# Patient Record
Sex: Female | Born: 1935 | Race: White | Hispanic: No | Smoking: Never smoker
Health system: Southern US, Community
[De-identification: ages and names within clinical notes are randomized; demographics above are authoritative.]

## PROBLEM LIST (undated history)

## (undated) DIAGNOSIS — E079 Disorder of thyroid, unspecified: Secondary | ICD-10-CM

## (undated) DIAGNOSIS — Z950 Presence of cardiac pacemaker: Secondary | ICD-10-CM

## (undated) DIAGNOSIS — F319 Bipolar disorder, unspecified: Secondary | ICD-10-CM

## (undated) DIAGNOSIS — R7989 Other specified abnormal findings of blood chemistry: Secondary | ICD-10-CM

## (undated) DIAGNOSIS — F329 Major depressive disorder, single episode, unspecified: Secondary | ICD-10-CM

## (undated) DIAGNOSIS — J449 Chronic obstructive pulmonary disease, unspecified: Secondary | ICD-10-CM

## (undated) DIAGNOSIS — F419 Anxiety disorder, unspecified: Secondary | ICD-10-CM

## (undated) DIAGNOSIS — F32A Depression, unspecified: Secondary | ICD-10-CM

## (undated) HISTORY — PX: OTHER SURGICAL HISTORY: SHX169

## (undated) HISTORY — DX: Bipolar disorder, unspecified: F31.9

## (undated) HISTORY — DX: Presence of cardiac pacemaker: Z95.0

## (undated) HISTORY — DX: Other specified abnormal findings of blood chemistry: R79.89

---
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
## 2019-10-08 DIAGNOSIS — Z9289 Personal history of other medical treatment: Secondary | ICD-10-CM

## 2019-10-08 HISTORY — PX: MRI: SHX5353

## 2019-10-08 HISTORY — DX: Personal history of other medical treatment: Z92.89

## 2020-04-18 ENCOUNTER — Ambulatory Visit
Admission: RE | Admit: 2020-04-18 | Discharge: 2020-04-18 | Disposition: A | Discharge status: Home / Self Care | Payer: Medicare HMO | Source: Ambulatory Visit | Attending: Family Medicine | Admitting: Family Medicine

## 2020-04-18 ENCOUNTER — Other Ambulatory Visit: Payer: Self-pay | Admitting: Family Medicine

## 2020-04-18 ENCOUNTER — Other Ambulatory Visit: Payer: Self-pay

## 2020-04-18 ENCOUNTER — Other Ambulatory Visit (HOSPITAL_COMMUNITY): Payer: Self-pay | Admitting: Family Medicine

## 2020-04-18 DIAGNOSIS — R0600 Dyspnea, unspecified: Secondary | ICD-10-CM

## 2020-05-08 ENCOUNTER — Other Ambulatory Visit (HOSPITAL_COMMUNITY): Payer: Self-pay | Admitting: Family Medicine

## 2020-05-08 ENCOUNTER — Ambulatory Visit (HOSPITAL_COMMUNITY)
Admission: RE | Admit: 2020-05-08 | Discharge: 2020-05-08 | Disposition: A | Discharge status: Home / Self Care | Payer: Medicare HMO | Source: Ambulatory Visit | Attending: Cardiology | Admitting: Cardiology

## 2020-05-08 ENCOUNTER — Other Ambulatory Visit: Payer: Self-pay

## 2020-05-08 DIAGNOSIS — R7989 Other specified abnormal findings of blood chemistry: Secondary | ICD-10-CM | POA: Insufficient documentation

## 2020-05-08 DIAGNOSIS — R0602 Shortness of breath: Secondary | ICD-10-CM | POA: Insufficient documentation

## 2020-05-16 ENCOUNTER — Other Ambulatory Visit: Payer: Self-pay

## 2020-05-16 ENCOUNTER — Ambulatory Visit (HOSPITAL_COMMUNITY): Payer: Medicare HMO | Attending: Cardiology

## 2020-05-16 DIAGNOSIS — R0600 Dyspnea, unspecified: Secondary | ICD-10-CM

## 2020-05-16 LAB — ECHOCARDIOGRAM COMPLETE
Area-P 1/2: 3.91 cm2
P 1/2 time: 486 msec
S' Lateral: 2.8 cm

## 2020-05-19 ENCOUNTER — Emergency Department (HOSPITAL_COMMUNITY)
Admission: EM | Admit: 2020-05-19 | Discharge: 2020-05-19 | Disposition: A | Discharge status: Home / Self Care | Payer: Medicare HMO | Attending: Emergency Medicine | Admitting: Emergency Medicine

## 2020-05-19 ENCOUNTER — Other Ambulatory Visit: Payer: Self-pay

## 2020-05-19 ENCOUNTER — Emergency Department (HOSPITAL_COMMUNITY)
Admission: EM | Admit: 2020-05-19 | Discharge: 2020-05-19 | Disposition: A | Discharge status: Home / Self Care | Payer: Medicare HMO | Source: Home / Self Care

## 2020-05-19 ENCOUNTER — Encounter (HOSPITAL_COMMUNITY): Payer: Self-pay

## 2020-05-19 ENCOUNTER — Emergency Department (HOSPITAL_COMMUNITY): Payer: Medicare HMO

## 2020-05-19 DIAGNOSIS — R0602 Shortness of breath: Secondary | ICD-10-CM | POA: Insufficient documentation

## 2020-05-19 DIAGNOSIS — Z5321 Procedure and treatment not carried out due to patient leaving prior to being seen by health care provider: Secondary | ICD-10-CM | POA: Insufficient documentation

## 2020-05-19 HISTORY — DX: Depression, unspecified: F32.A

## 2020-05-19 HISTORY — DX: Anxiety disorder, unspecified: F41.9

## 2020-05-19 LAB — BASIC METABOLIC PANEL
Anion gap: 12 (ref 5–15)
BUN: 13 mg/dL (ref 8–23)
CO2: 19 mmol/L — ABNORMAL LOW (ref 22–32)
Calcium: 9 mg/dL (ref 8.9–10.3)
Chloride: 98 mmol/L (ref 98–111)
Creatinine, Ser: 0.76 mg/dL (ref 0.44–1.00)
GFR calc Af Amer: >60 mL/min (ref >60)
GFR calc non Af Amer: >60 mL/min (ref >60)
Glucose, Bld: 122 mg/dL — ABNORMAL HIGH (ref 70–99)
Potassium: 4.4 mmol/L (ref 3.5–5.1)
Sodium: 129 mmol/L — ABNORMAL LOW (ref 135–145)

## 2020-05-19 NOTE — ED Triage Notes (Signed)
Patient ac/o SOB x 2 months. Patient was at [facility] ED and left today. Patient states she has had tests completed by her PCP and they can not find out why she is SOB. Patient states SOB worse in the past 2 weeks.

## 2020-05-19 NOTE — ED Triage Notes (Signed)
Pt arrives POV for eval of shortness of breath x 2 months. Pt reports she called MD today, who advised her to come here. Pt reports PCP has run "a whole bunch of tests" and everything was normal. Pt reports she just continues to feel worse.

## 2020-06-08 ENCOUNTER — Ambulatory Visit (HOSPITAL_COMMUNITY)
Admission: EM | Admit: 2020-06-08 | Discharge: 2020-06-08 | Disposition: A | Discharge status: Home / Self Care | Payer: Medicare HMO | Attending: Family Medicine | Admitting: Family Medicine

## 2020-06-08 ENCOUNTER — Other Ambulatory Visit: Payer: Self-pay

## 2020-06-08 DIAGNOSIS — R0602 Shortness of breath: Secondary | ICD-10-CM

## 2020-06-08 MED ORDER — PREDNISONE 10 MG PO TABS
20.0000 mg | ORAL_TABLET | Freq: Every day | ORAL | 0 refills | Status: AC
Start: 2020-06-08 — End: 2020-06-13

## 2020-06-08 MED ORDER — PREDNISONE 10 MG PO TABS
20.0000 mg | ORAL_TABLET | Freq: Every day | ORAL | 0 refills | Status: DC
Start: 2020-06-08 — End: 2020-06-08

## 2020-06-08 MED ORDER — ALBUTEROL SULFATE HFA 108 (90 BASE) MCG/ACT IN AERS: 1.0000 | INHALATION_SPRAY | Freq: Four times a day (QID) | RESPIRATORY_TRACT | 0 refills | Status: DC | PRN

## 2020-06-08 NOTE — ED Triage Notes (Signed)
Pt presents with complaints of shortness of breath x 2 months. States she is fatigued and having difficulty sleeping at night. Shortness of breath is worse with ambulation. Reports she has seen her provider and has had a chest x ray, breathing treatments, and checked for blood clots with no improvement. Pt denies any pain.

## 2020-06-08 NOTE — Discharge Instructions (Addendum)
Medication as prescribed  Follow up next week for CT scan

## 2020-06-10 NOTE — ED Provider Notes (Signed)
[facility]    CSN: [id number] Arrival date & time: 06/08/20  1258      History   Chief Complaint Chief Complaint  Patient presents with  . Shortness of Breath    HPI Sheila Mccoy is a 86 y.o. female.   Pt is a 86 year old female that presents today with continued shortness of breath this is been ongoing issue for approximate 2 months.  Feels like the problem is worsening.  She has had work-up to include echocardiogram, EKG, pulmonary function test and is currently seeing pulmonologist.  She has been given albuterol and steroids which seems to help some.  She is scheduled for CT angio to rule out PE next week.  Denies any chest pain, cough or fevers.  Currently taking Lasix daily which has improved her lower extremity edema.  Currently without swelling.  No calf pain or swelling.  Also has chronic DVT     Past Medical History:  Diagnosis Date  . Anxiety   . Depression     There are no problems to display for this patient.   Past Surgical History:  Procedure Laterality Date  . arm fracture surgery      OB History   No obstetric history on file.      Home Medications    Prior to Admission medications   Medication Sig Start Date End Date Taking? Authorizing Provider  albuterol (VENTOLIN HFA) 108 (90 Base) MCG/ACT inhaler Inhale 1-2 puffs into the lungs every 6 (six) hours as needed for wheezing or shortness of breath. 06/08/20   Dahlia Byes A, NP  predniSONE (DELTASONE) 10 MG tablet Take 2 tablets (20 mg total) by mouth daily for 5 days. 06/08/20 06/13/20  Janace Aris, NP    Family History No family history on file.  Social History Social History   Tobacco Use  . Smoking status: Never Smoker  . Smokeless tobacco: Never Used  Vaping Use  . Vaping Use: Never used  Substance Use Topics  . Alcohol use: Never  . Drug use: Never     Allergies   Patient has no known allergies.   Review of Systems Review of Systems   Physical Exam Triage Vital  Signs ED Triage Vitals  Enc Vitals Group     BP 06/08/20 1303 (!) 119/91     Pulse Rate 06/08/20 1303 60     Resp 06/08/20 1303 18     Temp 06/08/20 1303 98.1 F (36.7 C)     Temp src --      SpO2 06/08/20 1303 96 %     Weight --      Height --      Head Circumference --      Peak Flow --      Pain Score 06/08/20 1423 0     Pain Loc --      Pain Edu? --      Excl. in GC? --    No data found.  Updated Vital Signs BP (!) 119/91   Pulse 60   Temp 98.1 F (36.7 C)   Resp 18   SpO2 96%   Visual Acuity Right Eye Distance:   Left Eye Distance:   Bilateral Distance:    Right Eye Near:   Left Eye Near:    Bilateral Near:     Physical Exam Vitals and nursing note reviewed.  Constitutional:      General: She is not in acute distress.    Appearance: Normal  appearance. She is not ill-appearing, toxic-appearing or diaphoretic.  HENT:     Head: Normocephalic.     Nose: Nose normal.     Mouth/Throat:     Pharynx: Oropharynx is clear.  Eyes:     Conjunctiva/sclera: Conjunctivae normal.  Cardiovascular:     Rate and Rhythm: Normal rate and regular rhythm.  Pulmonary:     Effort: Pulmonary effort is normal.     Breath sounds: Normal breath sounds.  Musculoskeletal:        General: Normal range of motion.     Cervical back: Normal range of motion.  Skin:    General: Skin is warm and dry.     Findings: No rash.  Neurological:     Mental Status: She is alert.  Psychiatric:        Mood and Affect: Mood normal.      UC Treatments / Results  Labs (all labs ordered are listed, but only abnormal results are displayed) Labs Reviewed - No data to display  EKG   Radiology No results found.  Procedures Procedures (including critical care time)  Medications Ordered in UC Medications - No data to display  Initial Impression / Assessment and Plan / UC Course  I have reviewed the triage vital signs and the nursing notes.  Pertinent labs & imaging results that  were available during my care of the patient were reviewed by me and considered in my medical decision making (see chart for details).     SOB This is chronic and she is currently being worked up for this.  Previous lab work reviewed and revealed mildly elevated white blood cell count of 12 with elevated monocyte and eosinophil Not concerned of any bacterial infection or pneumonia at this time. Previous chest x-ray with pleural effusion.  No crackles on exam. She is currently taking Lasix which seems to help. Patient has plans next week to have CT angio of chest.  Otherwise oxygen saturations are normal and she is not tachycardic at this time. Does not appear to be in any acute distress We will trial round of steroids since this seem to help her previously.  Albuterol inhaler as needed. Recommend follow-up next week as planned Final Clinical Impressions(s) / UC Diagnoses   Final diagnoses:  SOB (shortness of breath)     Discharge Instructions     Medication as prescribed  Follow up next week for CT scan       ED Prescriptions    Medication Sig Dispense Auth. Provider   predniSONE (DELTASONE) 10 MG tablet  (Status: Discontinued) Take 2 tablets (20 mg total) by mouth daily for 5 days. 10 tablet Arbell Wycoff A, NP   albuterol (VENTOLIN HFA) 108 (90 Base) MCG/ACT inhaler  (Status: Discontinued) Inhale 1-2 puffs into the lungs every 6 (six) hours as needed for wheezing or shortness of breath. 1 each Tayon Parekh A, NP   albuterol (VENTOLIN HFA) 108 (90 Base) MCG/ACT inhaler Inhale 1-2 puffs into the lungs every 6 (six) hours as needed for wheezing or shortness of breath. 1 each Maiana Hennigan A, NP   predniSONE (DELTASONE) 10 MG tablet Take 2 tablets (20 mg total) by mouth daily for 5 days. 10 tablet Dahlia Byes A, NP     PDMP not reviewed this encounter.   Dahlia Byes A, NP 06/10/20 1409

## 2020-06-26 ENCOUNTER — Other Ambulatory Visit: Payer: Self-pay | Admitting: Family Medicine

## 2020-06-26 ENCOUNTER — Institutional Professional Consult (permissible substitution): Payer: Medicare HMO | Admitting: Pulmonary Disease

## 2020-06-26 ENCOUNTER — Ambulatory Visit
Admission: RE | Admit: 2020-06-26 | Discharge: 2020-06-26 | Disposition: A | Discharge status: Home / Self Care | Payer: Medicare HMO | Source: Ambulatory Visit | Attending: Family Medicine | Admitting: Family Medicine

## 2020-06-26 DIAGNOSIS — M79604 Pain in right leg: Secondary | ICD-10-CM

## 2020-06-27 ENCOUNTER — Encounter (HOSPITAL_COMMUNITY): Payer: Self-pay | Admitting: *Deleted

## 2020-06-27 NOTE — Progress Notes (Signed)
Received referral notification  from Dr. Avie Echevaria for this pt to participate in pulmonary rehab with the the diagnosis of shortness of breath. Clinical review of pt follow up appt on 8/25  Pulmonary office note.  Pt with Covid Risk Score - 3. Pt appropriate for scheduling for Pulmonary rehab.  Will forward to support staff for scheduling and verification of insurance eligibility/benefits with pt consent. Alanson Aly, BSN Cardiac and Emergency planning/management officer

## 2020-07-04 ENCOUNTER — Other Ambulatory Visit: Payer: Self-pay | Admitting: Family Medicine

## 2020-07-04 DIAGNOSIS — M25551 Pain in right hip: Secondary | ICD-10-CM

## 2020-07-04 DIAGNOSIS — M7918 Myalgia, other site: Secondary | ICD-10-CM

## 2020-07-06 ENCOUNTER — Telehealth (HOSPITAL_COMMUNITY): Payer: Self-pay

## 2020-07-06 NOTE — Telephone Encounter (Signed)
Pt daughter Elenora Fender called and stated pt is interested in PR. Patient will come in for orientation on 08/04/20 @ 130PM and will attend the 10:30AM exercise class. Went over [employer], [name] verbalized understanding.   Mailed letter

## 2020-07-07 ENCOUNTER — Other Ambulatory Visit: Payer: Self-pay | Admitting: Family Medicine

## 2020-07-07 DIAGNOSIS — M545 Low back pain, unspecified: Secondary | ICD-10-CM

## 2020-07-09 ENCOUNTER — Emergency Department (HOSPITAL_BASED_OUTPATIENT_CLINIC_OR_DEPARTMENT_OTHER): Payer: Medicare HMO

## 2020-07-09 ENCOUNTER — Inpatient Hospital Stay (HOSPITAL_BASED_OUTPATIENT_CLINIC_OR_DEPARTMENT_OTHER)
Admission: EM | Admit: 2020-07-09 | Discharge: 2020-07-13 | DRG: 551 | Disposition: A | Discharge status: Skilled Nursing Facility | Payer: Medicare HMO | Attending: Internal Medicine | Admitting: Internal Medicine

## 2020-07-09 ENCOUNTER — Other Ambulatory Visit: Payer: Self-pay

## 2020-07-09 ENCOUNTER — Observation Stay (HOSPITAL_COMMUNITY): Payer: Medicare HMO

## 2020-07-09 ENCOUNTER — Encounter (HOSPITAL_BASED_OUTPATIENT_CLINIC_OR_DEPARTMENT_OTHER): Payer: Self-pay | Admitting: Emergency Medicine

## 2020-07-09 DIAGNOSIS — J90 Pleural effusion, not elsewhere classified: Secondary | ICD-10-CM | POA: Diagnosis not present

## 2020-07-09 DIAGNOSIS — Y9301 Activity, walking, marching and hiking: Secondary | ICD-10-CM | POA: Diagnosis not present

## 2020-07-09 DIAGNOSIS — M25561 Pain in right knee: Secondary | ICD-10-CM

## 2020-07-09 DIAGNOSIS — K5730 Diverticulosis of large intestine without perforation or abscess without bleeding: Secondary | ICD-10-CM | POA: Diagnosis present

## 2020-07-09 DIAGNOSIS — M25562 Pain in left knee: Secondary | ICD-10-CM

## 2020-07-09 DIAGNOSIS — E860 Dehydration: Secondary | ICD-10-CM | POA: Diagnosis not present

## 2020-07-09 DIAGNOSIS — Z7952 Long term (current) use of systemic steroids: Secondary | ICD-10-CM | POA: Diagnosis not present

## 2020-07-09 DIAGNOSIS — N3000 Acute cystitis without hematuria: Secondary | ICD-10-CM | POA: Diagnosis not present

## 2020-07-09 DIAGNOSIS — R0600 Dyspnea, unspecified: Secondary | ICD-10-CM

## 2020-07-09 DIAGNOSIS — Z66 Do not resuscitate: Secondary | ICD-10-CM | POA: Diagnosis present

## 2020-07-09 DIAGNOSIS — W010XXA Fall on same level from slipping, tripping and stumbling without subsequent striking against object, initial encounter: Secondary | ICD-10-CM | POA: Diagnosis present

## 2020-07-09 DIAGNOSIS — F32A Depression, unspecified: Secondary | ICD-10-CM | POA: Diagnosis not present

## 2020-07-09 DIAGNOSIS — N83201 Unspecified ovarian cyst, right side: Secondary | ICD-10-CM | POA: Diagnosis not present

## 2020-07-09 DIAGNOSIS — E039 Hypothyroidism, unspecified: Secondary | ICD-10-CM | POA: Diagnosis present

## 2020-07-09 DIAGNOSIS — S3210XA Unspecified fracture of sacrum, initial encounter for closed fracture: Principal | ICD-10-CM | POA: Diagnosis present

## 2020-07-09 DIAGNOSIS — Z7982 Long term (current) use of aspirin: Secondary | ICD-10-CM | POA: Diagnosis not present

## 2020-07-09 DIAGNOSIS — M25552 Pain in left hip: Secondary | ICD-10-CM | POA: Diagnosis not present

## 2020-07-09 DIAGNOSIS — F419 Anxiety disorder, unspecified: Secondary | ICD-10-CM | POA: Diagnosis not present

## 2020-07-09 DIAGNOSIS — I82503 Chronic embolism and thrombosis of unspecified deep veins of lower extremity, bilateral: Secondary | ICD-10-CM | POA: Diagnosis not present

## 2020-07-09 DIAGNOSIS — G9341 Metabolic encephalopathy: Secondary | ICD-10-CM

## 2020-07-09 DIAGNOSIS — Z20822 Contact with and (suspected) exposure to covid-19: Secondary | ICD-10-CM | POA: Diagnosis present

## 2020-07-09 DIAGNOSIS — N390 Urinary tract infection, site not specified: Secondary | ICD-10-CM

## 2020-07-09 DIAGNOSIS — E440 Moderate protein-calorie malnutrition: Secondary | ICD-10-CM | POA: Diagnosis not present

## 2020-07-09 DIAGNOSIS — Z79890 Hormone replacement therapy (postmenopausal): Secondary | ICD-10-CM | POA: Diagnosis not present

## 2020-07-09 DIAGNOSIS — N949 Unspecified condition associated with female genital organs and menstrual cycle: Secondary | ICD-10-CM

## 2020-07-09 DIAGNOSIS — Z79899 Other long term (current) drug therapy: Secondary | ICD-10-CM

## 2020-07-09 DIAGNOSIS — Z888 Allergy status to other drugs, medicaments and biological substances status: Secondary | ICD-10-CM | POA: Diagnosis not present

## 2020-07-09 DIAGNOSIS — J449 Chronic obstructive pulmonary disease, unspecified: Secondary | ICD-10-CM

## 2020-07-09 HISTORY — DX: Metabolic encephalopathy: G93.41

## 2020-07-09 HISTORY — DX: Unspecified condition associated with female genital organs and menstrual cycle: N94.9

## 2020-07-09 HISTORY — DX: Unspecified fracture of sacrum, initial encounter for closed fracture: S32.10XA

## 2020-07-09 HISTORY — DX: Disorder of thyroid, unspecified: E07.9

## 2020-07-09 HISTORY — DX: Urinary tract infection, site not specified: N39.0

## 2020-07-09 LAB — URINALYSIS, MICROSCOPIC (REFLEX): WBC, UA: >50 WBC/hpf (ref 0–5)

## 2020-07-09 LAB — URINALYSIS, ROUTINE W REFLEX MICROSCOPIC
Bilirubin Urine: NEGATIVE
Glucose, UA: NEGATIVE mg/dL
Ketones, ur: NEGATIVE mg/dL
Nitrite: NEGATIVE
Protein, ur: NEGATIVE mg/dL
Specific Gravity, Urine: 1.01 (ref 1.005–1.030)
pH: 6 (ref 5.0–8.0)

## 2020-07-09 LAB — BASIC METABOLIC PANEL
Anion gap: 11 (ref 5–15)
BUN: 20 mg/dL (ref 8–23)
CO2: 24 mmol/L (ref 22–32)
Calcium: 9.4 mg/dL (ref 8.9–10.3)
Chloride: 99 mmol/L (ref 98–111)
Creatinine, Ser: 0.9 mg/dL (ref 0.44–1.00)
GFR calc Af Amer: >60 mL/min (ref >60)
GFR calc non Af Amer: 57 mL/min — ABNORMAL LOW (ref >60)
Glucose, Bld: 109 mg/dL — ABNORMAL HIGH (ref 70–99)
Potassium: 3.5 mmol/L (ref 3.5–5.1)
Sodium: 134 mmol/L — ABNORMAL LOW (ref 135–145)

## 2020-07-09 LAB — CBC
HCT: 42 % (ref 36.0–46.0)
Hemoglobin: 13.9 g/dL (ref 12.0–15.0)
MCH: 29.9 pg (ref 26.0–34.0)
MCHC: 33.1 g/dL (ref 30.0–36.0)
MCV: 90.3 fL (ref 80.0–100.0)
Platelets: 392 10*3/uL (ref 150–400)
RBC: 4.65 MIL/uL (ref 3.87–5.11)
RDW: 14.8 % (ref 11.5–15.5)
WBC: 9.9 10*3/uL (ref 4.0–10.5)
nRBC: 0 % (ref 0.0–0.2)

## 2020-07-09 LAB — RESPIRATORY PANEL BY RT PCR (FLU A&B, COVID)
Influenza A by PCR: NEGATIVE
Influenza B by PCR: NEGATIVE
SARS Coronavirus 2 by RT PCR: NEGATIVE

## 2020-07-09 MED ORDER — MORPHINE SULFATE (PF) 4 MG/ML IV SOLN
4.0000 mg | Freq: Once | INTRAVENOUS | Status: AC
  Administered 2020-07-09: 4 mg via INTRAVENOUS
  Filled 2020-07-09: qty 1

## 2020-07-09 MED ORDER — METHOCARBAMOL 1000 MG/10ML IJ SOLN
500.0000 mg | Freq: Four times a day (QID) | INTRAVENOUS | Status: DC | PRN
  Filled 2020-07-09: qty 5

## 2020-07-09 MED ORDER — SODIUM CHLORIDE 0.9 % IV SOLN: Freq: Once | INTRAVENOUS | Status: AC

## 2020-07-09 MED ORDER — ENOXAPARIN SODIUM 40 MG/0.4ML ~~LOC~~ SOLN
40.0000 mg | SUBCUTANEOUS | Status: DC
  Administered 2020-07-09 – 2020-07-12 (×4): 40 mg via SUBCUTANEOUS
  Filled 2020-07-09 (×4): qty 0.4

## 2020-07-09 MED ORDER — LEVOTHYROXINE SODIUM 75 MCG PO TABS
75.0000 ug | ORAL_TABLET | Freq: Every day | ORAL | Status: DC
  Administered 2020-07-10 – 2020-07-13 (×4): 75 ug via ORAL
  Filled 2020-07-09 (×4): qty 1

## 2020-07-09 MED ORDER — ONDANSETRON HCL 4 MG/2ML IJ SOLN
4.0000 mg | Freq: Once | INTRAMUSCULAR | Status: AC
  Administered 2020-07-09: 4 mg via INTRAVENOUS
  Filled 2020-07-09: qty 2

## 2020-07-09 MED ORDER — HYDROMORPHONE HCL 1 MG/ML IJ SOLN
0.5000 mg | Freq: Once | INTRAMUSCULAR | Status: AC
  Administered 2020-07-09: 0.5 mg via INTRAVENOUS
  Filled 2020-07-09: qty 1

## 2020-07-09 MED ORDER — IOHEXOL 350 MG/ML SOLN
100.0000 mL | Freq: Once | INTRAVENOUS | Status: AC | PRN
  Administered 2020-07-09: 100 mL via INTRAVENOUS

## 2020-07-09 MED ORDER — SODIUM CHLORIDE 0.9 % IV SOLN
1.0000 g | Freq: Once | INTRAVENOUS | Status: AC
  Administered 2020-07-09: 1 g via INTRAVENOUS
  Filled 2020-07-09: qty 10

## 2020-07-09 MED ORDER — HYDROCODONE-ACETAMINOPHEN 5-325 MG PO TABS
1.0000 | ORAL_TABLET | Freq: Four times a day (QID) | ORAL | Status: DC | PRN
  Administered 2020-07-10: 2 via ORAL
  Filled 2020-07-09 (×2): qty 1

## 2020-07-09 MED ORDER — MORPHINE SULFATE (PF) 2 MG/ML IV SOLN
1.0000 mg | INTRAVENOUS | Status: DC | PRN
  Administered 2020-07-10 – 2020-07-11 (×3): 1 mg via INTRAVENOUS
  Filled 2020-07-09 (×3): qty 1

## 2020-07-09 MED ORDER — SODIUM CHLORIDE 0.9 % IV SOLN: INTRAVENOUS | Status: AC

## 2020-07-09 MED ORDER — SODIUM CHLORIDE 0.9 % IV SOLN
1.0000 g | INTRAVENOUS | Status: DC
  Administered 2020-07-10 – 2020-07-12 (×3): 1 g via INTRAVENOUS
  Filled 2020-07-09: qty 10
  Filled 2020-07-09 (×2): qty 1
  Filled 2020-07-09: qty 10

## 2020-07-09 MED ORDER — ALBUTEROL SULFATE HFA 108 (90 BASE) MCG/ACT IN AERS
1.0000 | INHALATION_SPRAY | Freq: Four times a day (QID) | RESPIRATORY_TRACT | Status: DC | PRN
  Administered 2020-07-10: 1 via RESPIRATORY_TRACT
  Administered 2020-07-10 – 2020-07-11 (×3): 2 via RESPIRATORY_TRACT
  Filled 2020-07-09 (×3): qty 6.7

## 2020-07-09 MED ORDER — METHOCARBAMOL 500 MG PO TABS
500.0000 mg | ORAL_TABLET | Freq: Four times a day (QID) | ORAL | Status: DC | PRN
  Administered 2020-07-10 (×3): 500 mg via ORAL
  Filled 2020-07-09 (×3): qty 1

## 2020-07-09 NOTE — ED Triage Notes (Signed)
Low back and pelvis pain after falling 1 month ago. Pt family states she is spending more and more time in bed and not wanting to move. She states when she tries to get her up she is too weak to walk.

## 2020-07-09 NOTE — ED Notes (Signed)
Spoke with Carol in lab to add on urine culture 

## 2020-07-09 NOTE — ED Notes (Addendum)
Notify radiology when pt done on bed pan

## 2020-07-09 NOTE — ED Notes (Signed)
Pt has need to void, placed on bedpan

## 2020-07-09 NOTE — ED Notes (Signed)
Pt fell at home approx 1 month ago per her family statement, states is very concerned since she is laying in bed mostly and will not get up and ambulate, c/o being weak all the time.

## 2020-07-09 NOTE — ED Notes (Signed)
COVID swab obtained.

## 2020-07-09 NOTE — ED Notes (Addendum)
Pt placed on bedpan, placed in gown, will use call bell when done.

## 2020-07-09 NOTE — ED Notes (Signed)
2 unsuccessful IV attempts, L hand and R hand. RT Brett Canales will attempt

## 2020-07-09 NOTE — ED Notes (Signed)
Patient transported to CT 

## 2020-07-09 NOTE — Progress Notes (Signed)
Pt arrived from [place]. Alert and responsive. Pt yellow MEWS from [place], pt is MEWS green during admission.

## 2020-07-09 NOTE — ED Notes (Signed)
Urine culture sent to lab also.  

## 2020-07-09 NOTE — ED Notes (Signed)
PT BECAME VERY SLEEPY AND CONFUSED, AFTER REC IV DILAUDID, 0.5MG , PT WAS CONFUSED, POX HAD DECREASED TO 86% ON RA, PLACED ON 2LPM VIA Agency, POX HAS NOW INCREASED TO 99-100%. PT WAS REASSURED, ORIENTATION STATUS ASSESSED, WAS ABLE TO STATE NAME, LOCATION AND TIME OF DAY, SAFETY MEASURES FOR HIGH FALL RISK REMAIN IN PLACE, CALL BELL WITHIN REACH, FAMILY AT BEDSIDE. REMAINS ON CONT CARDIAC MONITORING WITH CONT POX.

## 2020-07-09 NOTE — ED Provider Notes (Signed)
[facility] EMERGENCY DEPARTMENT Provider Note   CSN: [id number] Arrival date & time: 07/09/20  1225     History Chief Complaint  Patient presents with  . Back Pain  . Weakness    Noel Journeyleanor Nuckles is a 86 y.o. female with past medical history significant for anxiety, depression, thyroid disease, COPD, chronic DVT.  Not anticoagulated.  Had Covid vaccinations. she is accompanied by her daughter who is contributing historian.  HPI Presents to emergency department today with chief complaint of low back pain and pelvis pain x1 month.  Patient states she had a mechanical fall on 06/13/2020.  She was walking and tripped on her Rollator.  She fell and landed on her left hip.  She had bruising and swelling of her hip immediately after the fall.  Ever since the fall she has had severe pain in her left hip.  The pain is constant.  She describes it as aching.  She rates the pain 8 out of 10 in severity.  She has tried over-the-counter medications without any symptom improvement.  She has seen her primary care doctor and had x-rays of her low back that were negative for fractures.  She was prescribed tramadol which she took for 10 days.  While taking the tramadol she was constipated.  She stopped taking it recently and has since been having diarrhea.  No blood in her stool.  2 episodes of diarrhea in the last 24 hours.  No recent antibiotic use, no recent travel or suspicious food intake. Patient admits to urinary frequency and daughter reports mild confusion today which is new. Patient's daughter states that patient has had difficulty walking over the last x3 days.  She has been trying to ambulate with a Rollator however has been unable to because she is so weak and dizzy. She describes the dizziness as the room is spinning.  This morning patient noted to ambulate and was feeling so weak she had to sit back down.  They attempted again to ambulate her and patient was off balance and was pushing her  Rollator into the wall which family says is unusual. Patient is also endorsing shortness of breath x 3 months. She states she is on multiple inhalers and not having significant improvement.  Her shortness of breath has been going on times PA chest on 06/14/2020 was negative for PE, she was noted to have emphysematous changes and fibrosis in the lungs. She denies any fever, chills, cough, hemoptysis, chest pain, abdominal pain, nausea, urinary symptoms, lower extremity edema, numbness, weakness, tingling. Patient lives in a nursing facility.  Her daughter has been staying with her for the last several days as patient is unable to care for herself.    Past Medical History:  Diagnosis Date  . Anxiety   . Depression   . Thyroid disease     Patient Active Problem List   Diagnosis Date Noted  . UTI (urinary tract infection) 07/09/2020    Past Surgical History:  Procedure Laterality Date  . arm fracture surgery       OB History   No obstetric history on file.     No family history on file.  Social History   Tobacco Use  . Smoking status: Never Smoker  . Smokeless tobacco: Never Used  Vaping Use  . Vaping Use: Never used  Substance Use Topics  . Alcohol use: Never  . Drug use: Never    Home Medications Prior to Admission medications   Medication Sig Start Date  End Date Taking? Authorizing Provider  risperiDONE (RISPERDAL) 0.5 MG tablet  05/15/20  Yes [provider]  albuterol (VENTOLIN HFA) 108 (90 Base) MCG/ACT inhaler Inhale 1-2 puffs into the lungs every 6 (six) hours as needed for wheezing or shortness of breath. 06/08/20   Dahlia Byes A, NP  buPROPion (WELLBUTRIN) 75 MG tablet Take by mouth. 06/13/20   [provider]  furosemide (LASIX) 20 MG tablet Take 20 mg by mouth daily. 05/31/20   [provider]  levothyroxine (SYNTHROID) 75 MCG tablet Take by mouth. 06/13/20   [provider]  mirtazapine (REMERON) 15 MG tablet Take by mouth. 06/13/20    [provider]    Allergies    Patient has no known allergies.  Review of Systems   Review of Systems  All other systems are reviewed and are negative for acute change except as noted in the HPI.   Physical Exam Updated Vital Signs BP (!) 106/91 (BP Location: Left Arm)   Pulse 68   Temp 97.7 F (36.5 C) (Oral)   Resp (!) 24   Ht 5\' 3"  (1.6 m)   Wt 63.5 kg   SpO2 93%   BMI 24.80 kg/m   Physical Exam Vitals and nursing note reviewed.  Constitutional:      General: She is not in acute distress.    Appearance: She is not ill-appearing.  HENT:     Head: Normocephalic and atraumatic.     Right Ear: Tympanic membrane and external ear normal.     Left Ear: Tympanic membrane and external ear normal.     Nose: Nose normal.     Mouth/Throat:     Mouth: Mucous membranes are moist.     Pharynx: Oropharynx is clear.  Eyes:     General: No scleral icterus.       Right eye: No discharge.        Left eye: No discharge.     Extraocular Movements: Extraocular movements intact.     Conjunctiva/sclera: Conjunctivae normal.     Pupils: Pupils are equal, round, and reactive to light.  Neck:     Vascular: No JVD.  Cardiovascular:     Rate and Rhythm: Normal rate and regular rhythm.     Pulses: Normal pulses.          Radial pulses are 2+ on the right side and 2+ on the left side.     Heart sounds: Normal heart sounds.  Pulmonary:     Comments: Lungs clear to auscultation in all fields. Symmetric chest rise. No wheezing, rales, or rhonchi.  Oxygen saturation is 93% on room air. Abdominal:     Comments: Abdomen is soft, non-distended, and non-tender in all quadrants. No rigidity, no guarding. No peritoneal signs.  Musculoskeletal:     Cervical back: Normal range of motion.     Right lower leg: No edema.     Left lower leg: No edema.     Comments: Tenderness to palpation of left hip.  On deep palpation there is a not felt.  No overlying skin changes, no ecchymosis.   Decreased range of motion of left hip secondary to pain.  Full range of motion of bilateral knees and ankles.  Compartments in left lower extremity are soft.  Pelvis is stable.  No leg length discrepancies.   Skin:    General: Skin is warm and dry.     Capillary Refill: Capillary refill takes less than 2 seconds.  Neurological:  Mental Status: She is oriented to person, place, and time.     GCS: GCS eye subscore is 4. GCS verbal subscore is 5. GCS motor subscore is 6.     Comments: Speech is clear and goal oriented, follows commands CN III-XII intact, no facial droop Normal strength in upper and lower extremities bilaterally including dorsiflexion and plantar flexion, strong and equal grip strength Sensation normal to light and sharp touch Moves extremities without ataxia, coordination intact Normal finger to nose and rapid alternating movements  Unable to ambulate s/2 weakness and pain per patient   Psychiatric:        Behavior: Behavior normal.     ED Results / Procedures / Treatments   Labs (all labs ordered are listed, but only abnormal results are displayed) Labs Reviewed  BASIC METABOLIC PANEL - Abnormal; Notable for the following components:      Result Value   Sodium 134 (*)    Glucose, Bld 109 (*)    GFR calc non Af Amer 57 (*)    All other components within normal limits  URINALYSIS, ROUTINE W REFLEX MICROSCOPIC - Abnormal; Notable for the following components:   Color, Urine STRAW (*)    APPearance CLOUDY (*)    Hgb urine dipstick TRACE (*)    Leukocytes,Ua LARGE (*)    All other components within normal limits  URINALYSIS, MICROSCOPIC (REFLEX) - Abnormal; Notable for the following components:   Bacteria, UA MANY (*)    All other components within normal limits  RESPIRATORY PANEL BY RT PCR (FLU A&B, COVID)  URINE CULTURE  CBC    EKG None  Radiology CT Head Wo Contrast  Result Date: 07/09/2020 CLINICAL DATA:  Status post fall 1 month ago. EXAM: CT HEAD  WITHOUT CONTRAST TECHNIQUE: Contiguous axial images were obtained from the base of the skull through the vertex without intravenous contrast. COMPARISON:  None. FINDINGS: Brain: There is mild cerebral atrophy with widening of the extra-axial spaces and ventricular dilatation. There are areas of decreased attenuation within the white matter tracts of the supratentorial brain, consistent with microvascular disease changes. Vascular: No hyperdense vessel or unexpected calcification. Skull: Normal. Negative for fracture or focal lesion. Sinuses/Orbits: No acute finding. Other: None. IMPRESSION: 1. Generalized cerebral atrophy. 2. No acute intracranial abnormality. Electronically Signed   By: Aram Candela M.D.   On: 07/09/2020 16:03   CT Lumbar Spine Wo Contrast  Result Date: 07/09/2020 CLINICAL DATA:  Status post fall 1 month ago. EXAM: CT LUMBAR SPINE WITHOUT CONTRAST TECHNIQUE: Multidetector CT imaging of the lumbar spine was performed without intravenous contrast administration. Multiplanar CT image reconstructions were also generated. COMPARISON:  None. FINDINGS: Segmentation: 5 lumbar type vertebrae. Alignment: There is moderate severity levoscoliosis. Vertebrae: A chronic compression fracture deformity is seen involving predominantly the inferior endplate of the L1 vertebral body. Paraspinal and other soft tissues: Negative. Disc levels: Moderate severity multilevel endplate sclerosis is seen throughout the lumbar spine. Moderate severity multilevel intervertebral disc space narrowing is seen with associated vacuum disc phenomenon noted at the levels of L1-L2, L2-L3, L3-L4 and L4-L5. Mild to moderate severity bilateral multilevel facet joint hypertrophy is noted. IMPRESSION: 1. Chronic compression fracture deformity involving predominantly the inferior endplate of the L1 vertebral body. 2. Moderate severity levoscoliosis. 3. Moderate to marked severity multilevel degenerative disc disease. Electronically  Signed   By: Aram Candela M.D.   On: 07/09/2020 16:13   CT PELVIS WO CONTRAST  Result Date: 07/09/2020 CLINICAL DATA:  Status post fall  1 month ago. EXAM: CT PELVIS WITHOUT CONTRAST TECHNIQUE: Multidetector CT imaging of the pelvis was performed following the standard protocol without intravenous contrast. COMPARISON:  None. FINDINGS: Urinary Tract:  The urinary bladder is markedly distended. Bowel: There is no evidence of bowel dilatation. Noninflamed diverticula are seen throughout the large bowel. Vascular/Lymphatic: There is moderate severity calcification of the abdominal aorta and bilateral common iliac arteries, without evidence of aneurysmal dilatation. Reproductive: The uterus is normal in size and appearance. A 7.2 cm x 3.3 cm cystic appearing area is seen within the right adnexa. Mass effect is seen on the uterus. Other:  None. Musculoskeletal: An acute mildly displaced fracture is seen involving the inferior aspect of the sacrum, at the approximate level of S5. IMPRESSION: 1. Acute fracture involving the inferior aspect of the sacrum, at the approximate level of S5. 2. Large right adnexal cyst, likely ovarian in origin. Further evaluation with pelvic ultrasound is recommended. 3. Colonic diverticulosis. Aortic Atherosclerosis (ICD10-I70.0). Electronically Signed   By: Aram Candela M.D.   On: 07/09/2020 16:09   DG Chest Portable 1 View  Result Date: 07/09/2020 CLINICAL DATA:  Shortness of breath. EXAM: PORTABLE CHEST 1 VIEW COMPARISON:  May 19, 2020. FINDINGS: The heart size and mediastinal contours are within normal limits. No pneumothorax is noted. Right lung is clear. Mild left basilar subsegmental atelectasis and left pleural effusion is noted. The visualized skeletal structures are unremarkable. IMPRESSION: Mild left basilar subsegmental atelectasis and left pleural effusion. Electronically Signed   By: Lupita Raider M.D.   On: 07/09/2020 15:46    Procedures Procedures  (including critical care time)  Medications Ordered in ED Medications  cefTRIAXone (ROCEPHIN) 1 g in sodium chloride 0.9 % 100 mL IVPB (1 g Intravenous New Bag/Given 07/09/20 1724)  0.9 %  sodium chloride infusion (has no administration in time range)  morphine 4 MG/ML injection 4 mg (4 mg Intravenous Given 07/09/20 1721)  ondansetron (ZOFRAN) injection 4 mg (4 mg Intravenous Given 07/09/20 1720)    ED Course  I have reviewed the triage vital signs and the nursing notes.  Pertinent labs & imaging results that were available during my care of the patient were reviewed by me and considered in my medical decision making (see chart for details).    MDM Rules/Calculators/A&P                          History provided by patient with additional history obtained from chart review.    48 old female presenting with progressive weakness after fall x1 month ago and shortness of breath x3 months.  Patient is afebrile, normotensive, no tachycardia or hypoxia.  On exam patient looks to not feel well however is not toxic in appearance.  She has normal work of breathing and lungs are clear to auscultation all fields.  She has tenderness palpation of left hip.  Neurovascular intact distally.  Neuro exam without focal weakness.  When trying to ambulate patient she admits to feeling dizzy, weak and has severe pain. Unable to ambulate. Given morphine for pain. Labs were collected in triage.  I viewed results which show overall unremarkable CBC and BMP. UA with possible infection as it has large leukocytes, over 50 WBC and many bacteria. Will send for culture and treat for UTI.Covid and influenza tests are negative. EKG without ischemic changes.  Chart review shows patient had an x-ray of lumbar spine on 06/15/2020, 2 days after fall with impression: Thoracolumbar  scoliosis and disc degenerative disease. Neuroforamen are patent.  Patient's PCP has outpatient MRI was ordered however family felt they could not wait for  this to be scheduled.  We will proceed with CTs at this time as MRI is not available at this facility.  Chest x-ray viewed by me shows left pleural effusion and mild left basilar mental atelectasis.  Compared to chest x-ray on 8/13/121 she had a small left pleural effusion and left lung base atelectasis. CT head without acute intracranial findings.CT lumbar without acute findings. CT Pelvis shows acute fracture involving the inferior aspect of the sacrum, at the approximate level of S5.  Findings and plan of care discussed with supervising physician Dr. Rubin Payor who agrees with plan to admit for UTI and pain management. Patient will likely need PT/OT consult. Spoke with Dr. Jerral Ralph with hospitalist service who agrees to assume care of patient and bring into the hospital for further evaluation and management.     Portions of this note were generated with Scientist, clinical (histocompatibility and immunogenetics). Dictation errors may occur despite best attempts at proofreading.  Final Clinical Impression(s) / ED Diagnoses Final diagnoses:  Acute cystitis without hematuria  Closed fracture of sacrum, unspecified fracture morphology, initial encounter [facility])    Rx / DC Orders ED Discharge Orders    None       Sherene Sires, PA-C 07/09/20 1750    Benjiman Core, MD 07/09/20 2359

## 2020-07-09 NOTE — H&P (Signed)
Sheila Mccoy ZOX:[name]08/17/1932 DOA: 07/09/2020     PCP: Margot Ables, MD   Outpatient Specialists:      Pulmonary   Dr. Eual Fines   Patient arrived to ER on 07/09/20 at 1225 Referred by Attending Therisa Doyne, MD   Patient coming from:   From facility [facility]  Chief Complaint:   Chief Complaint  Patient presents with  . Back Pain  . Weakness    HPI: Sheila Mccoy is a 86 y.o. 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   Presented with   feeling weak, too fatigued to get up.  Had a fall 1 m ago on 13 June 2020 and never fully recoverd that was a mechanical fall she tripped over her Rollator.  Ever after the fall she cannot really walk she has severe pain in her left hip in the groin.  She try to use over-the-counter medications and that seemed to help.  She had seen her primary care provider and had x-rays done which were unremarkable.  She was given tramadol which she took for 10 days but still did not seem to help. After she stopped taking tramadol she has been having some diarrhea.  She had a 2 episodes of the past 24 hours.  No antibiotic use. She has been having more confusion and increased frequency of urination. Patient endorsed that the room was spinning and she feels very lightheaded and dizzy.  She has to sit back down.  Patient has been off balance. She has been also somewhat short of breath but that is been going on for past 3 months.  Does not seem to improve with inhalers. No associated fevers or chills no cough no chest pain no leg edema.   in August she was found to have chronic  DVT in both legs bilateral   Recently right leg gotten more swollen was on blood thinner briefly but Doppler showed no acute DVT so it discontinued and leg edema has resolved.  she was started on Lasix by her pulmonology just to see if that would help with  swelling  Did not help breathing only swelling ECHo done in August showed preserved EF 1.5 m ago she had CTA done  It was negative for PE. Showed emphysema and fibrosis She was startedon Symbacort and that helped But since she has been lying down a lot she  Has more trouble breathing  She was started on spirometer   [id number] Sheila Mccoy Infectious risk factors:  Reports  fatigue   Has   been vaccinated against COVID    Initial COVID TEST  NEGATIVE   Lab Results  Component Value Date   SARSCOV2NAA NEGATIVE 07/09/2020    Regarding pertinent Chronic problems:    leg swelling on Lasix   Hypothyroidism: No results found for: TSH on synthroid    COPD -  followed by pulmonology  not  on baseline oxygen         While in ER: CT head nonacute CT lumbar spine nonacute CT pelvis shows acute fracture involving the inferior aspect of the sacrum Also found to have UTI Urine culture sent She was given IV Dilaudid but became somewhat sleepy.  And her oxygen saturation dropped to 86% on room air she was on 2 L her oxygen saturation went up to 100.  Patient improved and stabilized.   Hospitalist was called for admission for uti and pelvic fracture  The  following Work up has been ordered so far:  Orders Placed This Encounter  Procedures  . Respiratory Panel by RT PCR (Flu A&B, Covid) - Nasopharyngeal Swab  . Urine culture  . DG Chest Portable 1 View  . CT Lumbar Spine Wo Contrast  . CT PELVIS WO CONTRAST  . CT Head Wo Contrast  . Basic metabolic panel  . CBC  . Urinalysis, Routine w reflex microscopic  . Urinalysis, Microscopic (reflex)  . Catherize if unable to void  . Consult to hospitalist  ALL PATIENTS BEING ADMITTED/HAVING PROCEDURES NEED COVID-19 SCREENING  . ED EKG  . EKG 12-Lead  . Place in observation (patient's expected length of stay will be less than 2 midnights)   Following Medications were ordered in ER: Medications  cefTRIAXone (ROCEPHIN) 1 g in sodium chloride 0.9 %  100 mL IVPB (0 g Intravenous Stopped 07/09/20 1841)  morphine 4 MG/ML injection 4 mg (4 mg Intravenous Given 07/09/20 1721)  ondansetron (ZOFRAN) injection 4 mg (4 mg Intravenous Given 07/09/20 1720)  0.9 %  sodium chloride infusion ( Intravenous New Bag/Given 07/09/20 1724)  HYDROmorphone (DILAUDID) injection 0.5 mg (0.5 mg Intravenous Given 07/09/20 1855)        Consult Orders  (From admission, onward)         Start     Ordered   07/09/20 1700  Consult to hospitalist  ALL PATIENTS BEING ADMITTED/HAVING PROCEDURES NEED COVID-19 SCREENING Called [facility] spoke with Ruby  Once       Comments: ALL PATIENTS BEING ADMITTED/HAVING PROCEDURES NEED COVID-19 SCREENING  Provider:  (Not yet assigned)  Question Answer Comment  Place call to: [facility]   Reason for Consult Admit      07/09/20 1659          Significant initial  Findings: Abnormal Labs Reviewed  BASIC METABOLIC PANEL - Abnormal; Notable for the following components:      Result Value   Sodium 134 (*)    Glucose, Bld 109 (*)    GFR calc non Af Amer 57 (*)    All other components within normal limits  URINALYSIS, ROUTINE W REFLEX MICROSCOPIC - Abnormal; Notable for the following components:   Color, Urine STRAW (*)    APPearance CLOUDY (*)    Hgb urine dipstick TRACE (*)    Leukocytes,Ua LARGE (*)    All other components within normal limits  URINALYSIS, MICROSCOPIC (REFLEX) - Abnormal; Notable for the following components:   Bacteria, UA MANY (*)    All other components within normal limits    Otherwise labs showing: Recent Labs  Lab 07/09/20 1302  NA 134*  K 3.5  CO2 24  GLUCOSE 109*  BUN 20  CREATININE 0.90  CALCIUM 9.4  Cr   stable,    Lab Results  Component Value Date   CREATININE 0.90 07/09/2020   CREATININE 0.76 05/19/2020  No results for input(s): AST, ALT, ALKPHOS, BILITOT, PROT, ALBUMIN in the last 168 hours. Lab Results  Component Value Date   CALCIUM 9.4 07/09/2020       Component  Value Date/Time   WBC 9.9 07/09/2020 1302   Plt: Lab Results  Component Value Date   PLT 392 07/09/2020    HG/HCT  stable,       Component Value Date/Time   HGB 13.9 07/09/2020 1302   HCT 42.0 07/09/2020 1302   MCV 90.3 07/09/2020 1302     ECG: Ordered Personally reviewed by me showing: HR : 63  Rhythm:  RBBB,    no evidence of ischemic changes QTC 467     UA   evidence of UTI     Urine analysis:    Component Value Date/Time   COLORURINE STRAW (A) 07/09/2020 1455   APPEARANCEUR CLOUDY (A) 07/09/2020 1455   LABSPEC 1.010 07/09/2020 1455   PHURINE 6.0 07/09/2020 1455   GLUCOSEU NEGATIVE 07/09/2020 1455   HGBUR TRACE (A) 07/09/2020 1455   BILIRUBINUR NEGATIVE 07/09/2020 1455   KETONESUR NEGATIVE 07/09/2020 1455   PROTEINUR NEGATIVE 07/09/2020 1455   NITRITE NEGATIVE 07/09/2020 1455   LEUKOCYTESUR LARGE (A) 07/09/2020 1455    Ordered  CT HEAD   NON acute  CXR - atelectasis  CTabd/pelvis - S5 fracture and Large adnexal Cyst    ED Triage Vitals  Enc Vitals Group     BP 07/09/20 1241 (!) 106/91     Pulse Rate 07/09/20 1241 68     Resp 07/09/20 1241 (!) 24     Temp 07/09/20 1241 97.7 F (36.5 C)     Temp Source 07/09/20 1241 Oral     SpO2 07/09/20 1241 93 %     Weight 07/09/20 1239 140 lb (63.5 kg)     Height 07/09/20 1239 5\' 3"  (1.6 m)     Head Circumference --      Peak Flow --      Pain Score 07/09/20 1239 9     Pain Loc --      Pain Edu? --      Excl. in GC? --   TMAX(24)@       Latest  Blood pressure (!) 155/83, pulse 66, temperature 98.1 F (36.7 C), temperature source Oral, resp. rate 18, height 5\' 3"  (1.6 m), weight 63.5 kg, SpO2 98 %.    Review of Systems:    Pertinent positives include: fatigue  urgency or frequency.  Constitutional:  No weight loss, night sweats, Fevers, chills,, weight loss  HEENT:  No headaches, Difficulty swallowing,Tooth/dental problems,Sore throat,  No sneezing, itching, ear ache, nasal congestion, post nasal drip,   Cardio-vascular:  No chest pain, Orthopnea, PND, anasarca, dizziness, palpitations.no Bilateral lower extremity swelling  GI:  No heartburn, indigestion, abdominal pain, nausea, vomiting, diarrhea, change in bowel habits, loss of appetite, melena, blood in stool, hematemesis Resp:  no shortness of breath at rest. No dyspnea on exertion, No excess mucus, no productive cough, No non-productive cough, No coughing up of blood.No change in color of mucus.No wheezing. Skin:  no rash or lesions. No jaundice GU:  no dysuria, change in color of urine, no No straining to urinate.  No flank pain.  Musculoskeletal:  No joint pain or no joint swelling. No decreased range of motion. No back pain.  Psych:  No change in mood or affect. No depression or anxiety. No memory loss.  Neuro: no localizing neurological complaints, no tingling, no weakness, no double vision, no gait abnormality, no slurred speech, no confusion  All systems reviewed and apart from HOPI all are negative  Past Medical History:   Past Medical History:  Diagnosis Date  . Anxiety   . Depression   . Thyroid disease       Past Surgical History:  Procedure Laterality Date  . arm fracture surgery      Social History:  Ambulatory   walker      reports that she has never smoked. She has never used smokeless tobacco. She reports that she does not drink alcohol and does not use drugs.  Family History:   Family History  Problem Relation Age of Onset  . Lung cancer Mother     Allergies: No Known Allergies   Prior to Admission medications   Medication Sig Start Date End Date Taking? Authorizing Provider  risperiDONE (RISPERDAL) 0.5 MG tablet  05/15/20  Yes [provider]  albuterol (VENTOLIN HFA) 108 (90 Base) MCG/ACT inhaler Inhale 1-2 puffs into the lungs every 6 (six) hours as needed for wheezing or shortness of breath. 06/08/20   Dahlia Byes A, NP  buPROPion (WELLBUTRIN) 75 MG tablet Take by mouth. 06/13/20    [provider]  furosemide (LASIX) 20 MG tablet Take 20 mg by mouth daily. 05/31/20   [provider]  levothyroxine (SYNTHROID) 75 MCG tablet Take by mouth. 06/13/20   [provider]  mirtazapine (REMERON) 15 MG tablet Take by mouth. 06/13/20   [provider]   Physical Exam: Vitals with BMI 07/09/2020 07/09/2020 07/09/2020  Height - - -  Weight - - -  BMI - - -  Systolic 155 142 161  Diastolic 83 99 85  Pulse 66 72 70   1. General:  in No   Acute distress    Chronically ill  -appearing 2. Psychological: Alert and Oriented 3. Head/ENT:    Dry Mucous Membranes                          Head Non traumatic, neck supple                           Poor Dentition 4. SKIN: decreased Skin turgor,  Skin clean Dry and intact no rash 5. Heart: Regular rate and rhythm no  Murmur, no Rub or gallop 6. Lungs:  no wheezes or crackles   7. Abdomen: Soft,  non-tender, Non distended  Obese bowel sounds present 8. Lower extremities: no clubbing, cyanosis, no  edema 9. Neurologically Grossly intact, moving all 4 extremities equally  10. MSK: Normal range of motion  All other LABS:     Recent Labs  Lab 07/09/20 1302  WBC 9.9  HGB 13.9  HCT 42.0  MCV 90.3  PLT 392     Recent Labs  Lab 07/09/20 1302  NA 134*  K 3.5  CL 99  CO2 24  GLUCOSE 109*  BUN 20  CREATININE 0.90  CALCIUM 9.4     No results for input(s): AST, ALT, ALKPHOS, BILITOT, PROT, ALBUMIN in the last 168 hours.     Cultures: No results found for: SDES, SPECREQUEST, CULT, REPTSTATUS   Radiological Exams on Admission: CT Head Wo Contrast  Result Date: 07/09/2020 CLINICAL DATA:  Status post fall 1 month ago. EXAM: CT HEAD WITHOUT CONTRAST TECHNIQUE: Contiguous axial images were obtained from the base of the skull through the vertex without intravenous contrast. COMPARISON:  None. FINDINGS: Brain: There is mild cerebral atrophy with widening of the extra-axial spaces and ventricular  dilatation. There are areas of decreased attenuation within the white matter tracts of the supratentorial brain, consistent with microvascular disease changes. Vascular: No hyperdense vessel or unexpected calcification. Skull: Normal. Negative for fracture or focal lesion. Sinuses/Orbits: No acute finding. Other: None. IMPRESSION: 1. Generalized cerebral atrophy. 2. No acute intracranial abnormality. Electronically Signed   By: Aram Candela M.D.   On: 07/09/2020 16:03   CT Lumbar Spine Wo Contrast  Result Date: 07/09/2020 CLINICAL DATA:  Status post fall 1 month ago.  EXAM: CT LUMBAR SPINE WITHOUT CONTRAST TECHNIQUE: Multidetector CT imaging of the lumbar spine was performed without intravenous contrast administration. Multiplanar CT image reconstructions were also generated. COMPARISON:  None. FINDINGS: Segmentation: 5 lumbar type vertebrae. Alignment: There is moderate severity levoscoliosis. Vertebrae: A chronic compression fracture deformity is seen involving predominantly the inferior endplate of the L1 vertebral body. Paraspinal and other soft tissues: Negative. Disc levels: Moderate severity multilevel endplate sclerosis is seen throughout the lumbar spine. Moderate severity multilevel intervertebral disc space narrowing is seen with associated vacuum disc phenomenon noted at the levels of L1-L2, L2-L3, L3-L4 and L4-L5. Mild to moderate severity bilateral multilevel facet joint hypertrophy is noted. IMPRESSION: 1. Chronic compression fracture deformity involving predominantly the inferior endplate of the L1 vertebral body. 2. Moderate severity levoscoliosis. 3. Moderate to marked severity multilevel degenerative disc disease. Electronically Signed   By: Aram Candela M.D.   On: 07/09/2020 16:13   CT PELVIS WO CONTRAST  Result Date: 07/09/2020 CLINICAL DATA:  Status post fall 1 month ago. EXAM: CT PELVIS WITHOUT CONTRAST TECHNIQUE: Multidetector CT imaging of the pelvis was performed following  the standard protocol without intravenous contrast. COMPARISON:  None. FINDINGS: Urinary Tract:  The urinary bladder is markedly distended. Bowel: There is no evidence of bowel dilatation. Noninflamed diverticula are seen throughout the large bowel. Vascular/Lymphatic: There is moderate severity calcification of the abdominal aorta and bilateral common iliac arteries, without evidence of aneurysmal dilatation. Reproductive: The uterus is normal in size and appearance. A 7.2 cm x 3.3 cm cystic appearing area is seen within the right adnexa. Mass effect is seen on the uterus. Other:  None. Musculoskeletal: An acute mildly displaced fracture is seen involving the inferior aspect of the sacrum, at the approximate level of S5. IMPRESSION: 1. Acute fracture involving the inferior aspect of the sacrum, at the approximate level of S5. 2. Large right adnexal cyst, likely ovarian in origin. Further evaluation with pelvic ultrasound is recommended. 3. Colonic diverticulosis. Aortic Atherosclerosis (ICD10-I70.0). Electronically Signed   By: Aram Candela M.D.   On: 07/09/2020 16:09   DG Chest Portable 1 View  Result Date: 07/09/2020 CLINICAL DATA:  Shortness of breath. EXAM: PORTABLE CHEST 1 VIEW COMPARISON:  May 19, 2020. FINDINGS: The heart size and mediastinal contours are within normal limits. No pneumothorax is noted. Right lung is clear. Mild left basilar subsegmental atelectasis and left pleural effusion is noted. The visualized skeletal structures are unremarkable. IMPRESSION: Mild left basilar subsegmental atelectasis and left pleural effusion. Electronically Signed   By: Lupita Raider M.D.   On: 07/09/2020 15:46    Chart has been reviewed   Assessment/Plan  86 y.o. 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      Admitted for  Sacral fracture, UTI  Present on Admission:  . Acute metabolic encephalopathy -   - most likely  multifactorial secondary to combination of  infection   mild dehydration secondary to decreased by mouth intake,  polypharmacy   - Will rehydrate   - treat underlining infection   - Hold contributing medications   - if no improvement may need further imaging to evaluate for CNS pathology pathology such as MRI of the brain   - neurological exam appears to be nonfocal    - VBG ordered    - no history of liver disease   Dyspnea with chronic DVT -worsening shortness of breath lately we will obtain CTA to rule out Patient  not on anticoagulation CTA neg for PE  . UTI (urinary tract infection) - continue on Rocephin Urine culture pending  . Sacral fracture (HCC) supportive and pain management will need PT OT evaluation and probable placement Patient had poor reaction to Dilaudid given increased sedation will hold off   . COPD (chronic obstructive pulmonary disease) (HCC) -stable continue home medications   . Adnexal cyst -obtain pelvic ultrasound discussed with family,     Other plan as per orders.  DVT prophylaxis:  Lovenox       Code Status:   DNR/DNI  as per patient  And family  I had personally discussed CODE STATUS with patient and family    Family Communication:   Family not at  Bedside  plan of care was discussed on the phone with Daughters   Disposition Plan:   likely will need placement for rehabilitation                           Following barriers for discharge:                                 Pain controlled with PO medications                                                            Will need to be able to tolerate PO                            Would benefit from PT/OT eval prior to DC  Ordered                                     Transition of care consulted                                       Consults called: none  Admission status:  ED Disposition    ED Disposition Condition Comment   Ad[facility]  [id number]  Level  of Care: Med-Surg [16]  Covid Evaluation: Asymptomatic Screening Protocol (No Symptoms)  Diagnosis: UTI (urinary tract infection) [[id number]][[id number]]  Admitting Physician: Dorcas CarrowGHIMIRE, KUBER [[id number]]  Attending Physician: Dorcas CarrowGHIMIRE, KUBER [[id number]]       Obs   Level of care   medical floor     Lab Results  Component Value Date   SARSCOV2NAA NEGATIVE 07/09/2020     Precautions: admitted as Covid Negative     PPE: Used by the provider:   P100  eye Goggles,  Gloves     Selen Smucker 07/10/2020, 12:29 AM    [facility]     after 2 AM please page floor coverage PA If 7AM-7PM, please contact the day team taking care of the patient using [email or web address]   Patient was evaluated in the context of the global COVID-19 pandemic, which necessitated consideration that the patient might be at risk for infection with the SARS-CoV-2 virus that causes COVID-19. Institutional protocols and algorithms that pertain to the evaluation of  patients at risk for COVID-19 are in a state of rapid change based on information released by regulatory bodies including the CDC and federal and state organizations. These policies and algorithms were followed during the patient's care.

## 2020-07-10 DIAGNOSIS — N83201 Unspecified ovarian cyst, right side: Secondary | ICD-10-CM | POA: Diagnosis present

## 2020-07-10 DIAGNOSIS — E860 Dehydration: Secondary | ICD-10-CM | POA: Diagnosis present

## 2020-07-10 DIAGNOSIS — Z79899 Other long term (current) drug therapy: Secondary | ICD-10-CM | POA: Diagnosis not present

## 2020-07-10 DIAGNOSIS — M25552 Pain in left hip: Secondary | ICD-10-CM | POA: Diagnosis present

## 2020-07-10 DIAGNOSIS — Y9301 Activity, walking, marching and hiking: Secondary | ICD-10-CM | POA: Diagnosis present

## 2020-07-10 DIAGNOSIS — F32A Depression, unspecified: Secondary | ICD-10-CM | POA: Diagnosis present

## 2020-07-10 DIAGNOSIS — N390 Urinary tract infection, site not specified: Secondary | ICD-10-CM | POA: Diagnosis present

## 2020-07-10 DIAGNOSIS — F419 Anxiety disorder, unspecified: Secondary | ICD-10-CM | POA: Diagnosis present

## 2020-07-10 DIAGNOSIS — Z20822 Contact with and (suspected) exposure to covid-19: Secondary | ICD-10-CM | POA: Diagnosis present

## 2020-07-10 DIAGNOSIS — Z79890 Hormone replacement therapy (postmenopausal): Secondary | ICD-10-CM | POA: Diagnosis not present

## 2020-07-10 DIAGNOSIS — J90 Pleural effusion, not elsewhere classified: Secondary | ICD-10-CM | POA: Diagnosis present

## 2020-07-10 DIAGNOSIS — S3210XA Unspecified fracture of sacrum, initial encounter for closed fracture: Secondary | ICD-10-CM | POA: Diagnosis present

## 2020-07-10 DIAGNOSIS — N3000 Acute cystitis without hematuria: Secondary | ICD-10-CM | POA: Diagnosis not present

## 2020-07-10 DIAGNOSIS — Z66 Do not resuscitate: Secondary | ICD-10-CM | POA: Diagnosis present

## 2020-07-10 DIAGNOSIS — Z888 Allergy status to other drugs, medicaments and biological substances status: Secondary | ICD-10-CM | POA: Diagnosis not present

## 2020-07-10 DIAGNOSIS — Z7952 Long term (current) use of systemic steroids: Secondary | ICD-10-CM | POA: Diagnosis not present

## 2020-07-10 DIAGNOSIS — W010XXA Fall on same level from slipping, tripping and stumbling without subsequent striking against object, initial encounter: Secondary | ICD-10-CM | POA: Diagnosis present

## 2020-07-10 DIAGNOSIS — E039 Hypothyroidism, unspecified: Secondary | ICD-10-CM | POA: Diagnosis present

## 2020-07-10 DIAGNOSIS — Z7982 Long term (current) use of aspirin: Secondary | ICD-10-CM | POA: Diagnosis not present

## 2020-07-10 DIAGNOSIS — E440 Moderate protein-calorie malnutrition: Secondary | ICD-10-CM | POA: Diagnosis present

## 2020-07-10 DIAGNOSIS — K5730 Diverticulosis of large intestine without perforation or abscess without bleeding: Secondary | ICD-10-CM | POA: Diagnosis present

## 2020-07-10 DIAGNOSIS — N949 Unspecified condition associated with female genital organs and menstrual cycle: Secondary | ICD-10-CM | POA: Diagnosis not present

## 2020-07-10 DIAGNOSIS — G9341 Metabolic encephalopathy: Secondary | ICD-10-CM | POA: Diagnosis present

## 2020-07-10 DIAGNOSIS — J449 Chronic obstructive pulmonary disease, unspecified: Secondary | ICD-10-CM | POA: Diagnosis present

## 2020-07-10 DIAGNOSIS — I82503 Chronic embolism and thrombosis of unspecified deep veins of lower extremity, bilateral: Secondary | ICD-10-CM | POA: Diagnosis present

## 2020-07-10 LAB — URINE CULTURE

## 2020-07-10 LAB — BLOOD GAS, VENOUS
Acid-base deficit: 2.6 mmol/L — ABNORMAL HIGH (ref 0.0–2.0)
Bicarbonate: 22.9 mmol/L (ref 20.0–28.0)
O2 Saturation: 99 %
Patient temperature: 98.6
pCO2, Ven: 44.2 mmHg (ref 44.0–60.0)
pH, Ven: 7.334 (ref 7.250–7.430)
pO2, Ven: 163 mmHg — ABNORMAL HIGH (ref 32.0–45.0)

## 2020-07-10 LAB — TSH: TSH: 2.676 u[IU]/mL (ref 0.350–4.500)

## 2020-07-10 LAB — SARS CORONAVIRUS 2 BY RT PCR (HOSPITAL ORDER, PERFORMED IN ~~LOC~~ HOSPITAL LAB): SARS Coronavirus 2: NEGATIVE

## 2020-07-10 LAB — TYPE AND SCREEN
ABO/RH(D): O POS
Antibody Screen: NEGATIVE

## 2020-07-10 LAB — ABO/RH: ABO/RH(D): O POS

## 2020-07-10 MED ORDER — TRAMADOL HCL 50 MG PO TABS
50.0000 mg | ORAL_TABLET | Freq: Four times a day (QID) | ORAL | Status: DC | PRN
  Administered 2020-07-10 – 2020-07-11 (×2): 50 mg via ORAL
  Filled 2020-07-10 (×2): qty 1

## 2020-07-10 MED ORDER — LATANOPROST 0.005 % OP SOLN
1.0000 [drp] | Freq: Every day | OPHTHALMIC | Status: DC
  Administered 2020-07-10 – 2020-07-12 (×3): 1 [drp] via OPHTHALMIC
  Filled 2020-07-10: qty 2.5

## 2020-07-10 MED ORDER — MOMETASONE FURO-FORMOTEROL FUM 100-5 MCG/ACT IN AERO
2.0000 | INHALATION_SPRAY | Freq: Two times a day (BID) | RESPIRATORY_TRACT | Status: DC
  Administered 2020-07-11 – 2020-07-13 (×5): 2 via RESPIRATORY_TRACT
  Filled 2020-07-10 (×2): qty 8.8

## 2020-07-10 MED ORDER — DORZOLAMIDE HCL-TIMOLOL MAL 2-0.5 % OP SOLN
1.0000 [drp] | Freq: Two times a day (BID) | OPHTHALMIC | Status: DC
  Administered 2020-07-10 – 2020-07-13 (×6): 1 [drp] via OPHTHALMIC
  Filled 2020-07-10: qty 10

## 2020-07-10 MED ORDER — SODIUM CHLORIDE 0.9 % IV SOLN: INTRAVENOUS | Status: DC

## 2020-07-10 MED ORDER — HYDROXYZINE HCL 25 MG PO TABS
25.0000 mg | ORAL_TABLET | Freq: Two times a day (BID) | ORAL | Status: DC | PRN
  Administered 2020-07-10: 25 mg via ORAL
  Filled 2020-07-10: qty 1

## 2020-07-10 MED ORDER — POLYETHYLENE GLYCOL 3350 17 G PO PACK
17.0000 g | PACK | Freq: Every day | ORAL | Status: DC
  Administered 2020-07-10 – 2020-07-13 (×4): 17 g via ORAL
  Filled 2020-07-10 (×4): qty 1

## 2020-07-10 MED ORDER — SENNOSIDES-DOCUSATE SODIUM 8.6-50 MG PO TABS
2.0000 | ORAL_TABLET | Freq: Two times a day (BID) | ORAL | Status: DC
  Administered 2020-07-10 – 2020-07-13 (×6): 2 via ORAL
  Filled 2020-07-10 (×6): qty 2

## 2020-07-10 NOTE — Progress Notes (Signed)
PROGRESS NOTE  Sheila Mccoy NOI:[id number] DOB: 1931-04-04 DOA: 07/09/2020 PCP: Margot Ables, MD  HPI/Recap of past 24 hours: Sheila Mccoy is a 86 y.o. female with medical history significant of anxiety, depression, thyroid disease, COPD,chronic DVT in right leg.  Presented with feeling weak, confusion, dyspnea, too fatigued to get up.  Work-up revealed presumed UTI and hypovolemic hyponatremia.  Due to dyspnea with chronic DVT, a CTA PE was done, it was negative for pulmonary embolism.  07/10/20: Seen and examined at her bedside this morning.  She has no new complaints.  She denies any dysuria, abdominal pain or nausea.   Assessment/Plan: Active Problems:   UTI (urinary tract infection)   Acute metabolic encephalopathy   Sacral fracture (HCC)   COPD (chronic obstructive pulmonary disease) (HCC)   Adnexal cyst  Presumed UTI, POA Presented with urine analysis positive for pyuria Urine culture in process. Started on Rocephin empirically, continue Follow cultures, ID and sensitivities if positive.  Acute metabolic encephalopathy likely multifactorial secondary to UTI, dehydration Continue to treat underlying condition Rehydrate Reorient as needed Fall precautions  Sacral fracture post fall 1 month ago Continue supportive and pain management Fall precautions PT OT evaluation recommends SNF TOC consulted to assist with SNF placement. CT pelvis without contrast shows: 1. Acute fracture involving the inferior aspect of the sacrum, at the approximate level of S5. 2. Large right adnexal cyst, likely ovarian in origin. Further evaluation with pelvic ultrasound is recommended. 3. Colonic diverticulosis.  Dyspnea chronic DVT CTA PE negative for PE. Incentive spirometer Maintain O2 saturation greater than 92%.  Large right adnexal cyst, likely ovarian in origin Pelvis ultrasound done on 07/09/2020 showed: Probable right ovarian cyst as described above. This has  benign characteristics, but given its size, follow up by ultrasound is recommended in 6 months. This recommendation follows consensus guidelines: Simple Adnexal Cysts: SRU Consensus Conference Update on Follow-up and Reporting.  COPD Stable Resume home regimen  Hypothyroidism -TSH Resume home levothyroxine.  DVT prophylaxis:  Lovenox subcu daily      Code Status:   DNR/DNI  as per patient  And family     Family Communication:   Family not at  Bedside   Disposition Plan:   likely will need placement for rehabilitation                           Consults called: none       Status is: Inpatient   Dispo:  Patient From: Assisted Living Facility  Planned Disposition: Skilled Nursing Facility  Expected discharge date: 07/11/20  Medically stable for discharge: No, ongoing management of UTI and sacral fracture.         Objective: Vitals:   07/10/20 1038 07/10/20 1044 07/10/20 1113 07/10/20 1312  BP: (!) 96/45 (!) 88/44 (!) 98/40 (!) 108/56  Pulse: 68   (!) 57  Resp: 18   16  Temp: (!) 97.4 F (36.3 C)   98.1 F (36.7 C)  TempSrc: Axillary   Axillary  SpO2: 94%   (!) 88%  Weight:      Height:        Intake/Output Summary (Last 24 hours) at 07/10/2020 1342 Last data filed at 07/10/2020 1126 Gross per 24 hour  Intake 300 ml  Output 2300 ml  Net -2000 ml   Filed Weights   07/09/20 1239  Weight: 63.5 kg    Exam:  . General: 86 y.o. year-old female well developed well nourished in  no acute distress.  Alert and interactive. . Cardiovascular: Regular rate and rhythm with no rubs or gallops.  No thyromegaly or JVD noted.   Marland Kitchen Respiratory: Clear to auscultation with no wheezes or rales. Good inspiratory effort. . Abdomen: Soft nontender nondistended with normal bowel sounds x4 quadrants. . Musculoskeletal: No lower extremity edema. 2/4 pulses in all 4 extremities. Marland Kitchen Psychiatry: Mood is appropriate for condition and setting   Data Reviewed: CBC: Recent  Labs  Lab 07/09/20 1302  WBC 9.9  HGB 13.9  HCT 42.0  MCV 90.3  PLT 392   Basic Metabolic Panel: Recent Labs  Lab 07/09/20 1302  NA 134*  K 3.5  CL 99  CO2 24  GLUCOSE 109*  BUN 20  CREATININE 0.90  CALCIUM 9.4   GFR: Estimated Creatinine Clearance: 39.5 mL/min (by C-G formula based on SCr of 0.9 mg/dL). Liver Function Tests: No results for input(s): AST, ALT, ALKPHOS, BILITOT, PROT, ALBUMIN in the last 168 hours. No results for input(s): LIPASE, AMYLASE in the last 168 hours. No results for input(s): AMMONIA in the last 168 hours. Coagulation Profile: No results for input(s): INR, PROTIME in the last 168 hours. Cardiac Enzymes: No results for input(s): CKTOTAL, CKMB, CKMBINDEX, TROPONINI in the last 168 hours. BNP (last 3 results) No results for input(s): PROBNP in the last 8760 hours. HbA1C: No results for input(s): HGBA1C in the last 72 hours. CBG: No results for input(s): GLUCAP in the last 168 hours. Lipid Profile: No results for input(s): CHOL, HDL, LDLCALC, TRIG, CHOLHDL, LDLDIRECT in the last 72 hours. Thyroid Function Tests: No results for input(s): TSH, T4TOTAL, FREET4, T3FREE, THYROIDAB in the last 72 hours. Anemia Panel: No results for input(s): VITAMINB12, FOLATE, FERRITIN, TIBC, IRON, RETICCTPCT in the last 72 hours. Urine analysis:    Component Value Date/Time   COLORURINE STRAW (A) 07/09/2020 1455   APPEARANCEUR CLOUDY (A) 07/09/2020 1455   LABSPEC 1.010 07/09/2020 1455   PHURINE 6.0 07/09/2020 1455   GLUCOSEU NEGATIVE 07/09/2020 1455   HGBUR TRACE (A) 07/09/2020 1455   BILIRUBINUR NEGATIVE 07/09/2020 1455   KETONESUR NEGATIVE 07/09/2020 1455   PROTEINUR NEGATIVE 07/09/2020 1455   NITRITE NEGATIVE 07/09/2020 1455   LEUKOCYTESUR LARGE (A) 07/09/2020 1455   Sepsis Labs: @LABRCNTIP (procalcitonin:4,lacticidven:4)  ) Recent Results (from the past 240 hour(s))  Respiratory Panel by RT PCR (Flu A&B, Covid) - Nasopharyngeal Swab     Status: None    Collection Time: 07/09/20  2:37 PM   Specimen: Nasopharyngeal Swab  Result Value Ref Range Status   SARS Coronavirus 2 by RT PCR NEGATIVE NEGATIVE Final    Comment: (NOTE) SARS-CoV-2 target nucleic acids are NOT DETECTED.  The SARS-CoV-2 RNA is generally detectable in upper respiratoy specimens during the acute phase of infection. The lowest concentration of SARS-CoV-2 viral copies this assay can detect is 131 copies/mL. A negative result does not preclude SARS-Cov-2 infection and should not be used as the sole basis for treatment or other patient management decisions. A negative result may occur with  improper specimen collection/handling, submission of specimen other than nasopharyngeal swab, presence of viral mutation(s) within the areas targeted by this assay, and inadequate number of viral copies (<131 copies/mL). A negative result must be combined with clinical observations, patient history, and epidemiological information. The expected result is Negative.  Fact Sheet for Patients:  09/08/20  Fact Sheet for Healthcare Providers:  https://www.moore.com/  This test is no t yet approved or cleared by the https://www.young.biz/ FDA and  has  been authorized for detection and/or diagnosis of SARS-CoV-2 by FDA under an Emergency Use Authorization (EUA). This EUA will remain  in effect (meaning this test can be used) for the duration of the COVID-19 declaration under Section 564(b)(1) of the Act, 21 U.S.C. section 360bbb-3(b)(1), unless the authorization is terminated or revoked sooner.     Influenza A by PCR NEGATIVE NEGATIVE Final   Influenza B by PCR NEGATIVE NEGATIVE Final    Comment: (NOTE) The Xpert Xpress SARS-CoV-2/FLU/RSV assay is intended as an aid in  the diagnosis of influenza from Nasopharyngeal swab specimens and  should not be used as a sole basis for treatment. Nasal washings and  aspirates are unacceptable for Xpert  Xpress SARS-CoV-2/FLU/RSV  testing.  Fact Sheet for Patients: https://www.moore.com/  Fact Sheet for Healthcare Providers: https://www.young.biz/  This test is not yet approved or cleared by the [place] FDA and  has been authorized for detection and/or diagnosis of SARS-CoV-2 by  FDA under an Emergency Use Authorization (EUA). This EUA will remain  in effect (meaning this test can be used) for the duration of the  Covid-19 declaration under Section 564(b)(1) of the Act, 21  U.S.C. section 360bbb-3(b)(1), unless the authorization is  terminated or revoked. Performed at [facility], [address], [place], [place] [address]   Urine culture     Status: Abnormal   Collection Time: 07/09/20  2:55 PM   Specimen: Urine, Random  Result Value Ref Range Status   Specimen Description   Final    URINE, RANDOM Performed at [facility], [address] [place], [place] [address]    Special Requests   Final    NONE Performed at [facility], [address] [place], [place] [address]    Culture MULTIPLE SPECIES PRESENT, SUGGEST RECOLLECTION (A)  Final   Report Status 07/10/2020 FINAL  Final      Studies: CT Head Wo Contrast  Result Date: 07/09/2020 CLINICAL DATA:  Status post fall 1 month ago. EXAM: CT HEAD WITHOUT CONTRAST TECHNIQUE: Contiguous axial images were obtained from the base of the skull through the vertex without intravenous contrast. COMPARISON:  None. FINDINGS: Brain: There is mild cerebral atrophy with widening of the extra-axial spaces and ventricular dilatation. There are areas of decreased attenuation within the white matter tracts of the supratentorial brain, consistent with microvascular disease changes. Vascular: No hyperdense vessel or unexpected calcification. Skull: Normal. Negative for fracture or focal lesion. Sinuses/Orbits: No acute finding. Other: None. IMPRESSION: 1. Generalized  cerebral atrophy. 2. No acute intracranial abnormality. Electronically Signed   By: Aram Candela M.D.   On: 07/09/2020 16:03   CT ANGIO CHEST PE W OR WO CONTRAST  Result Date: 07/09/2020 CLINICAL DATA:  Shortness of breath, PE suspected EXAM: CT ANGIOGRAPHY CHEST WITH CONTRAST TECHNIQUE: Multidetector CT imaging of the chest was performed using the standard protocol during bolus administration of intravenous contrast. Multiplanar CT image reconstructions and MIPs were obtained to evaluate the vascular anatomy. CONTRAST:  OMNIPAQUE IOHEXOL 350 MG/ML SOLN COMPARISON:  June 14, 2020 FINDINGS: Cardiovascular: There is a optimal opacification of the pulmonary arteries. There is no central,segmental, or subsegmental filling defects within the pulmonary arteries. There is mild cardiomegaly present. No pericardial effusion or thickening. No evidence right heart strain. There is normal three-vessel brachiocephalic anatomy without proximal stenosis. Scattered aortic atherosclerosis is noted. Coronary artery calcifications are seen. Mediastinum/Nodes: No hilar, mediastinal, or axillary adenopathy. Thyroid gland, trachea, and esophagus  demonstrate no significant findings. Lungs/Pleura: Calcified granuloma seen at the right lung base. There is a 7 mm ill-defined nodular opacity seen at the left lung apex which is stable from the prior exam, likely focal area of scarring. There is mild streaky airspace opacity seen at both lung bases. No large airspace consolidation or pleural effusion. There is also pleural calcifications seen at the the right lung apex. Upper Abdomen: No acute abnormalities present in the visualized portions of the upper abdomen. Musculoskeletal: No chest wall abnormality. No acute or significant osseous findings. There is a chronic endplate compression deformity of the L1 vertebral body with less than 25% loss in height. Review of the MIP images confirms the above findings. IMPRESSION: No  central, segmental, or subsegmental pulmonary embolism. Mild bibasilar streaky airspace opacity, likely atelectasis or chronic lung changes. No other acute intrathoracic pathology to explain the patient's symptoms. Electronically Signed   By: Jonna Clark M.D.   On: 07/09/2020 23:41   CT Lumbar Spine Wo Contrast  Result Date: 07/09/2020 CLINICAL DATA:  Status post fall 1 month ago. EXAM: CT LUMBAR SPINE WITHOUT CONTRAST TECHNIQUE: Multidetector CT imaging of the lumbar spine was performed without intravenous contrast administration. Multiplanar CT image reconstructions were also generated. COMPARISON:  None. FINDINGS: Segmentation: 5 lumbar type vertebrae. Alignment: There is moderate severity levoscoliosis. Vertebrae: A chronic compression fracture deformity is seen involving predominantly the inferior endplate of the L1 vertebral body. Paraspinal and other soft tissues: Negative. Disc levels: Moderate severity multilevel endplate sclerosis is seen throughout the lumbar spine. Moderate severity multilevel intervertebral disc space narrowing is seen with associated vacuum disc phenomenon noted at the levels of L1-L2, L2-L3, L3-L4 and L4-L5. Mild to moderate severity bilateral multilevel facet joint hypertrophy is noted. IMPRESSION: 1. Chronic compression fracture deformity involving predominantly the inferior endplate of the L1 vertebral body. 2. Moderate severity levoscoliosis. 3. Moderate to marked severity multilevel degenerative disc disease. Electronically Signed   By: Aram Candela M.D.   On: 07/09/2020 16:13   CT PELVIS WO CONTRAST  Result Date: 07/09/2020 CLINICAL DATA:  Status post fall 1 month ago. EXAM: CT PELVIS WITHOUT CONTRAST TECHNIQUE: Multidetector CT imaging of the pelvis was performed following the standard protocol without intravenous contrast. COMPARISON:  None. FINDINGS: Urinary Tract:  The urinary bladder is markedly distended. Bowel: There is no evidence of bowel dilatation.  Noninflamed diverticula are seen throughout the large bowel. Vascular/Lymphatic: There is moderate severity calcification of the abdominal aorta and bilateral common iliac arteries, without evidence of aneurysmal dilatation. Reproductive: The uterus is normal in size and appearance. A 7.2 cm x 3.3 cm cystic appearing area is seen within the right adnexa. Mass effect is seen on the uterus. Other:  None. Musculoskeletal: An acute mildly displaced fracture is seen involving the inferior aspect of the sacrum, at the approximate level of S5. IMPRESSION: 1. Acute fracture involving the inferior aspect of the sacrum, at the approximate level of S5. 2. Large right adnexal cyst, likely ovarian in origin. Further evaluation with pelvic ultrasound is recommended. 3. Colonic diverticulosis. Aortic Atherosclerosis (ICD10-I70.0). Electronically Signed   By: Aram Candela M.D.   On: 07/09/2020 16:09   US PELVIS (TRANSABDOMINAL ONLY)  Result Date: 07/09/2020 CLINICAL DATA:  Adnexal cyst EXAM: TRANSABDOMINAL ULTRASOUND OF PELVIS TECHNIQUE: Transabdominal ultrasound examination of the pelvis was performed including evaluation of the uterus, ovaries, adnexal regions, and pelvic cul-de-sac. COMPARISON:  CT same day FINDINGS: Uterus Measurements: 6.2 x 1.8 x 2.8 cm = volume: 16.7 mL. No  fibroids or other mass visualized. Endometrium Thickness: 1.8 mm.  No focal abnormality visualized. Right ovary Measurements: 8.8 x 4.0 x 7.7 cm = volume: 142 mL. There is an anechoic cystic structures likely seen within the right ovary measuring 8.8 x 4.0 x 7.7 cm. No definite internal vascularity seen. Left ovary Measurements: Not visualized Other findings:  No abnormal free fluid. IMPRESSION: Probable right ovarian cyst as described above. This has benign characteristics, but given its size, follow up by ultrasound is recommended in 6 months. This recommendation follows consensus guidelines: Simple Adnexal Cysts: SRU Consensus Conference  Update on Follow-up and Reporting. Radiology 2019; [id number]. Electronically Signed   By: Jonna ClarkBindu  Avutu M.D.   On: 07/09/2020 22:46   DG Chest Portable 1 View  Result Date: 07/09/2020 CLINICAL DATA:  Shortness of breath. EXAM: PORTABLE CHEST 1 VIEW COMPARISON:  May 19, 2020. FINDINGS: The heart size and mediastinal contours are within normal limits. No pneumothorax is noted. Right lung is clear. Mild left basilar subsegmental atelectasis and left pleural effusion is noted. The visualized skeletal structures are unremarkable. IMPRESSION: Mild left basilar subsegmental atelectasis and left pleural effusion. Electronically Signed   By: Lupita RaiderJames  Green Jr M.D.   On: 07/09/2020 15:46    Scheduled Meds: . enoxaparin (LOVENOX) injection  40 mg Subcutaneous Q24H  . levothyroxine  75 mcg Oral Q0600    Continuous Infusions: . sodium chloride 100 mL/hr at 07/10/20 1323  . cefTRIAXone (ROCEPHIN)  IV    . methocarbamol (ROBAXIN) IV       LOS: 0 days     Darlin Droparole N Nemiah Kissner, MD [facility] Pager [phone]  If 7PM-7AM, please contact night-coverage [email or web address] Password [email or web address] 07/10/2020, 1:42 PM

## 2020-07-10 NOTE — Evaluation (Signed)
Occupational Therapy Evaluation Patient Details Name: Sheila Mccoy MRN: [id number] DOB: 03/26/1931 Today's Date: 07/10/2020    History of Present Illness 86 y.o. 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 Pt found to have UTI and CT pelvis shows acute fracture involving the inferior aspect of the sacrum   Clinical Impression   Pt admitted with UTI. Pt currently with functional limitations due to the deficits listed below (see OT Problem List).  Pt will benefit from skilled OT to increase their safety and independence with ADL and functional mobility for ADL to facilitate discharge to venue listed below.      Follow Up Recommendations  SNF    Equipment Recommendations  None recommended by OT    Recommendations for Other Services       Precautions / Restrictions Precautions Precautions: Fall      Mobility Bed Mobility Overal bed mobility: Needs Assistance Bed Mobility: Supine to Sit     Supine to sit: Min guard;HOB elevated     General bed mobility comments: pt in chair  Transfers Overall transfer level: Needs assistance Equipment used: 1 person hand held assist Transfers: Sit to/from Stand;Stand Pivot Transfers Sit to Stand: Min assist Stand pivot transfers: Min assist       General transfer comment: did not perform    Balance Overall balance assessment: History of Falls                                         ADL either performed or assessed with clinical judgement   ADL Overall ADL's : Needs assistance/impaired Eating/Feeding: Minimal assistance;Sitting   Grooming: Wash/dry face;Oral care;Minimal assistance;Sitting                                 General ADL Comments: limited eval due to fatigue and decreased BP.  OT session shortened as pt needed a new IV for fluids     Vision Patient Visual Report: No change from baseline               Pertinent Vitals/Pain Pain Assessment: 0-10 Pain Score: 3  Pain Location: left hip Pain Descriptors / Indicators: Sore Pain Intervention(s): Limited activity within patient's tolerance     Hand Dominance     Extremity/Trunk Assessment Upper Extremity Assessment Upper Extremity Assessment: Generalized weakness   Lower Extremity Assessment Lower Extremity Assessment: Generalized weakness       Communication Communication Communication: HOH   Cognition Arousal/Alertness: Awake/alert Behavior During Therapy: WFL for tasks assessed/performed Overall Cognitive Status: Within Functional Limits for tasks assessed                                                Home Living Family/patient expects to be discharged to:: Skilled nursing facility Living Arrangements: Alone   Type of Home: Independent living facility                       Home Equipment: Walker - 4 wheels          Prior Functioning/Environment Level of Independence: Independent with assistive device(s)        Comments: mobility has declined over  last 3 weeks after pt had fall and resulting pain        OT Problem List: Decreased strength;Impaired balance (sitting and/or standing);Decreased activity tolerance;Decreased knowledge of use of DME or AE      OT Treatment/Interventions: Self-care/ADL training;Patient/family education;Therapeutic activities;DME and/or AE instruction    OT Goals(Current goals can be found in the care plan section) Acute Rehab OT Goals Patient Stated Goal: get well OT Goal Formulation: With patient Time For Goal Achievement: 07/24/20 Potential to Achieve Goals: Good  OT Frequency: Min 2X/week   Barriers to D/C: Decreased caregiver support             AM-PAC OT "6 Clicks" Daily Activity     Outcome Measure Help from another person eating meals?: A Little Help from another person taking care of personal grooming?: A Little Help from another  person toileting, which includes using toliet, bedpan, or urinal?: A Lot Help from another person bathing (including washing, rinsing, drying)?: A Lot Help from another person to put on and taking off regular upper body clothing?: A Lot Help from another person to put on and taking off regular lower body clothing?: A Lot 6 Click Score: 14   End of Session Nurse Communication: Mobility status  Activity Tolerance: Patient limited by fatigue Patient left: in chair;with call bell/phone within reach;with nursing/sitter in room  OT Visit Diagnosis: Unsteadiness on feet (R26.81);Muscle weakness (generalized) (M62.81);History of falling (Z91.81)                Time: 9373-4287 OT Time Calculation (min): 10 min Charges:  OT General Charges $OT Visit: 1 Visit OT Evaluation $OT Eval Moderate Complexity: 1 Mod  Lise Auer, OT Acute Rehabilitation Services [phone] Office- [phone]     Jeannetta Cerutti, Karin Golden D 07/10/2020, 1:20 PM

## 2020-07-10 NOTE — Evaluation (Signed)
Physical Therapy Evaluation Patient Details Name: Sheila Mccoy MRN: [id number] DOB: 09-Mar-1931 Today's Date: 07/10/2020   History of Present Illness  86 y.o. 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 Pt found to have UTI and CT pelvis shows acute fracture involving the inferior aspect of the sacrum  Clinical Impression  Pt admitted with above diagnosis.  Pt currently with functional limitations due to the deficits listed below (see PT Problem List). Pt will benefit from skilled PT to increase their independence and safety with mobility to allow discharge to the venue listed below.  Pt reports decreased mobility over last 3 weeks after recent fall and reports pain in left hip.  Pt only felt able to transfer today and reports 5/10 left hip pain with standing.  Pt from ILF and agreeable for SNF upon d/c.     Follow Up Recommendations SNF    Equipment Recommendations  None recommended by PT    Recommendations for Other Services       Precautions / Restrictions Precautions Precautions: Fall      Mobility  Bed Mobility Overal bed mobility: Needs Assistance Bed Mobility: Supine to Sit     Supine to sit: Min guard;HOB elevated     General bed mobility comments: cues for technique to decrease pain  Transfers Overall transfer level: Needs assistance Equipment used: 1 person hand held assist Transfers: Sit to/from [employer] Sit to Stand: Min assist Stand pivot transfers: Min assist       General transfer comment: slight assist to rise and steady, pt reached for armrest and provided HHA support, pt reports 5/10 left hip pain with standing and only felt able to transfer today  Ambulation/Gait                Stairs            Wheelchair Mobility    Modified Rankin (Stroke Patients Only)       Balance Overall balance assessment: History of Falls                                            Pertinent Vitals/Pain Pain Assessment: 0-10 Pain Score: 5  Pain Location: left hip Pain Descriptors / Indicators: Grimacing;Discomfort;Sore Pain Intervention(s): Monitored during session;Limited activity within patient's tolerance    Home Living Family/patient expects to be discharged to:: Skilled nursing facility Living Arrangements: Alone   Type of Home: Independent living facility         Home Equipment: Walker - 4 wheels      Prior Function Level of Independence: Independent with assistive device(s)         Comments: mobility has declined over last 3 weeks after pt had fall and resulting pain     Hand Dominance        Extremity/Trunk Assessment        Lower Extremity Assessment Lower Extremity Assessment: Generalized weakness       Communication   Communication: HOH  Cognition Arousal/Alertness: Awake/alert Behavior During Therapy: WFL for tasks assessed/performed Overall Cognitive Status: Within Functional Limits for tasks assessed                                        General Comments  Exercises     Assessment/Plan    PT Assessment Patient needs continued PT services  PT Problem List Decreased strength;Decreased mobility;Decreased balance;Decreased activity tolerance;Pain       PT Treatment Interventions DME instruction;Gait training;Balance training;Therapeutic exercise;Therapeutic activities;Patient/family education;Functional mobility training    PT Goals (Current goals can be found in the Care Plan section)  Acute Rehab PT Goals PT Goal Formulation: With patient Time For Goal Achievement: 07/24/20 Potential to Achieve Goals: Good    Frequency Min 2X/week   Barriers to discharge        Co-evaluation               AM-PAC PT "6 Clicks" Mobility  Outcome Measure Help needed turning from your back to your side while in a flat bed without using bedrails?: A Little Help  needed moving from lying on your back to sitting on the side of a flat bed without using bedrails?: A Little Help needed moving to and from a bed to a chair (including a wheelchair)?: A Little Help needed standing up from a chair using your arms (e.g., wheelchair or bedside chair)?: A Little Help needed to walk in hospital room?: A Lot Help needed climbing 3-5 steps with a railing? : A Lot 6 Click Score: 16    End of Session Equipment Utilized During Treatment: Gait belt Activity Tolerance: Patient limited by pain Patient left: in chair;with call bell/phone within reach;with chair alarm set Nurse Communication: Mobility status PT Visit Diagnosis: Difficulty in walking, not elsewhere classified (R26.2)    Time: 7902-4097 PT Time Calculation (min) (ACUTE ONLY): 13 min   Charges:   PT Evaluation $PT Eval Low Complexity: 1 Low        Kati PT, DPT Acute Rehabilitation Services Pager: [phone] Office: [phone]  Maida Sale E 07/10/2020, 12:44 PM

## 2020-07-10 NOTE — TOC Initial Note (Signed)
Transition of Care [facility]) - Initial/Assessment Note    Patient Details  Name: Sheila Mccoy MRN: [id number] Date of Birth: [birth year]  [facility] CM/SW Contact:    Lia Hopping, [place] Phone Number: 07/10/2020, 12:52 PM  Clinical Narrative:   Patient found to have UTI AND CT pelvis shows acute fracture involving the inferior aspect of the sacrum. PT recommendation for SNF.            CSW met with the patient at bedside to discuss SNF placement for short rehab. Patient report she lives in [place] independent living community. Patient is ambulatory with a rolling walker. Patient express difficulty with walking after she tripped over a Rolator and fell on June 13, 2020. Patient  Is agreeable to rehab at [facility]. CSW explain SNF process and will follow up with bed offer. Patient says she will call her daughter and provide an update.  FL2 completed.    Expected Discharge Plan: Skilled Nursing Facility Barriers to Discharge: Continued Medical Work up   Patient Goals and CMS Choice Patient states their goals for this hospitalization and ongoing recovery are:: go to snf for rehab CMS Medicare.gov Compare Post Acute Care list provided to:: Patient Choice offered to / list presented to : Patient  Expected Discharge Plan and Services Expected Discharge Plan: [place] In-house Referral: Clinical Social Work   Post Acute Care Choice: [place] Living arrangements for the past 2 months: [place]                 DME Arranged: N/A         HH Arranged: NA [place] Agency: NA        Prior Living Arrangements/Services Living arrangements for the past 2 months: [place] Lives with:: Self Patient language and need for interpreter reviewed:: No Do you feel safe going back to the place where you live?: Yes      Need for Family Participation in Patient Care: Yes (Comment) Care giver support system in  place?: Yes (comment) Current home services: DME Criminal Activity/Legal Involvement Pertinent to Current Situation/Hospitalization: No - Comment as needed  Activities of Daily Living Home Assistive Devices/Equipment: Dentures (specify type), Walker (specify type) Copy) ADL Screening (condition at time of admission) Patient's cognitive ability adequate to safely complete daily activities?: No Is the patient deaf or have difficulty hearing?: No Does the patient have difficulty seeing, even when wearing glasses/contacts?: No Does the patient have difficulty concentrating, remembering, or making decisions?: Yes Patient able to express need for assistance with ADLs?: Yes Does the patient have difficulty dressing or bathing?: Yes Independently performs ADLs?: Yes (appropriate for developmental age) Does the patient have difficulty walking or climbing stairs?: Yes Weakness of Legs: Both Weakness of Arms/Hands: None  Permission Sought/Granted Permission sought to share information with : Facility Art therapist granted to share information with : Yes, Verbal Permission Granted  Share Information with NAME: Otila Kluver  Permission granted to share info w AGENCY: [facility] in the area  Permission granted to share info w Relationship: Daughter  Permission granted to share info w Contact Information: [phone]  Emotional Assessment Appearance:: Appears stated age Attitude/Demeanor/Rapport: Engaged Affect (typically observed): Accepting Orientation: : Oriented to Self, Oriented to Place, Oriented to  Time, Oriented to Situation Alcohol / Substance Use: Not Applicable Psych Involvement: No (comment)  Admission diagnosis:  UTI (urinary tract infection) [N39.0] Acute cystitis without hematuria [N30.00] Closed fracture of sacrum, unspecified fracture  morphology, initial encounter [facility]) [S32.10XA] Patient Active Problem List   Diagnosis Date Noted   . UTI (urinary tract infection) 07/09/2020  . Acute metabolic encephalopathy 10/10/457  . Sacral fracture ([place]) 07/09/2020  . COPD (chronic obstructive pulmonary disease) ([place]) 07/09/2020  . Adnexal cyst 07/09/2020   PCP:  Buzzy Han, MD Pharmacy:   [facility] [address], [place] - [address] [place] [address] Phone: [phone] Fax: [phone]     Social Determinants of Health (SDOH) Interventions    Readmission Risk Interventions No flowsheet data found.

## 2020-07-10 NOTE — NC FL2 (Addendum)
[place] MEDICAID FL2 LEVEL OF CARE SCREENING TOOL     IDENTIFICATION  Patient Name: Sheila Mccoy Birthdate: Mar 31, 1931 Sex: female Admission Date (Current Location): 07/09/2020  [facility] and [place] Number:  Producer, television/film/video and Address:  [facility],  [address] [place], [place] [address]      Provider Number: [id number]  Attending Physician Name and Address:  Darlin Drop, DO  Relative Name and Phone Number:       Current Level of Care: Hospital Recommended Level of Care: Skilled Nursing Facility Prior Approval Number:    Date Approved/Denied:   PASRR Number:   [id number]  Discharge Plan: SNF    Current Diagnoses: Patient Active Problem List   Diagnosis Date Noted   UTI (urinary tract infection) 07/09/2020   Acute metabolic encephalopathy 07/09/2020   Sacral fracture ([facility]) 07/09/2020   COPD (chronic obstructive pulmonary disease) (HCC) 07/09/2020   Adnexal cyst 07/09/2020    Orientation RESPIRATION BLADDER Height & Weight     Self, Situation, Place  O2 Continent Weight: 140 lb (63.5 kg) Height:  5\' 3"  (160 cm)  BEHAVIORAL SYMPTOMS/MOOD NEUROLOGICAL BOWEL NUTRITION STATUS      Continent Diet  AMBULATORY STATUS COMMUNICATION OF NEEDS Skin   Extensive Assist Verbally Normal                       Personal Care Assistance Level of Assistance  Bathing, Feeding, Dressing Bathing Assistance: Maximum assistance Feeding assistance: Independent Dressing Assistance: Maximum assistance     Functional Limitations Info  Sight, Hearing, Speech Sight Info: Adequate Hearing Info: Adequate Speech Info: Adequate    SPECIAL CARE FACTORS FREQUENCY  PT (By licensed PT), OT (By licensed OT)     PT Frequency: 5X/WEEK OT Frequency: 5X/WEEK            Contractures Contractures Info: Not present    Additional Factors Info  Code Status, Allergies, Psychotropic Code Status Info: DNR Allergies Info: Allergies: Macrobid Nitrofurantoin            Current Medications (07/10/2020):  This is the current hospital active medication list Current Facility-Administered Medications  Medication Dose Route Frequency Provider Last Rate Last Admin   0.9 %  sodium chloride infusion   Intravenous Continuous Hall, Carole N, DO       albuterol (VENTOLIN HFA) 108 (90 Base) MCG/ACT inhaler 1-2 puff  1-2 puff Inhalation Q6H PRN 12-16-1991, MD   1 puff at 07/10/20 0229   cefTRIAXone (ROCEPHIN) 1 g in sodium chloride 0.9 % 100 mL IVPB  1 g Intravenous Q24H Doutova, Anastassia, MD       enoxaparin (LOVENOX) injection 40 mg  40 mg Subcutaneous Q24H Doutova, Anastassia, MD   40 mg at 07/09/20 2240   HYDROcodone-acetaminophen (NORCO/VICODIN) 5-325 MG per tablet 1-2 tablet  1-2 tablet Oral Q6H PRN 09/08/20, MD   2 tablet at 07/10/20 1013   levothyroxine (SYNTHROID) tablet 75 mcg  75 mcg Oral Q0600 09/09/20, MD   75 mcg at 07/10/20 0508   methocarbamol (ROBAXIN) tablet 500 mg  500 mg Oral Q6H PRN 09/09/20, MD   500 mg at 07/10/20 0228   Or   methocarbamol (ROBAXIN) 500 mg in dextrose 5 % 50 mL IVPB  500 mg Intravenous Q6H PRN Doutova, Anastassia, MD       morphine 2 MG/ML injection 1 mg  1 mg Intravenous Q2H PRN 0229, MD   1 mg at 07/10/20 [phone]  Discharge Medications: Please see discharge summary for a list of discharge medications.  Relevant Imaging Results:  Relevant Lab Results:   Additional Information SSN:[id number]  Clearance Coots, LCSW

## 2020-07-10 NOTE — Progress Notes (Signed)
Pt was in pain 7/10 received morphine 1 mg. Pain was worse and now c/o headache. She was put into chair by PT so we repositioned her to offload her hip and she received 2 Percocet 5-325. Later within the hour her BP was low 88/45 Yellow mews was implemented and her fluids were restarted. MD notified CN notified and pt stable. Still getting VS q 2 hours.

## 2020-07-11 ENCOUNTER — Inpatient Hospital Stay (HOSPITAL_COMMUNITY): Payer: Medicare HMO

## 2020-07-11 DIAGNOSIS — G9341 Metabolic encephalopathy: Secondary | ICD-10-CM | POA: Diagnosis not present

## 2020-07-11 LAB — COMPREHENSIVE METABOLIC PANEL
ALT: 15 U/L (ref 0–44)
AST: 19 U/L (ref 15–41)
Albumin: 3.2 g/dL — ABNORMAL LOW (ref 3.5–5.0)
Alkaline Phosphatase: 144 U/L — ABNORMAL HIGH (ref 38–126)
Anion gap: 9 (ref 5–15)
BUN: 14 mg/dL (ref 8–23)
CO2: 22 mmol/L (ref 22–32)
Calcium: 8.7 mg/dL — ABNORMAL LOW (ref 8.9–10.3)
Chloride: 105 mmol/L (ref 98–111)
Creatinine, Ser: 0.68 mg/dL (ref 0.44–1.00)
GFR calc non Af Amer: >60 mL/min (ref >60)
Glucose, Bld: 105 mg/dL — ABNORMAL HIGH (ref 70–99)
Potassium: 3.7 mmol/L (ref 3.5–5.1)
Sodium: 136 mmol/L (ref 135–145)
Total Bilirubin: 0.8 mg/dL (ref 0.3–1.2)
Total Protein: 6.4 g/dL — ABNORMAL LOW (ref 6.5–8.1)

## 2020-07-11 LAB — CBC WITH DIFFERENTIAL/PLATELET
Abs Immature Granulocytes: 0.02 10*3/uL (ref 0.00–0.07)
Basophils Absolute: 0 10*3/uL (ref 0.0–0.1)
Basophils Relative: 0 %
Eosinophils Absolute: 0.5 10*3/uL (ref 0.0–0.5)
Eosinophils Relative: 6 %
HCT: 42.2 % (ref 36.0–46.0)
Hemoglobin: 13.4 g/dL (ref 12.0–15.0)
Immature Granulocytes: 0 %
Lymphocytes Relative: 20 %
Lymphs Abs: 1.4 10*3/uL (ref 0.7–4.0)
MCH: 30.2 pg (ref 26.0–34.0)
MCHC: 31.8 g/dL (ref 30.0–36.0)
MCV: 95 fL (ref 80.0–100.0)
Monocytes Absolute: 0.9 10*3/uL (ref 0.1–1.0)
Monocytes Relative: 12 %
Neutro Abs: 4.3 10*3/uL (ref 1.7–7.7)
Neutrophils Relative %: 62 %
Platelets: 295 10*3/uL (ref 150–400)
RBC: 4.44 MIL/uL (ref 3.87–5.11)
RDW: 14.7 % (ref 11.5–15.5)
WBC: 7 10*3/uL (ref 4.0–10.5)
nRBC: 0 % (ref 0.0–0.2)

## 2020-07-11 LAB — AMMONIA: Ammonia: 32 umol/L (ref 9–35)

## 2020-07-11 LAB — BRAIN NATRIURETIC PEPTIDE: B Natriuretic Peptide: 173.6 pg/mL — ABNORMAL HIGH (ref 0.0–100.0)

## 2020-07-11 MED ORDER — ENSURE ENLIVE PO LIQD
237.0000 mL | Freq: Two times a day (BID) | ORAL | Status: DC
  Administered 2020-07-11 – 2020-07-13 (×5): 237 mL via ORAL

## 2020-07-11 MED ORDER — RISPERIDONE 0.25 MG PO TABS
0.5000 mg | ORAL_TABLET | Freq: Every day | ORAL | Status: DC
  Administered 2020-07-11 – 2020-07-12 (×2): 0.5 mg via ORAL
  Filled 2020-07-11 (×2): qty 2

## 2020-07-11 MED ORDER — BUPROPION HCL 75 MG PO TABS
75.0000 mg | ORAL_TABLET | Freq: Every day | ORAL | Status: DC
  Administered 2020-07-11 – 2020-07-13 (×3): 75 mg via ORAL
  Filled 2020-07-11 (×3): qty 1

## 2020-07-11 MED ORDER — BISACODYL 10 MG RE SUPP: 10.0000 mg | Freq: Every day | RECTAL | Status: DC | PRN

## 2020-07-11 MED ORDER — ACETAMINOPHEN 10 MG/ML IV SOLN
1000.0000 mg | Freq: Three times a day (TID) | INTRAVENOUS | Status: AC
  Administered 2020-07-11 – 2020-07-12 (×2): 1000 mg via INTRAVENOUS
  Filled 2020-07-11 (×3): qty 100

## 2020-07-11 MED ORDER — TRAMADOL HCL 50 MG PO TABS: 50.0000 mg | ORAL_TABLET | Freq: Two times a day (BID) | ORAL | Status: DC

## 2020-07-11 MED ORDER — GABAPENTIN 100 MG PO CAPS
100.0000 mg | ORAL_CAPSULE | Freq: Three times a day (TID) | ORAL | Status: DC
  Administered 2020-07-11 – 2020-07-13 (×5): 100 mg via ORAL
  Filled 2020-07-11 (×7): qty 1

## 2020-07-11 MED ORDER — MIRTAZAPINE 15 MG PO TABS
15.0000 mg | ORAL_TABLET | Freq: Every day | ORAL | Status: DC
  Administered 2020-07-11 – 2020-07-12 (×2): 15 mg via ORAL
  Filled 2020-07-11 (×2): qty 1

## 2020-07-11 MED ORDER — KETOROLAC TROMETHAMINE 15 MG/ML IJ SOLN
15.0000 mg | Freq: Three times a day (TID) | INTRAMUSCULAR | Status: AC
  Administered 2020-07-11 – 2020-07-13 (×5): 15 mg via INTRAVENOUS
  Filled 2020-07-11 (×5): qty 1

## 2020-07-11 MED ORDER — ADULT MULTIVITAMIN W/MINERALS CH
1.0000 | ORAL_TABLET | Freq: Every day | ORAL | Status: DC
  Administered 2020-07-12 – 2020-07-13 (×2): 1 via ORAL
  Filled 2020-07-11 (×2): qty 1

## 2020-07-11 MED ORDER — FUROSEMIDE 10 MG/ML IJ SOLN
20.0000 mg | Freq: Once | INTRAMUSCULAR | Status: AC
  Administered 2020-07-11: 20 mg via INTRAVENOUS
  Filled 2020-07-11: qty 2

## 2020-07-11 MED ORDER — PANTOPRAZOLE SODIUM 40 MG IV SOLR
40.0000 mg | Freq: Every day | INTRAVENOUS | Status: DC
  Administered 2020-07-12: 40 mg via INTRAVENOUS
  Filled 2020-07-11: qty 40

## 2020-07-11 MED ORDER — DEXTROSE-NACL 5-0.9 % IV SOLN: INTRAVENOUS | Status: DC

## 2020-07-11 MED ORDER — TRAMADOL HCL 50 MG PO TABS
50.0000 mg | ORAL_TABLET | Freq: Two times a day (BID) | ORAL | Status: DC
  Filled 2020-07-11: qty 1

## 2020-07-11 MED ORDER — KETOROLAC TROMETHAMINE 15 MG/ML IJ SOLN: 15.0000 mg | Freq: Four times a day (QID) | INTRAMUSCULAR | Status: DC

## 2020-07-11 NOTE — Progress Notes (Addendum)
PROGRESS NOTE  Sheila Mccoy WYO:[id number] DOB: [birth year] DOA: 07/09/2020 PCP: Margot Ables, MD  HPI/Recap of past 24 hours: Sheila Mccoy is a 86 y.o. female with medical history significant of anxiety, depression, thyroid disease, COPD,chronic DVT in right leg.  Presented with feeling weak, confusion, dyspnea, too fatigued to get up, pelvic pain after a fall 1 month ago.  Work-up revealed presumed UTI, hypovolemic hyponatremia, sacral fracture.  Due to dyspnea with chronic DVT, a CTA PE was done, it was negative for pulmonary embolism.  07/11/20: Seen and examined at her bedside this morning.  She says she can't breathe.  Confusion noted, not answering questions appropriately.  O2 saturation in the room 99%.  CXR done non acute.  Hx of anxiety, on home anti-anxiolytics.  Assessment/Plan: Active Problems:   UTI (urinary tract infection)   Acute metabolic encephalopathy   Sacral fracture (HCC)   COPD (chronic obstructive pulmonary disease) (HCC)   Adnexal cyst  Presumed UTI, POA Presented with increased frequency of urination, confusion, urine analysis positive for pyuria Started on Rocephin empirically on admisison, continue day # 2 U Cx suggestive of recollection, recollect and follow results  Acute metabolic encephalopathy likely multifactorial secondary to possible UTI, dehydration CT head non acute: 1. Generalized cerebral atrophy. 2. No acute intracranial abnormality. TSH normal Continue to treat underlying condition Rehydrate Reorient as needed Fall precautions Per her daughter no history of dementia, she had a bad reaction to Dilaudid previously, has given her significant confusion.  The writer discontinued all narcotics except for tramadol which she has tolerated at home.  Added tramadol, IV Toradol to alternate with IV Tylenol x2 days.  Also added IV Protonix and IV D5 normal saline, patient has had poor oral intake due to confusion.  Ordered scheduled medication  as the patient is confused and unable to request pain medication.  Sacral fracture post fall 1 month ago Continue supportive and pain management Fall precautions PT OT evaluation recommends SNF TOC consulted to assist with SNF placement. CT pelvis without contrast shows: 1. Acute fracture involving the inferior aspect of the sacrum, at the approximate level of S5. 2. Large right adnexal cyst, likely ovarian in origin. Further evaluation with pelvic ultrasound is recommended. 3. Colonic diverticulosis. Ortho consult  Chronic anxiety, not controlled with home regimen Psych consulted for recommendations Continue home regimen  Dyspnea/tachypnea chronic DVT/ non hypoxic CTA PE negative for PE. Incentive spirometer O2 sat 96-99% RA in the room Anxiety related? Control anxiety to see if tachypnea improves CXR done on 07/11/20 reviewed personally, no lobular infiltrates or evidence of pulmonary edema.  Large right adnexal cyst, likely ovarian in origin Pelvis ultrasound done on 07/09/2020 showed: Probable right ovarian cyst as described above. This has benign characteristics, but given its size, follow up by ultrasound is recommended in 6 months. This recommendation follows consensus guidelines: Simple Adnexal Cysts: SRU Consensus Conference Update on Follow-up and Reporting. Follow up with gyn outpatient  COPD Stable Resume home regimen  Hypothyroidism -TSH Resume home levothyroxine.  DVT prophylaxis:  Lovenox subcu daily      Code Status:   DNR/DNI  as per patient  And family     Family Communication:   Called to provide updates, 07/11/20   Disposition Plan:   likely will need placement for rehabilitation                           Consults called: Orthopedic surgery  Status is: Inpatient   Dispo:  Patient From: Assisted Living Facility  Planned Disposition: Skilled Nursing Facility  Expected discharge date: 07/13/20  Medically stable for discharge: No,  ongoing management of presumed UTI, uncontrolled anxiety, and sacral fracture.         Objective: Vitals:   07/10/20 2100 07/11/20 0850 07/11/20 1254 07/11/20 1336  BP: 123/63  (!) 158/96 (!) 149/89  Pulse: 84  86 85  Resp: 16  20 (!) 28  Temp: 98.4 F (36.9 C)     TempSrc: Oral     SpO2: 97% 93% 97% 99%  Weight:      Height:        Intake/Output Summary (Last 24 hours) at 07/11/2020 1419 Last data filed at 07/11/2020 [id number] Gross per 24 hour  Intake 2358.4 ml  Output 350 ml  Net 2008.4 ml   Filed Weights   07/09/20 1239  Weight: 63.5 kg    Exam:  . General: 86 y.o. year-old female well developed well nourished in no acute distress. Anxious. . Cardiovascular:Regular rate and rhythm, no rubs or gallops. Marland Kitchen. Respiratory: CTA no rubs or gallops . Abdomen: Soft NT NBS  . Musculoskeletal: No LE edema B/L . Psychiatry: Anxious  Data Reviewed: CBC: Recent Labs  Lab 07/09/20 1302 07/11/20 0722  WBC 9.9 7.0  NEUTROABS  --  4.3  HGB 13.9 13.4  HCT 42.0 42.2  MCV 90.3 95.0  PLT 392 295   Basic Metabolic Panel: Recent Labs  Lab 07/09/20 1302 07/11/20 0722  NA 134* 136  K 3.5 3.7  CL 99 105  CO2 24 22  GLUCOSE 109* 105*  BUN 20 14  CREATININE 0.90 0.68  CALCIUM 9.4 8.7*   GFR: Estimated Creatinine Clearance: 44.4 mL/min (by C-G formula based on SCr of 0.68 mg/dL). Liver Function Tests: Recent Labs  Lab 07/11/20 0722  AST 19  ALT 15  ALKPHOS 144*  BILITOT 0.8  PROT 6.4*  ALBUMIN 3.2*   No results for input(s): LIPASE, AMYLASE in the last 168 hours. Recent Labs  Lab 07/11/20 0722  AMMONIA 32   Coagulation Profile: No results for input(s): INR, PROTIME in the last 168 hours. Cardiac Enzymes: No results for input(s): CKTOTAL, CKMB, CKMBINDEX, TROPONINI in the last 168 hours. BNP (last 3 results) No results for input(s): PROBNP in the last 8760 hours. HbA1C: No results for input(s): HGBA1C in the last 72 hours. CBG: No results for input(s):  GLUCAP in the last 168 hours. Lipid Profile: No results for input(s): CHOL, HDL, LDLCALC, TRIG, CHOLHDL, LDLDIRECT in the last 72 hours. Thyroid Function Tests: Recent Labs    07/10/20 1427  TSH 2.676   Anemia Panel: No results for input(s): VITAMINB12, FOLATE, FERRITIN, TIBC, IRON, RETICCTPCT in the last 72 hours. Urine analysis:    Component Value Date/Time   COLORURINE STRAW (A) 07/09/2020 1455   APPEARANCEUR CLOUDY (A) 07/09/2020 1455   LABSPEC 1.010 07/09/2020 1455   PHURINE 6.0 07/09/2020 1455   GLUCOSEU NEGATIVE 07/09/2020 1455   HGBUR TRACE (A) 07/09/2020 1455   BILIRUBINUR NEGATIVE 07/09/2020 1455   KETONESUR NEGATIVE 07/09/2020 1455   PROTEINUR NEGATIVE 07/09/2020 1455   NITRITE NEGATIVE 07/09/2020 1455   LEUKOCYTESUR LARGE (A) 07/09/2020 1455   Sepsis Labs: @LABRCNTIP (procalcitonin:4,lacticidven:4)  ) Recent Results (from the past 240 hour(s))  Respiratory Panel by RT PCR (Flu A&B, Covid) - Nasopharyngeal Swab     Status: None   Collection Time: 07/09/20  2:37 PM   Specimen: Nasopharyngeal Swab  Result  Value Ref Range Status   SARS Coronavirus 2 by RT PCR NEGATIVE NEGATIVE Final    Comment: (NOTE) SARS-CoV-2 target nucleic acids are NOT DETECTED.  The SARS-CoV-2 RNA is generally detectable in upper respiratoy specimens during the acute phase of infection. The lowest concentration of SARS-CoV-2 viral copies this assay can detect is 131 copies/mL. A negative result does not preclude SARS-Cov-2 infection and should not be used as the sole basis for treatment or other patient management decisions. A negative result may occur with  improper specimen collection/handling, submission of specimen other than nasopharyngeal swab, presence of viral mutation(s) within the areas targeted by this assay, and inadequate number of viral copies (<131 copies/mL). A negative result must be combined with clinical observations, patient history, and epidemiological information.  The expected result is Negative.  Fact Sheet for Patients:  https://www.moore.com/  Fact Sheet for Healthcare Providers:  https://www.young.biz/  This test is no t yet approved or cleared by the [place] FDA and  has been authorized for detection and/or diagnosis of SARS-CoV-2 by FDA under an Emergency Use Authorization (EUA). This EUA will remain  in effect (meaning this test can be used) for the duration of the COVID-19 declaration under Section 564(b)(1) of the Act, 21 U.S.C. section 360bbb-3(b)(1), unless the authorization is terminated or revoked sooner.     Influenza A by PCR NEGATIVE NEGATIVE Final   Influenza B by PCR NEGATIVE NEGATIVE Final    Comment: (NOTE) The Xpert Xpress SARS-CoV-2/FLU/RSV assay is intended as an aid in  the diagnosis of influenza from Nasopharyngeal swab specimens and  should not be used as a sole basis for treatment. Nasal washings and  aspirates are unacceptable for Xpert Xpress SARS-CoV-2/FLU/RSV  testing.  Fact Sheet for Patients: https://www.moore.com/  Fact Sheet for Healthcare Providers: https://www.young.biz/  This test is not yet approved or cleared by the [place] FDA and  has been authorized for detection and/or diagnosis of SARS-CoV-2 by  FDA under an Emergency Use Authorization (EUA). This EUA will remain  in effect (meaning this test can be used) for the duration of the  Covid-19 declaration under Section 564(b)(1) of the Act, 21  U.S.C. section 360bbb-3(b)(1), unless the authorization is  terminated or revoked. Performed at [facility], [address] [place], [place] [address]   Urine culture     Status: Abnormal   Collection Time: 07/09/20  2:55 PM   Specimen: Urine, Random  Result Value Ref Range Status   Specimen Description   Final    URINE, RANDOM Performed at [facility], [address] [place],  [place] [address]    Special Requests   Final    NONE Performed at [facility], [address] [place], [place] [address]    Culture MULTIPLE SPECIES PRESENT, SUGGEST RECOLLECTION (A)  Final   Report Status 07/10/2020 FINAL  Final  SARS Coronavirus 2 by RT PCR (hospital order, performed in [facility] hospital lab) Nasopharyngeal Nasopharyngeal Swab     Status: None   Collection Time: 07/10/20  4:34 PM   Specimen: Nasopharyngeal Swab  Result Value Ref Range Status   SARS Coronavirus 2 NEGATIVE NEGATIVE Final    Comment: (NOTE) SARS-CoV-2 target nucleic acids are NOT DETECTED.  The SARS-CoV-2 RNA is generally detectable in upper and lower respiratory specimens during the acute phase of infection. The lowest concentration of SARS-CoV-2 viral copies this assay can detect is 250 copies / mL. A negative result does  not preclude SARS-CoV-2 infection and should not be used as the sole basis for treatment or other patient management decisions.  A negative result may occur with improper specimen collection / handling, submission of specimen other than nasopharyngeal swab, presence of viral mutation(s) within the areas targeted by this assay, and inadequate number of viral copies (<250 copies / mL). A negative result must be combined with clinical observations, patient history, and epidemiological information.  Fact Sheet for Patients:   BoilerBrush.com.cy  Fact Sheet for Healthcare Providers: https://pope.com/  This test is not yet approved or  cleared by the [place] FDA and has been authorized for detection and/or diagnosis of SARS-CoV-2 by FDA under an Emergency Use Authorization (EUA).  This EUA will remain in effect (meaning this test can be used) for the duration of the COVID-19 declaration under Section 564(b)(1) of the Act, 21 U.S.C. section 360bbb-3(b)(1), unless the authorization is terminated or revoked  sooner.  Performed at [facility], [address], [place], [place] [address]       Studies: DG CHEST PORT 1 VIEW  Result Date: 07/11/2020 CLINICAL DATA:  Shortness of breath EXAM: PORTABLE CHEST 1 VIEW COMPARISON:  July 09, 2020 chest radiograph and chest CT FINDINGS: There is atelectatic change in the left base. The lungs elsewhere are clear. Heart is upper normal in size with pulmonary vascularity normal. There is aortic atherosclerosis. Bones are osteoporotic. Old healed fracture of the proximal left humeral metaphysis noted. Extensive postoperative change right proximal humerus with advanced arthropathy in the right shoulder. IMPRESSION: Left base atelectasis. Lungs elsewhere clear. Heart size within normal limits. Aortic Atherosclerosis (ICD10-I70.0). Bones osteoporotic with posttraumatic and arthropathic changes in the shoulders. Electronically Signed   By: Bretta Bang III M.D.   On: 07/11/2020 10:01    Scheduled Meds: . buPROPion  75 mg Oral Daily  . dorzolamide-timolol  1 drop Left Eye BID  . enoxaparin (LOVENOX) injection  40 mg Subcutaneous Q24H  . feeding supplement (ENSURE ENLIVE)  237 mL Oral BID BM  . gabapentin  100 mg Oral TID  . latanoprost  1 drop Both Eyes QHS  . levothyroxine  75 mcg Oral Q0600  . mirtazapine  15 mg Oral QHS  . mometasone-formoterol  2 puff Inhalation BID  . multivitamin with minerals  1 tablet Oral Daily  . polyethylene glycol  17 g Oral Daily  . risperiDONE  0.5 mg Oral QHS  . senna-docusate  2 tablet Oral BID    Continuous Infusions: . cefTRIAXone (ROCEPHIN)  IV 1 g (07/10/20 1626)  . methocarbamol (ROBAXIN) IV       LOS: 1 day     Darlin Drop, MD [facility] Pager [phone]  If 7PM-7AM, please contact night-coverage [email or web address] Password [email or web address] 07/11/2020, 2:19 PM

## 2020-07-11 NOTE — TOC Progression Note (Addendum)
Transition of Care [facility]) - Progression Note    Patient Details  Name: Sheila Mccoy MRN: [id number] Date of Birth: [birth year]  Transition of [facility]) CM/SW Contact  Clearance Coots, LCSW Phone Number: 07/11/2020, 12:03 PM  Clinical Narrative:    Patient not medically stable for transfer.  Daughter chose [facility] for rehab. Insurance [employer] Approved 10/6-10/8. Talbot Grumbling [id number].  TOC staff will renew auth. If the patient has not discharged by 10/8.    Expected Discharge Plan: Skilled Nursing Facility Barriers to Discharge: Continued Medical Work up  Expected Discharge Plan and Services Expected Discharge Plan: Skilled Nursing Facility In-house Referral: Clinical Social Work   Post Acute Care Choice: Skilled Nursing Facility Living arrangements for the past 2 months: Independent Living Facility                 DME Arranged: N/A         HH Arranged: NA HH Agency: NA         Social Determinants of Health (SDOH) Interventions    Readmission Risk Interventions No flowsheet data found.

## 2020-07-11 NOTE — Progress Notes (Signed)
Family given update on patient. They are requesting pain medication for their mother that is stronger than tylenol but not morphine. MD paged. Awaiting new orders.

## 2020-07-11 NOTE — Progress Notes (Signed)
Daughter at bedside states patient having trouble breathing, o2 sats 98, resps 28 rescue inhaler given o2 applied. Sharrell Ku RN

## 2020-07-11 NOTE — Consult Note (Addendum)
ORTHOPAEDIC CONSULTATION  REQUESTING PHYSICIAN: Kayleen Memos, DO  Chief Complaint: pelvic pain  HPI: Sheila Mccoy is a 86 y.o. female with medical history significant of anxiety, depression, thyroid disease, COPD,chronic DVT in right leg. Patient presented to [facility] ED on 07/09/2020 with chief complaint of low back and pelvic pain x 1 month. Per chart review, She had a fall back in early September. She tripped on her rollator. She was worked up by her PCP and x-rays were negative for fracture. She had worsening pain and difficulty ambulating so her daughter to her to ED. She lives in a nursing facility. Findings in ED were significant for UTI and sacral fracture. Patient was admitted for treatment of UTI and pain management. Dr. Nevada Crane consulted orthopedics today for patient's sacral fracture.  Patient resting in hospital bed. Confused and mumbles responses. Per nursing staff, this is her baseline. She says she hurts all over.   Past Medical History:  Diagnosis Date  . Anxiety   . Depression   . Thyroid disease    Past Surgical History:  Procedure Laterality Date  . arm fracture surgery     Social History   Socioeconomic History  . Marital status: Divorced    Spouse name: Not on file  . Number of children: Not on file  . Years of education: Not on file  . Highest education level: Not on file  Occupational History  . Not on file  Tobacco Use  . Smoking status: Never Smoker  . Smokeless tobacco: Never Used  Vaping Use  . Vaping Use: Never used  Substance and Sexual Activity  . Alcohol use: Never  . Drug use: Never  . Sexual activity: Not on file  Other Topics Concern  . Not on file  Social History Narrative  . Not on file   Social Determinants of Health   Financial Resource Strain:   . Difficulty of Paying Living Expenses: Not on file  Food Insecurity:   . Worried About Charity fundraiser in the Last Year: Not on file  . Ran Out of Food in the  Last Year: Not on file  Transportation Needs:   . Lack of Transportation (Medical): Not on file  . Lack of Transportation (Non-Medical): Not on file  Physical Activity:   . Days of Exercise per Week: Not on file  . Minutes of Exercise per Session: Not on file  Stress:   . Feeling of Stress : Not on file  Social Connections:   . Frequency of Communication with Friends and Family: Not on file  . Frequency of Social Gatherings with Friends and Family: Not on file  . Attends Religious Services: Not on file  . Active Member of Clubs or Organizations: Not on file  . Attends Archivist Meetings: Not on file  . Marital Status: Not on file   Family History  Problem Relation Age of Onset  . Lung cancer Mother    Allergies  Allergen Reactions  . Macrobid [Nitrofurantoin] Other (See Comments)    tired   Prior to Admission medications   Medication Sig Start Date End Date Taking? Authorizing Provider  albuterol (VENTOLIN HFA) 108 (90 Base) MCG/ACT inhaler Inhale 1-2 puffs into the lungs every 6 (six) hours as needed for wheezing or shortness of breath. 06/08/20  Yes Bast, Traci A, NP  aspirin (ASPIRIN 81) 81 MG chewable tablet Chew 81 mg by mouth daily.    Yes [provider]  budesonide-formoterol (SYMBICORT) 80-4.5 MCG/ACT inhaler Inhale 1 puff into the lungs 2 (two) times daily.   Yes [provider]  buPROPion (WELLBUTRIN) 75 MG tablet Take 75 mg by mouth daily.  06/13/20  Yes [provider]  diclofenac Sodium (VOLTAREN) 1 % GEL Apply 2 g topically 3 (three) times daily as needed (pain).   Yes [provider]  dorzolamide-timolol (COSOPT) 22.3-6.8 MG/ML ophthalmic solution Place 1 drop into the left eye 2 (two) times daily.   Yes [provider]  furosemide (LASIX) 20 MG tablet Take 20 mg by mouth daily. 05/31/20  Yes [provider]  hydrOXYzine (VISTARIL) 25 MG capsule Take 25 mg by mouth 2 (two) times daily as needed (for  shortness of breath).   Yes [provider]  latanoprost (XALATAN) 0.005 % ophthalmic solution Place 1 drop into both eyes at bedtime.   Yes [provider]  levothyroxine (SYNTHROID) 75 MCG tablet Take by mouth. 06/13/20  Yes [provider]  mirtazapine (REMERON) 15 MG tablet Take 15 mg by mouth at bedtime.  06/13/20  Yes [provider]  risperiDONE (RISPERDAL) 0.5 MG tablet Take 0.5 mg by mouth at bedtime.  05/15/20  Yes [provider]  cyclobenzaprine (FLEXERIL) 5 MG tablet Take 5 mg by mouth 3 (three) times daily as needed for muscle spasms. Patient not taking: Reported on 07/10/2020    [provider]  gabapentin (NEURONTIN) 100 MG capsule Take 100 mg by mouth 3 (three) times daily. Patient not taking: Reported on 07/10/2020    [provider]   CT ANGIO CHEST PE W OR WO CONTRAST  Result Date: 07/09/2020 CLINICAL DATA:  Shortness of breath, PE suspected EXAM: CT ANGIOGRAPHY CHEST WITH CONTRAST TECHNIQUE: Multidetector CT imaging of the chest was performed using the standard protocol during bolus administration of intravenous contrast. Multiplanar CT image reconstructions and MIPs were obtained to evaluate the vascular anatomy. CONTRAST:  163m OMNIPAQUE IOHEXOL 350 MG/ML SOLN COMPARISON:  June 14, 2020 FINDINGS: Cardiovascular: There is a optimal opacification of the pulmonary arteries. There is no central,segmental, or subsegmental filling defects within the pulmonary arteries. There is mild cardiomegaly present. No pericardial effusion or thickening. No evidence right heart strain. There is normal three-vessel brachiocephalic anatomy without proximal stenosis. Scattered aortic atherosclerosis is noted. Coronary artery calcifications are seen. Mediastinum/Nodes: No hilar, mediastinal, or axillary adenopathy. Thyroid gland, trachea, and esophagus demonstrate no significant findings. Lungs/Pleura: Calcified granuloma seen at the right lung  base. There is a 7 mm ill-defined nodular opacity seen at the left lung apex which is stable from the prior exam, likely focal area of scarring. There is mild streaky airspace opacity seen at both lung bases. No large airspace consolidation or pleural effusion. There is also pleural calcifications seen at the the right lung apex. Upper Abdomen: No acute abnormalities present in the visualized portions of the upper abdomen. Musculoskeletal: No chest wall abnormality. No acute or significant osseous findings. There is a chronic endplate compression deformity of the L1 vertebral body with less than 25% loss in height. Review of the MIP images confirms the above findings. IMPRESSION: No central, segmental, or subsegmental pulmonary embolism. Mild bibasilar streaky airspace opacity, likely atelectasis or chronic lung changes. No other acute intrathoracic pathology to explain the patient's symptoms. Electronically Signed   By: BPrudencio PairM.D.   On: 07/09/2020 23:41   [place]PELVIS (TRANSABDOMINAL ONLY)  Result Date: 07/09/2020 CLINICAL DATA:  Adnexal cyst EXAM: TRANSABDOMINAL ULTRASOUND OF PELVIS TECHNIQUE: Transabdominal ultrasound  examination of the pelvis was performed including evaluation of the uterus, ovaries, adnexal regions, and pelvic cul-de-sac. COMPARISON:  CT same day FINDINGS: Uterus Measurements: 6.2 x 1.8 x 2.8 cm = volume: 16.7 mL. No fibroids or other mass visualized. Endometrium Thickness: 1.8 mm.  No focal abnormality visualized. Right ovary Measurements: 8.8 x 4.0 x 7.7 cm = volume: 142 mL. There is an anechoic cystic structures likely seen within the right ovary measuring 8.8 x 4.0 x 7.7 cm. No definite internal vascularity seen. Left ovary Measurements: Not visualized Other findings:  No abnormal free fluid. IMPRESSION: Probable right ovarian cyst as described above. This has benign characteristics, but given its size, follow up by ultrasound is recommended in 6 months. This recommendation follows  consensus guidelines: Simple Adnexal Cysts: SRU Consensus Conference Update on Follow-up and Reporting. Radiology 2019; [id number]. Electronically Signed   By: Prudencio Pair M.D.   On: 07/09/2020 22:46   DG CHEST PORT 1 VIEW  Result Date: 07/11/2020 CLINICAL DATA:  Shortness of breath EXAM: PORTABLE CHEST 1 VIEW COMPARISON:  July 09, 2020 chest radiograph and chest CT FINDINGS: There is atelectatic change in the left base. The lungs elsewhere are clear. Heart is upper normal in size with pulmonary vascularity normal. There is aortic atherosclerosis. Bones are osteoporotic. Old healed fracture of the proximal left humeral metaphysis noted. Extensive postoperative change right proximal humerus with advanced arthropathy in the right shoulder. IMPRESSION: Left base atelectasis. Lungs elsewhere clear. Heart size within normal limits. Aortic Atherosclerosis (ICD10-I70.0). Bones osteoporotic with posttraumatic and arthropathic changes in the shoulders. Electronically Signed   By: Lowella Grip III M.D.   On: 07/11/2020 10:01   Family History Reviewed and non-contributory, no pertinent history of problems with bleeding or anesthesia      Review of Systems Difficult to assess due to patient's mental status   OBJECTIVE  Vitals: Patient Vitals for the past 8 hrs:  BP Temp Temp src Pulse Resp SpO2  07/11/20 1538 (!) 146/89 98 F (36.7 C) Axillary 78 (!) 24 96 %  07/11/20 1336 (!) 149/89 -- -- 85 (!) 28 99 %  07/11/20 1254 (!) 158/96 -- -- 86 20 97 %   General: elderly female, resting comfortably in hospital bed, no acute distress Cardiovascular: Warm extremities noted Respiratory: No cyanosis, no use of accessory musculature GI: No organomegaly, abdomen is soft and non-tender Skin: No lesions in the area of chief complaint other than those listed below in MSK exam.  Neurologic: Sensation intact distally save for the below mentioned MSK exam Psychiatric: Patient is confused Lymphatic: No  swelling obvious and reported other than the area involved in the exam below  Extremities  RUE + LUE: Actively moves bilateral upper extremities without pain. Non-tender throughout. NVI.  RLE: Negative log roll. reports tenderness in her hip and knee. Tolerates ROM of right hip without pain. ROM right knee from 0-90 degrees. intact EHL/TA/GSC. Negative log roll. Endorses distal sensation. Warm well perfused foot LLE:  reports tenderness in her hip and knee. Tolerates ROM of leg hip without pain. ROM left knee from 0-90 degrees. intact EHL/TA/GSC. Endorses distal sensation. Warm well perfused foot   Test Results Imaging CT of lumbar spine showing chronic compression fracture of L1. Multilevel degenerative disc disease CT pelvis fracture of sacrum approximately at level of S5  Labs cbc Recent Labs    07/09/20 1302 07/11/20 0722  WBC 9.9 7.0  HGB 13.9 13.4  HCT 42.0 42.2  PLT 392 295  Labs inflam No results for input(s): CRP in the last 72 hours.  Invalid input(s): ESR  Labs coag No results for input(s): INR, PTT in the last 72 hours.  Invalid input(s): PT  Recent Labs    07/09/20 1302 07/11/20 0722  NA 134* 136  K 3.5 3.7  CL 99 105  CO2 24 22  GLUCOSE 109* 105*  BUN 20 14  CREATININE 0.90 0.68  CALCIUM 9.4 8.7*     ASSESSMENT AND PLAN: 86 y.o. female with the following: Sacral fracture - 1 month out from initial injury/fall  - Plan: Non-operative management - Recommendations: donut pillow for comfort, Ice, continue working with PT/OT - Weight Bearing Status/Activity: WBAT will need assistance due to patient's confusion and current status. Ambulated with rollator prior to injury. - VTE Prophylaxis: per medicine - Pain control: per medicine. Minimize narcotics as able due to patient's confusion - Follow-up plan: Patient can f/u in office with her PCP or Dr. Percell Miller if no improvements in her symptoms otherwise can follow-up as needed basis - Dispo: patient will  likely need SNF  Will obtain xrays of bilateral knees to r/o acute injury. Patient complaining of diffuse tenderness in bilateral knees and reported pain with ROM.   Update: bilateral knee xrays showing no acute bony abnormality. Tylenol 650 mg every 8 hours as needed for pain versus OTC Voltaren gel if continues to have pain in her knees. Continue plan as above.  Noemi Chapel, PA-C 07/11/2020

## 2020-07-11 NOTE — Progress Notes (Signed)
Initial Nutrition Assessment  INTERVENTION:   -Ensure Enlive po BID, each supplement provides 350 kcal and 20 grams of protein -Multivitamin with minerals daily  NUTRITION DIAGNOSIS:   Moderate Malnutrition related to acute illness, lethargy/confusion (sacral fracture, UTI) as evidenced by moderate fat depletion, moderate muscle depletion.  GOAL:   Patient will meet greater than or equal to 90% of their needs  MONITOR:   PO intake, Supplement acceptance, Labs, Weight trends, I & O's  REASON FOR ASSESSMENT:   Consult Assessment of nutrition requirement/status  ASSESSMENT:   86 y.o. 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  Presented with feeling weak, confusion, dyspnea, too fatigued to get up.  Work-up revealed presumed UTI and hypovolemic hyponatremia.  Patient reporting she feels a lot of pain. Alert/oriented x 1. Was unable to gather much information. Pt did deny any nausea. States she does not have an appetite d/t her pain levels.   Pt consumed 5-100% of meals yesterday. Suspect pain and mental status are main barriers to intakes. Will order Ensure supplements for additional kcals and protein.  Per weight records, pt weighed 145 lbs on 8/25. That is a 5 lb weight loss > 1 month which is insignificant.   Labs reviewed. Medications: Lasix, Remeron, Senokot  NUTRITION - FOCUSED PHYSICAL EXAM:    Most Recent Value  Orbital Region Moderate depletion  Upper Arm Region Moderate depletion  Thoracic and Lumbar Region Unable to assess  Buccal Region Moderate depletion  Temple Region Mild depletion  Clavicle Bone Region Mild depletion  Clavicle and Acromion Bone Region Mild depletion  Scapular Bone Region Unable to assess  Dorsal Hand Moderate depletion  Patellar Region No depletion  Anterior Thigh Region No depletion  Posterior Calf Region Mild depletion  Edema (RD Assessment) None       Diet  Order:   Diet Order            Diet Heart Room service appropriate? Yes; Fluid consistency: Thin  Diet effective now                 EDUCATION NEEDS:   Not appropriate for education at this time  Skin:  Skin Assessment: Reviewed RN Assessment  Last BM:  10/2  Height:   Ht Readings from Last 1 Encounters:  07/09/20 5\' 3"  (1.6 m)    Weight:   Wt Readings from Last 1 Encounters:  07/09/20 63.5 kg    BMI:  Body mass index is 24.8 kg/m.  Estimated Nutritional Needs:   Kcal:  1550-1750  Protein:  75-85g  Fluid:  1.7L/day   09/08/20, MS, RD, LDN Inpatient Clinical Dietitian Contact information available via Am[employer]

## 2020-07-12 ENCOUNTER — Encounter (HOSPITAL_COMMUNITY): Payer: Self-pay | Admitting: Internal Medicine

## 2020-07-12 DIAGNOSIS — F419 Anxiety disorder, unspecified: Secondary | ICD-10-CM

## 2020-07-12 DIAGNOSIS — N3000 Acute cystitis without hematuria: Secondary | ICD-10-CM | POA: Diagnosis not present

## 2020-07-12 DIAGNOSIS — G9341 Metabolic encephalopathy: Secondary | ICD-10-CM | POA: Diagnosis not present

## 2020-07-12 DIAGNOSIS — F32A Depression, unspecified: Secondary | ICD-10-CM

## 2020-07-12 DIAGNOSIS — N949 Unspecified condition associated with female genital organs and menstrual cycle: Secondary | ICD-10-CM | POA: Diagnosis not present

## 2020-07-12 LAB — COMPREHENSIVE METABOLIC PANEL
ALT: 14 U/L (ref 0–44)
AST: 16 U/L (ref 15–41)
Albumin: 3.2 g/dL — ABNORMAL LOW (ref 3.5–5.0)
Alkaline Phosphatase: 124 U/L (ref 38–126)
Anion gap: 11 (ref 5–15)
BUN: 17 mg/dL (ref 8–23)
CO2: 20 mmol/L — ABNORMAL LOW (ref 22–32)
Calcium: 8.9 mg/dL (ref 8.9–10.3)
Chloride: 103 mmol/L (ref 98–111)
Creatinine, Ser: 1 mg/dL (ref 0.44–1.00)
GFR calc non Af Amer: 51 mL/min — ABNORMAL LOW (ref >60)
Glucose, Bld: 105 mg/dL — ABNORMAL HIGH (ref 70–99)
Potassium: 3.2 mmol/L — ABNORMAL LOW (ref 3.5–5.1)
Sodium: 134 mmol/L — ABNORMAL LOW (ref 135–145)
Total Bilirubin: 1 mg/dL (ref 0.3–1.2)
Total Protein: 6.4 g/dL — ABNORMAL LOW (ref 6.5–8.1)

## 2020-07-12 LAB — CBC WITH DIFFERENTIAL/PLATELET
Abs Immature Granulocytes: 0.04 10*3/uL (ref 0.00–0.07)
Basophils Absolute: 0.1 10*3/uL (ref 0.0–0.1)
Basophils Relative: 1 %
Eosinophils Absolute: 0.4 10*3/uL (ref 0.0–0.5)
Eosinophils Relative: 5 %
HCT: 42.3 % (ref 36.0–46.0)
Hemoglobin: 13.5 g/dL (ref 12.0–15.0)
Immature Granulocytes: 0 %
Lymphocytes Relative: 18 %
Lymphs Abs: 1.7 10*3/uL (ref 0.7–4.0)
MCH: 29.6 pg (ref 26.0–34.0)
MCHC: 31.9 g/dL (ref 30.0–36.0)
MCV: 92.8 fL (ref 80.0–100.0)
Monocytes Absolute: 1.5 10*3/uL — ABNORMAL HIGH (ref 0.1–1.0)
Monocytes Relative: 16 %
Neutro Abs: 5.7 10*3/uL (ref 1.7–7.7)
Neutrophils Relative %: 60 %
Platelets: 365 10*3/uL (ref 150–400)
RBC: 4.56 MIL/uL (ref 3.87–5.11)
RDW: 15.1 % (ref 11.5–15.5)
WBC: 9.5 10*3/uL (ref 4.0–10.5)
nRBC: 0 % (ref 0.0–0.2)

## 2020-07-12 MED ORDER — BISACODYL 10 MG RE SUPP: 10.0000 mg | Freq: Every day | RECTAL | 0 refills | Status: DC | PRN

## 2020-07-12 MED ORDER — PANTOPRAZOLE SODIUM 40 MG PO TBEC: 40.0000 mg | DELAYED_RELEASE_TABLET | Freq: Every day | ORAL | 0 refills | Status: DC

## 2020-07-12 MED ORDER — TRAMADOL HCL 50 MG PO TABS
50.0000 mg | ORAL_TABLET | Freq: Two times a day (BID) | ORAL | 0 refills | Status: AC
Start: 2020-07-12 — End: 2020-07-17

## 2020-07-12 MED ORDER — POTASSIUM CHLORIDE CRYS ER 20 MEQ PO TBCR: 20.0000 meq | EXTENDED_RELEASE_TABLET | Freq: Every day | ORAL | 0 refills | Status: DC

## 2020-07-12 MED ORDER — POTASSIUM CHLORIDE CRYS ER 20 MEQ PO TBCR
40.0000 meq | EXTENDED_RELEASE_TABLET | Freq: Once | ORAL | Status: AC
  Administered 2020-07-12: 40 meq via ORAL
  Filled 2020-07-12: qty 2

## 2020-07-12 MED ORDER — POLYETHYLENE GLYCOL 3350 17 G PO PACK: 17.0000 g | PACK | Freq: Every day | ORAL | 0 refills | Status: DC | PRN

## 2020-07-12 NOTE — Progress Notes (Signed)
Physical Therapy Treatment Patient Details Name: Sheila Mccoy MRN: [id number] DOB: 21-Nov-1930 Today's Date: 07/12/2020    History of Present Illness 86 y.o. 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 Pt found to have UTI and CT pelvis shows acute fracture involving the inferior aspect of the sacrum    PT Comments    Pt pleasant and reports being tired today.  Pt able to follow commands.  Pt assisted OOB and then had incontinence episode with standing so assisted to recliner.  Pt denied pain multiple times throughout mobility.  Continue to recommend SNF upon d/c.   Follow Up Recommendations  SNF     Equipment Recommendations  None recommended by PT    Recommendations for Other Services       Precautions / Restrictions Precautions Precautions: Fall Restrictions Other Position/Activity Restrictions: WBAT per ortho note    Mobility  Bed Mobility Overal bed mobility: Needs Assistance Bed Mobility: Supine to Sit     Supine to sit: Min guard;HOB elevated        Transfers Overall transfer level: Needs assistance Equipment used: Rolling walker (2 wheeled) Transfers: Sit to/from [employer] Sit to Stand: Min assist Stand pivot transfers: Min assist       General transfer comment: verbal cues for safety technique, pt urinating upon standing, assisted to recliner and then assisted with hygiene; pt fatigued thereafter; pt on 3L O2 [place] throughout mobility, SPO2 95% after tranfser  Ambulation/Gait                 Stairs             Wheelchair Mobility    Modified Rankin (Stroke Patients Only)       Balance                                            Cognition Arousal/Alertness: Awake/alert Behavior During Therapy: WFL for tasks assessed/performed Overall Cognitive Status: Within Functional Limits for tasks assessed                                         Exercises      General Comments        Pertinent Vitals/Pain Pain Assessment: No/denies pain Pain Intervention(s): Monitored during session;Repositioned    Home Living                      Prior Function            PT Goals (current goals can now be found in the care plan section) Progress towards PT goals: Progressing toward goals    Frequency    Min 2X/week      PT Plan Current plan remains appropriate    Co-evaluation              AM-PAC PT "6 Clicks" Mobility   Outcome Measure  Help needed turning from your back to your side while in a flat bed without using bedrails?: A Little Help needed moving from lying on your back to sitting on the side of a flat bed without using bedrails?: A Little Help needed moving to and from a bed to a chair (including a wheelchair)?: A Little Help needed standing up from  a chair using your arms (e.g., wheelchair or bedside chair)?: A Little Help needed to walk in hospital room?: A Little Help needed climbing 3-5 steps with a railing? : A Lot 6 Click Score: 17    End of Session Equipment Utilized During Treatment: Gait belt Activity Tolerance: Patient tolerated treatment well Patient left: in chair;with call bell/phone within reach;with chair alarm set   PT Visit Diagnosis: Difficulty in walking, not elsewhere classified (R26.2)     Time: 9449-6759 PT Time Calculation (min) (ACUTE ONLY): 16 min  Charges:  $Therapeutic Activity: 8-22 mins                     Thomasene Mohair PT, DPT Acute Rehabilitation Services Pager: [phone] Office: [phone]   Maida Sale E 07/12/2020, 12:39 PM

## 2020-07-12 NOTE — Consult Note (Signed)
[facility] Face-to-Face Psychiatry Consult   Reason for Consult:  Anxiety Referring Physician:  Darlin Drop, DO Patient Identification: Sheila Mccoy MRN:  [id number] Principal Diagnosis: Anxiety Diagnosis:  Principal Problem:   Anxiety Active Problems:   UTI (urinary tract infection)   Acute metabolic encephalopathy   Sacral fracture (HCC)   COPD (chronic obstructive pulmonary disease) (HCC)   Adnexal cyst   Total Time spent with patient: 30 minutes  Subjective:   Sheila Mccoy is a 86 y.o. 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  Patient  admitted medically to [facility] after presenting with complaints of feeling weak, confusion, dyspnea, too fatigued to get up, pelvic pain after a fall 1 month ago.   HPI:  Sheila Mccoy, 86 y.o., female patient seen for psychiatric consult was seen face to face by this provider, consulted with Dr. Lucianne Muss; and chart reviewed on 07/12/20.  On evaluation Sheila Mccoy patient is elevated up in bed resting comfortably and very pleasant.  Patient states that she is unsure why she was admitted to the hospital.  States that she is not anxious and denies any depression.  When asked if she has any psychiatric history patient states "No, I've only had one breakdown and that was after the delivery of a baby." and required psychiatric hospitalization at that time.  Patient states that she lives alone but has a daughter that she talks to daily.  Patient denies suicidal/self-harm/homicidal ideation, psychosis, and paranoia.  Patient does admit to have difficulty with her memory at times.  Patient was able to give the correct information for her age, date of birth, current location, city, state, country, month, and year.  She was only able to give the name of the last 2 presidents; giving the name of the current and the one prior.  When asked the name of president prior to Trump; patient thought for a moment and then stated  she could not remember.    During evaluation Sheila Mccoy is alert/oriented x 4; calm/cooperative;  Her mood is pleasant, friendly and mood is congruent with affect.  She does not appear to be responding to internal/external stimuli or delusional thoughts and denies suicidal/self-harm/homicidal ideation, psychosis, and paranoia.  Patient doesn't appear to be anxious at any point during assessment.  Patient reporting that her medications are prescribed by her PCP.     After reviewing patient chart only noted chronic history of depression and anxiety and charts up to 2016 patient has been taking same psychotropic medication Remeron, Risperdal and Wellbutrin.   Past Psychiatric History: Depression and anxiety  Risk to Self:  No Risk to Others:  No Prior Inpatient Therapy:  Yes Prior Outpatient Therapy:  Yes  Past Medical History:  Past Medical History:  Diagnosis Date  . Anxiety   . Depression   . Thyroid disease     Past Surgical History:  Procedure Laterality Date  . arm fracture surgery     Family History:  Family History  Problem Relation Age of Onset  . Lung cancer Mother    Family Psychiatric  History: Unaware Social History:  Social History   Substance and Sexual Activity  Alcohol Use Never     Social History   Substance and Sexual Activity  Drug Use Never    Social History   Socioeconomic History  . Marital status: Divorced    Spouse name: Not on file  . Number of children: Not on file  . Years of education:  Not on file  . Highest education level: Not on file  Occupational History  . Not on file  Tobacco Use  . Smoking status: Never Smoker  . Smokeless tobacco: Never Used  Vaping Use  . Vaping Use: Never used  Substance and Sexual Activity  . Alcohol use: Never  . Drug use: Never  . Sexual activity: Not on file  Other Topics Concern  . Not on file  Social History Narrative  . Not on file   Social Determinants of Health   Financial Resource  Strain:   . Difficulty of Paying Living Expenses: Not on file  Food Insecurity:   . Worried About Programme researcher, broadcasting/film/videounning Out of Food in the Last Year: Not on file  . Ran Out of Food in the Last Year: Not on file  Transportation Needs:   . Lack of Transportation (Medical): Not on file  . Lack of Transportation (Non-Medical): Not on file  Physical Activity:   . Days of Exercise per Week: Not on file  . Minutes of Exercise per Session: Not on file  Stress:   . Feeling of Stress : Not on file  Social Connections:   . Frequency of Communication with Friends and Family: Not on file  . Frequency of Social Gatherings with Friends and Family: Not on file  . Attends Religious Services: Not on file  . Active Member of Clubs or Organizations: Not on file  . Attends BankerClub or Organization Meetings: Not on file  . Marital Status: Not on file   Additional Social History:    Allergies:   Allergies  Allergen Reactions  . Macrobid [Nitrofurantoin] Other (See Comments)    tired    Labs:  Results for orders placed or performed during the hospital encounter of 07/09/20 (from the past 48 hour(s))  TSH     Status: None   Collection Time: 07/10/20  2:27 PM  Result Value Ref Range   TSH 2.676 0.350 - 4.500 uIU/mL    Comment: Performed by a 3rd Generation assay with a functional sensitivity of <=0.01 uIU/mL. Performed at [facility], [address] [place], [place] [address]   SARS Coronavirus 2 by RT PCR (hospital order, performed in [facility] hospital lab) Nasopharyngeal Nasopharyngeal Swab     Status: None   Collection Time: 07/10/20  4:34 PM   Specimen: Nasopharyngeal Swab  Result Value Ref Range   SARS Coronavirus 2 NEGATIVE NEGATIVE    Comment: (NOTE) SARS-CoV-2 target nucleic acids are NOT DETECTED.  The SARS-CoV-2 RNA is generally detectable in upper and lower respiratory specimens during the acute phase of infection. The lowest concentration of SARS-CoV-2 viral copies this  assay can detect is 250 copies / mL. A negative result does not preclude SARS-CoV-2 infection and should not be used as the sole basis for treatment or other patient management decisions.  A negative result may occur with improper specimen collection / handling, submission of specimen other than nasopharyngeal swab, presence of viral mutation(s) within the areas targeted by this assay, and inadequate number of viral copies (<250 copies / mL). A negative result must be combined with clinical observations, patient history, and epidemiological information.  Fact Sheet for Patients:   BoilerBrush.com.cyhttps://www.fda.gov/media/136312/download  Fact Sheet for Healthcare Providers: https://pope.com/https://www.fda.gov/media/136313/download  This test is not yet approved or  cleared by the [place] FDA and has been authorized for detection and/or diagnosis of SARS-CoV-2 by FDA under an Emergency Use Authorization (EUA).  This EUA will remain in effect (meaning  this test can be used) for the duration of the COVID-19 declaration under Section 564(b)(1) of the Act, 21 U.S.C. section 360bbb-3(b)(1), unless the authorization is terminated or revoked sooner.  Performed at [facility], [address] [place], [place] [address]   CBC with Differential/Platelet     Status: None   Collection Time: 07/11/20  7:22 AM  Result Value Ref Range   WBC 7.0 4.0 - 10.5 K/uL   RBC 4.44 3.87 - 5.11 MIL/uL   Hemoglobin 13.4 12.0 - 15.0 g/dL   HCT 81.1 36 - 46 %   MCV 95.0 80.0 - 100.0 fL   MCH 30.2 26.0 - 34.0 pg   MCHC 31.8 30.0 - 36.0 g/dL   RDW 91.4 78.2 - 95.6 %   Platelets 295 150 - 400 K/uL   nRBC 0.0 0.0 - 0.2 %   Neutrophils Relative % 62 %   Neutro Abs 4.3 1.7 - 7.7 K/uL   Lymphocytes Relative 20 %   Lymphs Abs 1.4 0.7 - 4.0 K/uL   Monocytes Relative 12 %   Monocytes Absolute 0.9 0 - 1 K/uL   Eosinophils Relative 6 %   Eosinophils Absolute 0.5 0 - 0 K/uL   Basophils Relative 0 %   Basophils  Absolute 0.0 0 - 0 K/uL   Immature Granulocytes 0 %   Abs Immature Granulocytes 0.02 0.00 - 0.07 K/uL    Comment: Performed at [facility], [address], [place], [place] [address]  Comprehensive metabolic panel     Status: Abnormal   Collection Time: 07/11/20  7:22 AM  Result Value Ref Range   Sodium 136 135 - 145 mmol/L   Potassium 3.7 3.5 - 5.1 mmol/L   Chloride 105 98 - 111 mmol/L   CO2 22 22 - 32 mmol/L   Glucose, Bld 105 (H) 70 - 99 mg/dL    Comment: Glucose reference range applies only to samples taken after fasting for at least 8 hours.   BUN 14 8 - 23 mg/dL   Creatinine, Ser 6.57 0.44 - 1.00 mg/dL   Calcium 8.7 (L) 8.9 - 10.3 mg/dL   Total Protein 6.4 (L) 6.5 - 8.1 g/dL   Albumin 3.2 (L) 3.5 - 5.0 g/dL   AST 19 15 - 41 U/L   ALT 15 0 - 44 U/L   Alkaline Phosphatase 144 (H) 38 - 126 U/L   Total Bilirubin 0.8 0.3 - 1.2 mg/dL   GFR calc non Af Amer >60 >60 mL/min   Anion gap 9 5 - 15    Comment: Performed at [facility], [address], [place], [place] [address]  Ammonia     Status: None   Collection Time: 07/11/20  7:22 AM  Result Value Ref Range   Ammonia 32 9 - 35 umol/L    Comment: Performed at [facility], [address] [place], [place] [address]  Brain natriuretic peptide     Status: Abnormal   Collection Time: 07/11/20  7:22 AM  Result Value Ref Range   B Natriuretic Peptide 173.6 (H) 0.0 - 100.0 pg/mL    Comment: Performed at [facility], [address], [place], [place] [address]  CBC with Differential/Platelet     Status: Abnormal   Collection Time: 07/12/20  3:32 AM  Result Value Ref Range   WBC 9.5 4.0 - 10.5 K/uL   RBC 4.56 3.87 - 5.11 MIL/uL   Hemoglobin 13.5 12.0 - 15.0  g/dL   HCT 14.7 36 - 46 %   MCV 92.8 80.0 - 100.0 fL   MCH 29.6 26.0 - 34.0 pg   MCHC 31.9 30.0 - 36.0 g/dL   RDW 82.9 56.2 - 13.0 %   Platelets 365 150 - 400 K/uL   nRBC 0.0 0.0 - 0.2 %    Neutrophils Relative % 60 %   Neutro Abs 5.7 1.7 - 7.7 K/uL   Lymphocytes Relative 18 %   Lymphs Abs 1.7 0.7 - 4.0 K/uL   Monocytes Relative 16 %   Monocytes Absolute 1.5 (H) 0 - 1 K/uL   Eosinophils Relative 5 %   Eosinophils Absolute 0.4 0 - 0 K/uL   Basophils Relative 1 %   Basophils Absolute 0.1 0 - 0 K/uL   Immature Granulocytes 0 %   Abs Immature Granulocytes 0.04 0.00 - 0.07 K/uL    Comment: Performed at [facility], [address], [place], [place] [address]  Comprehensive metabolic panel     Status: Abnormal   Collection Time: 07/12/20  3:32 AM  Result Value Ref Range   Sodium 134 (L) 135 - 145 mmol/L   Potassium 3.2 (L) 3.5 - 5.1 mmol/L   Chloride 103 98 - 111 mmol/L   CO2 20 (L) 22 - 32 mmol/L   Glucose, Bld 105 (H) 70 - 99 mg/dL    Comment: Glucose reference range applies only to samples taken after fasting for at least 8 hours.   BUN 17 8 - 23 mg/dL   Creatinine, Ser 4.69 0.44 - 1.00 mg/dL   Calcium 8.9 8.9 - 62.9 mg/dL   Total Protein 6.4 (L) 6.5 - 8.1 g/dL   Albumin 3.2 (L) 3.5 - 5.0 g/dL   AST 16 15 - 41 U/L   ALT 14 0 - 44 U/L   Alkaline Phosphatase 124 38 - 126 U/L   Total Bilirubin 1.0 0.3 - 1.2 mg/dL   GFR calc non Af Amer 51 (L) >60 mL/min   Anion gap 11 5 - 15    Comment: Performed at [facility], [address] [place], [place] [address]    Current Facility-Administered Medications  Medication Dose Route Frequency Provider Last Rate Last Admin  . albuterol (VENTOLIN HFA) 108 (90 Base) MCG/ACT inhaler 1-2 puff  1-2 puff Inhalation Q6H PRN Therisa Doyne, MD   2 puff at 07/11/20 2125  . bisacodyl (DULCOLAX) suppository 10 mg  10 mg Rectal Daily PRN Dow Adolph N, DO      . buPROPion [facility]) tablet 75 mg  75 mg Oral Daily Dow Adolph N, DO   75 mg at 07/12/20 3244  . cefTRIAXone (ROCEPHIN) 1 g in sodium chloride 0.9 % 100 mL IVPB  1 g Intravenous Q24H Therisa Doyne, MD   Stopped at 07/12/20 0920   . dextrose 5 %-0.9 % sodium chloride infusion   Intravenous Continuous Hall, Carole N, DO      . dorzolamide-timolol (COSOPT) 22.3-6.8 MG/ML ophthalmic solution 1 drop  1 drop Left Eye BID Dow Adolph N, DO   1 drop at 07/12/20 0920  . enoxaparin (LOVENOX) injection 40 mg  40 mg Subcutaneous Q24H Doutova, Anastassia, MD   40 mg at 07/11/20 2121  . feeding supplement (ENSURE ENLIVE) (ENSURE ENLIVE) liquid 237 mL  237 mL Oral BID BM Hall, Carole N, DO   237 mL at 07/12/20 1049  . gabapentin (NEURONTIN) capsule 100 mg  100 mg Oral TID Darlin Drop, DO  100 mg at 07/11/20 2113  . hydrOXYzine (ATARAX/VISTARIL) tablet 25 mg  25 mg Oral BID PRN Darlin Drop, DO   25 mg at 07/10/20 2319  . ketorolac (TORADOL) 15 MG/ML injection 15 mg  15 mg Intravenous Q8H Hall, Carole N, DO   15 mg at 07/12/20 0452  . latanoprost (XALATAN) 0.005 % ophthalmic solution 1 drop  1 drop Both Eyes QHS Hall, Carole N, DO   1 drop at 07/11/20 2120  . levothyroxine (SYNTHROID) tablet 75 mcg  75 mcg Oral Q0600 Therisa Doyne, MD   75 mcg at 07/12/20 0452  . mirtazapine (REMERON) tablet 15 mg  15 mg Oral QHS Dow Adolph N, DO   15 mg at 07/11/20 2112  . mometasone-formoterol (DULERA) 100-5 MCG/ACT inhaler 2 puff  2 puff Inhalation BID Dow Adolph N, DO   2 puff at 07/12/20 0757  . multivitamin with minerals tablet 1 tablet  1 tablet Oral Daily Darlin Drop, DO   1 tablet at 07/12/20 0906  . pantoprazole (PROTONIX) injection 40 mg  40 mg Intravenous Daily Dow Adolph N, DO   40 mg at 07/12/20 9767  . polyethylene glycol (MIRALAX / GLYCOLAX) packet 17 g  17 g Oral Daily Dow Adolph N, DO   17 g at 07/12/20 0910  . risperiDONE (RISPERDAL) tablet 0.5 mg  0.5 mg Oral QHS Dow Adolph N, DO   0.5 mg at 07/11/20 2112  . senna-docusate (Senokot-S) tablet 2 tablet  2 tablet Oral BID Darlin Drop, DO   2 tablet at 07/12/20 3419  . traMADol (ULTRAM) tablet 50 mg  50 mg Oral Q12H Marikay Alar, FNP         Musculoskeletal: Strength & Muscle Tone: Moving upper limbs with out difficulty Gait & Station: Did not see patient ambulate Patient leans: Backward  Psychiatric Specialty Exam: Physical Exam Constitutional:      Appearance: Normal appearance.     Comments: Elderly female elevated up in bed, resting comfortably.  No complaints to this provider.    Pulmonary:     Effort: Pulmonary effort is normal.  Skin:    General: Skin is warm and dry.     Comments: No apparent lesions on visible skin (arms bilateral)   Neurological:     Mental Status: She is alert and oriented to person, place, and time.  Psychiatric:        Attention and Perception: Attention and perception normal. She does not perceive auditory or visual hallucinations.        Mood and Affect: Mood and affect normal.        Speech: Speech normal.        Behavior: Behavior normal. Behavior is cooperative.        Thought Content: Thought content is not paranoid. Thought content does not include homicidal or suicidal ideation.        Cognition and Memory: Cognition normal.        Judgment: Judgment normal.     Review of Systems  Constitutional: Negative for chills and diaphoresis.       Reporting that she is feeling "okay" Denies any complaints at this time  Psychiatric/Behavioral: Negative for agitation and behavioral problems. Hallucinations: Denies. Self-injury: Denies. Sleep disturbance: denies. Suicidal ideas: Denies. Nervous/anxious: Denies.        Patient stating that she is not anxious; also denies depression.  Patient resting peacefully and comfortably in hospital bed.  Pleasant affect.  Patient states that she has had only  one "breakdown and that was after the delivery of a baby."   Patient does admit to have some difficulty with her memory at times     Blood pressure 101/68, pulse 64, temperature 98.3 F (36.8 C), resp. rate 18, height 5\' 3"  (1.6 m), weight 63.5 kg, SpO2 98 %.Body mass index is 24.8 kg/m.   General Appearance: Casual; hospital gown  Eye Contact:  Good  Speech:  Clear and Coherent and Normal Rate  Volume:  Normal  Mood:  "Okay" pleasant  Affect:  Appropriate and Congruent  Thought Process:  Coherent, Goal Directed and Descriptions of Associations: Intact  Orientation:  Full (Time, Place, and Person)  Thought Content:  WDL  Suicidal Thoughts:  No  Homicidal Thoughts:  No  Memory:  Immediate;   Fair Recent;   Fair  Judgement:  Fair  Insight:  Present  Psychomotor Activity:  Normal  Concentration:  Concentration: Fair and Attention Span: Fair  Recall:  of Knowledge:  Good  Language:  Good  Akathisia:  No  Handed:  Right  AIMS (if indicated):     Assets:  Communication Skills Desire for Improvement Housing Social Support  ADL's:  Intact prior to hospitalization  Cognition:  Impaired,  Mild  Sleep:       Treatment Plan Summary: Plan Psychiatrically clear  Disposition:  Psychiatrically cleared.   No evidence of imminent risk to self or others at present.   Patient does not meet criteria for psychiatric inpatient admission. Supportive therapy provided about ongoing stressors. Discussed crisis plan, support from social network, calling 911, coming to the Emergency Department, and calling Suicide Hotline.   Secure message sent to Dr. [employer] informing:  Patient psychiatrically cleared.  Patient unsure of what happened to get her admitted to the hospital.  Patient has a chronic history of anxiety and depression which has been managed by the same medications since 2016.  History of dementia and other chronic medical co morbidities which could attribute to worsening of anxiety or depression at time.  No medication changes recommended.  Sheila Belongia, NP 07/12/2020 2:26 PM

## 2020-07-12 NOTE — Progress Notes (Addendum)
PROGRESS NOTE  Sheila Mccoy WUJ:[id number] DOB: 08/22/1931 DOA: 07/09/2020 PCP: Margot Ableskwubunka-Anyim, [place]un[place], MD   LOS: 2 days   Brief narrative: As per HPI,  Sheila Mccoy is a 86 y.o. 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  Presented to the hospital with feeling weak, confusion, dyspnea, too fatigued to get up, pelvic pain after a fall 1 month ago.  Work-up revealed presumed UTI, hypovolemic hyponatremia, sacral fracture.  Due to dyspnea, with chronic DVT, a CTA PE study was done, it was negative for pulmonary embolism.  Patient persisted to have severe pain requiring IV narcotics and Toradol plus encephalopathy requiring further evaluation in the hospital.  Assessment/Plan:  Active Problems:   UTI (urinary tract infection)   Acute metabolic encephalopathy   Sacral fracture (HCC)   COPD (chronic obstructive pulmonary disease) (HCC)   Adnexal cyst  Presumed UTI, present on admission. Patient did have urinary symptoms including frequency of urination and had confusion and urinalysis was positive for white cells.  Empirically on IV Rocephin.  Day 3 today.  Will discontinue after today.  Urine culture showed multiple species.  Acute metabolic encephalopathy likely multifactorial from a UTI, volume depletion.   CT head scan showed generalized cerebral atrophy.  TSH was normal.  Patient received IV fluid hydration.  Patient required narcotics for severe pain with significant confusion which has been on hold.  Toradol IV has been added to control her severe pain.     Sacral fracture with intractable pain.  Post fall 1 month ago.  Has had intractable pain requiring IV narcotic x1 followed by confusion.  Currently on IV Toradol for adequate pain relief.  Continue gabapentin 100 mg 3 times a day.  Patient is on tramadol as well due to severe pain.  Patient has been seen by physical therapy who has recommended skilled nursing  facility placement at this time.  CT scan of the pelvis showed no acute fracture involving the inferior aspect of the sacrum.   Chronic anxiety, not controlled with home regimen Psychiatry has seen the patient at this time.  No specific or further recommendation on medications given at this time.  Continue home medications.   Dyspnea/tachypnea with history of chronic DVT/ non hypoxic CT angiogram of the chest this time was negative for PE.  Could be exacerbated by anxiety.  Large right adnexal cyst, likely ovarian in origin Pelvis ultrasound done on 07/09/2020 showed: Probable right ovarian cyst.  Follow-up in 6 months was recommended.   COPD On 2 L of nasal cannula oxygen.  Continue to wean oxygen as able.  Continue bronchodilators.  Hypothyroidism Continue levothyroxine.  Moderate malnutrition present on admission.  Continue to nutritional supplement.  Dietitian on board.   DVT prophylaxis:  Lovenox subcu daily      Code Status:   DNR/DNI       Family Communication: None  Status is: Inpatient  Remains inpatient appropriate because: IV treatments appropriate due to intensity of illness or inability to take PO and Inpatient level of care appropriate due to severity of illness.    Dispo:  Patient From: Assisted Living Facility  Planned Disposition: Skilled Nursing Facility  Barriers to discharge.  Intractable pain requiring IV medications including oral narcotics, ambulatory dysfunction, metabolic encephalopathy, debility deconditioning  Expected discharge date: 07/13/20  Medically stable for discharge: No  Consultants: Orthopedic surgery  Procedures: None  Antibiotics:  Rocephin IV  Anti-infectives (From admission, onward)    Start  Dose/Rate Route Frequency Ordered Stop   07/10/20 1700  cefTRIAXone (ROCEPHIN) 1 g in sodium chloride 0.9 % 100 mL IVPB        1 g 200 mL/hr over 30 Minutes Intravenous Every 24 hours 07/09/20 2202     07/09/20 1630  cefTRIAXone  (ROCEPHIN) 1 g in sodium chloride 0.9 % 100 mL IVPB        1 g 200 mL/hr over 30 Minutes Intravenous  Once 07/09/20 1622 07/09/20 1841      Subjective: Today, patient was seen and examined at bedside.  Complains of severe pain in the sacral area.  No nausea, vomiting, fever or chills.  Objective: Vitals:   07/12/20 0543 07/12/20 0757  BP: 138/88   Pulse: 66   Resp: 16   Temp: 98.3 F (36.8 C)   SpO2: (!) 84% 99%    Intake/Output Summary (Last 24 hours) at 07/12/2020 0845 Last data filed at 07/12/2020 0543 Gross per 24 hour  Intake 200 ml  Output 600 ml  Net -400 ml   Filed Weights   07/09/20 1239  Weight: 63.5 kg   Body mass index is 24.8 kg/m.   Physical Exam: GENERAL: Patient is alert awake and communicative..  In mild distress due to pain.,  Anxious HENT: No scleral pallor or icterus. Pupils equally reactive to light. Oral mucosa is moist NECK: is supple, no gross swelling noted. CHEST: Clear to auscultation. No crackles or wheezes.  Diminished breath sounds bilaterally. CVS: S1 and S2 heard, no murmur. Regular rate and rhythm.  ABDOMEN: Soft, non-tender, bowel sounds are present.  Sacral area tenderness. EXTREMITIES: No edema. CNS: Cranial nerves are intact. No focal motor deficits. SKIN: warm and dry without rashes.  Data Review: I have personally reviewed the following laboratory data and studies,  CBC: Recent Labs  Lab 07/09/20 1302 07/11/20 0722 07/12/20 0332  WBC 9.9 7.0 9.5  NEUTROABS  --  4.3 5.7  HGB 13.9 13.4 13.5  HCT 42.0 42.2 42.3  MCV 90.3 95.0 92.8  PLT 392 295 365   Basic Metabolic Panel: Recent Labs  Lab 07/09/20 1302 07/11/20 0722 07/12/20 0332  NA 134* 136 134*  K 3.5 3.7 3.2*  CL 99 105 103  CO2 24 22 20*  GLUCOSE 109* 105* 105*  BUN 20 14 17   CREATININE 0.90 0.68 1.00  CALCIUM 9.4 8.7* 8.9   Liver Function Tests: Recent Labs  Lab 07/11/20 0722 07/12/20 0332  AST 19 16  ALT 15 14  ALKPHOS 144* 124  BILITOT 0.8 1.0   PROT 6.4* 6.4*  ALBUMIN 3.2* 3.2*   No results for input(s): LIPASE, AMYLASE in the last 168 hours. Recent Labs  Lab 07/11/20 0722  AMMONIA 32   Cardiac Enzymes: No results for input(s): CKTOTAL, CKMB, CKMBINDEX, TROPONINI in the last 168 hours. BNP (last 3 results) Recent Labs    07/11/20 0722  BNP 173.6*    ProBNP (last 3 results) No results for input(s): PROBNP in the last 8760 hours.  CBG: No results for input(s): GLUCAP in the last 168 hours. Recent Results (from the past 240 hour(s))  Respiratory Panel by RT PCR (Flu A&B, Covid) - Nasopharyngeal Swab     Status: None   Collection Time: 07/09/20  2:37 PM   Specimen: Nasopharyngeal Swab  Result Value Ref Range Status   SARS Coronavirus 2 by RT PCR NEGATIVE NEGATIVE Final    Comment: (NOTE) SARS-CoV-2 target nucleic acids are NOT DETECTED.  The SARS-CoV-2 RNA is generally  detectable in upper respiratoy specimens during the acute phase of infection. The lowest concentration of SARS-CoV-2 viral copies this assay can detect is 131 copies/mL. A negative result does not preclude SARS-Cov-2 infection and should not be used as the sole basis for treatment or other patient management decisions. A negative result may occur with  improper specimen collection/handling, submission of specimen other than nasopharyngeal swab, presence of viral mutation(s) within the areas targeted by this assay, and inadequate number of viral copies (<131 copies/mL). A negative result must be combined with clinical observations, patient history, and epidemiological information. The expected result is Negative.  Fact Sheet for Patients:  https://www.moore.com/  Fact Sheet for Healthcare Providers:  https://www.young.biz/  This test is no t yet approved or cleared by the [place] FDA and  has been authorized for detection and/or diagnosis of SARS-CoV-2 by FDA under an Emergency Use Authorization  (EUA). This EUA will remain  in effect (meaning this test can be used) for the duration of the COVID-19 declaration under Section 564(b)(1) of the Act, 21 U.S.C. section 360bbb-3(b)(1), unless the authorization is terminated or revoked sooner.     Influenza A by PCR NEGATIVE NEGATIVE Final   Influenza B by PCR NEGATIVE NEGATIVE Final    Comment: (NOTE) The Xpert Xpress SARS-CoV-2/FLU/RSV assay is intended as an aid in  the diagnosis of influenza from Nasopharyngeal swab specimens and  should not be used as a sole basis for treatment. Nasal washings and  aspirates are unacceptable for Xpert Xpress SARS-CoV-2/FLU/RSV  testing.  Fact Sheet for Patients: https://www.moore.com/  Fact Sheet for Healthcare Providers: https://www.young.biz/  This test is not yet approved or cleared by the [place] FDA and  has been authorized for detection and/or diagnosis of SARS-CoV-2 by  FDA under an Emergency Use Authorization (EUA). This EUA will remain  in effect (meaning this test can be used) for the duration of the  Covid-19 declaration under Section 564(b)(1) of the Act, 21  U.S.C. section 360bbb-3(b)(1), unless the authorization is  terminated or revoked. Performed at [facility], [address] [place], [place] [address]   Urine culture     Status: Abnormal   Collection Time: 07/09/20  2:55 PM   Specimen: Urine, Random  Result Value Ref Range Status   Specimen Description   Final    URINE, RANDOM Performed at [facility], [address] [place], [place] [address]    Special Requests   Final    NONE Performed at [facility], [address] [place], [place] [address]    Culture MULTIPLE SPECIES PRESENT, SUGGEST RECOLLECTION (A)  Final   Report Status 07/10/2020 FINAL  Final  SARS Coronavirus 2 by RT PCR (hospital order, performed in [facility] hospital lab) Nasopharyngeal Nasopharyngeal Swab     Status:  None   Collection Time: 07/10/20  4:34 PM   Specimen: Nasopharyngeal Swab  Result Value Ref Range Status   SARS Coronavirus 2 NEGATIVE NEGATIVE Final    Comment: (NOTE) SARS-CoV-2 target nucleic acids are NOT DETECTED.  The SARS-CoV-2 RNA is generally detectable in upper and lower respiratory specimens during the acute phase of infection. The lowest concentration of SARS-CoV-2 viral copies this assay can detect is 250 copies / mL. A negative result does not preclude SARS-CoV-2 infection and should not be used as the sole basis for treatment or other patient management decisions.  A negative result may occur with improper specimen collection / handling, submission  of specimen other than nasopharyngeal swab, presence of viral mutation(s) within the areas targeted by this assay, and inadequate number of viral copies (<250 copies / mL). A negative result must be combined with clinical observations, patient history, and epidemiological information.  Fact Sheet for Patients:   BoilerBrush.com.cy  Fact Sheet for Healthcare Providers: https://pope.com/  This test is not yet approved or  cleared by the [place] FDA and has been authorized for detection and/or diagnosis of SARS-CoV-2 by FDA under an Emergency Use Authorization (EUA).  This EUA will remain in effect (meaning this test can be used) for the duration of the COVID-19 declaration under Section 564(b)(1) of the Act, 21 U.S.C. section 360bbb-3(b)(1), unless the authorization is terminated or revoked sooner.  Performed at [facility], [address], [place], [place] [address]      Studies: DG CHEST PORT 1 VIEW  Result Date: 07/11/2020 CLINICAL DATA:  Shortness of breath EXAM: PORTABLE CHEST 1 VIEW COMPARISON:  July 09, 2020 chest radiograph and chest CT FINDINGS: There is atelectatic change in the left base. The lungs elsewhere are clear. Heart is upper  normal in size with pulmonary vascularity normal. There is aortic atherosclerosis. Bones are osteoporotic. Old healed fracture of the proximal left humeral metaphysis noted. Extensive postoperative change right proximal humerus with advanced arthropathy in the right shoulder. IMPRESSION: Left base atelectasis. Lungs elsewhere clear. Heart size within normal limits. Aortic Atherosclerosis (ICD10-I70.0). Bones osteoporotic with posttraumatic and arthropathic changes in the shoulders. Electronically Signed   By: Bretta Bang III M.D.   On: 07/11/2020 10:01   DG Knee Left Port  Result Date: 07/11/2020 CLINICAL DATA:  Generalized knee pain EXAM: PORTABLE LEFT KNEE - 1-2 VIEW COMPARISON:  None. FINDINGS: No evidence of fracture, dislocation, or joint effusion. Mild medial compartment osteoarthritis is seen with joint space loss and subchondral sclerosis. There is diffuse osteopenia. Soft tissues are unremarkable. IMPRESSION: No acute osseous abnormality. Electronically Signed   By: Jonna Clark M.D.   On: 07/11/2020 19:09   DG Knee Right Port  Result Date: 07/11/2020 CLINICAL DATA:  Generalized knee pain EXAM: PORTABLE RIGHT KNEE - 1-2 VIEW COMPARISON:  None. FINDINGS: No evidence of fracture, dislocation, or joint effusion. Mild medial compartment osteoarthritis is seen with superior joint space loss and subchondral sclerosis. There is diffuse osteopenia. Soft tissues are unremarkable. IMPRESSION: No acute osseous abnormality Electronically Signed   By: Jonna Clark M.D.   On: 07/11/2020 19:11      Joycelyn Das, MD  [facility] 07/12/2020

## 2020-07-13 DIAGNOSIS — G9341 Metabolic encephalopathy: Secondary | ICD-10-CM | POA: Diagnosis not present

## 2020-07-13 DIAGNOSIS — F419 Anxiety disorder, unspecified: Secondary | ICD-10-CM | POA: Diagnosis not present

## 2020-07-13 DIAGNOSIS — N3000 Acute cystitis without hematuria: Secondary | ICD-10-CM | POA: Diagnosis not present

## 2020-07-13 DIAGNOSIS — N949 Unspecified condition associated with female genital organs and menstrual cycle: Secondary | ICD-10-CM | POA: Diagnosis not present

## 2020-07-13 LAB — RESPIRATORY PANEL BY RT PCR (FLU A&B, COVID)
Influenza A by PCR: NEGATIVE
Influenza B by PCR: NEGATIVE
SARS Coronavirus 2 by RT PCR: NEGATIVE

## 2020-07-13 LAB — BASIC METABOLIC PANEL
Anion gap: 11 (ref 5–15)
BUN: 37 mg/dL — ABNORMAL HIGH (ref 8–23)
CO2: 22 mmol/L (ref 22–32)
Calcium: 9.3 mg/dL (ref 8.9–10.3)
Chloride: 104 mmol/L (ref 98–111)
Creatinine, Ser: 0.82 mg/dL (ref 0.44–1.00)
GFR calc non Af Amer: >60 mL/min (ref >60)
Glucose, Bld: 104 mg/dL — ABNORMAL HIGH (ref 70–99)
Potassium: 4.2 mmol/L (ref 3.5–5.1)
Sodium: 137 mmol/L (ref 135–145)

## 2020-07-13 MED ORDER — PANTOPRAZOLE SODIUM 40 MG PO TBEC
40.0000 mg | DELAYED_RELEASE_TABLET | Freq: Every day | ORAL | Status: DC
  Administered 2020-07-13: 40 mg via ORAL
  Filled 2020-07-13: qty 1

## 2020-07-13 MED ORDER — SODIUM CHLORIDE 0.9 % IV BOLUS
500.0000 mL | Freq: Once | INTRAVENOUS | Status: AC
  Administered 2020-07-13: 500 mL via INTRAVENOUS

## 2020-07-13 MED ORDER — FUROSEMIDE 20 MG PO TABS: 20.0000 mg | ORAL_TABLET | Freq: Every day | ORAL | Status: DC | PRN

## 2020-07-13 NOTE — Progress Notes (Signed)
PHARMACIST - PHYSICIAN COMMUNICATION  DR:   Tyson Babinski  CONCERNING: IV to Oral Route Change Policy  RECOMMENDATION: This patient is receiving protonix by the intravenous route.  Based on criteria approved by the Pharmacy and Therapeutics Committee, the intravenous medication(s) is/are being converted to the equivalent oral dose form(s).   DESCRIPTION: These criteria include:  The patient is eating (either orally or via tube) and/or has been taking other orally administered medications for a least 24 hours  The patient has no evidence of active gastrointestinal bleeding or impaired GI absorption (gastrectomy, short bowel, patient on TNA or NPO).  If you have questions about this conversion, please contact the Pharmacy Department  []   [phone] )  ( [phone] []   [phone] )  [facility] []   [phone] )  [facility] []   [phone] )  [facility] [x]   [phone] )  [facility]   07/13/2020 8:18 AM  , PharmD, BCPS

## 2020-07-13 NOTE — Progress Notes (Addendum)
Mccoy, Laxman, MD paged regarding the pt's BP of 84/62. Pt asymptomatic.  MD also notified regarding the pt's low urinary output throughout the night and this am.   New orders: 500 ml bolus over 2 hours, recheck BP once finished.  BP post bolus: 125/69

## 2020-07-13 NOTE — Discharge Summary (Signed)
Physician Discharge Summary  Sheila Mccoy SJG:[id number] DOB: 1931/06/26 DOA: 07/09/2020  PCP: Margot Ables, MD  Admit date: 07/09/2020 Discharge date: 07/13/2020  Admitted From: Home  Discharge disposition: SNF  Recommendations for Outpatient Follow-Up:   . Follow up with your primary care provider in one week.  . Check CBC, BMP, magnesium in the next visit  Discharge Diagnosis:   Active Problems:   UTI (urinary tract infection)   Acute metabolic encephalopathy   Sacral fracture (HCC)   COPD (chronic obstructive pulmonary disease) (HCC)   Adnexal cyst   Anxiety   Discharge Condition: Improved.  Diet recommendation: Low sodium, heart healthy.   Wound care: None.  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 Work-up revealed presumed UTI,hypovolemic hyponatremia, sacral fracture. Due to dyspnea with chronic DVT, a CTA PE study was done, it was negative for pulmonary embolism.  Patient persisted to have severe pain requiring IV narcotics and Toradol plus encephalopathy requiring further evaluation in the hospital.   Hospital Course:   Following conditions were addressed during hospitalization as listed below,  Presumed UTI, present on admission. Improved at this time. Patient did have urinary symptoms including frequency of urination and had confusion and urinalysis was positive for white cells.  Empirically received IV Rocephin and completed 3-day course.  Urine culture showed multiple species.  Acute metabolic encephalopathy likely multifactorial from  UTI, volume depletion and use of IV narcotics..   CT head scan showed generalized cerebral atrophy.  TSH was normal.  Patient received IV  fluid hydration.  Patient required IV narcotics for severe pain with significant confusion which has been on hold.    Sacral fracture with intractable pain.  Post fall 1 month ago.  Has had intractable pain requiring IV narcotic x1 followed by confusion.  Continue gabapentin and tramadol on discharge. Patient has been seen by physical therapy who has recommended skilled nursing facility placement at this time.  CT scan of the pelvis showed  acute fracture involving the inferior aspect of the sacrum at the level of S5.  Chronic anxiety, Psychiatry was consulted with  no specific or further recommendation on medications given at this time.  Continue home medications.  Dyspnea/tachypnea with history ofchronic DVT/ non hypoxic CT angiogram of the chest this time was negative for PE.  Could be exacerbated by anxiety. Patient does have history of COPD and might benefit from oxygen intermittently.  Large right adnexal cyst, likely ovarian in origin Pelvis ultrasound done on 07/09/2020 showed: Probable right ovarian cyst.  Follow-up in 6 months was recommended.  COPD On 2 L of nasal cannula oxygen.  Continue to wean oxygen as able.  Continue bronchodilators.  Hypothyroidism Continue levothyroxine.  Moderate malnutrition present on admission. Would benefit from nutritional supplements  Disposition.  At this time, patient is stable for disposition to skilled nursing facility. I spoke with the patient's daughter on the phone and updated her about the clinical condition of the patient and the plan for disposition.  Medical Consultants:    Orthopedic surgery  Procedures:    None Subjective:   Today, patient was seen and examined at bedside. Is more alert awake communicative. Complains of mild pain on the back.  Discharge Exam:   Vitals:   07/13/20  1152 07/13/20 1230  BP:  125/69  Pulse: 70   Resp:    Temp:    SpO2: 99%    Vitals:   07/13/20 0830 07/13/20 0936 07/13/20 1152  07/13/20 1230  BP:  (!) 84/62  125/69  Pulse: 79 74 70   Resp:  18    Temp:  98 F (36.7 C)    TempSrc:  Oral    SpO2: 100% 98% 99%   Weight:      Height:       General: Alert awake, not in obvious distress HENT: pupils equally reacting to light,  No scleral pallor or icterus noted. Oral mucosa is moist.  Chest:  Clear breath sounds.  Diminished breath sounds bilaterally. No crackles or wheezes.  CVS: S1 &S2 heard. No murmur.  Regular rate and rhythm. Abdomen: Soft, nontender, nondistended.  Bowel sounds are heard.   Extremities: No cyanosis, clubbing or edema.  Peripheral pulses are palpable. Psych: Alert, awake and communicative.  CNS:  No cranial nerve deficits.  Power equal in all extremities. Mild generalized weakness noted over the extremities. Skin: Warm and dry.  No rashes noted.  The results of significant diagnostics from this hospitalization (including imaging, microbiology, ancillary and laboratory) are listed below for reference.     Diagnostic Studies:   CT Head Wo Contrast  Result Date: 07/09/2020 CLINICAL DATA:  Status post fall 1 month ago. EXAM: CT HEAD WITHOUT CONTRAST TECHNIQUE: Contiguous axial images were obtained from the base of the skull through the vertex without intravenous contrast. COMPARISON:  None. FINDINGS: Brain: There is mild cerebral atrophy with widening of the extra-axial spaces and ventricular dilatation. There are areas of decreased attenuation within the white matter tracts of the supratentorial brain, consistent with microvascular disease changes. Vascular: No hyperdense vessel or unexpected calcification. Skull: Normal. Negative for fracture or focal lesion. Sinuses/Orbits: No acute finding. Other: None. IMPRESSION: 1. Generalized cerebral atrophy. 2. No acute intracranial abnormality. Electronically Signed   By: Aram Candela M.D.   On: 07/09/2020 16:03   CT ANGIO CHEST PE W OR WO CONTRAST  Result Date: 07/09/2020 CLINICAL DATA:   Shortness of breath, PE suspected EXAM: CT ANGIOGRAPHY CHEST WITH CONTRAST TECHNIQUE: Multidetector CT imaging of the chest was performed using the standard protocol during bolus administration of intravenous contrast. Multiplanar CT image reconstructions and MIPs were obtained to evaluate the vascular anatomy. CONTRAST:  OMNIPAQUE IOHEXOL 350 MG/ML SOLN COMPARISON:  June 14, 2020 FINDINGS: Cardiovascular: There is a optimal opacification of the pulmonary arteries. There is no central,segmental, or subsegmental filling defects within the pulmonary arteries. There is mild cardiomegaly present. No pericardial effusion or thickening. No evidence right heart strain. There is normal three-vessel brachiocephalic anatomy without proximal stenosis. Scattered aortic atherosclerosis is noted. Coronary artery calcifications are seen. Mediastinum/Nodes: No hilar, mediastinal, or axillary adenopathy. Thyroid gland, trachea, and esophagus demonstrate no significant findings. Lungs/Pleura: Calcified granuloma seen at the right lung base. There is a 7 mm ill-defined nodular opacity seen at the left lung apex which is stable from the prior exam, likely focal area of scarring. There is mild streaky airspace opacity seen at both lung bases. No large airspace consolidation or pleural effusion. There is also pleural calcifications seen at the the right lung apex. Upper Abdomen: No acute abnormalities present in the visualized portions of the upper abdomen. Musculoskeletal: No chest wall abnormality. No acute or significant osseous findings. There is a chronic endplate compression deformity of the L1 vertebral body with less  than 25% loss in height. Review of the MIP images confirms the above findings. IMPRESSION: No central, segmental, or subsegmental pulmonary embolism. Mild bibasilar streaky airspace opacity, likely atelectasis or chronic lung changes. No other acute intrathoracic pathology to explain the patient's symptoms.  Electronically Signed   By: Jonna Clark M.D.   On: 07/09/2020 23:41   CT Lumbar Spine Wo Contrast  Result Date: 07/09/2020 CLINICAL DATA:  Status post fall 1 month ago. EXAM: CT LUMBAR SPINE WITHOUT CONTRAST TECHNIQUE: Multidetector CT imaging of the lumbar spine was performed without intravenous contrast administration. Multiplanar CT image reconstructions were also generated. COMPARISON:  None. FINDINGS: Segmentation: 5 lumbar type vertebrae. Alignment: There is moderate severity levoscoliosis. Vertebrae: A chronic compression fracture deformity is seen involving predominantly the inferior endplate of the L1 vertebral body. Paraspinal and other soft tissues: Negative. Disc levels: Moderate severity multilevel endplate sclerosis is seen throughout the lumbar spine. Moderate severity multilevel intervertebral disc space narrowing is seen with associated vacuum disc phenomenon noted at the levels of L1-L2, L2-L3, L3-L4 and L4-L5. Mild to moderate severity bilateral multilevel facet joint hypertrophy is noted. IMPRESSION: 1. Chronic compression fracture deformity involving predominantly the inferior endplate of the L1 vertebral body. 2. Moderate severity levoscoliosis. 3. Moderate to marked severity multilevel degenerative disc disease. Electronically Signed   By: Aram Candela M.D.   On: 07/09/2020 16:13   CT PELVIS WO CONTRAST  Result Date: 07/09/2020 CLINICAL DATA:  Status post fall 1 month ago. EXAM: CT PELVIS WITHOUT CONTRAST TECHNIQUE: Multidetector CT imaging of the pelvis was performed following the standard protocol without intravenous contrast. COMPARISON:  None. FINDINGS: Urinary Tract:  The urinary bladder is markedly distended. Bowel: There is no evidence of bowel dilatation. Noninflamed diverticula are seen throughout the large bowel. Vascular/Lymphatic: There is moderate severity calcification of the abdominal aorta and bilateral common iliac arteries, without evidence of aneurysmal  dilatation. Reproductive: The uterus is normal in size and appearance. A 7.2 cm x 3.3 cm cystic appearing area is seen within the right adnexa. Mass effect is seen on the uterus. Other:  None. Musculoskeletal: An acute mildly displaced fracture is seen involving the inferior aspect of the sacrum, at the approximate level of S5. IMPRESSION: 1. Acute fracture involving the inferior aspect of the sacrum, at the approximate level of S5. 2. Large right adnexal cyst, likely ovarian in origin. Further evaluation with pelvic ultrasound is recommended. 3. Colonic diverticulosis. Aortic Atherosclerosis (ICD10-I70.0). Electronically Signed   By: Aram Candela M.D.   On: 07/09/2020 16:09   US PELVIS (TRANSABDOMINAL ONLY)  Result Date: 07/09/2020 CLINICAL DATA:  Adnexal cyst EXAM: TRANSABDOMINAL ULTRASOUND OF PELVIS TECHNIQUE: Transabdominal ultrasound examination of the pelvis was performed including evaluation of the uterus, ovaries, adnexal regions, and pelvic cul-de-sac. COMPARISON:  CT same day FINDINGS: Uterus Measurements: 6.2 x 1.8 x 2.8 cm = volume: 16.7 mL. No fibroids or other mass visualized. Endometrium Thickness: 1.8 mm.  No focal abnormality visualized. Right ovary Measurements: 8.8 x 4.0 x 7.7 cm = volume: 142 mL. There is an anechoic cystic structures likely seen within the right ovary measuring 8.8 x 4.0 x 7.7 cm. No definite internal vascularity seen. Left ovary Measurements: Not visualized Other findings:  No abnormal free fluid. IMPRESSION: Probable right ovarian cyst as described above. This has benign characteristics, but given its size, follow up by ultrasound is recommended in 6 months. This recommendation follows consensus guidelines: Simple Adnexal Cysts: SRU Consensus Conference Update on Follow-up and Reporting. Radiology 2019; [id number]. Electronically  Signed   By: Jonna Clark M.D.   On: 07/09/2020 22:46   DG Chest Portable 1 View  Result Date: 07/09/2020 CLINICAL DATA:  Shortness of  breath. EXAM: PORTABLE CHEST 1 VIEW COMPARISON:  May 19, 2020. FINDINGS: The heart size and mediastinal contours are within normal limits. No pneumothorax is noted. Right lung is clear. Mild left basilar subsegmental atelectasis and left pleural effusion is noted. The visualized skeletal structures are unremarkable. IMPRESSION: Mild left basilar subsegmental atelectasis and left pleural effusion. Electronically Signed   By: Lupita Raider M.D.   On: 07/09/2020 15:46     Labs:   Basic Metabolic Panel: Recent Labs  Lab 07/09/20 1302 07/09/20 1302 07/11/20 0722 07/11/20 0722 07/12/20 0332 07/13/20 0805  NA 134*  --  136  --  134* 137  K 3.5   < > 3.7   < > 3.2* 4.2  CL 99  --  105  --  103 104  CO2 24  --  22  --  20* 22  GLUCOSE 109*  --  105*  --  105* 104*  BUN 20  --  14  --  17 37*  CREATININE 0.90  --  0.68  --  1.00 0.82  CALCIUM 9.4  --  8.7*  --  8.9 9.3   < > = values in this interval not displayed.   GFR Estimated Creatinine Clearance: 43.3 mL/min (by C-G formula based on SCr of 0.82 mg/dL). Liver Function Tests: Recent Labs  Lab 07/11/20 0722 07/12/20 0332  AST 19 16  ALT 15 14  ALKPHOS 144* 124  BILITOT 0.8 1.0  PROT 6.4* 6.4*  ALBUMIN 3.2* 3.2*   No results for input(s): LIPASE, AMYLASE in the last 168 hours. Recent Labs  Lab 07/11/20 0722  AMMONIA 32   Coagulation profile No results for input(s): INR, PROTIME in the last 168 hours.  CBC: Recent Labs  Lab 07/09/20 1302 07/11/20 0722 07/12/20 0332  WBC 9.9 7.0 9.5  NEUTROABS  --  4.3 5.7  HGB 13.9 13.4 13.5  HCT 42.0 42.2 42.3  MCV 90.3 95.0 92.8  PLT 392 295 365   Cardiac Enzymes: No results for input(s): CKTOTAL, CKMB, CKMBINDEX, TROPONINI in the last 168 hours. BNP: Invalid input(s): POCBNP CBG: No results for input(s): GLUCAP in the last 168 hours. D-Dimer No results for input(s): DDIMER in the last 72 hours. Hgb A1c No results for input(s): HGBA1C in the last 72 hours. Lipid  Profile No results for input(s): CHOL, HDL, LDLCALC, TRIG, CHOLHDL, LDLDIRECT in the last 72 hours. Thyroid function studies Recent Labs    07/10/20 1427  TSH 2.676   Anemia work up No results for input(s): VITAMINB12, FOLATE, FERRITIN, TIBC, IRON, RETICCTPCT in the last 72 hours. Microbiology Recent Results (from the past 240 hour(s))  Respiratory Panel by RT PCR (Flu A&B, Covid) - Nasopharyngeal Swab     Status: None   Collection Time: 07/09/20  2:37 PM   Specimen: Nasopharyngeal Swab  Result Value Ref Range Status   SARS Coronavirus 2 by RT PCR NEGATIVE NEGATIVE Final    Comment: (NOTE) SARS-CoV-2 target nucleic acids are NOT DETECTED.  The SARS-CoV-2 RNA is generally detectable in upper respiratoy specimens during the acute phase of infection. The lowest concentration of SARS-CoV-2 viral copies this assay can detect is 131 copies/mL. A negative result does not preclude SARS-Cov-2 infection and should not be used as the sole basis for treatment or other patient management decisions. A  negative result may occur with  improper specimen collection/handling, submission of specimen other than nasopharyngeal swab, presence of viral mutation(s) within the areas targeted by this assay, and inadequate number of viral copies (<131 copies/mL). A negative result must be combined with clinical observations, patient history, and epidemiological information. The expected result is Negative.  Fact Sheet for Patients:  https://www.moore.com/  Fact Sheet for Healthcare Providers:  https://www.young.biz/  This test is no t yet approved or cleared by the [place] FDA and  has been authorized for detection and/or diagnosis of SARS-CoV-2 by FDA under an Emergency Use Authorization (EUA). This EUA will remain  in effect (meaning this test can be used) for the duration of the COVID-19 declaration under Section 564(b)(1) of the Act, 21 U.S.C. section  360bbb-3(b)(1), unless the authorization is terminated or revoked sooner.     Influenza A by PCR NEGATIVE NEGATIVE Final   Influenza B by PCR NEGATIVE NEGATIVE Final    Comment: (NOTE) The Xpert Xpress SARS-CoV-2/FLU/RSV assay is intended as an aid in  the diagnosis of influenza from Nasopharyngeal swab specimens and  should not be used as a sole basis for treatment. Nasal washings and  aspirates are unacceptable for Xpert Xpress SARS-CoV-2/FLU/RSV  testing.  Fact Sheet for Patients: https://www.moore.com/  Fact Sheet for Healthcare Providers: https://www.young.biz/  This test is not yet approved or cleared by the [place] FDA and  has been authorized for detection and/or diagnosis of SARS-CoV-2 by  FDA under an Emergency Use Authorization (EUA). This EUA will remain  in effect (meaning this test can be used) for the duration of the  Covid-19 declaration under Section 564(b)(1) of the Act, 21  U.S.C. section 360bbb-3(b)(1), unless the authorization is  terminated or revoked. Performed at [facility], [address] [place], [place] [address]   Urine culture     Status: Abnormal   Collection Time: 07/09/20  2:55 PM   Specimen: Urine, Random  Result Value Ref Range Status   Specimen Description   Final    URINE, RANDOM Performed at [facility], [address] [place], [place] [address]    Special Requests   Final    NONE Performed at [facility], [address] [place], [place] [address]    Culture MULTIPLE SPECIES PRESENT, SUGGEST RECOLLECTION (A)  Final   Report Status 07/10/2020 FINAL  Final  SARS Coronavirus 2 by RT PCR (hospital order, performed in [facility] hospital lab) Nasopharyngeal Nasopharyngeal Swab     Status: None   Collection Time: 07/10/20  4:34 PM   Specimen: Nasopharyngeal Swab  Result Value Ref Range Status   SARS Coronavirus 2 NEGATIVE NEGATIVE Final    Comment:  (NOTE) SARS-CoV-2 target nucleic acids are NOT DETECTED.  The SARS-CoV-2 RNA is generally detectable in upper and lower respiratory specimens during the acute phase of infection. The lowest concentration of SARS-CoV-2 viral copies this assay can detect is 250 copies / mL. A negative result does not preclude SARS-CoV-2 infection and should not be used as the sole basis for treatment or other patient management decisions.  A negative result may occur with improper specimen collection / handling, submission of specimen other than nasopharyngeal swab, presence of viral mutation(s) within the areas targeted by this assay, and inadequate number of viral copies (<250 copies / mL). A negative result must be combined with clinical observations, patient history, and epidemiological information.  Fact Sheet for Patients:   BoilerBrush.com.cy  Fact Sheet for Healthcare Providers: https://pope.com/https://www.fda.gov/media/136313/download  This test is not yet approved or  cleared by the [place] FDA and has been authorized for detection and/or diagnosis of SARS-CoV-2 by FDA under an Emergency Use Authorization (EUA).  This EUA will remain in effect (meaning this test can be used) for the duration of the COVID-19 declaration under Section 564(b)(1) of the Act, 21 U.S.C. section 360bbb-3(b)(1), unless the authorization is terminated or revoked sooner.  Performed at [facility], [address] [place], [place] [address]   Respiratory Panel by RT PCR (Flu A&B, Covid) - Nasopharyngeal Swab     Status: None   Collection Time: 07/13/20 10:25 AM   Specimen: Nasopharyngeal Swab  Result Value Ref Range Status   SARS Coronavirus 2 by RT PCR NEGATIVE NEGATIVE Final    Comment: (NOTE) SARS-CoV-2 target nucleic acids are NOT DETECTED.  The SARS-CoV-2 RNA is generally detectable in upper respiratoy specimens during the acute phase of infection. The lowest concentration  of SARS-CoV-2 viral copies this assay can detect is 131 copies/mL. A negative result does not preclude SARS-Cov-2 infection and should not be used as the sole basis for treatment or other patient management decisions. A negative result may occur with  improper specimen collection/handling, submission of specimen other than nasopharyngeal swab, presence of viral mutation(s) within the areas targeted by this assay, and inadequate number of viral copies (<131 copies/mL). A negative result must be combined with clinical observations, patient history, and epidemiological information. The expected result is Negative.  Fact Sheet for Patients:  https://www.moore.com/https://www.fda.gov/media/142436/download  Fact Sheet for Healthcare Providers:  https://www.young.biz/https://www.fda.gov/media/142435/download  This test is no t yet approved or cleared by the [place] FDA and  has been authorized for detection and/or diagnosis of SARS-CoV-2 by FDA under an Emergency Use Authorization (EUA). This EUA will remain  in effect (meaning this test can be used) for the duration of the COVID-19 declaration under Section 564(b)(1) of the Act, 21 U.S.C. section 360bbb-3(b)(1), unless the authorization is terminated or revoked sooner.     Influenza A by PCR NEGATIVE NEGATIVE Final   Influenza B by PCR NEGATIVE NEGATIVE Final    Comment: (NOTE) The Xpert Xpress SARS-CoV-2/FLU/RSV assay is intended as an aid in  the diagnosis of influenza from Nasopharyngeal swab specimens and  should not be used as a sole basis for treatment. Nasal washings and  aspirates are unacceptable for Xpert Xpress SARS-CoV-2/FLU/RSV  testing.  Fact Sheet for Patients: https://www.moore.com/https://www.fda.gov/media/142436/download  Fact Sheet for Healthcare Providers: https://www.young.biz/https://www.fda.gov/media/142435/download  This test is not yet approved or cleared by the [place] FDA and  has been authorized for detection and/or diagnosis of SARS-CoV-2 by  FDA under an Emergency Use  Authorization (EUA). This EUA will remain  in effect (meaning this test can be used) for the duration of the  Covid-19 declaration under Section 564(b)(1) of the Act, 21  U.S.C. section 360bbb-3(b)(1), unless the authorization is  terminated or revoked. Performed at [facility], [address] [place], [place] [address]      Discharge Instructions:   Discharge Instructions    Diet - low sodium heart healthy   Complete by: As directed    Discharge instructions   Complete by: As directed    Follow up with your primary care provider at the SNF in one week. Continue physical therapy  at the rehab.   Increase activity slowly   Complete by: As directed      Allergies as of 07/13/2020  Reactions   Macrobid [nitrofurantoin] Other (See Comments)   tired      Medication List    STOP taking these medications   cyclobenzaprine 5 MG tablet Commonly known as: FLEXERIL     TAKE these medications   albuterol 108 (90 Base) MCG/ACT inhaler Commonly known as: VENTOLIN HFA Inhale 1-2 puffs into the lungs every 6 (six) hours as needed for wheezing or shortness of breath.   Aspirin 81 81 MG chewable tablet Generic drug: aspirin Chew 81 mg by mouth daily.   bisacodyl 10 MG suppository Commonly known as: DULCOLAX Place 1 suppository (10 mg total) rectally daily as needed for moderate constipation.   budesonide-formoterol 80-4.5 MCG/ACT inhaler Commonly known as: SYMBICORT Inhale 1 puff into the lungs 2 (two) times daily.   buPROPion 75 MG tablet Commonly known as: WELLBUTRIN Take 75 mg by mouth daily.   diclofenac Sodium 1 % Gel Commonly known as: VOLTAREN Apply 2 g topically 3 (three) times daily as needed (pain).   dorzolamide-timolol 22.3-6.8 MG/ML ophthalmic solution Commonly known as: COSOPT Place 1 drop into the left eye 2 (two) times daily.   furosemide 20 MG tablet Commonly known as: LASIX Take 1 tablet (20 mg total) by mouth daily as needed  for fluid or edema. Start taking on: July 16, 2020 What changed:   when to take this  reasons to take this  These instructions start on July 16, 2020. If you are unsure what to do until then, ask your doctor or other care provider.   gabapentin 100 MG capsule Commonly known as: NEURONTIN Take 100 mg by mouth 3 (three) times daily.   hydrOXYzine 25 MG capsule Commonly known as: VISTARIL Take 25 mg by mouth 2 (two) times daily as needed (for shortness of breath).   latanoprost 0.005 % ophthalmic solution Commonly known as: XALATAN Place 1 drop into both eyes at bedtime.   levothyroxine 75 MCG tablet Commonly known as: SYNTHROID Take by mouth.   mirtazapine 15 MG tablet Commonly known as: REMERON Take 15 mg by mouth at bedtime.   pantoprazole 40 MG tablet Commonly known as: Protonix Take 1 tablet (40 mg total) by mouth daily.   polyethylene glycol 17 g packet Commonly known as: MIRALAX / GLYCOLAX Take 17 g by mouth daily as needed for moderate constipation.   potassium chloride SA 20 MEQ tablet Commonly known as: KLOR-CON Take 1 tablet (20 mEq total) by mouth daily for 5 days.   risperiDONE 0.5 MG tablet Commonly known as: RISPERDAL Take 0.5 mg by mouth at bedtime.   traMADol 50 MG tablet Commonly known as: ULTRAM Take 1 tablet (50 mg total) by mouth every 12 (twelve) hours for 5 days.       Contact information for after-discharge care    Destination    [facility] Preferred SNF .   Service: Skilled Nursing Contact information: [address] [place] [address] [phone]                   Time coordinating discharge: 39 minutes  Signed:  Lilybeth Vien  [facility] 07/13/2020, 1:23 PM

## 2020-07-13 NOTE — Care Management Important Message (Signed)
Important Message  Patient Details IM Letter given to the Patient Name: Sheila Mccoy MRN: [id number] Date of Birth: March 17, 1931   Medicare Important Message Given:  Yes     Caren Macadam 07/13/2020, 12:50 PM

## 2020-07-13 NOTE — Progress Notes (Signed)
Lanora Manis, the receiving nurse at [facility], was given report on the pt. Currently waiting for PTAR for transport to the facility.

## 2020-07-13 NOTE — TOC Transition Note (Addendum)
Transition of Care [facility]) - CM/SW Discharge Note   Patient Details  Name: Sheila Mccoy MRN: [id number] Date of Birth: April 27, 1931  Transition of Care [facility]) CM/SW Contact:  Clearance Coots, LCSW Phone Number: 07/13/2020, 11:20 AM   Clinical Narrative:  Covid test negative   [facility] Room 115 Nurse call report to: [phone] Daughter Diedre notified.   PTAR arranged for transport.    Final next level of care: Skilled Nursing Facility Barriers to Discharge: Barriers Resolved   Patient Goals and CMS Choice Patient states their goals for this hospitalization and ongoing recovery are:: go to snf for rehab CMS Medicare.gov Compare Post Acute Care list provided to:: Patient Choice offered to / list presented to : Patient  Discharge Placement PASRR number recieved: 07/11/20            Patient chooses bed at: [facility] Patient to be transferred to facility by: PTAR Name of family member notified: Daughter Deidre Patient and family notified of of transfer: 07/13/20  Discharge Plan and Services In-house Referral: Clinical Social Work   Post Acute Care Choice: Skilled Nursing Facility          DME Arranged: N/A         HH Arranged: NA HH Agency: NA        Social Determinants of Health (SDOH) Interventions     Readmission Risk Interventions No flowsheet data found.

## 2020-07-13 NOTE — Progress Notes (Signed)
Occupational Therapy Treatment Patient Details Name: Sheila Mccoy MRN: [id number] DOB: July 27, 1931 Today's Date: 07/13/2020    History of present illness 86 y.o. 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 Pt found to have UTI and CT pelvis shows acute fracture involving the inferior aspect of the sacrum   OT comments  Treatment focused on improving functional mobility and independence with self care tasks. Patient able to transfer into sitting but didn't have the strength to scoot herself to the edge of the bed. Patient min assist for standing from bed height each time during ADL tasks and for transfer to recliner. Patient max assist for toileting and lower body dressing. Patient limited by fatigue, back pain and reporting shortness of breath despite WNL o2 sats. Patient unaware of bowel movement during bathing task. Patient able to take small shuffling steps to recliner with RW. Patient fatigued and needed assistance to scoot back in chair. Continue to recommend short term rehab at discharge due to continued weakness, poor activity tolerance, limited mobility and decreased ability to perform ADLs.  Follow Up Recommendations  SNF    Equipment Recommendations  None recommended by OT    Recommendations for Other Services      Precautions / Restrictions Precautions Precautions: Fall Precaution Comments: monitor o2 sats Restrictions Weight Bearing Restrictions: No LLE Weight Bearing: Weight bearing as tolerated Other Position/Activity Restrictions: WBAT per ortho note       Mobility Bed Mobility Overal bed mobility: Needs Assistance Bed Mobility: Supine to Sit     Supine to sit: Min assist     General bed mobility comments: Patient able to transfer into sitting with increased time and use of bed rail but unableto scoot forward.  Transfers Overall transfer level: Needs assistance Equipment used: Rolling walker  (2 wheeled) Transfers: Sit to/from [employer] Sit to Stand: Min assist Stand pivot transfers: Min guard       General transfer comment: min assist for each sit to stand from bed height. min guard to take steps to recliner. limited by fatigue and back pain.    Balance Overall balance assessment: Needs assistance Sitting-balance support: No upper extremity supported;Feet supported Sitting balance-Leahy Scale: Good     Standing balance support: During functional activity Standing balance-Leahy Scale: Fair Standing balance comment: able to remove hands from walker for ADL                           ADL either performed or assessed with clinical judgement   ADL       Grooming: Set up;Wash/dry face;Sitting Grooming Details (indicate cue type and reason): washed face sitting at side of bed. Upper Body Bathing: Set up;Sitting Upper Body Bathing Details (indicate cue type and reason): patient washed up sitting at side of bed. cues for quality of task. Lower Body Bathing: Moderate assistance Lower Body Bathing Details (indicate cue type and reason): Patient able to begin washing periarea and buttocks as well as top of legs. However, patient unaware of BM movement. Patient physically able to perform task but Therapist had to assist with task as patietn fatigued with standing to clean area. Patient reporting back pain as well. Mod assist for full cleaning task.     Lower Body Dressing: Maximal assistance Lower Body Dressing Details (indicate cue type and reason): Patient needed assistance to doff underwear and donn clean pair.     Toileting- Clothing Manipulation and Hygiene:  Maximal assistance;Sit to/from stand;Sitting/lateral lean Toileting - Clothing Manipulation Details (indicate cue type and reason): max assist for periarea cleaning and clothing managemetn secondary to fatigue and complaints of pain.             Vision Patient Visual Report: No change  from baseline     Perception     Praxis      Cognition Arousal/Alertness: Awake/alert Behavior During Therapy: WFL for tasks assessed/performed Overall Cognitive Status: Within Functional Limits for tasks assessed                                          Exercises     Shoulder Instructions       General Comments      Pertinent Vitals/ Pain       Pain Assessment: Faces Faces Pain Scale: Hurts little more Pain Location: "back" Pain Descriptors / Indicators: Sore Pain Intervention(s): Monitored during session  Home Living                                          Prior Functioning/Environment              Frequency  Min 2X/week        Progress Toward Goals  OT Goals(current goals can now be found in the care plan section)  Progress towards OT goals: Progressing toward goals  Acute Rehab OT Goals Patient Stated Goal: get well OT Goal Formulation: With patient Time For Goal Achievement: 07/24/20 Potential to Achieve Goals: Good  Plan Discharge plan remains appropriate    Co-evaluation                 AM-PAC OT "6 Clicks" Daily Activity     Outcome Measure   Help from another person eating meals?: A Little Help from another person taking care of personal grooming?: A Little Help from another person toileting, which includes using toliet, bedpan, or urinal?: A Lot Help from another person bathing (including washing, rinsing, drying)?: A Lot Help from another person to put on and taking off regular upper body clothing?: A Little Help from another person to put on and taking off regular lower body clothing?: A Lot 6 Click Score: 15    End of Session Equipment Utilized During Treatment: Oxygen;Rolling walker  OT Visit Diagnosis: Unsteadiness on feet (R26.81);Muscle weakness (generalized) (M62.81);History of falling (Z91.81)   Activity Tolerance Patient tolerated treatment well   Patient Left in chair;with  call bell/phone within reach;with nursing/sitter in room   Nurse Communication Mobility status        Time: 2633-3545 OT Time Calculation (min): 25 min  Charges: OT General Charges $OT Visit: 1 Visit OT Treatments $Self Care/Home Management : 23-37 mins  Blaze Nylund, OTR/L Acute Care Rehab Services  Office [phone] Pager: [phone]    Kelli Churn 07/13/2020, 9:45 AM

## 2020-08-02 ENCOUNTER — Telehealth (HOSPITAL_COMMUNITY): Payer: Self-pay | Admitting: Family Medicine

## 2020-08-04 ENCOUNTER — Ambulatory Visit (HOSPITAL_COMMUNITY): Payer: Medicare HMO

## 2020-09-29 ENCOUNTER — Other Ambulatory Visit: Payer: Self-pay

## 2020-09-29 ENCOUNTER — Inpatient Hospital Stay (HOSPITAL_BASED_OUTPATIENT_CLINIC_OR_DEPARTMENT_OTHER)
Admission: EM | Admit: 2020-09-29 | Discharge: 2020-10-02 | DRG: 069 | Disposition: A | Discharge status: Home / Self Care | Payer: Medicare Other | Attending: Internal Medicine | Admitting: Internal Medicine

## 2020-09-29 ENCOUNTER — Emergency Department (HOSPITAL_BASED_OUTPATIENT_CLINIC_OR_DEPARTMENT_OTHER): Payer: Medicare Other

## 2020-09-29 ENCOUNTER — Encounter (HOSPITAL_BASED_OUTPATIENT_CLINIC_OR_DEPARTMENT_OTHER): Payer: Self-pay

## 2020-09-29 DIAGNOSIS — Z801 Family history of malignant neoplasm of trachea, bronchus and lung: Secondary | ICD-10-CM

## 2020-09-29 DIAGNOSIS — E039 Hypothyroidism, unspecified: Secondary | ICD-10-CM | POA: Diagnosis not present

## 2020-09-29 DIAGNOSIS — F32A Depression, unspecified: Secondary | ICD-10-CM | POA: Diagnosis not present

## 2020-09-29 DIAGNOSIS — Z79890 Hormone replacement therapy (postmenopausal): Secondary | ICD-10-CM | POA: Diagnosis not present

## 2020-09-29 DIAGNOSIS — R29701 NIHSS score 1: Secondary | ICD-10-CM | POA: Diagnosis present

## 2020-09-29 DIAGNOSIS — F419 Anxiety disorder, unspecified: Secondary | ICD-10-CM | POA: Diagnosis not present

## 2020-09-29 DIAGNOSIS — Z881 Allergy status to other antibiotic agents status: Secondary | ICD-10-CM | POA: Diagnosis not present

## 2020-09-29 DIAGNOSIS — Z8673 Personal history of transient ischemic attack (TIA), and cerebral infarction without residual deficits: Secondary | ICD-10-CM | POA: Diagnosis present

## 2020-09-29 DIAGNOSIS — Z79899 Other long term (current) drug therapy: Secondary | ICD-10-CM | POA: Diagnosis not present

## 2020-09-29 DIAGNOSIS — I639 Cerebral infarction, unspecified: Secondary | ICD-10-CM

## 2020-09-29 DIAGNOSIS — Z66 Do not resuscitate: Secondary | ICD-10-CM | POA: Diagnosis not present

## 2020-09-29 DIAGNOSIS — J449 Chronic obstructive pulmonary disease, unspecified: Secondary | ICD-10-CM | POA: Diagnosis present

## 2020-09-29 DIAGNOSIS — E785 Hyperlipidemia, unspecified: Secondary | ICD-10-CM | POA: Diagnosis present

## 2020-09-29 DIAGNOSIS — G459 Transient cerebral ischemic attack, unspecified: Secondary | ICD-10-CM

## 2020-09-29 DIAGNOSIS — Z7951 Long term (current) use of inhaled steroids: Secondary | ICD-10-CM

## 2020-09-29 DIAGNOSIS — Z885 Allergy status to narcotic agent status: Secondary | ICD-10-CM

## 2020-09-29 DIAGNOSIS — R4701 Aphasia: Secondary | ICD-10-CM | POA: Diagnosis present

## 2020-09-29 DIAGNOSIS — E871 Hypo-osmolality and hyponatremia: Secondary | ICD-10-CM | POA: Diagnosis present

## 2020-09-29 DIAGNOSIS — Z7982 Long term (current) use of aspirin: Secondary | ICD-10-CM

## 2020-09-29 DIAGNOSIS — G3189 Other specified degenerative diseases of nervous system: Secondary | ICD-10-CM | POA: Diagnosis present

## 2020-09-29 DIAGNOSIS — I10 Essential (primary) hypertension: Secondary | ICD-10-CM | POA: Diagnosis not present

## 2020-09-29 DIAGNOSIS — Z20822 Contact with and (suspected) exposure to covid-19: Secondary | ICD-10-CM | POA: Diagnosis not present

## 2020-09-29 HISTORY — DX: Transient cerebral ischemic attack, unspecified: G45.9

## 2020-09-29 HISTORY — DX: Chronic obstructive pulmonary disease, unspecified: J44.9

## 2020-09-29 LAB — URINALYSIS, ROUTINE W REFLEX MICROSCOPIC
Bilirubin Urine: NEGATIVE
Glucose, UA: NEGATIVE mg/dL
Hgb urine dipstick: NEGATIVE
Ketones, ur: NEGATIVE mg/dL
Leukocytes,Ua: NEGATIVE
Nitrite: NEGATIVE
Protein, ur: NEGATIVE mg/dL
Specific Gravity, Urine: 1.01 (ref 1.005–1.030)
pH: 6 (ref 5.0–8.0)

## 2020-09-29 LAB — RAPID URINE DRUG SCREEN, HOSP PERFORMED
Amphetamines: NOT DETECTED
Barbiturates: NOT DETECTED
Benzodiazepines: NOT DETECTED
Cocaine: NOT DETECTED
Opiates: NOT DETECTED
Tetrahydrocannabinol: NOT DETECTED

## 2020-09-29 LAB — COMPREHENSIVE METABOLIC PANEL
ALT: 28 U/L (ref 0–44)
AST: 28 U/L (ref 15–41)
Albumin: 4.3 g/dL (ref 3.5–5.0)
Alkaline Phosphatase: 131 U/L — ABNORMAL HIGH (ref 38–126)
Anion gap: 11 (ref 5–15)
BUN: 13 mg/dL (ref 8–23)
CO2: 25 mmol/L (ref 22–32)
Calcium: 9.9 mg/dL (ref 8.9–10.3)
Chloride: 95 mmol/L — ABNORMAL LOW (ref 98–111)
Creatinine, Ser: 0.85 mg/dL (ref 0.44–1.00)
GFR, Estimated: >60 mL/min (ref >60)
Glucose, Bld: 95 mg/dL (ref 70–99)
Potassium: 3.9 mmol/L (ref 3.5–5.1)
Sodium: 131 mmol/L — ABNORMAL LOW (ref 135–145)
Total Bilirubin: 0.4 mg/dL (ref 0.3–1.2)
Total Protein: 8 g/dL (ref 6.5–8.1)

## 2020-09-29 LAB — RESP PANEL BY RT-PCR (FLU A&B, COVID) ARPGX2
Influenza A by PCR: NEGATIVE
Influenza B by PCR: NEGATIVE
SARS Coronavirus 2 by RT PCR: NEGATIVE

## 2020-09-29 LAB — CBC
HCT: 39.9 % (ref 36.0–46.0)
Hemoglobin: 13.5 g/dL (ref 12.0–15.0)
MCH: 31.6 pg (ref 26.0–34.0)
MCHC: 33.8 g/dL (ref 30.0–36.0)
MCV: 93.4 fL (ref 80.0–100.0)
Platelets: 373 10*3/uL (ref 150–400)
RBC: 4.27 MIL/uL (ref 3.87–5.11)
RDW: 13.2 % (ref 11.5–15.5)
WBC: 9.5 10*3/uL (ref 4.0–10.5)
nRBC: 0 % (ref 0.0–0.2)

## 2020-09-29 LAB — DIFFERENTIAL
Abs Immature Granulocytes: 0.04 10*3/uL (ref 0.00–0.07)
Basophils Absolute: 0.1 10*3/uL (ref 0.0–0.1)
Basophils Relative: 1 %
Eosinophils Absolute: 1 10*3/uL — ABNORMAL HIGH (ref 0.0–0.5)
Eosinophils Relative: 10 %
Immature Granulocytes: 0 %
Lymphocytes Relative: 20 %
Lymphs Abs: 1.9 10*3/uL (ref 0.7–4.0)
Monocytes Absolute: 1.3 10*3/uL — ABNORMAL HIGH (ref 0.1–1.0)
Monocytes Relative: 14 %
Neutro Abs: 5.2 10*3/uL (ref 1.7–7.7)
Neutrophils Relative %: 55 %

## 2020-09-29 LAB — ETHANOL: Alcohol, Ethyl (B): <10 mg/dL (ref <10)

## 2020-09-29 NOTE — ED Triage Notes (Signed)
Pt arrives with daughter who reports she called her mom to pick her up for family dinner states that her mother told her around lunch she was having trouble speaking so left lunch (in her communal dining area where she lives) and went back to her room. PT reports her symptoms have since gotten better. Daughter thinks she was having trouble "finding words".

## 2020-09-29 NOTE — ED Notes (Signed)
Attempted arterial stick for lab work without success. RN aware

## 2020-09-29 NOTE — ED Provider Notes (Signed)
[facility] EMERGENCY DEPARTMENT Provider Note   CSN: [id number] Arrival date & time: 09/29/20  1544  An emergency department physician performed an initial assessment on this suspected stroke patient at 1611.  History Chief Complaint  Patient presents with   Aphasia    Sheila Mccoy is a 86 y.o. female.  HPI 86 year old female presents with acute word finding difficulty.  She was normal when she went to lunch and at around 12:30 PM she started having difficulty getting out the right words.  Became frustrated to the point that she went back to her room.  Called her daughter around 35 and she brought her to the ER.  She seems to be significantly better than when this first started but still does not feel like she is back to normal and the daughter agrees.  Certain words seem to be difficult for her.  No headache, weakness or numbness in extremities, or any other symptoms.  Of note, the patient is on her third round of Cipro in about a month's worth of time for UTI.   Past Medical History:  Diagnosis Date   Anxiety    COPD (chronic obstructive pulmonary disease) (HCC)    Depression    Thyroid disease     Patient Active Problem List   Diagnosis Date Noted   TIA (transient ischemic attack) 09/29/2020   Anxiety 07/12/2020   UTI (urinary tract infection) 07/09/2020   Acute metabolic encephalopathy 07/09/2020   Sacral fracture (HCC) 07/09/2020   COPD (chronic obstructive pulmonary disease) (HCC) 07/09/2020   Adnexal cyst 07/09/2020    Past Surgical History:  Procedure Laterality Date   arm fracture surgery       OB History   No obstetric history on file.     Family History  Problem Relation Age of Onset   Lung cancer Mother     Social History   Tobacco Use   Smoking status: Never Smoker   Smokeless tobacco: Never Used  Vaping Use   Vaping Use: Never used  Substance Use Topics   Alcohol use: Never   Drug use: Never    Home  Medications Prior to Admission medications   Medication Sig Start Date End Date Taking? Authorizing Provider  albuterol (VENTOLIN HFA) 108 (90 Base) MCG/ACT inhaler Inhale 1-2 puffs into the lungs every 6 (six) hours as needed for wheezing or shortness of breath. 06/08/20   Dahlia Byes A, NP  aspirin (ASPIRIN 81) 81 MG chewable tablet Chew 81 mg by mouth daily.     [provider]  bisacodyl (DULCOLAX) 10 MG suppository Place 1 suppository (10 mg total) rectally daily as needed for moderate constipation. 07/12/20   Pokhrel, Rebekah Chesterfield, MD  budesonide-formoterol (SYMBICORT) 80-4.5 MCG/ACT inhaler Inhale 1 puff into the lungs 2 (two) times daily.    [provider]  buPROPion (WELLBUTRIN) 75 MG tablet Take 75 mg by mouth daily.  06/13/20   [provider]  diclofenac Sodium (VOLTAREN) 1 % GEL Apply 2 g topically 3 (three) times daily as needed (pain).    [provider]  dorzolamide-timolol (COSOPT) 22.3-6.8 MG/ML ophthalmic solution Place 1 drop into the left eye 2 (two) times daily.    [provider]  furosemide (LASIX) 20 MG tablet Take 1 tablet (20 mg total) by mouth daily as needed for fluid or edema. 07/16/20   Pokhrel, Rebekah Chesterfield, MD  gabapentin (NEURONTIN) 100 MG capsule Take 100 mg by mouth 3 (three) times daily. Patient not taking: Reported on 07/10/2020  [provider]  hydrOXYzine (VISTARIL) 25 MG capsule Take 25 mg by mouth 2 (two) times daily as needed (for shortness of breath).    [provider]  latanoprost (XALATAN) 0.005 % ophthalmic solution Place 1 drop into both eyes at bedtime.    [provider]  levothyroxine (SYNTHROID) 75 MCG tablet Take by mouth. 06/13/20   [provider]  mirtazapine (REMERON) 15 MG tablet Take 15 mg by mouth at bedtime.  06/13/20   [provider]  pantoprazole (PROTONIX) 40 MG tablet Take 1 tablet (40 mg total) by mouth daily. 07/12/20 07/12/21  Pokhrel, Rebekah Chesterfield, MD  polyethylene  glycol (MIRALAX / GLYCOLAX) 17 g packet Take 17 g by mouth daily as needed for moderate constipation. 07/12/20   Pokhrel, Rebekah Chesterfield, MD  potassium chloride SA (KLOR-CON) 20 MEQ tablet Take 1 tablet (20 mEq total) by mouth daily for 5 days. 07/12/20 07/17/20  Pokhrel, Rebekah Chesterfield, MD  risperiDONE (RISPERDAL) 0.5 MG tablet Take 0.5 mg by mouth at bedtime.  05/15/20   [provider]    Allergies    Macrobid [nitrofurantoin]  Review of Systems   Review of Systems  Neurological: Positive for speech difficulty. Negative for weakness, numbness and headaches.  All other systems reviewed and are negative.   Physical Exam Updated Vital Signs BP 139/60    Pulse 71    Temp 98 F (36.7 C) (Oral)    Resp (!) 22    Ht 5\' 3"  (1.6 m)    Wt 61.2 kg    SpO2 90%    BMI 23.91 kg/m   Physical Exam Vitals and nursing note reviewed.  Constitutional:      Appearance: She is well-developed and well-nourished.  HENT:     Head: Normocephalic and atraumatic.     Right Ear: External ear normal.     Left Ear: External ear normal.     Nose: Nose normal.  Eyes:     General:        Right eye: No discharge.        Left eye: No discharge.     Extraocular Movements: Extraocular movements intact.     Pupils: Pupils are equal, round, and reactive to light.  Cardiovascular:     Rate and Rhythm: Normal rate and regular rhythm.     Heart sounds: Normal heart sounds.  Pulmonary:     Effort: Pulmonary effort is normal.     Breath sounds: Normal breath sounds.  Abdominal:     Palpations: Abdomen is soft.     Tenderness: There is no abdominal tenderness.  Skin:    General: Skin is warm and dry.  Neurological:     Mental Status: She is alert.     Comments: CN 3-12 grossly intact. 5/5 strength in all 4 extremities. Grossly normal sensation. Normal finger to nose.   Psychiatric:        Mood and Affect: Mood is not anxious.     ED Results / Procedures / Treatments   Labs (all labs ordered are listed, but only  abnormal results are displayed) Labs Reviewed  DIFFERENTIAL - Abnormal; Notable for the following components:      Result Value   Monocytes Absolute 1.3 (*)    Eosinophils Absolute 1.0 (*)    All other components within normal limits  COMPREHENSIVE METABOLIC PANEL - Abnormal; Notable for the following components:   Sodium 131 (*)    Chloride 95 (*)    Alkaline Phosphatase 131 (*)  All other components within normal limits  RESP PANEL BY RT-PCR (FLU A&B, COVID) ARPGX2  ETHANOL  CBC  RAPID URINE DRUG SCREEN, HOSP PERFORMED  URINALYSIS, ROUTINE W REFLEX MICROSCOPIC  PROTIME-INR  APTT  CBG MONITORING, ED    EKG EKG Interpretation  Date/Time:  Friday September 29 2020 17:12:54 EST Ventricular Rate:  65 PR Interval:    QRS Duration: 135 QT Interval:  446 QTC Calculation: 464 R Axis:   69 Text Interpretation: Sinus rhythm IVCD, consider atypical RBBB Confirmed by Pricilla Loveless [phone]) on 09/29/2020 5:28:03 PM   Radiology CT HEAD CODE STROKE WO CONTRAST  Result Date: 09/29/2020 CLINICAL DATA:  Code stroke.  Aphasia. EXAM: CT HEAD WITHOUT CONTRAST TECHNIQUE: Contiguous axial images were obtained from the base of the skull through the vertex without intravenous contrast. COMPARISON:  07/09/2020 FINDINGS: Brain: No acute cortically based infarct, intracranial hemorrhage, mass, midline shift, or extra-axial fluid collection is identified. Patchy hypodensities are again seen in the cerebral white matter bilaterally and are nonspecific but compatible with moderate chronic small vessel ischemic disease, overall similar to the prior study though with an interval age indeterminate lacunar infarct not excluded in the left corona radiata. There is mild cerebral atrophy. Vascular: Calcified atherosclerosis at the skull base. No hyperdense vessel. Skull: No fracture or suspicious osseous lesion. Sinuses/Orbits: Visualized paranasal sinuses and mastoid air cells are clear. Bilateral cataract  extraction. Other: None. ASPECTS [facility] Stroke Program Early CT Score) - Ganglionic level infarction (caudate, lentiform nuclei, internal capsule, insula, M1-M3 cortex): 7 - Supraganglionic infarction (M4-M6 cortex): 3 Total score (0-10 with 10 being normal): 10 IMPRESSION: 1. No evidence of acute cortically based infarct or intracranial hemorrhage. 2. ASPECTS is 10. 3. Moderate chronic small vessel ischemic disease with question of an interval age indeterminate lacunar infarct in left corona radiata. These results were called by telephone at the time of interpretation on 09/29/2020 at 4:41 pm to Dr. Pricilla Loveless, who verbally acknowledged these results. Electronically Signed   By: Sebastian Ache M.D.   On: 09/29/2020 16:42    Procedures Procedures (including critical care time)  Medications Ordered in ED Medications - No data to display  ED Course  I have reviewed the triage vital signs and the nursing notes.  Pertinent labs & imaging results that were available during my care of the patient were reviewed by me and considered in my medical decision making (see chart for details).  Clinical Course as of 09/29/20 2223  Fri Sep 29, 2020  1610 On my exam, patient seems to be speaking okay but both daughter and the patient states she is not quite back to normal as far as speech is concerned.  Still seems to be missing some words.  Thus will call code stroke. [SG]    Clinical Course User Index [SG] Pricilla Loveless, MD   MDM Rules/Calculators/A&P                          Patient's symptoms have now seemed to resolve. Thus neuro recommends no acute intervention but needs admission for TIA workup. Discussed with Dr. Criselda Peaches who accepts for admission. Remains symptom free now. Final Clinical Impression(s) / ED Diagnoses Final diagnoses:  TIA (transient ischemic attack)    Rx / DC Orders ED Discharge Orders    None       Pricilla Loveless, MD 09/29/20 2224

## 2020-09-29 NOTE — ED Notes (Signed)
Arterial stick via RT not successful for PT/PTT, all other labs were drawn, now Tonie, RN in room attempting IV ultrasound

## 2020-09-29 NOTE — Care Plan (Signed)
Called by Dr. Criss Alvine at [facility] regarding this patient.    86 year old woman with PMH of thyroid disease, COPD, depression.  Lives at an ALF.  Noticed today that she had word finding difficulty and called her daughter who brought her to the ED.  She was noted to be improving at that time.  Neuro consult and Code Stroke called.  Teleneuro recommended TIA work up.  CT head showed no acute changes.  Symptoms have since resolved at this time and patient is back to baseline.  She will need admission for TIA/stroke work up at [facility].   Accepted for transfer to [facility] hospital for stroke/TIA work up.   Debe Coder, MD

## 2020-09-29 NOTE — Consult Note (Addendum)
[facility] Telemedicine Consult   Requesting Provider: Criss Alvine Consult Participants: Nurse, daughter Location of the provider: [facility] Location of the patient: [facility]  This consult was provided via telemedicine with 2-way video and audio communication. The patient/family was informed that care would be provided in this way and agreed to receive care in this manner.    Chief Complaint: Difficulty with speech  HPI: 86 year old female with a history of UTI, thyroid disease, COPD on ASA who reports that today noted difficulty with speech.  Episode lasted about one hour and resolved spontaneously.  Patient reports feeling at baseline.  Told her daughter later who encouraged patient to present for evaluation.  Initial NIHSS of 1.    LKW: 09/29/2020, 1230 tpa given?: No, Resolution of symptoms IR Thrombectomy? No, Resolution of symptoms Time of teleneurologist evaluation: 1644  Exam: Vitals:   09/29/20 1555  BP: (!) 141/83  Pulse: (!) 57  Resp: 14  Temp: 98 F (36.7 C)  SpO2: 98%    General: Awake and alert  1A: Level of Consciousness - 0 1B: Ask Month and Age - 0 1C: 'Blink Eyes' & 'Squeeze Hands' - 0 2: Test Horizontal Extraocular Movements - 0 3: Test Visual Fields - 0 4: Test Facial Palsy - 1 5A: Test Left Arm Motor Drift - 0 5B: Test Right Arm Motor Drift - 0 6A: Test Left Leg Motor Drift - 0 6B: Test Right Leg Motor Drift - 0 7: Test Limb Ataxia - 0 8: Test Sensation - 0 9: Test Language/Aphasia- 0 10: Test Dysarthria - 0 11: Test Extinction/Inattention - 0 NIHSS score: 1   Imaging Reviewed: Head CT without contrast personally reviewed and shows no acute changes.   Labs reviewed in epic and pertinent values follow: CBG pending   Assessment: 86 year old female with a history of UTI, thyroid disease, COPD on ASA who reports that today after having an hour episode of difficulty with speech.  Patient reports now feeling at baseline.  On ASA  daily.  Suspect TIA.  Recommendations: 1. HgbA1c, fasting lipid panel 2. MRI, MRA  of the brain without contrast 3. PT consult, OT consult, Speech consult 4. Echocardiogram 5. Carotid dopplers 6. Prophylactic therapy-Dual antiplatelet therapy with ASA 81mg  and Plavix 75mg  for three weeks with change to Plavix 75mg  daily alone as monotherapy after that time. 7. NPO until RN stroke swallow screen 8. Telemetry monitoring 9. Frequent neuro checks    This patient is receiving care for possible acute neurological changes. There was 32 minutes of care by this provider at the time of service, including time for direct evaluation via telemedicine, review of medical records, imaging studies and discussion of findings with providers, the patient and/or family.  , MD Neurology   If 7pm- 7am, please page neurology on call as listed in AMION.

## 2020-09-30 ENCOUNTER — Observation Stay (HOSPITAL_COMMUNITY): Payer: Medicare Other

## 2020-09-30 DIAGNOSIS — Z801 Family history of malignant neoplasm of trachea, bronchus and lung: Secondary | ICD-10-CM | POA: Diagnosis not present

## 2020-09-30 DIAGNOSIS — E785 Hyperlipidemia, unspecified: Secondary | ICD-10-CM | POA: Diagnosis not present

## 2020-09-30 DIAGNOSIS — Z7951 Long term (current) use of inhaled steroids: Secondary | ICD-10-CM | POA: Diagnosis not present

## 2020-09-30 DIAGNOSIS — F419 Anxiety disorder, unspecified: Secondary | ICD-10-CM | POA: Diagnosis not present

## 2020-09-30 DIAGNOSIS — F32A Depression, unspecified: Secondary | ICD-10-CM | POA: Diagnosis not present

## 2020-09-30 DIAGNOSIS — I351 Nonrheumatic aortic (valve) insufficiency: Secondary | ICD-10-CM

## 2020-09-30 DIAGNOSIS — E039 Hypothyroidism, unspecified: Secondary | ICD-10-CM

## 2020-09-30 DIAGNOSIS — E7800 Pure hypercholesterolemia, unspecified: Secondary | ICD-10-CM

## 2020-09-30 DIAGNOSIS — Z881 Allergy status to other antibiotic agents status: Secondary | ICD-10-CM | POA: Diagnosis not present

## 2020-09-30 DIAGNOSIS — R29701 NIHSS score 1: Secondary | ICD-10-CM | POA: Diagnosis not present

## 2020-09-30 DIAGNOSIS — Z885 Allergy status to narcotic agent status: Secondary | ICD-10-CM | POA: Diagnosis not present

## 2020-09-30 DIAGNOSIS — I63411 Cerebral infarction due to embolism of right middle cerebral artery: Secondary | ICD-10-CM | POA: Diagnosis not present

## 2020-09-30 DIAGNOSIS — Z66 Do not resuscitate: Secondary | ICD-10-CM | POA: Diagnosis not present

## 2020-09-30 DIAGNOSIS — G459 Transient cerebral ischemic attack, unspecified: Secondary | ICD-10-CM | POA: Diagnosis present

## 2020-09-30 DIAGNOSIS — Z20822 Contact with and (suspected) exposure to covid-19: Secondary | ICD-10-CM | POA: Diagnosis not present

## 2020-09-30 DIAGNOSIS — E871 Hypo-osmolality and hyponatremia: Secondary | ICD-10-CM | POA: Diagnosis not present

## 2020-09-30 DIAGNOSIS — Z7982 Long term (current) use of aspirin: Secondary | ICD-10-CM | POA: Diagnosis not present

## 2020-09-30 DIAGNOSIS — I340 Nonrheumatic mitral (valve) insufficiency: Secondary | ICD-10-CM | POA: Diagnosis not present

## 2020-09-30 DIAGNOSIS — G3189 Other specified degenerative diseases of nervous system: Secondary | ICD-10-CM | POA: Diagnosis not present

## 2020-09-30 DIAGNOSIS — Z79899 Other long term (current) drug therapy: Secondary | ICD-10-CM | POA: Diagnosis not present

## 2020-09-30 DIAGNOSIS — I10 Essential (primary) hypertension: Secondary | ICD-10-CM | POA: Diagnosis not present

## 2020-09-30 DIAGNOSIS — Z79890 Hormone replacement therapy (postmenopausal): Secondary | ICD-10-CM | POA: Diagnosis not present

## 2020-09-30 DIAGNOSIS — J449 Chronic obstructive pulmonary disease, unspecified: Secondary | ICD-10-CM | POA: Diagnosis not present

## 2020-09-30 DIAGNOSIS — R4701 Aphasia: Secondary | ICD-10-CM | POA: Diagnosis not present

## 2020-09-30 HISTORY — DX: Hypothyroidism, unspecified: E03.9

## 2020-09-30 LAB — LIPID PANEL
Cholesterol: 208 mg/dL — ABNORMAL HIGH (ref 0–200)
HDL: 58 mg/dL (ref >40)
LDL Cholesterol: 134 mg/dL — ABNORMAL HIGH (ref 0–99)
Total CHOL/HDL Ratio: 3.6 RATIO
Triglycerides: 81 mg/dL (ref <150)
VLDL: 16 mg/dL (ref 0–40)

## 2020-09-30 LAB — ECHOCARDIOGRAM COMPLETE
AR max vel: 2.1 cm2
AV Area VTI: 2.38 cm2
AV Area mean vel: 2.09 cm2
AV Mean grad: 2 mmHg
AV Peak grad: 4.5 mmHg
Ao pk vel: 1.06 m/s
Height: 63 in
S' Lateral: 2.7 cm
Single Plane A4C EF: 61.8 %
Weight: 2160 oz

## 2020-09-30 LAB — HEMOGLOBIN A1C
Hgb A1c MFr Bld: 5.2 % (ref 4.8–5.6)
Mean Plasma Glucose: 102.54 mg/dL

## 2020-09-30 MED ORDER — BUPROPION HCL 75 MG PO TABS
75.0000 mg | ORAL_TABLET | Freq: Every day | ORAL | Status: DC
  Administered 2020-09-30 – 2020-10-02 (×3): 75 mg via ORAL
  Filled 2020-09-30 (×3): qty 1

## 2020-09-30 MED ORDER — ALBUTEROL SULFATE HFA 108 (90 BASE) MCG/ACT IN AERS: 1.0000 | INHALATION_SPRAY | Freq: Four times a day (QID) | RESPIRATORY_TRACT | Status: DC | PRN

## 2020-09-30 MED ORDER — MOMETASONE FURO-FORMOTEROL FUM 100-5 MCG/ACT IN AERO
2.0000 | INHALATION_SPRAY | Freq: Two times a day (BID) | RESPIRATORY_TRACT | Status: DC
  Administered 2020-10-01 – 2020-10-02 (×3): 2 via RESPIRATORY_TRACT
  Filled 2020-09-30: qty 8.8

## 2020-09-30 MED ORDER — ENOXAPARIN SODIUM 40 MG/0.4ML ~~LOC~~ SOLN
40.0000 mg | SUBCUTANEOUS | Status: DC
  Administered 2020-09-30 – 2020-10-02 (×3): 40 mg via SUBCUTANEOUS
  Filled 2020-09-30 (×3): qty 0.4

## 2020-09-30 MED ORDER — RISPERIDONE 0.5 MG PO TABS
0.5000 mg | ORAL_TABLET | Freq: Every day | ORAL | Status: DC
  Administered 2020-09-30 – 2020-10-01 (×3): 0.5 mg via ORAL
  Filled 2020-09-30 (×4): qty 1

## 2020-09-30 MED ORDER — ASPIRIN EC 81 MG PO TBEC
81.0000 mg | DELAYED_RELEASE_TABLET | Freq: Every day | ORAL | Status: DC
  Administered 2020-09-30 – 2020-10-02 (×3): 81 mg via ORAL
  Filled 2020-09-30 (×3): qty 1

## 2020-09-30 MED ORDER — LEVOTHYROXINE SODIUM 75 MCG PO TABS
75.0000 ug | ORAL_TABLET | Freq: Every day | ORAL | Status: DC
  Administered 2020-09-30 – 2020-10-02 (×3): 75 ug via ORAL
  Filled 2020-09-30 (×3): qty 1

## 2020-09-30 MED ORDER — MIRTAZAPINE 15 MG PO TABS
15.0000 mg | ORAL_TABLET | Freq: Every day | ORAL | Status: DC
Start: 2020-09-30 — End: 2020-10-02
  Administered 2020-09-30 – 2020-10-01 (×3): 15 mg via ORAL
  Filled 2020-09-30 (×3): qty 1

## 2020-09-30 MED ORDER — STROKE: EARLY STAGES OF RECOVERY BOOK
Freq: Once | Status: AC
  Filled 2020-09-30: qty 1

## 2020-09-30 MED ORDER — ACETAMINOPHEN 650 MG RE SUPP: 650.0000 mg | RECTAL | Status: DC | PRN

## 2020-09-30 MED ORDER — ACETAMINOPHEN 160 MG/5ML PO SOLN: 650.0000 mg | ORAL | Status: DC | PRN

## 2020-09-30 MED ORDER — CLOPIDOGREL BISULFATE 75 MG PO TABS
75.0000 mg | ORAL_TABLET | Freq: Every day | ORAL | Status: DC
  Administered 2020-09-30 – 2020-10-02 (×3): 75 mg via ORAL
  Filled 2020-09-30 (×3): qty 1

## 2020-09-30 MED ORDER — POLYETHYLENE GLYCOL 3350 17 G PO PACK
17.0000 g | PACK | Freq: Every day | ORAL | Status: DC | PRN
  Administered 2020-09-30: 17 g via ORAL
  Filled 2020-09-30: qty 1

## 2020-09-30 MED ORDER — LATANOPROST 0.005 % OP SOLN
1.0000 [drp] | Freq: Every day | OPHTHALMIC | Status: DC
  Administered 2020-09-30 – 2020-10-01 (×3): 1 [drp] via OPHTHALMIC
  Filled 2020-09-30: qty 2.5

## 2020-09-30 MED ORDER — ATORVASTATIN CALCIUM 40 MG PO TABS
40.0000 mg | ORAL_TABLET | Freq: Every day | ORAL | Status: DC
  Administered 2020-09-30 – 2020-10-02 (×3): 40 mg via ORAL
  Filled 2020-09-30 (×3): qty 1

## 2020-09-30 MED ORDER — ACETAMINOPHEN 325 MG PO TABS
650.0000 mg | ORAL_TABLET | ORAL | Status: DC | PRN
  Administered 2020-10-01: 650 mg via ORAL
  Filled 2020-09-30: qty 2

## 2020-09-30 MED ORDER — DORZOLAMIDE HCL-TIMOLOL MAL 2-0.5 % OP SOLN
1.0000 [drp] | Freq: Two times a day (BID) | OPHTHALMIC | Status: DC
Start: 2020-09-30 — End: 2020-10-02
  Administered 2020-09-30 – 2020-10-02 (×5): 1 [drp] via OPHTHALMIC
  Filled 2020-09-30: qty 10

## 2020-09-30 NOTE — Progress Notes (Signed)
Carotid artery duplex has been completed. Preliminary results can be found in CV Proc through chart review.   09/30/20 11:25 AM Olen Cordial RVT

## 2020-09-30 NOTE — H&P (Signed)
History and Physical   Sheila Mccoy ZOX:[id number] DOB: [birth year] DOA: 09/29/2020  PCP: Buzzy Han, MD   Patient coming from: [facility]  Chief Complaint: Word finding difficulty  HPI: Sheila Mccoy is a 86 y.o. female with medical history significant of anxiety/depression, COPD, hypothyroidism who presents with word finding difficulty.  Patient lives at [place] .  She was normal when she went to lunch and around 12:30 PM she began finding word finding difficulties.  She states that she became frustrated and went back to her room.  She contacted her daughter around 2:30 PM and her daughter brought her to the ED at [facility] for further evaluation.  Her symptoms were improving in the ED.  She denies fever, cough, chills, chest pain, shortness of breath, constipation, diarrhea, nausea.  She denies any other symptoms.  ED Course: Vitals in ED significant for blood pressure in the 811B to 147W systolic.  Lab work-up showed BMP with sodium of 131.  LFTs with alk phos of 131.  CBC with normal limits.  PT PTT and INR pending.  Respiratory panel for flu and Covid negative.  Ethanol level negative, UDS negative, urinalysis normal.  CT head was negative for acute abnormality.  Teleneurology was consulted who recommended work-up for TIA.  Review of Systems: As per HPI otherwise all other systems reviewed and are negative.  Past Medical History:  Diagnosis Date  . Anxiety   . COPD (chronic obstructive pulmonary disease) ([place])   . Depression   . Thyroid disease     Past Surgical History:  Procedure Laterality Date  . arm fracture surgery      Social History  reports that she has never smoked. She has never used smokeless tobacco. She reports that she does not drink alcohol and does not use drugs.  Allergies  Allergen Reactions  . Macrobid [Nitrofurantoin] Other (See Comments)    tired    Family History  Problem Relation Age of Onset  . Lung cancer Mother    Reviewed on admission  Prior to Admission medications   Medication Sig Start Date End Date Taking? Authorizing Provider  albuterol (VENTOLIN HFA) 108 (90 Base) MCG/ACT inhaler Inhale 1-2 puffs into the lungs every 6 (six) hours as needed for wheezing or shortness of breath. 06/08/20   Loura Halt A, NP  aspirin (ASPIRIN 81) 81 MG chewable tablet Chew 81 mg by mouth daily.     [provider]  bisacodyl (DULCOLAX) 10 MG suppository Place 1 suppository (10 mg total) rectally daily as needed for moderate constipation. 07/12/20   Pokhrel, Corrie Mckusick, MD  budesonide-formoterol (SYMBICORT) 80-4.5 MCG/ACT inhaler Inhale 1 puff into the lungs 2 (two) times daily.    [provider]  buPROPion (WELLBUTRIN) 75 MG tablet Take 75 mg by mouth daily.  06/13/20   [provider]  diclofenac Sodium (VOLTAREN) 1 % GEL Apply 2 g topically 3 (three) times daily as needed (pain).    [provider]  dorzolamide-timolol (COSOPT) 22.3-6.8 MG/ML ophthalmic solution Place 1 drop into the left eye 2 (two) times daily.    [provider]  furosemide (LASIX) 20 MG tablet Take 1 tablet (20 mg total) by mouth daily as needed for fluid or edema. 07/16/20   Pokhrel, Corrie Mckusick, MD  gabapentin (NEURONTIN) 100 MG capsule Take 100 mg by mouth 3 (three) times daily. Patient not taking: Reported on 07/10/2020    [provider]  hydrOXYzine (VISTARIL) 25 MG capsule Take 25 mg by mouth  2 (two) times daily as needed (for shortness of breath).    [provider]  latanoprost (XALATAN) 0.005 % ophthalmic solution Place 1 drop into both eyes at bedtime.    [provider]  levothyroxine (SYNTHROID) 75 MCG tablet Take by mouth. 06/13/20   [provider]  mirtazapine (REMERON) 15 MG tablet Take 15 mg by mouth at bedtime.  06/13/20   [provider]  pantoprazole (PROTONIX) 40 MG tablet Take 1 tablet (40 mg total) by mouth daily. 07/12/20 07/12/21  Pokhrel, Corrie Mckusick, MD   polyethylene glycol (MIRALAX / GLYCOLAX) 17 g packet Take 17 g by mouth daily as needed for moderate constipation. 07/12/20   Pokhrel, Corrie Mckusick, MD  potassium chloride SA (KLOR-CON) 20 MEQ tablet Take 1 tablet (20 mEq total) by mouth daily for 5 days. 07/12/20 07/17/20  Pokhrel, Corrie Mckusick, MD  risperiDONE (RISPERDAL) 0.5 MG tablet Take 0.5 mg by mouth at bedtime.  05/15/20   [provider]    Physical Exam: Vitals:   09/29/20 2100 09/29/20 2115 09/29/20 2130 09/29/20 2309  BP: 130/68  139/60 (!) 155/91  Pulse: 69 70 71 70  Resp: 20 (!) 24 (!) 22 19  Temp:    (!) 97.5 F (36.4 C)  TempSrc:    Oral  SpO2: 98% 93% 90% 95%  Weight:      Height:       Physical Exam Constitutional:      General: She is not in acute distress.    Appearance: Normal appearance.     Comments: Elderly female  HENT:     Head: Normocephalic and atraumatic.     Mouth/Throat:     Mouth: Mucous membranes are moist.     Pharynx: Oropharynx is clear.  Eyes:     Extraocular Movements: Extraocular movements intact.     Pupils: Pupils are equal, round, and reactive to light.  Cardiovascular:     Rate and Rhythm: Normal rate and regular rhythm.     Pulses: Normal pulses.     Heart sounds: Normal heart sounds.  Pulmonary:     Effort: Pulmonary effort is normal. No respiratory distress.     Breath sounds: Normal breath sounds.  Abdominal:     General: Bowel sounds are normal. There is no distension.     Palpations: Abdomen is soft.     Tenderness: There is no abdominal tenderness.  Musculoskeletal:        General: No swelling or deformity.  Skin:    General: Skin is warm and dry.  Neurological:     General: No focal deficit present.     Mental Status: Mental status is at baseline.     Comments: Mental Status: Patient is awake, alert No signs of aphasia or neglect Cranial Nerves: III,IV, VI: EOMI without ptosis or diploplia.  V: Facial sensation is symmetric tolight touch. VII: Facial movement is  symmetric.  VIII: hearing is intact to voice X: Uvula elevates symmetrically XI: Shoulder shrug is symmetric. XII: tongue is midline without atrophy or fasciculations.  Motor: Good effort thorughout, at Least 5/5 bilateral UE, 5/5 bilateral lower extremitiy Sensory: Sensation is grossly intact bilateral UEs & LEs    Labs on Admission: I have personally reviewed following labs and imaging studies  CBC: Recent Labs  Lab 09/29/20 1644  WBC 9.5  NEUTROABS 5.2  HGB 13.5  HCT 39.9  MCV 93.4  PLT 509    Basic Metabolic Panel: Recent Labs  Lab 09/29/20 1644  NA 131*  K 3.9  CL 95*  CO2 25  GLUCOSE 95  BUN 13  CREATININE 0.85  CALCIUM 9.9    GFR: Estimated Creatinine Clearance: 38.6 mL/min (by C-G formula based on SCr of 0.85 mg/dL).  Liver Function Tests: Recent Labs  Lab 09/29/20 1644  AST 28  ALT 28  ALKPHOS 131*  BILITOT 0.4  PROT 8.0  ALBUMIN 4.3    Urine analysis:    Component Value Date/Time   COLORURINE YELLOW 09/29/2020 1750   APPEARANCEUR CLEAR 09/29/2020 1750   LABSPEC 1.010 09/29/2020 1750   PHURINE 6.0 09/29/2020 1750   GLUCOSEU NEGATIVE 09/29/2020 1750   HGBUR NEGATIVE 09/29/2020 1750   BILIRUBINUR NEGATIVE 09/29/2020 1750   KETONESUR NEGATIVE 09/29/2020 1750   PROTEINUR NEGATIVE 09/29/2020 1750   NITRITE NEGATIVE 09/29/2020 1750   LEUKOCYTESUR NEGATIVE 09/29/2020 1750    Radiological Exams on Admission: CT HEAD CODE STROKE WO CONTRAST  Result Date: 09/29/2020 CLINICAL DATA:  Code stroke.  Aphasia. EXAM: CT HEAD WITHOUT CONTRAST TECHNIQUE: Contiguous axial images were obtained from the base of the skull through the vertex without intravenous contrast. COMPARISON:  07/09/2020 FINDINGS: Brain: No acute cortically based infarct, intracranial hemorrhage, mass, midline shift, or extra-axial fluid collection is identified. Patchy hypodensities are again seen in the cerebral white matter bilaterally and are nonspecific but compatible with  moderate chronic small vessel ischemic disease, overall similar to the prior study though with an interval age indeterminate lacunar infarct not excluded in the left corona radiata. There is mild cerebral atrophy. Vascular: Calcified atherosclerosis at the skull base. No hyperdense vessel. Skull: No fracture or suspicious osseous lesion. Sinuses/Orbits: Visualized paranasal sinuses and mastoid air cells are clear. Bilateral cataract extraction. Other: None. ASPECTS [facility] Stroke Program Early CT Score) - Ganglionic level infarction (caudate, lentiform nuclei, internal capsule, insula, M1-M3 cortex): 7 - Supraganglionic infarction (M4-M6 cortex): 3 Total score (0-10 with 10 being normal): 10 IMPRESSION: 1. No evidence of acute cortically based infarct or intracranial hemorrhage. 2. ASPECTS is 10. 3. Moderate chronic small vessel ischemic disease with question of an interval age indeterminate lacunar infarct in left corona radiata. These results were called by telephone at the time of interpretation on 09/29/2020 at 4:41 pm to Dr. Sherwood Gambler, who verbally acknowledged these results. Electronically Signed   By: Logan Bores M.D.   On: 09/29/2020 16:42    EKG: Independently reviewed.  Sinus rhythm at 65 bpm.  Right bundle branch block.  Assessment/Plan Principal Problem:   TIA (transient ischemic attack) Active Problems:   COPD (chronic obstructive pulmonary disease) (HCC)   Anxiety   Hypothyroidism   Depression  TIA > Patient with word finding difficulty earlier today, resolved in ED > CT head without acute abnormality - Neurology consulted at [facility], will be following - Allow for permissive HTN systolic < 573 and diastolic < 220 - ASA 81 mg daily  - Plavix 75 mg daily - Atorvastatin  - MR brain - Echocardiogram  - Carotid doppler  - A1C  - Lipid panel  - Tele monitoring  - SLP eval - PT/OT  COPD - Home Symbicort replaced with Dulera - As needed  albuterol  Hypothyroidism -Continue home Synthroid  Anxiety/depression - Continue home continue home bupropion, mirtazapine and risperidone - Patient unsure if she would need medication for MRI  DVT prophylaxis: Lovenox Code Status:   DNR  Family Communication:  None at the time of admission Disposition Plan:   Patient is from:  [place]  Anticipated DC to:  [facility]  Anticipated DC date:  12/25 or 12/26  Anticipated DC barriers: None  Consults called:  Neurology, Dr. Doy Mince, consulted by EDP at [facility]  Admission status:  Observation, telemetry   Severity of Illness: The appropriate patient status for this patient is OBSERVATION. Observation status is judged to be reasonable and necessary in order to provide the required intensity of service to ensure the patient's safety. The patient's presenting symptoms, physical exam findings, and initial radiographic and laboratory data in the context of their medical condition is felt to place them at decreased risk for further clinical deterioration. Furthermore, it is anticipated that the patient will be medically stable for discharge from the hospital within 2 midnights of admission. The following factors support the patient status of observation.   " The patient's presenting symptoms include word finding difficulty. " The physical exam findings include stable findings. " The initial radiographic and laboratory data are stable.   Marcelyn Bruins MD [facility]  How to contact the [facility] Attending or Consulting provider [place] or covering provider during after hours [place], for this patient?   1. Check the care team in [facility] and look for a) attending/consulting TRH provider listed and b) the [facility] team listed 2. Log into [email or web address] and use [place]'s universal password to access. If you do not have the password, please contact the hospital operator. 3. Locate the [facility] provider you are  looking for under [facility] and page to a number that you can be directly reached. 4. If you still have difficulty reaching the provider, please page the [facility] (Director on Call) for the Hospitalists listed on amion for assistance.  09/30/2020, 1:25 AM

## 2020-09-30 NOTE — Evaluation (Signed)
Occupational Therapy Evaluation Patient Details Name: Sheila Mccoy MRN: [id number] DOB: 20-May-1931 Today's Date: 09/30/2020    History of Present Illness 86 year old female with a history of UTI, thyroid disease, COPD on ASA who reports that today noted difficulty with speech.   Clinical Impression   [name] is an 86 year old woman admitted to hospital with complaints of word finding issues. She is normally independent with BADLs and able to ambulate to the dining room with rolator 3 x a day. Imaging has been negative for acute abnormalities. On evaluation patient demonstrates normal ROM, strength and coordination of upper extremities except for chronic shoulder deficit. Patient reports word finding difficulty has resolved and speech and orientation normal. Patient demonstrates ability to transfer to side of bed with supervision and stand with min guard. With standing patient reporting urinary urgency and unable to make it to bathroom resulting in incontinent episode. Patient ambulated to bathroom with rolator and transferred to toilet with min guard. Patient able to perform bathing and toileting task on toilet with set up. Patient did need assistance to don gripper socks - she reports her socks are smaller and she typically pulls her leg up onto the bed. Patient was doing well and completely normal until halfway back to the bed and patient reporting weakness and then exhibited difficulty following verbal commands nearing the bed. She required mod assist to pivot to sit at edge of bed, to scoot up to the head of the bed and return to supine. Patient's o2 sat dropped to 86% and took several minutes for BP to be obtained was 116/86. Patient appears to be near her baseline without focal neurological deficits with a possible orthostatic episode after ambulation. Will recommend HH OT at ALF and see patient will in hospital to improve independence with ADLs and activity tolerance in order to return  to ALF at discharge.      Follow Up Recommendations  Home health OT    Equipment Recommendations  None recommended by OT    Recommendations for Other Services       Precautions / Restrictions        Mobility Bed Mobility Overal bed mobility: Needs Assistance Bed Mobility: Supine to Sit;Sit to Supine     Supine to sit: Supervision;HOB elevated Sit to supine: Mod assist   General bed mobility comments: Performed supine to sit with supervision. Able to stand and ambulate to bathroom with 4 wheel walker with min guard. Able to stand from toilet using grab bar and ambulate back towards bed. reports weakness after returning to bed and then required assistance to scoot up to head of bed - not following commands to stand and mod assist to return to supine. Patient reports feeling weak. It took several minutes to find machine to take BP and BP 116/86 in supine. o2 sat 86% on RA and [place] replaced.    Transfers Overall transfer level: Needs assistance Equipment used: 4-wheeled walker Transfers: Sit to/from [employer] Sit to Stand: Min guard         General transfer comment: Performed supine to sit with supervision. Able to stand and ambulate to bathroom with 4 wheel walker with min guard. Able to stand from toilet using grab bar and ambulate back towards bed. Halfway back to bed patient began to report feeling weak and gait began to slow - on turn to sit down down patient began to state "oh no" and not follow verbal commands. required mod assist to pivot  to sit at end of bed. Then needed assistance to make her way up the bed - not following commands well and requring assistance to scoot.    Balance Overall balance assessment: Needs assistance Sitting-balance support: No upper extremity supported;Feet supported Sitting balance-Leahy Scale: Good     Standing balance support: Bilateral upper extremity supported;During functional activity Standing balance-Leahy Scale:  Fair                             ADL either performed or assessed with clinical judgement   ADL Overall ADL's : Needs assistance/impaired Eating/Feeding: Independent   Grooming: Independent   Upper Body Bathing: Set up   Lower Body Bathing: Set up Lower Body Bathing Details (indicate cue type and reason): able to wash lower body with setup including lower legs and feet. Sitting on toilet. Upper Body Dressing : Modified independent   Lower Body Dressing: Moderate assistance Lower Body Dressing Details (indicate cue type and reason): assistance to don socks seated on toilet. Reports she typically dons shorter socks seated at side of bed. Toilet Transfer: Market researcher and Hygiene: Min guard;Sit to/from stand               Vision Patient Visual Report: No change from baseline Vision Assessment?: No apparent visual deficits     Perception     Praxis      Pertinent Vitals/Pain Pain Assessment: No/denies pain     Hand Dominance Right   Extremity/Trunk Assessment Upper Extremity Assessment Upper Extremity Assessment: RUE deficits/detail;LUE deficits/detail RUE Deficits / Details: Decreased shoulder ROM at baseline, 4/5 elbow strength, 5/5 wrist, 4/5 grip RUE Sensation: WNL RUE Coordination: WNL LUE Deficits / Details: WFL ROM and strength LUE Sensation: WNL LUE Coordination: WNL   Lower Extremity Assessment Lower Extremity Assessment: Defer to PT evaluation   Cervical / Trunk Assessment Cervical / Trunk Assessment: Kyphotic   Communication Communication Communication: No difficulties   Cognition Arousal/Alertness: Awake/alert Behavior During Therapy: WFL for tasks assessed/performed Overall Cognitive Status: Within Functional Limits for tasks assessed                                     General Comments       Exercises     Shoulder Instructions      Home Living Family/patient  expects to be discharged to:: Assisted living                             Home Equipment: Walker - 4 wheels          Prior Functioning/Environment Level of Independence: Independent with assistive device(s)        Comments: Ambulates with rollator. Can ambulate to dining room. Reports independence with ADLs. Does have assistance with showering and medication management.        OT Problem List: Decreased activity tolerance;Impaired balance (sitting and/or standing);Decreased safety awareness;Cardiopulmonary status limiting activity      OT Treatment/Interventions: Self-care/ADL training;DME and/or AE instruction;Patient/family education;Balance training;Therapeutic activities    OT Goals(Current goals can be found in the care plan section) Acute Rehab OT Goals Patient Stated Goal: Did not state OT Goal Formulation: With patient Time For Goal Achievement: 10/13/20 Potential to Achieve Goals: Good  OT Frequency: Min 2X/week   Barriers to D/C:  Co-evaluation              AM-PAC OT "6 Clicks" Daily Activity     Outcome Measure Help from another person eating meals?: None Help from another person taking care of personal grooming?: None Help from another person toileting, which includes using toliet, bedpan, or urinal?: A Little Help from another person bathing (including washing, rinsing, drying)?: A Little Help from another person to put on and taking off regular upper body clothing?: A Little Help from another person to put on and taking off regular lower body clothing?: A Lot 6 Click Score: 19   End of Session Equipment Utilized During Treatment: Rolling walker;Oxygen Nurse Communication: Mobility status (Notified of weaness at end of session, BP...and requests recommendation of PT prior to discharge.)  Activity Tolerance: Patient tolerated treatment well Patient left: in bed;with call bell/phone within reach;with bed alarm set  OT Visit  Diagnosis: Unsteadiness on feet (R26.81);Muscle weakness (generalized) (M62.81)                Time: 9030-0923 OT Time Calculation (min): 27 min Charges:  OT General Charges $OT Visit: 1 Visit OT Evaluation $OT Eval Moderate Complexity: 1 Mod OT Treatments $Self Care/Home Management : 8-22 mins  Eero Dini, OTR/L Acute Care Rehab Services  Office [phone] Pager: [phone]   Kelli Churn 09/30/2020, 9:32 AM

## 2020-09-30 NOTE — Progress Notes (Signed)
MD was able to speak to family.

## 2020-09-30 NOTE — Progress Notes (Signed)
Just got off phone with daughters who stated they were talking to their mother and she became very confused about what state she was in and was speaking repetitively about a church.  They said this is not normal and she is normally very quick witted and has no confusion.  I went in and spoke patient and she can answer all orientation questions and no SS of stroke but says she was confused for a minute.   Daughters are very concerned.  Notified MD.

## 2020-09-30 NOTE — Progress Notes (Signed)
  Echocardiogram 2D Echocardiogram has been performed.  Sheila Mccoy F 09/30/2020, 11:06 AM

## 2020-09-30 NOTE — Care Management Obs Status (Signed)
MEDICARE OBSERVATION STATUS NOTIFICATION   Patient Details  Name: Sheila Mccoy MRN: [id number] Date of Birth: [birth year]   Medicare Observation Status Notification Given:  Yes  Given via phone to daughter as patient was away from room. Verbalized understanding consented to sign on her behalf.   Lockie Pares, RN 09/30/2020, 2:09 PM

## 2020-09-30 NOTE — Progress Notes (Signed)
86 year old lady with history of COPD hypothyroidism and UTI admitted with word finding difficulty.  This has been resolved.  She was admitted early today. So far work-up shows CT head without acute findings. She has been on aspirin Plavix and statin. MRI of the brain-45 mm diffusion abnormality involving the cortical and subcortical aspect of the right parietal lobe consistent with small acute to early subacute ischemic infarcts.  No hemorrhage noted. Echocardiogram-fraction 60 to 65%.  No wall motion abnormalities.  Grade 2 diastolic dysfunction. A1c 5.2 Lipid panel LDL is 134 She passed bedside swallow evaluation Carotid ultrasound with 1 to 39% stenosis. I will consult neurology.  PT is recommending SNF.

## 2020-09-30 NOTE — Evaluation (Signed)
Physical Therapy Evaluation Patient Details Name: Sheila Mccoy MRN: [id number] DOB: Jul 28, 1931 Today's Date: 09/30/2020   History of Present Illness  86 year old female with a history of UTI, thyroid disease, COPD on ASA who reports that today noted difficulty with speech.  Clinical Impression  Pt presents to PT with deficits in functional mobility, gait, balance, cognition, strength, memory, and activity tolerance. Pt reports she did not sleep at all last night and was admitted to her room at 4AM. Pt declines ambulation due to fatigue. Pt with significantly slowed processing and continues to demonstrate word finding and memory deficits. Pt is generally weak and is not safe to manage ADLs and household mobility at this time. Pt will benefit from acute PT POC to improve activity tolerance and reduce falls risk. PT recommends SNF placement due to limited activity tolerance and poor cognition.    Follow Up Recommendations SNF (may progress quickly)    Equipment Recommendations  None recommended by PT    Recommendations for Other Services       Precautions / Restrictions Precautions Precautions: Fall Restrictions Weight Bearing Restrictions: No      Mobility  Bed Mobility Overal bed mobility: Needs Assistance Bed Mobility: Supine to Sit;Sit to Supine     Supine to sit: Supervision Sit to supine: Supervision   General bed mobility comments: Performed supine to sit with supervision. Able to stand and ambulate to bathroom with 4 wheel walker with min guard. Able to stand from toilet using grab bar and ambulate back towards bed. reports weakness after returning to bed and then required assistance to scoot up to head of bed - not following commands to stand and mod assist to return to supine. Patient reports feeling weak. It took several minutes to find machine to take BP and BP 116/86 in supine. o2 sat 86% on RA and [place] replaced.    Transfers Overall transfer level: Needs  assistance Equipment used: 4-wheeled walker Transfers: Sit to/from Stand Sit to Stand: Min guard         General transfer comment: Performed supine to sit with supervision. Able to stand and ambulate to bathroom with 4 wheel walker with min guard. Able to stand from toilet using grab bar and ambulate back towards bed. Halfway back to bed patient began to report feeling weak and gait began to slow - on turn to sit down down patient began to state "oh no" and not follow verbal commands. required mod assist to pivot to sit at end of bed. Then needed assistance to make her way up the bed - not following commands well and requring assistance to scoot.  Ambulation/Gait Ambulation/Gait assistance:  (pt declines due to fatigue)              Stairs            Wheelchair Mobility    Modified Rankin (Stroke Patients Only) Modified Rankin (Stroke Patients Only) Pre-Morbid Rankin Score: Moderate disability Modified Rankin: Moderately severe disability     Balance Overall balance assessment: Needs assistance Sitting-balance support: No upper extremity supported;Feet supported Sitting balance-Leahy Scale: Good     Standing balance support: Bilateral upper extremity supported;During functional activity Standing balance-Leahy Scale: Fair Standing balance comment: reliant on UE support of Rollator                             Pertinent Vitals/Pain Pain Assessment: No/denies pain    Home Living Family/patient expects to  be discharged to:: Other (Comment) (independent living facility)               Home Equipment: Walker - 4 wheels      Prior Function Level of Independence: Independent with assistive device(s)         Comments: Ambulates with rollator. Can ambulate to dining room. Reports independence with ADLs. Does have assistance with showering and medication management.     Hand Dominance   Dominant Hand: Right    Extremity/Trunk Assessment    Upper Extremity Assessment Upper Extremity Assessment: Defer to OT evaluation RUE Deficits / Details: Decreased shoulder ROM at baseline, 4/5 elbow strength, 5/5 wrist, 4/5 grip RUE Sensation: WNL RUE Coordination: WNL LUE Deficits / Details: WFL ROM and strength LUE Sensation: WNL LUE Coordination: WNL    Lower Extremity Assessment Lower Extremity Assessment: Generalized weakness (grossly 4/5)    Cervical / Trunk Assessment Cervical / Trunk Assessment: Kyphotic  Communication   Communication: HOH  Cognition Arousal/Alertness: Awake/alert Behavior During Therapy: WFL for tasks assessed/performed Overall Cognitive Status: No family/caregiver present to determine baseline cognitive functioning                                 General Comments: pt with slowed processing, requires multiple cues for dressing and continues to demonstrate word finding difficulties when speaking to daughter on phone. Pt also with impaired short term memory, unable to recall what tests were done last evening.      General Comments General comments (skin integrity, edema, etc.): pt on 2L [place] during session, SpO2 stable    Exercises     Assessment/Plan    PT Assessment Patient needs continued PT services  PT Problem List Decreased strength;Decreased activity tolerance;Decreased balance;Decreased mobility;Decreased cognition;Decreased knowledge of use of DME;Decreased safety awareness;Decreased knowledge of precautions       PT Treatment Interventions DME instruction;Gait training;Functional mobility training;Therapeutic activities;Balance training;Therapeutic exercise;Neuromuscular re-education;Patient/family education;Cognitive remediation    PT Goals (Current goals can be found in the Care Plan section)  Acute Rehab PT Goals Patient Stated Goal: Did not state PT Goal Formulation: With patient Time For Goal Achievement: 10/14/20 Potential to Achieve Goals: Good    Frequency Min  3X/week   Barriers to discharge Decreased caregiver support      Co-evaluation               AM-PAC PT "6 Clicks" Mobility  Outcome Measure Help needed turning from your back to your side while in a flat bed without using bedrails?: A Little Help needed moving from lying on your back to sitting on the side of a flat bed without using bedrails?: A Little Help needed moving to and from a bed to a chair (including a wheelchair)?: A Little Help needed standing up from a chair using your arms (e.g., wheelchair or bedside chair)?: A Little Help needed to walk in hospital room?: A Lot Help needed climbing 3-5 steps with a railing? : A Lot 6 Click Score: 16    End of Session Equipment Utilized During Treatment: Oxygen Activity Tolerance: Patient limited by fatigue Patient left: in bed;with call bell/phone within reach;with bed alarm set Nurse Communication: Mobility status PT Visit Diagnosis: Unsteadiness on feet (R26.81);Other abnormalities of gait and mobility (R26.89);Muscle weakness (generalized) (M62.81)    Time: 4332-9518 PT Time Calculation (min) (ACUTE ONLY): 20 min   Charges:   PT Evaluation $PT Eval Low Complexity: 1 Low  Arlyss Gandy, PT, DPT Acute Rehabilitation Pager: [phone]   Arlyss Gandy 09/30/2020, 9:41 AM

## 2020-10-01 DIAGNOSIS — E7800 Pure hypercholesterolemia, unspecified: Secondary | ICD-10-CM | POA: Diagnosis not present

## 2020-10-01 DIAGNOSIS — G459 Transient cerebral ischemic attack, unspecified: Secondary | ICD-10-CM | POA: Diagnosis not present

## 2020-10-01 LAB — CBC
HCT: 40.8 % (ref 36.0–46.0)
Hemoglobin: 13.4 g/dL (ref 12.0–15.0)
MCH: 30.6 pg (ref 26.0–34.0)
MCHC: 32.8 g/dL (ref 30.0–36.0)
MCV: 93.2 fL (ref 80.0–100.0)
Platelets: 416 10*3/uL — ABNORMAL HIGH (ref 150–400)
RBC: 4.38 MIL/uL (ref 3.87–5.11)
RDW: 13.1 % (ref 11.5–15.5)
WBC: 10.9 10*3/uL — ABNORMAL HIGH (ref 4.0–10.5)
nRBC: 0 % (ref 0.0–0.2)

## 2020-10-01 LAB — FOLATE: Folate: 14.9 ng/mL (ref >5.9)

## 2020-10-01 LAB — VITAMIN B12: Vitamin B-12: 1143 pg/mL — ABNORMAL HIGH (ref 180–914)

## 2020-10-01 LAB — TSH: TSH: 1.184 u[IU]/mL (ref 0.350–4.500)

## 2020-10-01 LAB — SEDIMENTATION RATE: Sed Rate: 24 mm/hr — ABNORMAL HIGH (ref 0–22)

## 2020-10-01 MED ORDER — ALUM & MAG HYDROXIDE-SIMETH 200-200-20 MG/5ML PO SUSP
30.0000 mL | ORAL | Status: DC | PRN
  Administered 2020-10-01 – 2020-10-02 (×2): 30 mL via ORAL
  Filled 2020-10-01 (×2): qty 30

## 2020-10-01 NOTE — Consult Note (Signed)
Referring Physician: Georgette Shell, MD    Reason for Consult: Stroke  HPI: Tanishi Nault is an 86 y.o. female with a history of COPD, Anxiety, Depression and hypothyroidism seen as a [employer] by Alexis Goodell, MD after the pt presented to the [facility] in [facility] with speech difficulties that resolved spontaneously after approximately one hour. The patient lives at an assisted living facility. She was fine when she went to lunch on the 24th but at about 12:30 PM she developed problems with her speech in the form of word finding difficulties. She called her daughter around 2:30 who brought her to the [facility] in [facility]. The pt was essentially back to baseline by the time she saw Dr Doy Mince. A CT of the head showed no acute findings and it was felt that the pt had suffered a TIA. She was transferred to [facility] for further workup and evaluation. Home medications included aspirin 81 mg daily.  The patient is left-handed and states clearly that she had expressive language difficulties.  MRI scan shows tiny punctate right parietal white matter juxtacortical infarct which cannot explain her expressive aphasia.  She denies any prior history of atrial fibrillation, palpitations, syncope or loss of consciousness.  Date last known well: 09/29/2020 Time last known well: 12:30 tPA Given: no - deficits resoled   Past Medical History Past Medical History:  Diagnosis Date  . Anxiety   . COPD (chronic obstructive pulmonary disease) ([place])   . Depression   . Thyroid disease     Surgical History Past Surgical History:  Procedure Laterality Date  . arm fracture surgery      Family History  Family History  Problem Relation Age of Onset  . Lung cancer Mother     Social History:   reports that she has never smoked. She has never used smokeless tobacco. She reports that she does not drink alcohol and does not use drugs.  Allergies:  Allergies   Allergen Reactions  . Hydromorphone     Very Disoriented & Confused  . Macrobid [Nitrofurantoin] Other (See Comments)    tired    Home Medications:  Medications Prior to Admission  Medication Sig Dispense Refill  . albuterol (VENTOLIN HFA) 108 (90 Base) MCG/ACT inhaler Inhale 1-2 puffs into the lungs every 6 (six) hours as needed for wheezing or shortness of breath. 1 each 0  . aspirin (ASPIRIN 81) 81 MG chewable tablet Chew 81 mg by mouth daily.     . budesonide-formoterol (SYMBICORT) 80-4.5 MCG/ACT inhaler Inhale 1 puff into the lungs 2 (two) times daily.    Marland Kitchen buPROPion (WELLBUTRIN) 75 MG tablet Take 75 mg by mouth daily.     . ciprofloxacin (CIPRO) 250 MG tablet Take 250 mg by mouth 2 (two) times daily.    . diclofenac Sodium (VOLTAREN) 1 % GEL Apply 2 g topically 3 (three) times daily as needed (pain).    . dorzolamide-timolol (COSOPT) 22.3-6.8 MG/ML ophthalmic solution Place 1 drop into the left eye 2 (two) times daily.    . furosemide (LASIX) 20 MG tablet Take 1 tablet (20 mg total) by mouth daily as needed for fluid or edema.    Marland Kitchen latanoprost (XALATAN) 0.005 % ophthalmic solution Place 1 drop into both eyes at bedtime.    Marland Kitchen levothyroxine (SYNTHROID) 75 MCG tablet Take 75 mcg by mouth daily before breakfast.    . mirtazapine (REMERON) 15 MG tablet Take 15 mg by mouth at bedtime.     Marland Kitchen  pantoprazole (PROTONIX) 40 MG tablet Take 1 tablet (40 mg total) by mouth daily. (Patient taking differently: Take 40 mg by mouth daily as needed (indigestion).) 30 tablet 0  . risperiDONE (RISPERDAL) 0.5 MG tablet Take 0.5 mg by mouth at bedtime.        ROS:  History obtained from the patient   General ROS: negative for - chills, fatigue, fever, night sweats, weight gain or weight loss Psychological ROS: negative for - behavioral disorder, hallucinations, memory difficulties, mood swings or suicidal ideation Ophthalmic ROS: negative for - blurry vision, double vision, eye pain or loss of  vision ENT ROS: negative for - epistaxis, nasal discharge, oral lesions, sore throat, tinnitus or vertigo Allergy and Immunology ROS: negative for - hives or itchy/watery eyes Hematological and Lymphatic ROS: negative for - bleeding problems, bruising or swollen lymph nodes Endocrine ROS: negative for - galactorrhea, hair pattern changes, polydipsia/polyuria or temperature intolerance Respiratory ROS: negative for - cough, hemoptysis, shortness of breath or wheezing Cardiovascular ROS: negative for - chest pain, dyspnea on exertion, edema or irregular heartbeat Gastrointestinal ROS: negative for - abdominal pain, diarrhea, hematemesis, nausea/vomiting or stool incontinence Genito-Urinary ROS: negative for - dysuria, hematuria, incontinence or urinary frequency/urgency Musculoskeletal ROS: negative for - joint swelling or muscular weakness Neurological ROS: as noted in HPI Dermatological ROS: negative for rash and skin lesion changes   Physical Examination:  Vitals:   09/30/20 0531 09/30/20 1410 09/30/20 2137 10/01/20 0500  BP: 127/74 (!) 147/72 139/87 129/68  Pulse: 72 68 69 61  Resp: 18 16    Temp: 97.8 F (36.6 C) 97.9 F (36.6 C) 98 F (36.7 C) 98 F (36.7 C)  TempSrc: Oral  Oral Oral  SpO2: 96% 96% 94% 99%  Weight:      Height:       Pleasant elderly African-American lady not in distress. . Afebrile. Head is nontraumatic. Neck is supple without bruit.    Cardiac exam no murmur or gallop. Lungs are clear to auscultation. Distal pulses are well felt.  Neurological Exam ;  Awake  Alert oriented x 3. Normal speech and language.eye movements full without nystagmus.fundi were not visualized. Vision acuity and fields appear normal. Hearing is normal. Palatal movements are normal. Face symmetric. Tongue midline. Normal strength, tone, reflexes and coordination. Normal sensation. Gait deferred.   LABORATORY STUDIES:  Basic Metabolic Panel: Recent Labs  Lab 09/29/20 1644  NA 131*   K 3.9  CL 95*  CO2 25  GLUCOSE 95  BUN 13  CREATININE 0.85  CALCIUM 9.9    Liver Function Tests: Recent Labs  Lab 09/29/20 1644  AST 28  ALT 28  ALKPHOS 131*  BILITOT 0.4  PROT 8.0  ALBUMIN 4.3   No results for input(s): LIPASE, AMYLASE in the last 168 hours. No results for input(s): AMMONIA in the last 168 hours.  CBC: Recent Labs  Lab 09/29/20 1644  WBC 9.5  NEUTROABS 5.2  HGB 13.5  HCT 39.9  MCV 93.4  PLT 373    Cardiac Enzymes: No results for input(s): CKTOTAL, CKMB, CKMBINDEX, TROPONINI in the last 168 hours.  BNP: Invalid input(s): POCBNP  CBG: No results for input(s): GLUCAP in the last 168 hours.  Microbiology:   Coagulation Studies: No results for input(s): LABPROT, INR in the last 72 hours.  Urinalysis:  Recent Labs  Lab 09/29/20 1750  COLORURINE YELLOW  LABSPEC 1.010  PHURINE 6.0  GLUCOSEU NEGATIVE  HGBUR NEGATIVE  BILIRUBINUR NEGATIVE  KETONESUR NEGATIVE  PROTEINUR NEGATIVE  NITRITE NEGATIVE  LEUKOCYTESUR NEGATIVE    Lipid Panel:     Component Value Date/Time   CHOL 208 (H) 09/30/2020 0232   TRIG 81 09/30/2020 0232   HDL 58 09/30/2020 0232   CHOLHDL 3.6 09/30/2020 0232   VLDL 16 09/30/2020 0232   LDLCALC 134 (H) 09/30/2020 0232    HgbA1C:  Lab Results  Component Value Date   HGBA1C 5.2 09/30/2020    Urine Drug Screen:      Component Value Date/Time   LABOPIA NONE DETECTED 09/29/2020 1750   COCAINSCRNUR NONE DETECTED 09/29/2020 1750   LABBENZ NONE DETECTED 09/29/2020 1750   AMPHETMU NONE DETECTED 09/29/2020 1750   THCU NONE DETECTED 09/29/2020 1750   LABBARB NONE DETECTED 09/29/2020 1750     Alcohol Level:  Recent Labs  Lab 09/29/20 [place] <10    IMAGING:   MR BRAIN WO CONTRAST 09/30/2020 IMPRESSION:  1. Two separate 4-5 mm foci of diffusion abnormality involving the cortical and subcortical aspect of the right parietal lobe, consistent with small acute to early subacute ischemic infarcts. No  associated hemorrhage or mass effect.  2. No other acute intracranial abnormality.  3. Age-related cerebral atrophy with moderate chronic small vessel ischemic disease.   CT HEAD CODE STROKE WO CONTRAST 09/29/2020 IMPRESSION:  1. No evidence of acute cortically based infarct or intracranial hemorrhage.  2. ASPECTS is 10.  3. Moderate chronic small vessel ischemic disease with question of an interval age indeterminate lacunar infarct in left corona radiata.  ECHOCARDIOGRAM COMPLETE 09/30/2020 IMPRESSIONS   1. Left ventricular ejection fraction, by estimation, is 60 to 65%. The left ventricle has normal function. The left ventricle has no regional wall motion abnormalities. There is moderate concentric left ventricular hypertrophy. Left ventricular diastolic parameters are consistent with Grade II diastolic dysfunction (pseudonormalization). Elevated left atrial pressure.   2. Right ventricular systolic function is normal. The right ventricular size is normal.   3. The mitral valve is normal in structure. Mild to moderate mitral valve regurgitation. No evidence of mitral stenosis. Moderate mitral annular calcification.   4. The aortic valve is normal in structure. There is mild calcification of the aortic valve. There is moderate thickening of the aortic valve. Aortic valve regurgitation is mild. Mild to moderate aortic valve sclerosis/calcification is present, without any evidence of aortic stenosis.   5. The inferior vena cava is normal in size with greater than 50% respiratory variability, suggesting right atrial pressure of 3 mmHg.  Conclusion(s)/Recommendation(s): No intracardiac source of embolism detected on this transthoracic study. A transesophageal echocardiogram is recommended to exclude cardiac source of embolism if clinically indicated.   VAS US CAROTID (at [facility] and [facility] only) 09/30/2020 Summary:  Right Carotid: Velocities in the right ICA are consistent with a 1-39% stenosis.  Left  Carotid: Velocities in the left ICA are consistent with a 1-39% stenosis.  Vertebrals: Bilateral vertebral arteries demonstrate antegrade flow.  Final     ECG - SR rate 65 BPM. (See cardiology reading for complete details)  Assessment:  Ms. Demeka Sutter is a 86 y.o. female with history of COPD, Anxiety, Depression and hypothyroidism seen as a [employer] by Alexis Goodell, MD after the pt presented to the [facility] in [facility] with speech difficulties (word finding problems) that resolved spontaneously after approximately one hour. She did not receive IV t-PA due to resolution of deficits.  Stroke: Two separate 4-5 mm foci of diffusion abnormality involving the cortical and subcortical aspect of the  right parietal lobe - embolic - source unknown which cannot explain the patient's symptom of expressive aphasia which is probably a TIA involving left hemisphere.  Etiology indeterminate but strong suspicion for paroxysmal A. fib given age   Resultant - resolution of deficits.  Code Stroke CT Head - No evidence of acute cortically based infarct or intracranial hemorrhage. ASPECTS is 10. Moderate chronic small vessel ischemic disease with question of an interval age indeterminate lacunar infarct in left corona radiata  CT head - not ordered  MRI head - Two separate 4-5 mm foci of diffusion abnormality involving the cortical and subcortical aspect of the right parietal lobe, consistent with small acute to early subacute ischemic infarcts.  MRA head - not ordered  CTA H&N - not ordered  CT Perfusion - not ordered  Carotid Doppler - unremarkable  2D Echo - EF 60 - 65%. No cardiac source of emboli identified.   Sars Corona Virus 2 - negative  LDL - 134  HgbA1c - 5.2  UDS - negative VTE prophylaxis - Lovenox 40 mg daily  Diet  Diet Order            Diet Heart Room service appropriate? No; Fluid consistency: Thin  Diet effective now                  aspirin 81 mg daily prior to admission, now on aspirin 81 mg daily and clopidogrel 75 mg daily into 3 weeks followed by Plavix alone  Patient counseled to be compliant with her antithrombotic medications  Ongoing aggressive stroke risk factor management  Therapy recommendations:  pending  Disposition:  Pending  Hypertension  Home BP meds: none   Current BP meds: none   Stable . Permissive hypertension (OK if < 220/120) but gradually normalize in 5-7 days  . Long-term BP goal normotensive  Hyperlipidemia  Home Lipid lowering medication: none   LDL 134, goal < 70  Current lipid lowering medication: Lipitor 40 mg daily  Continue statin at discharge  Other Stroke Risk Factors  Advanced age  Other Active Problems, Findings and Recommendations  Code status - DNR  Hyponatremia - 131  Elevated Alk Phos - 131 She presented with transient episode of expressive aphasia and MRI scan shows right parietal cortical and subcortical punctate infarcts which cannot explain symptoms.  Recommend continue cardiac monitoring and loop recorder at time of discharge.  Aspirin and Plavix for 3 weeks followed by Plavix alone.  Check transcranial Doppler study, vitamin B12, TSH and RPR for reversible causes of memory loss.  No family available at the bedside for discussion.  Discussed with Dr. Jacki Cones.  Greater than 50% time during this 35-minute visit was spent in counseling and coordination of care about her TIA and sent in cortical block and strong suspicion for A. fib and for evaluation and treatment. Antony Contras, MD

## 2020-10-01 NOTE — Progress Notes (Signed)
PROGRESS NOTE    Sheila Mccoy  GNO:[id number] DOB: 1931/01/16 DOA: 09/29/2020 PCP: Buzzy Han, MD    Brief Narrative: Sheila Mccoy is a 86 y.o. female with medical history significant of anxiety/depression, COPD, hypothyroidism who presents with word finding difficulty.  Patient lives at [place] .  She was normal when she went to lunch and around 12:30 PM she began finding word finding difficulties.  She states that she became frustrated and went back to her room.  She contacted her daughter around 2:30 PM and her daughter brought her to the ED at [facility] for further evaluation.  Her symptoms were improving in the ED.  She denies fever, cough, chills, chest pain, shortness of breath, constipation, diarrhea, nausea.  She denies any other symptoms.  ED Course: Vitals in ED significant for blood pressure in the 169I to 503U systolic.  Lab work-up showed BMP with sodium of 131.  LFTs with alk phos of 131.  CBC with normal limits.  PT PTT and INR pending.  Respiratory panel for flu and Covid negative.  Ethanol level negative, UDS negative, urinalysis normal.  CT head was negative for acute abnormality.  Teleneurology was consulted who recommended work-up for TIA.  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  She still does have occasional word finding difficulty. MRI brain showed no associated hemorrhage or mass-effect.  4 to 5 mm foci of diffusion abnormality involving the cortical and subcortical aspect of the right parietal lobe consistent with small acute or early subacute ischemic infarcts.  Age-related cerebral atrophy with moderate chronic small vessel ischemic changes. Echocardiogram-fraction 60 to 65%.  No wall motion  abnormalities.  Grade 2 diastolic dysfunction. A1c 5.2 Lipid panel LDL is 134 She passed bedside swallow evaluation and is tolerating diet Carotid ultrasound with 1 to 39% stenosis. She was on aspirin prior to admission to the hospital.  She has been started on aspirin Plavix and statin. Patient comes from [place] where physical therapy can be given. Await further neuro recommendations.  #2 history of COPD stable on Dulera and as needed albuterol  #3 hypothyroidism continue Synthroid  #4 history of anxiety and depression continue home medications including mirtazapine Risperdal and bupropion.  Estimated body mass index is 23.91 kg/m as calculated from the following:   Height as of this encounter: 5' 3"  (1.6 m).   Weight as of this encounter: 61.2 kg.  DVT prophylaxis: Lovenox Code Status: DNR  family Communication: Discussed with daughters 09/30/2020  disposition Plan:  Status is: Inpatient  Dispo: The patient is from: SNF              Anticipated d/c is to: SNF              Anticipated d/c date is: 1 day              Patient currently is not medically stable to d/c.    Consultants:   Neurology  Procedures: MRI of the brain CT head echocardiogram Antimicrobials: None  Subjective: Patient sitting up in chair by the bedside awake and alert answers all my questions appropriately and follows commands.  Objective: Vitals:   09/30/20 0531 09/30/20 1410 09/30/20 2137 10/01/20 0500  BP: 127/74 (!) 147/72 139/87 129/68  Pulse: 72  68 69 61  Resp: 18 16    Temp: 97.8 F (36.6 C) 97.9 F (36.6 C) 98 F (36.7 C) 98 F (36.7 C)  TempSrc: Oral  Oral Oral  SpO2: 96% 96% 94% 99%  Weight:      Height:        Intake/Output Summary (Last 24 hours) at 10/01/2020 0932 Last data filed at 10/01/2020 0500 Gross per 24 hour  Intake 960 ml  Output 900 ml  Net 60 ml   Filed Weights   09/29/20 1552  Weight: 61.2 kg    Examination:  General exam: Appears calm and  comfortable  Respiratory system: Clear to auscultation. Respiratory effort normal. Cardiovascular system: S1 & S2 heard, RRR. No JVD, murmurs, rubs, gallops or clicks. No pedal edema. Gastrointestinal system: Abdomen is nondistended, soft and nontender. No organomegaly or masses felt. Normal bowel sounds heard. Central nervous system: Alert and oriented.  Moves all extremities  extremities: Symmetric 5 x 5 power. Skin: No rashes, lesions or ulcers Psychiatry: Judgement and insight appear normal. Mood & affect appropriate.     Data Reviewed: I have personally reviewed following labs and imaging studies  CBC: Recent Labs  Lab 09/29/20 1644  WBC 9.5  NEUTROABS 5.2  HGB 13.5  HCT 39.9  MCV 93.4  PLT 161   Basic Metabolic Panel: Recent Labs  Lab 09/29/20 1644  NA 131*  K 3.9  CL 95*  CO2 25  GLUCOSE 95  BUN 13  CREATININE 0.85  CALCIUM 9.9   GFR: Estimated Creatinine Clearance: 38.6 mL/min (by C-G formula based on SCr of 0.85 mg/dL). Liver Function Tests: Recent Labs  Lab 09/29/20 1644  AST 28  ALT 28  ALKPHOS 131*  BILITOT 0.4  PROT 8.0  ALBUMIN 4.3   No results for input(s): LIPASE, AMYLASE in the last 168 hours. No results for input(s): AMMONIA in the last 168 hours. Coagulation Profile: No results for input(s): INR, PROTIME in the last 168 hours. Cardiac Enzymes: No results for input(s): CKTOTAL, CKMB, CKMBINDEX, TROPONINI in the last 168 hours. BNP (last 3 results) No results for input(s): PROBNP in the last 8760 hours. HbA1C: Recent Labs    09/30/20 0232  HGBA1C 5.2   CBG: No results for input(s): GLUCAP in the last 168 hours. Lipid Profile: Recent Labs    09/30/20 0232  CHOL 208*  HDL 58  LDLCALC 134*  TRIG 81  CHOLHDL 3.6   Thyroid Function Tests: No results for input(s): TSH, T4TOTAL, FREET4, T3FREE, THYROIDAB in the last 72 hours. Anemia Panel: No results for input(s): VITAMINB12, FOLATE, FERRITIN, TIBC, IRON, RETICCTPCT in the last  72 hours. Sepsis Labs: No results for input(s): PROCALCITON, LATICACIDVEN in the last 168 hours.  Recent Results (from the past 240 hour(s))  Resp Panel by RT-PCR (Flu A&B, Covid) Nasopharyngeal Swab     Status: None   Collection Time: 09/29/20  5:35 PM   Specimen: Nasopharyngeal Swab; Nasopharyngeal(NP) swabs in vial transport medium  Result Value Ref Range Status   SARS Coronavirus 2 by RT PCR NEGATIVE NEGATIVE Final    Comment: (NOTE) SARS-CoV-2 target nucleic acids are NOT DETECTED.  The SARS-CoV-2 RNA is generally detectable in upper respiratory specimens during the acute phase of infection. The lowest concentration of SARS-CoV-2 viral copies this assay can detect is 138 copies/mL. A negative result does not preclude SARS-Cov-2 infection and should not be used as the sole basis for treatment or other patient management decisions. A negative result may occur with  improper specimen collection/handling, submission of specimen other than nasopharyngeal swab, presence of viral mutation(s) within the areas targeted by this assay, and inadequate number of viral copies(<138 copies/mL). A negative result must be combined with clinical observations, patient history, and epidemiological information. The expected result is Negative.  Fact Sheet for Patients:  EntrepreneurPulse.com.au  Fact Sheet for Healthcare Providers:  IncredibleEmployment.be  This test is no t yet approved or cleared by the [place] FDA and  has been authorized for detection and/or diagnosis of SARS-CoV-2 by FDA under an Emergency Use Authorization (EUA). This EUA will remain  in effect (meaning this test can be used) for the duration of the COVID-19 declaration under Section 564(b)(1) of the Act, 21 U.S.C.section 360bbb-3(b)(1), unless the authorization is terminated  or revoked sooner.       Influenza A by PCR NEGATIVE NEGATIVE Final   Influenza B by PCR NEGATIVE  NEGATIVE Final    Comment: (NOTE) The Xpert Xpress SARS-CoV-2/FLU/RSV plus assay is intended as an aid in the diagnosis of influenza from Nasopharyngeal swab specimens and should not be used as a sole basis for treatment. Nasal washings and aspirates are unacceptable for Xpert Xpress SARS-CoV-2/FLU/RSV testing.  Fact Sheet for Patients: EntrepreneurPulse.com.au  Fact Sheet for Healthcare Providers: IncredibleEmployment.be  This test is not yet approved or cleared by the [place] FDA and has been authorized for detection and/or diagnosis of SARS-CoV-2 by FDA under an Emergency Use Authorization (EUA). This EUA will remain in effect (meaning this test can be used) for the duration of the COVID-19 declaration under Section 564(b)(1) of the Act, 21 U.S.C. section 360bbb-3(b)(1), unless the authorization is terminated or revoked.  Performed at [facility], [address] [place], [place] [address]          Radiology Studies: MR BRAIN WO CONTRAST  Result Date: 09/30/2020 CLINICAL DATA:  Initial evaluation for acute neuro deficit. EXAM: MRI HEAD WITHOUT CONTRAST TECHNIQUE: Multiplanar, multiecho pulse sequences of the brain and surrounding structures were obtained without intravenous contrast. COMPARISON:  Prior head CT from 09/29/2020 FINDINGS: Brain: Generalized age-related cerebral atrophy. Patchy and confluent T2/FLAIR hyperintensity within the periventricular deep white matter both cerebral hemispheres most consistent with chronic small vessel ischemic disease, moderate in nature. Mild patchy involvement of the pons noted. 4 mm focus of restricted diffusion seen involving the cortical/subcortical aspect of the right parietal lobe (series 5, image 75). Additional adjacent 4-5 mm focus of mild diffusion abnormality noted at the subcortical right parietal lobe as well (series 5, image 76), consistent with early subacute ischemic  change. No associated hemorrhage or mass effect. No other foci of susceptibility artifact to suggest acute or subacute ischemia. Gray-white matter differentiation otherwise maintained. No encephalomalacia to suggest chronic cortical infarction. No foci of susceptibility artifact to suggest acute or chronic intracranial hemorrhage. No mass lesion, midline shift or mass effect. No hydrocephalus or extra-axial fluid collection. Pituitary gland suprasellar region within normal limits. Midline structures intact. Vascular: Major intracranial vascular flow voids are maintained. Skull and upper cervical spine: Craniocervical junction within normal limits. Bone marrow signal intensity normal. No scalp soft tissue abnormality. Sinuses/Orbits: Patient status post bilateral ocular lens replacement. Globes and orbital soft tissues demonstrate no acute finding. Paranasal sinuses are largely clear. No significant mastoid effusion. Other: None. IMPRESSION: 1. Two separate 4-5 mm foci of diffusion abnormality involving the cortical and subcortical aspect of the right parietal lobe, consistent with small acute to early subacute ischemic infarcts. No associated hemorrhage or mass  effect. 2. No other acute intracranial abnormality. 3. Age-related cerebral atrophy with moderate chronic small vessel ischemic disease. Electronically Signed   By: Jeannine Boga M.D.   On: 09/30/2020 02:23   ECHOCARDIOGRAM COMPLETE  Result Date: 09/30/2020    ECHOCARDIOGRAM REPORT   Patient Name:   Sheila Mccoy Date of Exam: 09/30/2020 Medical Rec #:  [id number]     Height:       63.0 in Accession #:    [id number]    Weight:       135.0 lb Date of Birth:  08-12-1931      BSA:          1.636 m Patient Age:    77 years      BP:           126/74 mmHg Patient Gender: F             HR:           67 bpm. Exam Location:  Inpatient Procedure: 2D Echo, Cardiac Doppler and Color Doppler Indications:    Stroke  History:        Patient has prior history of  Echocardiogram examinations, most                 recent 05/16/2020. Signs/Symptoms:Altered Mental Status.  Sonographer:    Merrie Roof RDCS Referring Phys: [id number] [place]  1. Left ventricular ejection fraction, by estimation, is 60 to 65%. The left ventricle has normal function. The left ventricle has no regional wall motion abnormalities. There is moderate concentric left ventricular hypertrophy. Left ventricular diastolic parameters are consistent with Grade II diastolic dysfunction (pseudonormalization). Elevated left atrial pressure.  2. Right ventricular systolic function is normal. The right ventricular size is normal.  3. The mitral valve is normal in structure. Mild to moderate mitral valve regurgitation. No evidence of mitral stenosis. Moderate mitral annular calcification.  4. The aortic valve is normal in structure. There is mild calcification of the aortic valve. There is moderate thickening of the aortic valve. Aortic valve regurgitation is mild. Mild to moderate aortic valve sclerosis/calcification is present, without any evidence of aortic stenosis.  5. The inferior vena cava is normal in size with greater than 50% respiratory variability, suggesting right atrial pressure of 3 mmHg. Conclusion(s)/Recommendation(s): No intracardiac source of embolism detected on this transthoracic study. A transesophageal echocardiogram is recommended to exclude cardiac source of embolism if clinically indicated. FINDINGS  Left Ventricle: Left ventricular ejection fraction, by estimation, is 60 to 65%. The left ventricle has normal function. The left ventricle has no regional wall motion abnormalities. The left ventricular internal cavity size was normal in size. There is  moderate concentric left ventricular hypertrophy. Left ventricular diastolic parameters are consistent with Grade II diastolic dysfunction (pseudonormalization). Elevated left atrial pressure. Right Ventricle: The right  ventricular size is normal. No increase in right ventricular wall thickness. Right ventricular systolic function is normal. Left Atrium: Left atrial size was normal in size. Right Atrium: Right atrial size was normal in size. Pericardium: There is no evidence of pericardial effusion. Mitral Valve: The mitral valve is normal in structure. There is moderate thickening of the mitral valve leaflet(s). There is moderate calcification of the mitral valve leaflet(s). Moderate mitral annular calcification. Mild to moderate mitral valve regurgitation. No evidence of mitral valve stenosis. Tricuspid Valve: The tricuspid valve is normal in structure. Tricuspid valve regurgitation is not demonstrated. No evidence of tricuspid stenosis. Aortic Valve: The aortic valve is normal  in structure. There is mild calcification of the aortic valve. There is moderate thickening of the aortic valve. Aortic valve regurgitation is mild. Mild to moderate aortic valve sclerosis/calcification is present, without any evidence of aortic stenosis. Aortic valve mean gradient measures 2.0 mmHg. Aortic valve peak gradient measures 4.5 mmHg. Aortic valve area, by VTI measures 2.38 cm. Pulmonic Valve: The pulmonic valve was normal in structure. Pulmonic valve regurgitation is mild. No evidence of pulmonic stenosis. Aorta: The aortic root is normal in size and structure. Venous: The inferior vena cava is normal in size with greater than 50% respiratory variability, suggesting right atrial pressure of 3 mmHg. IAS/Shunts: No atrial level shunt detected by color flow Doppler.  LEFT VENTRICLE PLAX 2D LVIDd:         3.70 cm LVIDs:         2.70 cm LV PW:         1.20 cm LV IVS:        1.30 cm LVOT diam:     1.70 cm LV SV:         47 LV SV Index:   29 LVOT Area:     2.27 cm  LV Volumes (MOD) LV vol d, MOD A4C: 44.5 ml LV vol s, MOD A4C: 17.0 ml LV SV MOD A4C:     44.5 ml LEFT ATRIUM           Index       RIGHT ATRIUM           Index LA diam:      2.80 cm 1.71  cm/m  RA Area:     13.00 cm LA Vol (A2C): 47.1 ml 28.79 ml/m RA Volume:   27.40 ml  16.75 ml/m  AORTIC VALVE AV Area (Vmax):    2.10 cm AV Area (Vmean):   2.09 cm AV Area (VTI):     2.38 cm AV Vmax:           106.00 cm/s AV Vmean:          68.800 cm/s AV VTI:            0.199 m AV Peak Grad:      4.5 mmHg AV Mean Grad:      2.0 mmHg LVOT Vmax:         97.90 cm/s LVOT Vmean:        63.400 cm/s LVOT VTI:          0.209 m LVOT/AV VTI ratio: 1.05  AORTA Ao Root diam: 3.00 cm  SHUNTS Systemic VTI:  0.21 m Systemic Diam: 1.70 cm Ena Dawley MD Electronically signed by Ena Dawley MD Signature Date/Time: 09/30/2020/12:32:35 PM    Final    CT HEAD CODE STROKE WO CONTRAST  Result Date: 09/29/2020 CLINICAL DATA:  Code stroke.  Aphasia. EXAM: CT HEAD WITHOUT CONTRAST TECHNIQUE: Contiguous axial images were obtained from the base of the skull through the vertex without intravenous contrast. COMPARISON:  07/09/2020 FINDINGS: Brain: No acute cortically based infarct, intracranial hemorrhage, mass, midline shift, or extra-axial fluid collection is identified. Patchy hypodensities are again seen in the cerebral white matter bilaterally and are nonspecific but compatible with moderate chronic small vessel ischemic disease, overall similar to the prior study though with an interval age indeterminate lacunar infarct not excluded in the left corona radiata. There is mild cerebral atrophy. Vascular: Calcified atherosclerosis at the skull base. No hyperdense vessel. Skull: No fracture or suspicious osseous lesion. Sinuses/Orbits: Visualized paranasal sinuses and mastoid  air cells are clear. Bilateral cataract extraction. Other: None. ASPECTS [facility] Stroke Program Early CT Score) - Ganglionic level infarction (caudate, lentiform nuclei, internal capsule, insula, M1-M3 cortex): 7 - Supraganglionic infarction (M4-M6 cortex): 3 Total score (0-10 with 10 being normal): 10 IMPRESSION: 1. No evidence of acute cortically  based infarct or intracranial hemorrhage. 2. ASPECTS is 10. 3. Moderate chronic small vessel ischemic disease with question of an interval age indeterminate lacunar infarct in left corona radiata. These results were called by telephone at the time of interpretation on 09/29/2020 at 4:41 pm to Dr. Sherwood Gambler, who verbally acknowledged these results. Electronically Signed   By: Logan Bores M.D.   On: 09/29/2020 16:42   VAS US CAROTID (at [facility] and [facility] only)  Result Date: 09/30/2020 Carotid Arterial Duplex Study Indications:       TIA. Risk Factors:      None. Comparison Study:  No prior studies. Performing Technologist: Oliver Hum RVT  Examination Guidelines: A complete evaluation includes B-mode imaging, spectral Doppler, color Doppler, and power Doppler as needed of all accessible portions of each vessel. Bilateral testing is considered an integral part of a complete examination. Limited examinations for reoccurring indications may be performed as noted.  Right Carotid Findings: +----------+--------+-------+--------+--------------------------------+--------+           PSV cm/sEDV    StenosisPlaque Description              Comments                   cm/s                                                    +----------+--------+-------+--------+--------------------------------+--------+ CCA Prox  76      12             smooth and heterogenous                  +----------+--------+-------+--------+--------------------------------+--------+ CCA Distal64      15             smooth and heterogenous                  +----------+--------+-------+--------+--------------------------------+--------+ ICA Prox  58      18             smooth, heterogenous and        tortuous                                  calcific                                 +----------+--------+-------+--------+--------------------------------+--------+ ICA Distal62      17                                              tortuous +----------+--------+-------+--------+--------------------------------+--------+ ECA       67      10                                                      +----------+--------+-------+--------+--------------------------------+--------+ +----------+--------+-------+--------+-------------------+  PSV cm/sEDV cmsDescribeArm Pressure (mmHG) +----------+--------+-------+--------+-------------------+ [id number]                                         +----------+--------+-------+--------+-------------------+ +---------+--------+--+--------+--+---------+ VertebralPSV cm/s49EDV cm/s11Antegrade +---------+--------+--+--------+--+---------+  Left Carotid Findings: +----------+--------+--------+--------+-----------------------+--------+           PSV cm/sEDV cm/sStenosisPlaque Description     Comments +----------+--------+--------+--------+-----------------------+--------+ CCA Prox  76      16              smooth and homogeneous tortuous +----------+--------+--------+--------+-----------------------+--------+ CCA Distal57      11              smooth and heterogenous         +----------+--------+--------+--------+-----------------------+--------+ ICA Prox  147     32              smooth and heterogenous         +----------+--------+--------+--------+-----------------------+--------+ ICA Distal67      12                                     tortuous +----------+--------+--------+--------+-----------------------+--------+ ECA       82      8                                               +----------+--------+--------+--------+-----------------------+--------+ +----------+--------+--------+--------+-------------------+           PSV cm/sEDV cm/sDescribeArm Pressure (mmHG) +----------+--------+--------+--------+-------------------+ [id number]                                           +----------+--------+--------+--------+-------------------+ +---------+--------+--+--------+--+---------+ VertebralPSV cm/s47EDV cm/s10Antegrade +---------+--------+--+--------+--+---------+   Summary: Right Carotid: Velocities in the right ICA are consistent with a 1-39% stenosis. Left Carotid: Velocities in the left ICA are consistent with a 1-39% stenosis. Vertebrals: Bilateral vertebral arteries demonstrate antegrade flow. *See table(s) above for measurements and observations.  Electronically signed by Antony Contras MD on 09/30/2020 at 2:39:56 PM.    Final         Scheduled Meds: . aspirin EC  81 mg Oral Daily  . atorvastatin  40 mg Oral Daily  . buPROPion  75 mg Oral Daily  . clopidogrel  75 mg Oral Daily  . dorzolamide-timolol  1 drop Left Eye BID  . enoxaparin (LOVENOX) injection  40 mg Subcutaneous Q24H  . latanoprost  1 drop Both Eyes QHS  . levothyroxine  75 mcg Oral Q breakfast  . mirtazapine  15 mg Oral QHS  . mometasone-formoterol  2 puff Inhalation BID  . risperiDONE  0.5 mg Oral QHS   Continuous Infusions:   LOS: 1 day     Georgette Shell, MD 10/01/2020, 9:32 AM

## 2020-10-02 ENCOUNTER — Other Ambulatory Visit: Payer: Self-pay | Admitting: Student

## 2020-10-02 ENCOUNTER — Inpatient Hospital Stay (HOSPITAL_COMMUNITY): Payer: Medicare Other

## 2020-10-02 ENCOUNTER — Encounter (HOSPITAL_COMMUNITY): Payer: Self-pay | Admitting: Internal Medicine

## 2020-10-02 DIAGNOSIS — I63411 Cerebral infarction due to embolism of right middle cerebral artery: Secondary | ICD-10-CM

## 2020-10-02 DIAGNOSIS — G459 Transient cerebral ischemic attack, unspecified: Secondary | ICD-10-CM | POA: Diagnosis not present

## 2020-10-02 DIAGNOSIS — E7800 Pure hypercholesterolemia, unspecified: Secondary | ICD-10-CM | POA: Diagnosis not present

## 2020-10-02 DIAGNOSIS — I639 Cerebral infarction, unspecified: Secondary | ICD-10-CM

## 2020-10-02 LAB — BASIC METABOLIC PANEL
Anion gap: 11 (ref 5–15)
BUN: 24 mg/dL — ABNORMAL HIGH (ref 8–23)
CO2: 26 mmol/L (ref 22–32)
Calcium: 9.9 mg/dL (ref 8.9–10.3)
Chloride: 97 mmol/L — ABNORMAL LOW (ref 98–111)
Creatinine, Ser: 1.53 mg/dL — ABNORMAL HIGH (ref 0.44–1.00)
GFR, Estimated: 33 mL/min — ABNORMAL LOW (ref >60)
Glucose, Bld: 102 mg/dL — ABNORMAL HIGH (ref 70–99)
Potassium: 4.8 mmol/L (ref 3.5–5.1)
Sodium: 134 mmol/L — ABNORMAL LOW (ref 135–145)

## 2020-10-02 LAB — CBC
HCT: 40.9 % (ref 36.0–46.0)
Hemoglobin: 13.9 g/dL (ref 12.0–15.0)
MCH: 31.1 pg (ref 26.0–34.0)
MCHC: 34 g/dL (ref 30.0–36.0)
MCV: 91.5 fL (ref 80.0–100.0)
Platelets: 395 10*3/uL (ref 150–400)
RBC: 4.47 MIL/uL (ref 3.87–5.11)
RDW: 13 % (ref 11.5–15.5)
WBC: 9.3 10*3/uL (ref 4.0–10.5)
nRBC: 0 % (ref 0.0–0.2)

## 2020-10-02 LAB — RPR: RPR Ser Ql: NONREACTIVE

## 2020-10-02 MED ORDER — CLOPIDOGREL BISULFATE 75 MG PO TABS: 75.0000 mg | ORAL_TABLET | Freq: Every day | ORAL | 2 refills | Status: DC

## 2020-10-02 MED ORDER — ATORVASTATIN CALCIUM 40 MG PO TABS: 40.0000 mg | ORAL_TABLET | Freq: Every day | ORAL | 2 refills | Status: DC

## 2020-10-02 MED ORDER — ENOXAPARIN SODIUM 30 MG/0.3ML ~~LOC~~ SOLN: 30.0000 mg | SUBCUTANEOUS | Status: DC

## 2020-10-02 MED ORDER — POLYETHYLENE GLYCOL 3350 17 G PO PACK: 17.0000 g | PACK | Freq: Every day | ORAL | 0 refills | Status: DC | PRN

## 2020-10-02 MED ORDER — IOHEXOL 350 MG/ML SOLN
50.0000 mL | Freq: Once | INTRAVENOUS | Status: AC | PRN
  Administered 2020-10-02: 50 mL via INTRAVENOUS

## 2020-10-02 MED ORDER — ASPIRIN 81 MG PO CHEW: CHEWABLE_TABLET | ORAL | Status: DC

## 2020-10-02 NOTE — TOC Initial Note (Signed)
Transition of Care [facility]) - Initial/Assessment Note    Patient Details  Name: Sheila Mccoy MRN: [id number] Date of Birth: 1930-11-30  Transition of Care [facility]) CM/SW Contact:    Joanne Chars, LCSW Phone Number: 10/02/2020, 10:43 AM  Clinical Narrative:   CSW met with pt to discuss discharge plan.  Pt is not agreeable to SNF, wants to return to [facility] assisted living where she lives alone in independent living apartment with supportive services. Pt reports she has PT/OT on site and also has a HH aide who comes daily in the AM.  Pt two daughters, Diedre and [name] are also available for support.  Permission given to speak to daughters and [place].     Pt is vaccinated for covid.  Pt has following equipment at home: walker, rollator, shower chair. CSW spoke with pt daughter Diedre who is in agreement with plan of return to [facility].  Pt went to rehab twice before and had bad experiences both times.  Diedre confirmed what pt said about current services and said the [facility] aide can be increased with more hours in afternoon if needed.  CSW spoke with Barbaraann Share at [facility] who confirmed pt is resident there but said PT/OT is an outside contracted agency: Shirlean Mylar, [phone] is contact for PT/OT.  CSW spoke with Jackelyn Poling at that number who is another therapist there.  She confirmed that they will resume services, 5x week, with pt once she returns and asked for outpt orders to be faxed to: [phone].  Agency name: [employer], and they are considered outpt provider, not HH.               Expected Discharge Plan: Home/Self Care Barriers to Discharge: No Barriers Identified   Patient Goals and CMS Choice Patient states their goals for this hospitalization and ongoing recovery are:: "get back to being healthy"      Expected Discharge Plan and Services Expected Discharge Plan: Home/Self Care     Post Acute Care Choice:  (Assisted living with supportive  services) Living arrangements for the past 2 months: Assisted Living Facility                           HH Arranged: NA [place] Agency: NA        Prior Living Arrangements/Services Living arrangements for the past 2 months: [place] Lives with:: Self Patient language and need for interpreter reviewed:: Yes Do you feel safe going back to the place where you live?: Yes      Need for Family Participation in Patient Care: Yes (Comment) Care giver support system in place?: Yes (comment) Current home services: Homehealth aide,Home OT,Home PT Criminal Activity/Legal Involvement Pertinent to Current Situation/Hospitalization: No - Comment as needed  Activities of Daily Living      Permission Sought/Granted Permission sought to share information with : Family Chief Financial Officer Permission granted to share information with : Yes, Verbal Permission Granted  Share Information with NAME: daughters Diedre and Sales executive granted to share info w AGENCY: [employer]        Emotional Assessment Appearance:: Appears stated age Attitude/Demeanor/Rapport: Engaged Affect (typically observed): Appropriate,Pleasant Orientation: : Oriented to Self,Oriented to Place,Oriented to  Time,Oriented to Situation Alcohol / Substance Use: Not Applicable Psych Involvement: No (comment)  Admission diagnosis:  TIA (transient ischemic attack) [G45.9] Patient Active Problem List   Diagnosis Date Noted  . Hypothyroidism 09/30/2020  . Depression 09/30/2020  .  TIA (transient ischemic attack) 09/29/2020  . Anxiety 07/12/2020  . UTI (urinary tract infection) 07/09/2020  . Acute metabolic encephalopathy 37/29/0211  . Sacral fracture ([place]) 07/09/2020  . COPD (chronic obstructive pulmonary disease) ([place]) 07/09/2020  . Adnexal cyst 07/09/2020   PCP:  Buzzy Han, MD Pharmacy:   [facility] [address] [place] - [address] [place] [place] [address] Phone: [phone] Fax: [phone]     Social Determinants of Health (SDOH) Interventions    Readmission Risk Interventions No flowsheet data found.

## 2020-10-02 NOTE — Progress Notes (Signed)
Physical Therapy Treatment Patient Details Name: Sheila Mccoy MRN: [id number] DOB: 1931-01-05 Today's Date: 10/02/2020    History of Present Illness 86 year old female with a history of UTI, thyroid disease, COPD on ASA who reports that today noted difficulty with speech.    PT Comments    Pt was seen for mobility on rollator with cues for safety of direction, distance kept from walker and management of obstacles.  Her daughter is in attendance, able to manage safety with cues on walker and talked with daughter about follow up in her [place] arrangement as is available.  Pt is unable to walk alone due to her balance and her endurance of walking.  While O2 sats are maintained on room air, pt cannot tolerate even light resisted ex in sitting without having to take a rest.  See acutely for goals of PT.  Follow Up Recommendations  SNF     Equipment Recommendations  None recommended by PT    Recommendations for Other Services       Precautions / Restrictions Precautions Precautions: Fall Precaution Comments: monitor O2 sats with gait Restrictions Weight Bearing Restrictions: No    Mobility  Bed Mobility Overal bed mobility: Needs Assistance             General bed mobility comments: up in chair when PT arrived  Transfers Overall transfer level: Needs assistance Equipment used: Rolling walker (2 wheeled);1 person hand held assist Transfers: Sit to/from [employer] Sit to Stand: Min guard Stand pivot transfers: Min guard       General transfer comment: min guard for safety as pt is being reminded about correct hand placement  Ambulation/Gait Ambulation/Gait assistance: Min guard;Min assist Gait Distance (Feet): 120 Feet (80+40) Assistive device: 1 person hand held assist;4-wheeled walker Gait Pattern/deviations: Step-to pattern;Step-through pattern;Decreased stride length;Wide base of support;Shuffle Gait velocity: reduced Gait velocity interpretation:  <1.31 ft/sec, indicative of household ambulator General Gait Details: mild shuffling, flexed posture and slow pace, SOB with effort   Stairs             Wheelchair Mobility    Modified Rankin (Stroke Patients Only)       Balance Overall balance assessment: Needs assistance Sitting-balance support: Feet supported Sitting balance-Leahy Scale: Fair     Standing balance support: Bilateral upper extremity supported;During functional activity Standing balance-Leahy Scale: Fair Standing balance comment: requires cues for safety                            Cognition Arousal/Alertness: Awake/alert Behavior During Therapy: WFL for tasks assessed/performed Overall Cognitive Status: No family/caregiver present to determine baseline cognitive functioning                                 General Comments: daughter is present and able to observe her mother walking and exercising.  Has maintained O2 sats with mobility to 96% with gait      Exercises General Exercises - Lower Extremity Long Arc Quad: Strengthening;10 reps Heel Slides: Strengthening;10 reps Hip ABduction/ADduction: AROM;Strengthening;20 reps    General Comments General comments (skin integrity, edema, etc.): pt was on room air entire session, 98% sat for first walk then 96% sat for second part      Pertinent Vitals/Pain Pain Assessment: No/denies pain    Home Living  Prior Function            PT Goals (current goals can now be found in the care plan section) Acute Rehab PT Goals Patient Stated Goal: to get home instead of SNF PT Goal Formulation: With patient/family Progress towards PT goals: Progressing toward goals    Frequency    Min 3X/week      PT Plan Current plan remains appropriate    Co-evaluation              AM-PAC PT "6 Clicks" Mobility   Outcome Measure  Help needed turning from your back to your side while in a flat  bed without using bedrails?: A Little   Help needed moving to and from a bed to a chair (including a wheelchair)?: A Little Help needed standing up from a chair using your arms (e.g., wheelchair or bedside chair)?: A Little Help needed to walk in hospital room?: A Little Help needed climbing 3-5 steps with a railing? : A Little 6 Click Score: 15    End of Session Equipment Utilized During Treatment: Gait belt Activity Tolerance: Patient limited by fatigue Patient left: in chair;with call bell/phone within reach;with family/visitor present;with nursing/sitter in room Nurse Communication: Mobility status PT Visit Diagnosis: Unsteadiness on feet (R26.81);Other abnormalities of gait and mobility (R26.89);Muscle weakness (generalized) (M62.81)     Time: 3382-5053 PT Time Calculation (min) (ACUTE ONLY): 20 min  Charges:  $Gait Training: 8-22 mins          Ivar Drape 10/02/2020, 4:35 PM  Samul Dada, PT MS Acute Rehab Dept. Number: [facility] [id number] and [facility] [phone]

## 2020-10-02 NOTE — Discharge Summary (Signed)
Physician Discharge Summary  Shaylah Mcghie HKV:[id number] DOB: 1931-03-03 DOA: 09/29/2020  PCP: Buzzy Han, MD  Admit date: 09/29/2020 Discharge date: 10/02/2020  Admitted From [place] states Disposition: [facility]  recommendations for Outpatient Follow-up:  1. Follow up with PCP in 1-2 weeks 2. Please obtain BMP/CBC in one week 3. Follow-up with [facility] neurology 4. Follow-up with EP for event monitor  Home Health yes Equipment/Devices none Discharge Condition stable and improved CODE STATUS: DNR Diet recommendation: Cardiac Brief/Interim Summary:Shelia Wilkeis a 86 y.o.femalewith medical history significant ofanxiety/depression, COPD, hypothyroidism who presents with word finding difficulty. Patient lives at [place] . She was normal when she went to lunch and around 12:30 PM she began finding word finding difficulties. She states that she became frustrated and went back to her room. She contacted her daughter around 2:30 PM and her daughter brought her to the ED at [facility] for further evaluation. Her symptoms were improving in the ED. She denies fever, cough, chills, chest pain, shortness of breath, constipation, diarrhea, nausea. She denies any other symptoms.  ED Course:Vitals in ED significant for blood pressure in the 564P to 329J systolic. Lab work-up showed BMP with sodium of 131. LFTs with alk phos of 131. CBC with normal limits. PT PTT and INR pending. Respiratory panel for flu and Covid negative. Ethanol level negative, UDS negative, urinalysis normal. CT head was negative for acute abnormality. Teleneurology was consulted who recommended work-up for TIA.   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  She still does have occasional word finding difficulty. MRI brain showed no associated hemorrhage or mass-effect.  4 to 5 mm foci of diffusion abnormality involving the cortical and subcortical aspect of the right parietal lobe consistent with small acute or early subacute ischemic infarcts.  Age-related cerebral atrophy with moderate chronic small vessel ischemic changes. Echocardiogram-fraction 60 to 65%. No wall motion abnormalities. Grade 2 diastolic dysfunction. A1c 5.2 Lipid panel LDL is 134 continue statin. She passed bedside swallow evaluation and is tolerating diet Carotid ultrasound with 1 to 39% stenosis. She was on aspirin prior to admission to the hospital.  She has been started on aspirin Plavix and statin. Neuro recommends dual antiplatelet agents for 3 weeks aspirin and Plavix and then Plavix alone.  Follow-up with [facility] neurology.   #2 history of COPD stable  #3 hypothyroidism continue Synthroid  #4 history of anxiety and depression continue home medications including mirtazapine Risperdal and bupropion.   Estimated body mass index is 23.91 kg/m as calculated from the following:   Height as of this encounter: 5' 3"  (1.6 m).   Weight as of this encounter: 61.2 kg.  Discharge Instructions  Discharge Instructions    Ambulatory referral to Neurology   Complete by: As directed    Follow up with stroke clinic NP (Jessica Vanschaick or Cecille Rubin, if both not available, consider Zachery Dauer, or Ahern) at [facility] in about 4 weeks. Thanks.   Diet - low sodium heart healthy   Complete by: As directed    Diet - low sodium heart healthy   Complete by: As directed    Increase activity slowly   Complete by: As directed    Increase activity slowly   Complete by: As directed      Allergies  as of 10/02/2020      Reactions   Hydromorphone    Very Disoriented & Confused   Macrobid [nitrofurantoin] Other (See Comments)   tired       Medication List    STOP taking these medications   ciprofloxacin 250 MG tablet Commonly known as: CIPRO     TAKE these medications   albuterol 108 (90 Base) MCG/ACT inhaler Commonly known as: VENTOLIN HFA Inhale 1-2 puffs into the lungs every 6 (six) hours as needed for wheezing or shortness of breath.   aspirin 81 MG chewable tablet Commonly known as: Aspirin 81 Continue aspirin with Plavix for 21 days.  Then stop the aspirin and continue only Plavix indefinitely. What changed:   how much to take  how to take this  when to take this  additional instructions   atorvastatin 40 MG tablet Commonly known as: LIPITOR Take 1 tablet (40 mg total) by mouth daily. Start taking on: October 03, 2020   budesonide-formoterol 80-4.5 MCG/ACT inhaler Commonly known as: SYMBICORT Inhale 1 puff into the lungs 2 (two) times daily.   buPROPion 75 MG tablet Commonly known as: WELLBUTRIN Take 75 mg by mouth daily.   clopidogrel 75 MG tablet Commonly known as: PLAVIX Take 1 tablet (75 mg total) by mouth daily. Start taking on: October 03, 2020   diclofenac Sodium 1 % Gel Commonly known as: VOLTAREN Apply 2 g topically 3 (three) times daily as needed (pain).   dorzolamide-timolol 22.3-6.8 MG/ML ophthalmic solution Commonly known as: COSOPT Place 1 drop into the left eye 2 (two) times daily.   furosemide 20 MG tablet Commonly known as: LASIX Take 1 tablet (20 mg total) by mouth daily as needed for fluid or edema.   latanoprost 0.005 % ophthalmic solution Commonly known as: XALATAN Place 1 drop into both eyes at bedtime.   levothyroxine 75 MCG tablet Commonly known as: SYNTHROID Take 75 mcg by mouth daily before breakfast.   mirtazapine 15 MG tablet Commonly known as: REMERON Take 15 mg by mouth at bedtime.   pantoprazole 40 MG tablet Commonly known as: Protonix Take 1 tablet (40 mg total) by mouth daily. What changed:   when to take this  reasons to take this    polyethylene glycol 17 g packet Commonly known as: MIRALAX / GLYCOLAX Take 17 g by mouth daily as needed for moderate constipation.   risperiDONE 0.5 MG tablet Commonly known as: RISPERDAL Take 0.5 mg by mouth at bedtime.       Follow-up Information    Buzzy Han, MD Follow up.   Specialty: Family Medicine Contact information: [place] [address] [phone]        [place]. Schedule an appointment as soon as possible for a visit in 4 week(s).   Contact information: [address]     [place]  [phone]       Shirley Friar, PA-C Follow up on 10/19/2020.   Specialty: Physician Assistant Why: at 1 pm for post hospital and cardiac event monitor set up. If you have not yet completed the event monitor the week prior to this visit, please call and reschedule. Contact information: [address] [place] [address] [phone]              Allergies  Allergen Reactions  . Hydromorphone     Very Disoriented & Confused  . Macrobid [Nitrofurantoin] Other (See Comments)    tired  Consultations:  Neurology   Procedures/Studies: CT ANGIO HEAD W OR WO CONTRAST  Result Date: 10/02/2020 CLINICAL DATA:  Stroke/TIA. EXAM: CT ANGIOGRAPHY HEAD AND NECK TECHNIQUE: Multidetector CT imaging of the head and neck was performed using the standard protocol during bolus administration of intravenous contrast. Multiplanar CT image reconstructions and MIPs were obtained to evaluate the vascular anatomy. Carotid stenosis measurements (when applicable) are obtained utilizing NASCET criteria, using the distal internal carotid diameter as the denominator. CONTRAST:  79m OMNIPAQUE IOHEXOL 350 MG/ML SOLN COMPARISON:  MRI September 30, 2020. CT head September 29, 2020. FINDINGS: CT HEAD FINDINGS Brain: No evidence of interval acute large vascular territory infarction,  hemorrhage, hydrocephalus, extra-axial collection or mass lesion/mass effect. Known acute/subacute infarcts in the right parieto-occipital lobe were better characterized on recent MRI. Patchy white matter hypoattenuation, compatible with chronic microvascular ischemic disease. Vascular: Calcific atherosclerosis. Skull: No acute fracture. Sinuses/orbits: Visualized sinuses are clear.  Unremarkable orbits. Other: No mastoid effusions. Review of the MIP images confirms the above findings CTA NECK FINDINGS Aortic arch: Great vessel origins are patent. Mild atherosclerotic narrowing of the left subclavian artery origin Right carotid system: No evidence of dissection, stenosis (50% or greater) or occlusion. Mixed calcific and noncalcific atherosclerosis at the carotid bifurcation without greater than 50% narrowing. Left carotid system: Mild atherosclerotic narrowing of the common carotid artery origin. Predominantly noncalcific atherosclerosis at the carotid bifurcation with approximately 40% stenosis of the internal carotid artery origin. Vertebral arteries: Left dominant. No evidence of significant (greater than 50%) stenosis. Skeleton: No acute findings. Mild multilevel so for coal degenerative change. Other neck: No mass or suspicious adenopathy Upper chest: Fibrotic changes in bilateral upper lungs and bronchial wall thickening. Review of the MIP images confirms the above findings CTA HEAD FINDINGS Anterior circulation: Predominately calcific atherosclerosis of bilateral cavernous and paraclinoid internal carotid arteries without specific evidence of greater than 50% stenosis. Bilateral M1 and proximal M2 MCAs are patent without evidence of hemodynamically significant stenosis. Mild to moderate multifocal narrowing of more distal MCA branches, likely related to atherosclerosis. Bilateral A1 and A2 ACAs are patent without evidence of hemodynamically significant stenosis. No aneurysm identified. Posterior circulation:  Diminutive right vertebral artery. Bilateral intradural vertebral arteries and basilar artery are patent without evidence of hemodynamically significant stenosis. Bilateral PCAs are patent. Mild right P1 PCA stenosis. Moderate to severe left proximal P2 PCA stenosis. Venous sinuses: As permitted by contrast timing, patent. Review of the MIP images confirms the above findings IMPRESSION: 1. No evidence of acute intracranial abnormality.Known acute/subacute infarcts in the right parieto-occipital lobe were better characterized on recent MRI. 2. No large vessel occlusion. 3. Moderate to severe proximal left P2 PCA stenosis. Mild right P1 PCA stenosis. 4. Bilateral carotid bifurcation atherosclerosis with approximately 40% narrowing of the proximal left internal carotid artery. Electronically Signed   By: FMargaretha SheffieldMD   On: 10/02/2020 11:52   CT ANGIO NECK W OR WO CONTRAST  Result Date: 10/02/2020 CLINICAL DATA:  Stroke/TIA. EXAM: CT ANGIOGRAPHY HEAD AND NECK TECHNIQUE: Multidetector CT imaging of the head and neck was performed using the standard protocol during bolus administration of intravenous contrast. Multiplanar CT image reconstructions and MIPs were obtained to evaluate the vascular anatomy. Carotid stenosis measurements (when applicable) are obtained utilizing NASCET criteria, using the distal internal carotid diameter as the denominator. CONTRAST:  534mOMNIPAQUE IOHEXOL 350 MG/ML SOLN COMPARISON:  MRI September 30, 2020. CT head September 29, 2020. FINDINGS: CT HEAD FINDINGS Brain: No evidence of interval acute large  vascular territory infarction, hemorrhage, hydrocephalus, extra-axial collection or mass lesion/mass effect. Known acute/subacute infarcts in the right parieto-occipital lobe were better characterized on recent MRI. Patchy white matter hypoattenuation, compatible with chronic microvascular ischemic disease. Vascular: Calcific atherosclerosis. Skull: No acute fracture. Sinuses/orbits:  Visualized sinuses are clear.  Unremarkable orbits. Other: No mastoid effusions. Review of the MIP images confirms the above findings CTA NECK FINDINGS Aortic arch: Great vessel origins are patent. Mild atherosclerotic narrowing of the left subclavian artery origin Right carotid system: No evidence of dissection, stenosis (50% or greater) or occlusion. Mixed calcific and noncalcific atherosclerosis at the carotid bifurcation without greater than 50% narrowing. Left carotid system: Mild atherosclerotic narrowing of the common carotid artery origin. Predominantly noncalcific atherosclerosis at the carotid bifurcation with approximately 40% stenosis of the internal carotid artery origin. Vertebral arteries: Left dominant. No evidence of significant (greater than 50%) stenosis. Skeleton: No acute findings. Mild multilevel so for coal degenerative change. Other neck: No mass or suspicious adenopathy Upper chest: Fibrotic changes in bilateral upper lungs and bronchial wall thickening. Review of the MIP images confirms the above findings CTA HEAD FINDINGS Anterior circulation: Predominately calcific atherosclerosis of bilateral cavernous and paraclinoid internal carotid arteries without specific evidence of greater than 50% stenosis. Bilateral M1 and proximal M2 MCAs are patent without evidence of hemodynamically significant stenosis. Mild to moderate multifocal narrowing of more distal MCA branches, likely related to atherosclerosis. Bilateral A1 and A2 ACAs are patent without evidence of hemodynamically significant stenosis. No aneurysm identified. Posterior circulation: Diminutive right vertebral artery. Bilateral intradural vertebral arteries and basilar artery are patent without evidence of hemodynamically significant stenosis. Bilateral PCAs are patent. Mild right P1 PCA stenosis. Moderate to severe left proximal P2 PCA stenosis. Venous sinuses: As permitted by contrast timing, patent. Review of the MIP images  confirms the above findings IMPRESSION: 1. No evidence of acute intracranial abnormality.Known acute/subacute infarcts in the right parieto-occipital lobe were better characterized on recent MRI. 2. No large vessel occlusion. 3. Moderate to severe proximal left P2 PCA stenosis. Mild right P1 PCA stenosis. 4. Bilateral carotid bifurcation atherosclerosis with approximately 40% narrowing of the proximal left internal carotid artery. Electronically Signed   By: Margaretha Sheffield MD   On: 10/02/2020 11:52   MR BRAIN WO CONTRAST  Result Date: 09/30/2020 CLINICAL DATA:  Initial evaluation for acute neuro deficit. EXAM: MRI HEAD WITHOUT CONTRAST TECHNIQUE: Multiplanar, multiecho pulse sequences of the brain and surrounding structures were obtained without intravenous contrast. COMPARISON:  Prior head CT from 09/29/2020 FINDINGS: Brain: Generalized age-related cerebral atrophy. Patchy and confluent T2/FLAIR hyperintensity within the periventricular deep white matter both cerebral hemispheres most consistent with chronic small vessel ischemic disease, moderate in nature. Mild patchy involvement of the pons noted. 4 mm focus of restricted diffusion seen involving the cortical/subcortical aspect of the right parietal lobe (series 5, image 75). Additional adjacent 4-5 mm focus of mild diffusion abnormality noted at the subcortical right parietal lobe as well (series 5, image 76), consistent with early subacute ischemic change. No associated hemorrhage or mass effect. No other foci of susceptibility artifact to suggest acute or subacute ischemia. Gray-white matter differentiation otherwise maintained. No encephalomalacia to suggest chronic cortical infarction. No foci of susceptibility artifact to suggest acute or chronic intracranial hemorrhage. No mass lesion, midline shift or mass effect. No hydrocephalus or extra-axial fluid collection. Pituitary gland suprasellar region within normal limits. Midline structures intact.  Vascular: Major intracranial vascular flow voids are maintained. Skull and upper cervical spine: Craniocervical junction within  normal limits. Bone marrow signal intensity normal. No scalp soft tissue abnormality. Sinuses/Orbits: Patient status post bilateral ocular lens replacement. Globes and orbital soft tissues demonstrate no acute finding. Paranasal sinuses are largely clear. No significant mastoid effusion. Other: None. IMPRESSION: 1. Two separate 4-5 mm foci of diffusion abnormality involving the cortical and subcortical aspect of the right parietal lobe, consistent with small acute to early subacute ischemic infarcts. No associated hemorrhage or mass effect. 2. No other acute intracranial abnormality. 3. Age-related cerebral atrophy with moderate chronic small vessel ischemic disease. Electronically Signed   By: Jeannine Boga M.D.   On: 09/30/2020 02:23   ECHOCARDIOGRAM COMPLETE  Result Date: 09/30/2020    ECHOCARDIOGRAM REPORT   Patient Name:   SURIAH PERAGINE Date of Exam: 09/30/2020 Medical Rec #:  [id number]     Height:       63.0 in Accession #:    [id number]    Weight:       135.0 lb Date of Birth:  1931/08/03      BSA:          1.636 m Patient Age:    86 years      BP:           126/74 mmHg Patient Gender: F             HR:           67 bpm. Exam Location:  Inpatient Procedure: 2D Echo, Cardiac Doppler and Color Doppler Indications:    Stroke  History:        Patient has prior history of Echocardiogram examinations, most                 recent 05/16/2020. Signs/Symptoms:Altered Mental Status.  Sonographer:    Merrie Roof RDCS Referring Phys: [id number] [place]  1. Left ventricular ejection fraction, by estimation, is 60 to 65%. The left ventricle has normal function. The left ventricle has no regional wall motion abnormalities. There is moderate concentric left ventricular hypertrophy. Left ventricular diastolic parameters are consistent with Grade II diastolic dysfunction  (pseudonormalization). Elevated left atrial pressure.  2. Right ventricular systolic function is normal. The right ventricular size is normal.  3. The mitral valve is normal in structure. Mild to moderate mitral valve regurgitation. No evidence of mitral stenosis. Moderate mitral annular calcification.  4. The aortic valve is normal in structure. There is mild calcification of the aortic valve. There is moderate thickening of the aortic valve. Aortic valve regurgitation is mild. Mild to moderate aortic valve sclerosis/calcification is present, without any evidence of aortic stenosis.  5. The inferior vena cava is normal in size with greater than 50% respiratory variability, suggesting right atrial pressure of 3 mmHg. Conclusion(s)/Recommendation(s): No intracardiac source of embolism detected on this transthoracic study. A transesophageal echocardiogram is recommended to exclude cardiac source of embolism if clinically indicated. FINDINGS  Left Ventricle: Left ventricular ejection fraction, by estimation, is 60 to 65%. The left ventricle has normal function. The left ventricle has no regional wall motion abnormalities. The left ventricular internal cavity size was normal in size. There is  moderate concentric left ventricular hypertrophy. Left ventricular diastolic parameters are consistent with Grade II diastolic dysfunction (pseudonormalization). Elevated left atrial pressure. Right Ventricle: The right ventricular size is normal. No increase in right ventricular wall thickness. Right ventricular systolic function is normal. Left Atrium: Left atrial size was normal in size. Right Atrium: Right atrial size was normal in size. Pericardium: There is no  evidence of pericardial effusion. Mitral Valve: The mitral valve is normal in structure. There is moderate thickening of the mitral valve leaflet(s). There is moderate calcification of the mitral valve leaflet(s). Moderate mitral annular calcification. Mild to moderate  mitral valve regurgitation. No evidence of mitral valve stenosis. Tricuspid Valve: The tricuspid valve is normal in structure. Tricuspid valve regurgitation is not demonstrated. No evidence of tricuspid stenosis. Aortic Valve: The aortic valve is normal in structure. There is mild calcification of the aortic valve. There is moderate thickening of the aortic valve. Aortic valve regurgitation is mild. Mild to moderate aortic valve sclerosis/calcification is present, without any evidence of aortic stenosis. Aortic valve mean gradient measures 2.0 mmHg. Aortic valve peak gradient measures 4.5 mmHg. Aortic valve area, by VTI measures 2.38 cm. Pulmonic Valve: The pulmonic valve was normal in structure. Pulmonic valve regurgitation is mild. No evidence of pulmonic stenosis. Aorta: The aortic root is normal in size and structure. Venous: The inferior vena cava is normal in size with greater than 50% respiratory variability, suggesting right atrial pressure of 3 mmHg. IAS/Shunts: No atrial level shunt detected by color flow Doppler.  LEFT VENTRICLE PLAX 2D LVIDd:         3.70 cm LVIDs:         2.70 cm LV PW:         1.20 cm LV IVS:        1.30 cm LVOT diam:     1.70 cm LV SV:         47 LV SV Index:   29 LVOT Area:     2.27 cm  LV Volumes (MOD) LV vol d, MOD A4C: 44.5 ml LV vol s, MOD A4C: 17.0 ml LV SV MOD A4C:     44.5 ml LEFT ATRIUM           Index       RIGHT ATRIUM           Index LA diam:      2.80 cm 1.71 cm/m  RA Area:     13.00 cm LA Vol (A2C): 47.1 ml 28.79 ml/m RA Volume:   27.40 ml  16.75 ml/m  AORTIC VALVE AV Area (Vmax):    2.10 cm AV Area (Vmean):   2.09 cm AV Area (VTI):     2.38 cm AV Vmax:           106.00 cm/s AV Vmean:          68.800 cm/s AV VTI:            0.199 m AV Peak Grad:      4.5 mmHg AV Mean Grad:      2.0 mmHg LVOT Vmax:         97.90 cm/s LVOT Vmean:        63.400 cm/s LVOT VTI:          0.209 m LVOT/AV VTI ratio: 1.05  AORTA Ao Root diam: 3.00 cm  SHUNTS Systemic VTI:  0.21 m  Systemic Diam: 1.70 cm Ena Dawley MD Electronically signed by Ena Dawley MD Signature Date/Time: 09/30/2020/12:32:35 PM    Final    CT HEAD CODE STROKE WO CONTRAST  Result Date: 09/29/2020 CLINICAL DATA:  Code stroke.  Aphasia. EXAM: CT HEAD WITHOUT CONTRAST TECHNIQUE: Contiguous axial images were obtained from the base of the skull through the vertex without intravenous contrast. COMPARISON:  07/09/2020 FINDINGS: Brain: No acute cortically based infarct, intracranial hemorrhage, mass, midline shift, or extra-axial fluid  collection is identified. Patchy hypodensities are again seen in the cerebral white matter bilaterally and are nonspecific but compatible with moderate chronic small vessel ischemic disease, overall similar to the prior study though with an interval age indeterminate lacunar infarct not excluded in the left corona radiata. There is mild cerebral atrophy. Vascular: Calcified atherosclerosis at the skull base. No hyperdense vessel. Skull: No fracture or suspicious osseous lesion. Sinuses/Orbits: Visualized paranasal sinuses and mastoid air cells are clear. Bilateral cataract extraction. Other: None. ASPECTS [facility] Stroke Program Early CT Score) - Ganglionic level infarction (caudate, lentiform nuclei, internal capsule, insula, M1-M3 cortex): 7 - Supraganglionic infarction (M4-M6 cortex): 3 Total score (0-10 with 10 being normal): 10 IMPRESSION: 1. No evidence of acute cortically based infarct or intracranial hemorrhage. 2. ASPECTS is 10. 3. Moderate chronic small vessel ischemic disease with question of an interval age indeterminate lacunar infarct in left corona radiata. These results were called by telephone at the time of interpretation on 09/29/2020 at 4:41 pm to Dr. Sherwood Gambler, who verbally acknowledged these results. Electronically Signed   By: Logan Bores M.D.   On: 09/29/2020 16:42   VAS US CAROTID (at [facility] and [facility] only)  Result Date: 09/30/2020 Carotid Arterial Duplex  Study Indications:       TIA. Risk Factors:      None. Comparison Study:  No prior studies. Performing Technologist: Oliver Hum RVT  Examination Guidelines: A complete evaluation includes B-mode imaging, spectral Doppler, color Doppler, and power Doppler as needed of all accessible portions of each vessel. Bilateral testing is considered an integral part of a complete examination. Limited examinations for reoccurring indications may be performed as noted.  Right Carotid Findings: +----------+--------+-------+--------+--------------------------------+--------+           PSV cm/sEDV    StenosisPlaque Description              Comments                   cm/s                                                    +----------+--------+-------+--------+--------------------------------+--------+ CCA Prox  76      12             smooth and heterogenous                  +----------+--------+-------+--------+--------------------------------+--------+ CCA Distal64      15             smooth and heterogenous                  +----------+--------+-------+--------+--------------------------------+--------+ ICA Prox  58      18             smooth, heterogenous and        tortuous                                  calcific                                 +----------+--------+-------+--------+--------------------------------+--------+ ICA Distal62      17  tortuous +----------+--------+-------+--------+--------------------------------+--------+ ECA       67      10                                                      +----------+--------+-------+--------+--------------------------------+--------+ +----------+--------+-------+--------+-------------------+           PSV cm/sEDV cmsDescribeArm Pressure (mmHG) +----------+--------+-------+--------+-------------------+ [id number]                                          +----------+--------+-------+--------+-------------------+ +---------+--------+--+--------+--+---------+ VertebralPSV cm/s49EDV cm/s11Antegrade +---------+--------+--+--------+--+---------+  Left Carotid Findings: +----------+--------+--------+--------+-----------------------+--------+           PSV cm/sEDV cm/sStenosisPlaque Description     Comments +----------+--------+--------+--------+-----------------------+--------+ CCA Prox  76      16              smooth and homogeneous tortuous +----------+--------+--------+--------+-----------------------+--------+ CCA Distal57      11              smooth and heterogenous         +----------+--------+--------+--------+-----------------------+--------+ ICA Prox  147     32              smooth and heterogenous         +----------+--------+--------+--------+-----------------------+--------+ ICA Distal67      12                                     tortuous +----------+--------+--------+--------+-----------------------+--------+ ECA       82      8                                               +----------+--------+--------+--------+-----------------------+--------+ +----------+--------+--------+--------+-------------------+           PSV cm/sEDV cm/sDescribeArm Pressure (mmHG) +----------+--------+--------+--------+-------------------+ [id number]                                          +----------+--------+--------+--------+-------------------+ +---------+--------+--+--------+--+---------+ VertebralPSV cm/s47EDV cm/s10Antegrade +---------+--------+--+--------+--+---------+   Summary: Right Carotid: Velocities in the right ICA are consistent with a 1-39% stenosis. Left Carotid: Velocities in the left ICA are consistent with a 1-39% stenosis. Vertebrals: Bilateral vertebral arteries demonstrate antegrade flow. *See table(s) above for measurements and observations.  Electronically signed by Antony Contras MD on  09/30/2020 at 2:39:56 PM.    Final    (Echo, Carotid, EGD, Colonoscopy, ERCP)    Subjective:  Patient sitting up in bed awake and alert speech back to normal Discharge Exam: Vitals:   10/02/20 0726 10/02/20 1347  BP:  (!) 111/53  Pulse:  66  Resp:  19  Temp:  97.7 F (36.5 C)  SpO2: 94% 100%   Vitals:   10/01/20 2052 10/02/20 0518 10/02/20 0726 10/02/20 1347  BP: (!) 111/53 (!) 145/73  (!) 111/53  Pulse: 69 (!) 58  66  Resp: 19 18  19   Temp: (!) 96.7 F (35.9 C) 97.6 F (36.4 C)  97.7 F (36.5 C)  TempSrc:  Oral  SpO2: 96% 100% 94% 100%  Weight:      Height:        General: Pt is alert, awake, not in acute distress Cardiovascular: RRR, S1/S2 +, no rubs, no gallops Respiratory: CTA bilaterally, no wheezing, no rhonchi Abdominal: Soft, NT, ND, bowel sounds + Extremities: no edema, no cyanosis    The results of significant diagnostics from this hospitalization (including imaging, microbiology, ancillary and laboratory) are listed below for reference.     Microbiology: Recent Results (from the past 240 hour(s))  Resp Panel by RT-PCR (Flu A&B, Covid) Nasopharyngeal Swab     Status: None   Collection Time: 09/29/20  5:35 PM   Specimen: Nasopharyngeal Swab; Nasopharyngeal(NP) swabs in vial transport medium  Result Value Ref Range Status   SARS Coronavirus 2 by RT PCR NEGATIVE NEGATIVE Final    Comment: (NOTE) SARS-CoV-2 target nucleic acids are NOT DETECTED.  The SARS-CoV-2 RNA is generally detectable in upper respiratory specimens during the acute phase of infection. The lowest concentration of SARS-CoV-2 viral copies this assay can detect is 138 copies/mL. A negative result does not preclude SARS-Cov-2 infection and should not be used as the sole basis for treatment or other patient management decisions. A negative result may occur with  improper specimen collection/handling, submission of specimen other than nasopharyngeal swab, presence of viral mutation(s)  within the areas targeted by this assay, and inadequate number of viral copies(<138 copies/mL). A negative result must be combined with clinical observations, patient history, and epidemiological information. The expected result is Negative.  Fact Sheet for Patients:  EntrepreneurPulse.com.au  Fact Sheet for Healthcare Providers:  IncredibleEmployment.be  This test is no t yet approved or cleared by the [place] FDA and  has been authorized for detection and/or diagnosis of SARS-CoV-2 by FDA under an Emergency Use Authorization (EUA). This EUA will remain  in effect (meaning this test can be used) for the duration of the COVID-19 declaration under Section 564(b)(1) of the Act, 21 U.S.C.section 360bbb-3(b)(1), unless the authorization is terminated  or revoked sooner.       Influenza A by PCR NEGATIVE NEGATIVE Final   Influenza B by PCR NEGATIVE NEGATIVE Final    Comment: (NOTE) The Xpert Xpress SARS-CoV-2/FLU/RSV plus assay is intended as an aid in the diagnosis of influenza from Nasopharyngeal swab specimens and should not be used as a sole basis for treatment. Nasal washings and aspirates are unacceptable for Xpert Xpress SARS-CoV-2/FLU/RSV testing.  Fact Sheet for Patients: EntrepreneurPulse.com.au  Fact Sheet for Healthcare Providers: IncredibleEmployment.be  This test is not yet approved or cleared by the [place] FDA and has been authorized for detection and/or diagnosis of SARS-CoV-2 by FDA under an Emergency Use Authorization (EUA). This EUA will remain in effect (meaning this test can be used) for the duration of the COVID-19 declaration under Section 564(b)(1) of the Act, 21 U.S.C. section 360bbb-3(b)(1), unless the authorization is terminated or revoked.  Performed at [facility], [address] [place], [place] [address]      Labs: BNP (last 3 results) Recent  Labs    07/11/20 0722  BNP 706.2*   Basic Metabolic Panel: Recent Labs  Lab 09/29/20 1644 10/02/20 0108  NA 131* 134*  K 3.9 4.8  CL 95* 97*  CO2 25 26  GLUCOSE 95 102*  BUN 13 24*  CREATININE 0.85 1.53*  CALCIUM 9.9 9.9   Liver Function Tests: Recent Labs  Lab 09/29/20 1644  AST 28  ALT  28  ALKPHOS 131*  BILITOT 0.4  PROT 8.0  ALBUMIN 4.3   No results for input(s): LIPASE, AMYLASE in the last 168 hours. No results for input(s): AMMONIA in the last 168 hours. CBC: Recent Labs  Lab 09/29/20 1644 10/01/20 1033 10/02/20 0108  WBC 9.5 10.9* 9.3  NEUTROABS 5.2  --   --   HGB 13.5 13.4 13.9  HCT 39.9 40.8 40.9  MCV 93.4 93.2 91.5  PLT 373 416* 395   Cardiac Enzymes: No results for input(s): CKTOTAL, CKMB, CKMBINDEX, TROPONINI in the last 168 hours. BNP: Invalid input(s): POCBNP CBG: No results for input(s): GLUCAP in the last 168 hours. D-Dimer No results for input(s): DDIMER in the last 72 hours. Hgb A1c Recent Labs    09/30/20 0232  HGBA1C 5.2   Lipid Profile Recent Labs    09/30/20 0232  CHOL 208*  HDL 58  LDLCALC 134*  TRIG 81  CHOLHDL 3.6   Thyroid function studies Recent Labs    10/01/20 1033  TSH 1.184   Anemia work up Recent Labs    10/01/20 1033  VITAMINB12 1,143*  FOLATE 14.9   Urinalysis    Component Value Date/Time   COLORURINE YELLOW 09/29/2020 1750   APPEARANCEUR CLEAR 09/29/2020 1750   LABSPEC 1.010 09/29/2020 1750   PHURINE 6.0 09/29/2020 1750   GLUCOSEU NEGATIVE 09/29/2020 1750   HGBUR NEGATIVE 09/29/2020 1750   BILIRUBINUR NEGATIVE 09/29/2020 1750   KETONESUR NEGATIVE 09/29/2020 1750   PROTEINUR NEGATIVE 09/29/2020 1750   NITRITE NEGATIVE 09/29/2020 1750   LEUKOCYTESUR NEGATIVE 09/29/2020 1750   Sepsis Labs Invalid input(s): PROCALCITONIN,  WBC,  LACTICIDVEN Microbiology Recent Results (from the past 240 hour(s))  Resp Panel by RT-PCR (Flu A&B, Covid) Nasopharyngeal Swab     Status: None   Collection  Time: 09/29/20  5:35 PM   Specimen: Nasopharyngeal Swab; Nasopharyngeal(NP) swabs in vial transport medium  Result Value Ref Range Status   SARS Coronavirus 2 by RT PCR NEGATIVE NEGATIVE Final    Comment: (NOTE) SARS-CoV-2 target nucleic acids are NOT DETECTED.  The SARS-CoV-2 RNA is generally detectable in upper respiratory specimens during the acute phase of infection. The lowest concentration of SARS-CoV-2 viral copies this assay can detect is 138 copies/mL. A negative result does not preclude SARS-Cov-2 infection and should not be used as the sole basis for treatment or other patient management decisions. A negative result may occur with  improper specimen collection/handling, submission of specimen other than nasopharyngeal swab, presence of viral mutation(s) within the areas targeted by this assay, and inadequate number of viral copies(<138 copies/mL). A negative result must be combined with clinical observations, patient history, and epidemiological information. The expected result is Negative.  Fact Sheet for Patients:  EntrepreneurPulse.com.au  Fact Sheet for Healthcare Providers:  IncredibleEmployment.be  This test is no t yet approved or cleared by the [place] FDA and  has been authorized for detection and/or diagnosis of SARS-CoV-2 by FDA under an Emergency Use Authorization (EUA). This EUA will remain  in effect (meaning this test can be used) for the duration of the COVID-19 declaration under Section 564(b)(1) of the Act, 21 U.S.C.section 360bbb-3(b)(1), unless the authorization is terminated  or revoked sooner.       Influenza A by PCR NEGATIVE NEGATIVE Final   Influenza B by PCR NEGATIVE NEGATIVE Final    Comment: (NOTE) The Xpert Xpress SARS-CoV-2/FLU/RSV plus assay is intended as an aid in the diagnosis of influenza from Nasopharyngeal swab specimens and should not  be used as a sole basis for treatment. Nasal washings  and aspirates are unacceptable for Xpert Xpress SARS-CoV-2/FLU/RSV testing.  Fact Sheet for Patients: EntrepreneurPulse.com.au  Fact Sheet for Healthcare Providers: IncredibleEmployment.be  This test is not yet approved or cleared by the [place] FDA and has been authorized for detection and/or diagnosis of SARS-CoV-2 by FDA under an Emergency Use Authorization (EUA). This EUA will remain in effect (meaning this test can be used) for the duration of the COVID-19 declaration under Section 564(b)(1) of the Act, 21 U.S.C. section 360bbb-3(b)(1), unless the authorization is terminated or revoked.  Performed at [facility], [address] [place], [place] [address]      Time coordinating discharge:  39 minutes  SIGNED:   Georgette Shell, MD  [facility] 10/02/2020, 2:16 PM

## 2020-10-02 NOTE — Progress Notes (Signed)
STROKE TEAM PROGRESS NOTE   INTERVAL HISTORY No family is at the bedside. Pt lying in bed, eating grapes. She stated that the other day she was in dinning area of her assistant living facility, talking with her friends and all of sudden she was talking gibberish. She said it lasted 5 min and resolved. She went back to her room and called her daughter and then she was sent to hospital for check out. Now she felt she is back to her baseline.   Vitals:   10/01/20 2052 10/02/20 0518 10/02/20 0726 10/02/20 1347  BP: (!) 111/53 (!) 145/73  (!) 111/53  Pulse: 69 (!) 58  66  Resp: 19 18  19   Temp: (!) 96.7 F (35.9 C) 97.6 F (36.4 C)  97.7 F (36.5 C)  TempSrc:    Oral  SpO2: 96% 100% 94% 100%  Weight:      Height:       CBC:  Recent Labs  Lab 09/29/20 1644 10/01/20 1033 10/02/20 0108  WBC 9.5 10.9* 9.3  NEUTROABS 5.2  --   --   HGB 13.5 13.4 13.9  HCT 39.9 40.8 40.9  MCV 93.4 93.2 91.5  PLT 373 416* 395   Basic Metabolic Panel:  Recent Labs  Lab 09/29/20 1644 10/02/20 0108  NA 131* 134*  K 3.9 4.8  CL 95* 97*  CO2 25 26  GLUCOSE 95 102*  BUN 13 24*  CREATININE 0.85 1.53*  CALCIUM 9.9 9.9   Lipid Panel:  Recent Labs  Lab 09/30/20 0232  CHOL 208*  TRIG 81  HDL 58  CHOLHDL 3.6  VLDL 16  LDLCALC 161134*   HgbA1c:  Recent Labs  Lab 09/30/20 0232  HGBA1C 5.2   Urine Drug Screen:  Recent Labs  Lab 09/29/20 1750  LABOPIA NONE DETECTED  COCAINSCRNUR NONE DETECTED  LABBENZ NONE DETECTED  AMPHETMU NONE DETECTED  THCU NONE DETECTED  LABBARB NONE DETECTED    Alcohol Level  Recent Labs  Lab 09/29/20 1644  ETH <10    IMAGING past 24 hours CT ANGIO HEAD W OR WO CONTRAST  Result Date: 10/02/2020 CLINICAL DATA:  Stroke/TIA. EXAM: CT ANGIOGRAPHY HEAD AND NECK TECHNIQUE: Multidetector CT imaging of the head and neck was performed using the standard protocol during bolus administration of intravenous contrast. Multiplanar CT image reconstructions and MIPs were  obtained to evaluate the vascular anatomy. Carotid stenosis measurements (when applicable) are obtained utilizing NASCET criteria, using the distal internal carotid diameter as the denominator. CONTRAST:  50mL OMNIPAQUE IOHEXOL 350 MG/ML SOLN COMPARISON:  MRI September 30, 2020. CT head September 29, 2020. FINDINGS: CT HEAD FINDINGS Brain: No evidence of interval acute large vascular territory infarction, hemorrhage, hydrocephalus, extra-axial collection or mass lesion/mass effect. Known acute/subacute infarcts in the right parieto-occipital lobe were better characterized on recent MRI. Patchy white matter hypoattenuation, compatible with chronic microvascular ischemic disease. Vascular: Calcific atherosclerosis. Skull: No acute fracture. Sinuses/orbits: Visualized sinuses are clear.  Unremarkable orbits. Other: No mastoid effusions. Review of the MIP images confirms the above findings CTA NECK FINDINGS Aortic arch: Great vessel origins are patent. Mild atherosclerotic narrowing of the left subclavian artery origin Right carotid system: No evidence of dissection, stenosis (50% or greater) or occlusion. Mixed calcific and noncalcific atherosclerosis at the carotid bifurcation without greater than 50% narrowing. Left carotid system: Mild atherosclerotic narrowing of the common carotid artery origin. Predominantly noncalcific atherosclerosis at the carotid bifurcation with approximately 40% stenosis of the internal carotid artery origin. Vertebral arteries: Left  dominant. No evidence of significant (greater than 50%) stenosis. Skeleton: No acute findings. Mild multilevel so for coal degenerative change. Other neck: No mass or suspicious adenopathy Upper chest: Fibrotic changes in bilateral upper lungs and bronchial wall thickening. Review of the MIP images confirms the above findings CTA HEAD FINDINGS Anterior circulation: Predominately calcific atherosclerosis of bilateral cavernous and paraclinoid internal carotid  arteries without specific evidence of greater than 50% stenosis. Bilateral M1 and proximal M2 MCAs are patent without evidence of hemodynamically significant stenosis. Mild to moderate multifocal narrowing of more distal MCA branches, likely related to atherosclerosis. Bilateral A1 and A2 ACAs are patent without evidence of hemodynamically significant stenosis. No aneurysm identified. Posterior circulation: Diminutive right vertebral artery. Bilateral intradural vertebral arteries and basilar artery are patent without evidence of hemodynamically significant stenosis. Bilateral PCAs are patent. Mild right P1 PCA stenosis. Moderate to severe left proximal P2 PCA stenosis. Venous sinuses: As permitted by contrast timing, patent. Review of the MIP images confirms the above findings IMPRESSION: 1. No evidence of acute intracranial abnormality.Known acute/subacute infarcts in the right parieto-occipital lobe were better characterized on recent MRI. 2. No large vessel occlusion. 3. Moderate to severe proximal left P2 PCA stenosis. Mild right P1 PCA stenosis. 4. Bilateral carotid bifurcation atherosclerosis with approximately 40% narrowing of the proximal left internal carotid artery. Electronically Signed   By: Feliberto Harts MD   On: 10/02/2020 11:52   CT ANGIO NECK W OR WO CONTRAST  Result Date: 10/02/2020 CLINICAL DATA:  Stroke/TIA. EXAM: CT ANGIOGRAPHY HEAD AND NECK TECHNIQUE: Multidetector CT imaging of the head and neck was performed using the standard protocol during bolus administration of intravenous contrast. Multiplanar CT image reconstructions and MIPs were obtained to evaluate the vascular anatomy. Carotid stenosis measurements (when applicable) are obtained utilizing NASCET criteria, using the distal internal carotid diameter as the denominator. CONTRAST:  62mL OMNIPAQUE IOHEXOL 350 MG/ML SOLN COMPARISON:  MRI September 30, 2020. CT head September 29, 2020. FINDINGS: CT HEAD FINDINGS Brain: No evidence  of interval acute large vascular territory infarction, hemorrhage, hydrocephalus, extra-axial collection or mass lesion/mass effect. Known acute/subacute infarcts in the right parieto-occipital lobe were better characterized on recent MRI. Patchy white matter hypoattenuation, compatible with chronic microvascular ischemic disease. Vascular: Calcific atherosclerosis. Skull: No acute fracture. Sinuses/orbits: Visualized sinuses are clear.  Unremarkable orbits. Other: No mastoid effusions. Review of the MIP images confirms the above findings CTA NECK FINDINGS Aortic arch: Great vessel origins are patent. Mild atherosclerotic narrowing of the left subclavian artery origin Right carotid system: No evidence of dissection, stenosis (50% or greater) or occlusion. Mixed calcific and noncalcific atherosclerosis at the carotid bifurcation without greater than 50% narrowing. Left carotid system: Mild atherosclerotic narrowing of the common carotid artery origin. Predominantly noncalcific atherosclerosis at the carotid bifurcation with approximately 40% stenosis of the internal carotid artery origin. Vertebral arteries: Left dominant. No evidence of significant (greater than 50%) stenosis. Skeleton: No acute findings. Mild multilevel so for coal degenerative change. Other neck: No mass or suspicious adenopathy Upper chest: Fibrotic changes in bilateral upper lungs and bronchial wall thickening. Review of the MIP images confirms the above findings CTA HEAD FINDINGS Anterior circulation: Predominately calcific atherosclerosis of bilateral cavernous and paraclinoid internal carotid arteries without specific evidence of greater than 50% stenosis. Bilateral M1 and proximal M2 MCAs are patent without evidence of hemodynamically significant stenosis. Mild to moderate multifocal narrowing of more distal MCA branches, likely related to atherosclerosis. Bilateral A1 and A2 ACAs are patent without evidence of  hemodynamically significant  stenosis. No aneurysm identified. Posterior circulation: Diminutive right vertebral artery. Bilateral intradural vertebral arteries and basilar artery are patent without evidence of hemodynamically significant stenosis. Bilateral PCAs are patent. Mild right P1 PCA stenosis. Moderate to severe left proximal P2 PCA stenosis. Venous sinuses: As permitted by contrast timing, patent. Review of the MIP images confirms the above findings IMPRESSION: 1. No evidence of acute intracranial abnormality.Known acute/subacute infarcts in the right parieto-occipital lobe were better characterized on recent MRI. 2. No large vessel occlusion. 3. Moderate to severe proximal left P2 PCA stenosis. Mild right P1 PCA stenosis. 4. Bilateral carotid bifurcation atherosclerosis with approximately 40% narrowing of the proximal left internal carotid artery. Electronically Signed   By: Feliberto Harts MD   On: 10/02/2020 11:52    PHYSICAL EXAM  Temp:  [96.7 F (35.9 C)-97.7 F (36.5 C)] 97.7 F (36.5 C) (12/27 1347) Pulse Rate:  [58-72] 66 (12/27 1347) Resp:  [16-19] 19 (12/27 1347) BP: (111-145)/(53-73) 111/53 (12/27 1347) SpO2:  [94 %-100 %] 100 % (12/27 1347)  General - Well nourished, well developed, in no apparent distress.  Ophthalmologic - fundi not visualized due to noncooperation.  Cardiovascular - Regular rhythm and rate.  Mental Status -  Level of arousal and orientation to time, place, and person were intact. Language including expression, naming, comprehension was assessed and found intact. Able to repeat simple sentences but with some difficulty with complex sentences.   Cranial Nerves II - XII - II - Visual field intact OU. III, IV, VI - Extraocular movements intact. V - Facial sensation intact bilaterally. VII - Facial movement intact bilaterally. VIII - Hearing & vestibular intact bilaterally. X - Palate elevates symmetrically. XI - Chin turning & shoulder shrug intact bilaterally. XII - Tongue  protrusion intact.  Motor Strength - The patient's strength was normal in all extremities and pronator drift was absent except chronic right shoulder limited ROM, not able to raise up due to prior injury.  Bulk was normal and fasciculations were absent.   Motor Tone - Muscle tone was assessed at the neck and appendages and was normal.  Reflexes - The patient's reflexes were symmetrical in all extremities and she had no pathological reflexes.  Sensory - Light touch, temperature/pinprick were assessed and were symmetrical.    Coordination - The patient had normal movements in the hands with no ataxia or dysmetria.  Tremor was absent.  Gait and Station - deferred.   ASSESSMENT/PLAN Sheila Mccoy is a 86 y.o. female with history of COPD, Anxiety, Depression and hypothyroidism presenting to [facility] with 1h speech difficulties.   Stroke:  right parietooccipital 2 puncate infarcts at [facility] and juxtacortical area - small vessel disease vs. cardioembolic   Code Stroke CT head No acute abnormality. Age indeterminate L corona radiata infarct. Small vessel disease. Atrophy. ASPECTS 10.     MRI  1 juxtacortical and 1 subcortical R parietal lobe infarcts. Small vessel disease. Atrophy.   CTA head & neck no acute abnormality. No LVO. Moderate to severe proximal L P2 and mild R P1 stenoses. B ICA bifurcation atherosclerosis w/ 40% narrowing proximal L ICA. Carotid Doppler 1-39% bilateral ICA stenosis. Vertebral artery flow is antegrade.    2D Echo EF 60-65%. No source of embolus   Recommend 30 day cardiac event monitoring as outpt to rule out afib.  LDL 134  HgbA1c 5.2  VTE prophylaxis - Lovenox 30 mg sq daily   aspirin 81 mg daily prior to admission,  now on aspirin 81 mg daily and clopidogrel 75 mg daily. Continue DAPT x 3 weeks then plavix alone    Therapy recommendations:  HH PT & OT  Disposition:  Return to [facility] w/ HH therapies  Hypertension  Stable . Permissive  hypertension (OK if < 220/120) but gradually normalize in 5-7 days . 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 Please call with questions. Pt will follow up with stroke clinic NP at [facility] in about 4 weeks. Thanks for the consult.  Marvel Plan, MD PhD Stroke Neurology 10/02/2020 2:11 PM   To contact Stroke Continuity provider, please refer to [email or web address]. After hours, contact General Neurology

## 2020-10-02 NOTE — Plan of Care (Signed)
  Problem: Education: Goal: Knowledge of General Education information will improve Description Including pain rating scale, medication(s)/side effects and non-pharmacologic comfort measures Outcome: Progressing   

## 2020-10-02 NOTE — TOC Transition Note (Signed)
Transition of Care [facility]) - CM/SW Discharge Note   Patient Details  Name: Sheila Mccoy MRN: [id number] Date of Birth: April 03, 1931  Transition of Care [facility]) CM/SW Contact:  Lorri Frederick, LCSW Phone Number: 10/02/2020, 3:44 PM   Clinical Narrative:   Pt discharges back to [place] with PT/OT services provided on site by [employer] Daughter provides transportation.  No other needs identified.     Final next level of care: Home/Self Care Barriers to Discharge: No Barriers Identified   Patient Goals and CMS Choice Patient states their goals for this hospitalization and ongoing recovery are:: "get back to being healthy"      Discharge Placement                       Discharge Plan and Services     Post Acute Care Choice:  (Assisted living with supportive services)          DME Arranged: N/A         HH Arranged: NA HH Agency: NA        Social Determinants of Health (SDOH) Interventions     Readmission Risk Interventions No flowsheet data found.

## 2020-10-02 NOTE — Progress Notes (Signed)
Occupational Therapy Treatment Patient Details Name: Sheila Mccoy MRN: [id number] DOB: 03-17-1931 Today's Date: 10/02/2020    History of present illness 86 year old female with a history of UTI, thyroid disease, COPD on ASA who reports that today noted difficulty with speech.   OT comments  Pt progressing towards OT goals. Limited session due to pending CT but Pt min guard for transfer to [facility], able to perform peri care at min guard and UB dressing at mod I. Pt talking to daughter on phone with clear speech -no word finding difficulties this session, able to recall pending CT, able to recognize needs for bowel and bladder. Current plan of care appropriate.  Of note: Daughter reports that she was already getting OT/PT 5 days a week at [place], she has extra support and Pt/family want her to return there instead of SNF. There are pull chords everywhere and they feel much more comfortable for her returning to home environment. OT is in agreement with this.   Follow Up Recommendations  Home health OT    Equipment Recommendations  None recommended by OT    Recommendations for Other Services      Precautions / Restrictions Precautions Precautions: Fall Restrictions Weight Bearing Restrictions: No       Mobility Bed Mobility Overal bed mobility: Needs Assistance Bed Mobility: Supine to Sit;Sit to Supine     Supine to sit: Supervision Sit to supine: Supervision   General bed mobility comments: increased time required but no physical assist  Transfers Overall transfer level: Needs assistance Equipment used: 1 person hand held assist Transfers: Stand Pivot Transfers;Sit to/from Stand Sit to Stand: Min guard Stand pivot transfers: Min assist       General transfer comment: min A for transfer without DME, min guard for transfer with DME (person rollator in room)    Balance Overall balance assessment: Needs assistance Sitting-balance support: No upper extremity  supported;Feet supported Sitting balance-Leahy Scale: Good     Standing balance support: Bilateral upper extremity supported;During functional activity Standing balance-Leahy Scale: Fair Standing balance comment: reliant on UE support of Rollator                           ADL either performed or assessed with clinical judgement   ADL Overall ADL's : Needs assistance/impaired     Grooming: Supervision/safety       Lower Body Bathing: Set up;Sitting/lateral leans   Upper Body Dressing : Modified independent Upper Body Dressing Details (indicate cue type and reason): able to don new gown     Toilet Transfer: Min Pension scheme manager Details (indicate cue type and reason): SPT from [facility] to [facility] due to urgency Toileting- Clothing Manipulation and Hygiene: Min guard;Sit to/from stand Toileting - Clothing Manipulation Details (indicate cue type and reason): able to perform front and rear peri care     Functional mobility during ADLs: Min guard (4 wheeled walker)       Vision   Vision Assessment?: No apparent visual deficits   Perception     Praxis      Cognition Arousal/Alertness: Awake/alert Behavior During Therapy: WFL for tasks assessed/performed Overall Cognitive Status: No family/caregiver present to determine baseline cognitive functioning                                 General Comments: Pt able to converse with daughter on phone and recall upcoming  CT without difficulty, slowed processessing - able to communicate imminent needs for bowel and bladder - so awareness for basic needs good - Pt has assist with medicine management at [facility]        Exercises     Shoulder Instructions       General Comments Pt remained on 2L O2 via [place] throughout session with SpO2 95 and above    Pertinent Vitals/ Pain       Pain Assessment: No/denies pain  Home Living Family/patient expects to be discharged to:: Other (Comment) (independent  living facility)                             Home Equipment: Walker - 4 wheels          Prior Functioning/Environment Level of Independence: Independent with assistive device(s)        Comments: Ambulates with rollator. Can ambulate to dining room. Reports independence with ADLs. Does have assistance with showering and medication management.   Frequency  Min 2X/week        Progress Toward Goals  OT Goals(current goals can now be found in the care plan section)  Progress towards OT goals: Progressing toward goals  Acute Rehab OT Goals Patient Stated Goal: to get home instead of SNF OT Goal Formulation: With patient Time For Goal Achievement: 10/13/20 Potential to Achieve Goals: Good  Plan Discharge plan remains appropriate;Frequency remains appropriate    Co-evaluation                 AM-PAC OT "6 Clicks" Daily Activity     Outcome Measure   Help from another person eating meals?: None Help from another person taking care of personal grooming?: None Help from another person toileting, which includes using toliet, bedpan, or urinal?: A Little Help from another person bathing (including washing, rinsing, drying)?: A Little Help from another person to put on and taking off regular upper body clothing?: A Little Help from another person to put on and taking off regular lower body clothing?: A Lot 6 Click Score: 19    End of Session Equipment Utilized During Treatment: Rolling walker;Oxygen  OT Visit Diagnosis: Unsteadiness on feet (R26.81);Muscle weakness (generalized) (M62.81)   Activity Tolerance Patient tolerated treatment well   Patient Left in bed;Other (comment) (with transport going to CT)   Nurse Communication Mobility status;Other (comment) (going to CT)        Time: 0071-2197 OT Time Calculation (min): 33 min  Charges: OT General Charges $OT Visit: 1 Visit OT Treatments $Self Care/Home Management : 23-37 mins  Nyoka Cowden  OTR/L Acute Rehabilitation Services Pager: [phone] Office: [phone]   Evern Bio Tarah Buboltz 10/02/2020, 10:40 AM

## 2020-10-03 ENCOUNTER — Encounter: Payer: Self-pay | Admitting: *Deleted

## 2020-10-03 NOTE — Progress Notes (Signed)
Patient ID: Sheila Mccoy, female   DOB: [birth year], 86 y.o.   MRN: [id number] Patient enrolled for [employer] to ship a 30 day cardiac event monitor to her home.  Letter with instructions mailed to patient.

## 2020-10-13 ENCOUNTER — Ambulatory Visit (INDEPENDENT_AMBULATORY_CARE_PROVIDER_SITE_OTHER): Payer: Medicare HMO

## 2020-10-13 DIAGNOSIS — G459 Transient cerebral ischemic attack, unspecified: Secondary | ICD-10-CM | POA: Diagnosis not present

## 2020-10-13 DIAGNOSIS — I639 Cerebral infarction, unspecified: Secondary | ICD-10-CM | POA: Diagnosis not present

## 2020-10-13 DIAGNOSIS — I4891 Unspecified atrial fibrillation: Secondary | ICD-10-CM | POA: Diagnosis not present

## 2020-10-31 NOTE — Progress Notes (Signed)
[facility] [address] [place]. [place] [address] [phone]       HOSPITAL FOLLOW UP NOTE  [name] Date of Birth:  1931/07/17 Medical Record Number:  [id number]   Reason for Referral:  hospital stroke follow up    SUBJECTIVE:   CHIEF COMPLAINT:  Chief Complaint  Patient presents with  . Follow-up    RM 14 with daughter (feralin ) PT is well, no headaches/numbness    HPI:   [name] is a 87 y.o. female with history of COPD, Anxiety, Depression and hypothyroidism  who presented to [facility]  on 09/29/2020 with 1h speech difficulties.  She was eventually transferred to [facility] ED with hospital notes, lab work and imaging personally reviewed with summary provided.  Stroke work-up revealed right parieto-occipital 2 punctate infarcts at [facility] juxtacortical area secondary to small vessel disease vs cardioembolic.  CTA head/neck negative LVO with moderate to severe proximal left P2 and mild right P1 stenosis as well as bilateral ICA bifurcation arthrosclerosis and 40% narrowing proximal left ICA.  Recommended 30-day cardiac event monitoring outpatient to rule out atrial fibrillation.  On aspirin 81 mg PTA and recommended DAPT for 3 weeks and Plavix alone.  History of HTN stable.  LDL 134 and initiate atorvastatin 40 mg daily.  No prior history or evidence of DM with A1c 5.2.  Other stroke risk factors include prior strokes on imaging, advanced age and chronic b/l DVT 05/2020 (not treated).  Other active problems include COPD, hypothyroidism and anxiety/depression.  Evaluated by therapies who recommended [facility] PT/OT and discharged back to [place] [facility] w/ HH therapies.  Stroke:  right parietooccipital 2 puncate infarcts at [facility] and juxtacortical area - small vessel disease vs. cardioembolic   Code Stroke CT head No acute abnormality. Age indeterminate L corona radiata infarct. Small vessel disease. Atrophy. ASPECTS 10.     MRI  1 juxtacortical  and 1 subcortical R parietal lobe infarcts. Small vessel disease. Atrophy.   CTA head & neck no acute abnormality. No LVO. Moderate to severe proximal L P2 and mild R P1 stenoses. B ICA bifurcation atherosclerosis w/ 40% narrowing proximal L ICA.  Carotid Doppler 1-39% bilateral ICA stenosis. Vertebral artery flow is antegrade.    2D Echo EF 60-65%. No source of embolus   Recommend 30 day cardiac event monitoring as outpt to rule out afib.  LDL 134  HgbA1c 5.2  VTE prophylaxis - Lovenox 30 mg sq daily   aspirin 81 mg daily prior to admission, now on aspirin 81 mg daily and clopidogrel 75 mg daily. Continue DAPT x 3 weeks then plavix alone    Therapy recommendations:  HH PT & OT  Disposition:  Return to ALP w/ HH therapies  Today, 11/01/2020, [name] is being seen for hospital follow-up accompanied by her daughter.  She has been slowly recovering and currently working with PT/OT for gait impairment and imbalance.  She ambulates with rolling walker which she used PTA.  She will use wheelchair for long distance.  Also reports increased fatigue since her stroke.  She continues to live at [facility].  Denies new stroke/TIA symptoms.  She has remained on both aspirin and Plavix despite 3-week recommendation but denies bleeding or bruising.  Unfortunately, daughter reports side effects on atorvastatin consisting of severe BLE myalgias and weakness.  Atorvastatin discontinued less than 1 week ago and she has been slowly regaining strength.  Blood pressure today 111/67.  She is currently  wearing cardiac monitor.  No further concerns at this time.   ROS:   14 system review of systems performed and negative with exception of weakness, gait impairment and fatigue  PMH:  Past Medical History:  Diagnosis Date  . Anxiety   . COPD (chronic obstructive pulmonary disease) (HCC)   . Depression   . Thyroid disease     PSH:  Past Surgical History:  Procedure Laterality Date  . arm  fracture surgery      Social History:  Social History   Socioeconomic History  . Marital status: Divorced    Spouse name: Not on file  . Number of children: Not on file  . Years of education: Not on file  . Highest education level: Not on file  Occupational History  . Not on file  Tobacco Use  . Smoking status: Never Smoker  . Smokeless tobacco: Never Used  Vaping Use  . Vaping Use: Never used  Substance and Sexual Activity  . Alcohol use: Never  . Drug use: Never  . Sexual activity: Not on file  Other Topics Concern  . Not on file  Social History Narrative  . Not on file   Social Determinants of Health   Financial Resource Strain: Not on file  Food Insecurity: Not on file  Transportation Needs: Not on file  Physical Activity: Not on file  Stress: Not on file  Social Connections: Not on file  Intimate Partner Violence: Not on file    Family History:  Family History  Problem Relation Age of Onset  . Lung cancer Mother     Medications:   Current Outpatient Medications on File Prior to Visit  Medication Sig Dispense Refill  . albuterol (VENTOLIN HFA) 108 (90 Base) MCG/ACT inhaler Inhale 1-2 puffs into the lungs every 6 (six) hours as needed for wheezing or shortness of breath. 1 each 0  . aspirin (ASPIRIN 81) 81 MG chewable tablet Continue aspirin with Plavix for 21 days.  Then stop the aspirin and continue only Plavix indefinitely.    . budesonide-formoterol (SYMBICORT) 80-4.5 MCG/ACT inhaler Inhale 1 puff into the lungs 2 (two) times daily.    Marland Kitchen buPROPion (WELLBUTRIN) 75 MG tablet Take 75 mg by mouth daily.     . clopidogrel (PLAVIX) 75 MG tablet Take 1 tablet (75 mg total) by mouth daily. 30 tablet 2  . dorzolamide-timolol (COSOPT) 22.3-6.8 MG/ML ophthalmic solution Place 1 drop into the left eye 2 (two) times daily.    Marland Kitchen latanoprost (XALATAN) 0.005 % ophthalmic solution Place 1 drop into both eyes at bedtime.    Marland Kitchen levothyroxine (SYNTHROID) 75 MCG tablet Take 75  mcg by mouth daily before breakfast.    . mirtazapine (REMERON) 15 MG tablet Take 15 mg by mouth at bedtime.     . polyethylene glycol (MIRALAX / GLYCOLAX) 17 g packet Take 17 g by mouth daily as needed for moderate constipation. 14 each 0  . risperiDONE (RISPERDAL) 0.5 MG tablet Take 0.5 mg by mouth at bedtime.      No current facility-administered medications on file prior to visit.    Allergies:   Allergies  Allergen Reactions  . Hydromorphone     Very Disoriented & Confused  . Macrobid [Nitrofurantoin] Other (See Comments)    tired      OBJECTIVE:  Physical Exam  Vitals:   11/01/20 1108  BP: 111/67  Pulse: (!) 59  Height: 5\' 3"  (1.6 m)   Body mass index is 23.91 kg/m. No  exam data present  General: well developed, well nourished,  very pleasant elderly Caucasian female, seated, in no evident distress Head: head normocephalic and atraumatic.   Neck: supple with no carotid or supraclavicular bruits Cardiovascular: regular rate and rhythm, no murmurs Musculoskeletal: Limited right shoulder ROM chronic Skin:  no rash/petichiae Vascular:  Normal pulses all extremities   Neurologic Exam Mental Status: Awake and fully alert.   No evidence of dysarthria or aphasia.  Oriented to place and time. Recent and remote memory intact. Attention span, concentration and fund of knowledge appropriate. Mood and affect appropriate.  Cranial Nerves: Fundoscopic exam reveals sharp disc margins. Pupils equal, briskly reactive to light. Extraocular movements full without nystagmus. Visual fields left homonymous hemianopia -patient reports chronic from glaucoma? -Denies any recent visual changes.  HOH bilaterally. Facial sensation intact. Face, tongue, palate moves normally and symmetrically.  Motor: Normal bulk and tone. Normal strength in all tested extremity muscles Sensory.: intact to touch , pinprick , position and vibratory sensation.  Coordination: Rapid alternating movements normal in  all extremities. Finger-to-nose and heel-to-shin performed accurately bilaterally. Gait and Station: Arises from chair without difficulty. Stance is normal. Gait demonstrates  decreased stride length and step height with occasional shuffling of feet and use of rolling walker.  Tandem walk not attempted. Reflexes: 1+ and symmetric. Toes downgoing.     NIHSS  2 Modified Rankin  2      ASSESSMENT: Sheila Mccoy is a 87 y.o. year old female presented with 1 hr of speech difficulties on 09/29/2020 with stroke work-up revealing to right parieto-occipital infarcts at [facility] and juxtacortical area secondary to small vessel disease vs cardioembolic. Vascular risk factors include HTN, HLD, intracranial stenosis, advanced age and b/l chronic DVT.      PLAN:  1. R parieto-occipital strokes:  a. Residual deficit: Reported gait impairment and evidence of left homonymous hemianopia (?chronic or from recent stroke).  Encouraged continued participation with [facility] PT/OT for hopeful ongoing improvement.  Daughter plans on scheduling follow-up visit with established ophthalmology to further assess visual loss (as pt reports chronic issue with history of glaucoma and cataracts).   b. Complete 30-day cardiac event monitor to rule out atrial fibrillation as potential stroke etiolog c. Continue clopidogrel 75 mg daily  and trial Crestor 5 mg daily for secondary stroke prevention.  Advised to discontinue aspirin at this time as 3 weeks DAPT completed and no indication for prolonged duration.   d. Discussed secondary stroke prevention measures and importance of close PCP follow up for aggressive stroke risk factor management  2. HTN: BP goal <130/90.  Well-controlled monitored by PCP 3. HLD: LDL goal <70. Recent LDL 134 therefore initiated atorvastatin 40 mg daily during recent stroke admission but unfortunately unable to tolerate.  She agreed to try low-dose Crestor - new rx sent to pharmacy.  If intolerance to Crestor,  may consider trial of PCSK9 inhibitor for secondary stroke prevention   Follow up in 3 months or call earlier if needed   CC:  [facility] provider: Dr. Doreatha Lew, MD    I spent 45 minutes of face-to-face and non-face-to-face time with patient and daughter.  This included previsit chart review including recent hospitalization pertinent progress notes, lab work and imaging, lab review, study review, order entry, electronic health record documentation, patient education regarding recent stroke including etiology, residual deficits, importance of managing stroke risk factors and answered all other questions to patient and daughters satisfaction   Ihor Austin, AGNP-BC  [facility] [phone]  [address] [place], [place] [address]  Phone [phone] Fax [phone] Note: This document was prepared with digital dictation and possible smart phrase technology. Any transcriptional errors that result from this process are unintentional.

## 2020-11-01 ENCOUNTER — Encounter: Payer: Self-pay | Admitting: Adult Health

## 2020-11-01 ENCOUNTER — Ambulatory Visit: Payer: Medicare HMO | Admitting: Adult Health

## 2020-11-01 ENCOUNTER — Other Ambulatory Visit: Payer: Self-pay

## 2020-11-01 VITALS — BP 111/67 | HR 59 | Ht 63.0 in

## 2020-11-01 DIAGNOSIS — H53462 Homonymous bilateral field defects, left side: Secondary | ICD-10-CM | POA: Diagnosis not present

## 2020-11-01 DIAGNOSIS — I69398 Other sequelae of cerebral infarction: Secondary | ICD-10-CM | POA: Diagnosis not present

## 2020-11-01 DIAGNOSIS — R269 Unspecified abnormalities of gait and mobility: Secondary | ICD-10-CM | POA: Diagnosis not present

## 2020-11-01 DIAGNOSIS — I639 Cerebral infarction, unspecified: Secondary | ICD-10-CM | POA: Diagnosis not present

## 2020-11-01 MED ORDER — ROSUVASTATIN CALCIUM 5 MG PO TABS
5.0000 mg | ORAL_TABLET | Freq: Every day | ORAL | 3 refills | Status: DC
Start: 2020-11-01 — End: 2020-11-13

## 2020-11-01 NOTE — Patient Instructions (Signed)
Continue working with PT/OT for likely ongoing recovery  Continue clopidogrel 75 mg daily  and start Crestor 5mg  daily  for secondary stroke prevention  Discontinue aspirin at this time  Complete 30 day cardiac event monitor to rule out atrial fibrillation as potential stroke cause  Continue to follow up with PCP regarding cholesterol and blood pressure management  Maintain strict control of hypertension with blood pressure goal below 130/90 and cholesterol with LDL cholesterol (bad cholesterol) goal below 70 mg/dL.       Followup in the future with me in 3 months or call earlier if needed       Thank you for coming to see at [facility]. I hope we have been able to provide you high quality care today.  You may receive a patient satisfaction survey over the next few weeks. We would appreciate your feedback and comments so that we may continue to improve ourselves and the health of our patients.

## 2020-11-06 NOTE — Progress Notes (Signed)
I agree with the above plan 

## 2020-11-10 ENCOUNTER — Telehealth: Payer: Self-pay | Admitting: Adult Health

## 2020-11-10 NOTE — Telephone Encounter (Signed)
Pt's daughter,Sheila Mccoy (on DPR) called, a few days she started having a side effect to rosuvastatin (CRESTOR) 5 MG tablet. She is experiencing mental confusion, incontinence. She did take the medication last night. Would like to take off the medication tonight. Please advise.  Contact info: [phone]

## 2020-11-10 NOTE — Telephone Encounter (Signed)
I spoke with the daughter.  The cholesterol was just mildly elevated so she will stop the Crestor for now.  If the symptoms last more than a couple days they are probably unrelated.

## 2020-11-13 MED ORDER — EZETIMIBE 10 MG PO TABS
10.0000 mg | ORAL_TABLET | Freq: Every day | ORAL | 3 refills | Status: DC
Start: 2020-11-13 — End: 2020-11-15

## 2020-11-13 NOTE — Telephone Encounter (Signed)
Called pts daughter, Barnie Alderman.  Over the wekkend the pt has progressively improved and back to her normal today per daughter.  Has failed atorvastatin and rouvastatin due to SE.  Will let her know what is next plan of action for pt.  She appreciated call back.

## 2020-11-13 NOTE — Telephone Encounter (Signed)
When you are able, would you be able to call patient to see if symptoms resolved?  Thank you.

## 2020-11-13 NOTE — Telephone Encounter (Signed)
Intolerant to both Crestor and atorvastatin.  Recommend trialing Zetia 10 mg daily which is typically well-tolerated.  Otherwise, would recommend initiating PCSK9 inhibitor which is a twice monthly injection such as Repatha or Praluent.  Order placed for Zetia but if they would like to trial PCSK9 inhibitor, please let me know and order will be placed.

## 2020-11-13 NOTE — Telephone Encounter (Signed)
I called pts daughter and they are very hesitant about starting another medication due to SE of other.  I trie dto explain, but they had questions, please call daughter after 4p to discuss.

## 2020-11-15 ENCOUNTER — Other Ambulatory Visit: Payer: Self-pay | Admitting: Student

## 2020-11-15 DIAGNOSIS — G459 Transient cerebral ischemic attack, unspecified: Secondary | ICD-10-CM

## 2020-11-15 DIAGNOSIS — I4891 Unspecified atrial fibrillation: Secondary | ICD-10-CM

## 2020-11-15 DIAGNOSIS — I639 Cerebral infarction, unspecified: Secondary | ICD-10-CM

## 2020-11-15 NOTE — Telephone Encounter (Signed)
Returned phone call to patient's daughter, Barnie Alderman regarding further cholesterol management. Per daughter, patient has been slowly improving since discontinuing Crestor and while on the phone, she was ambulating to the dining room which she was not able to do recently.  Patient and daughter hesitant to try other medications at this time due to prior side effects and potential side effects on different medications which causes her to have setbacks as she is still recovering from her stroke. Daughter and patient in agreement that we will hold off on further cholesterol treatment options until further recovery which can be discussed at follow-up visit.  It would be recommended at that time to trial medication such as Zetia or injectable PCSK9 inhibitor but again, this can be discussed at follow-up visit.  Daughter appreciative of phone call and no questions at this time.

## 2020-11-15 NOTE — Addendum Note (Signed)
Addended by: Raliegh Ip on: 11/15/2020 05:15 PM   Modules accepted: Orders

## 2020-11-21 NOTE — Progress Notes (Signed)
Kindly inform the patient that cardiac monitoring study did not show evidence of any significant worrisome arrhythmias.

## 2020-11-22 NOTE — Progress Notes (Signed)
Kindly inform the patient that heart monitoring study did not show any evidence of worrisome arrhythmias.

## 2020-11-23 ENCOUNTER — Ambulatory Visit: Payer: Medicare HMO | Admitting: Student

## 2020-11-23 ENCOUNTER — Encounter: Payer: Self-pay | Admitting: Student

## 2020-11-23 ENCOUNTER — Other Ambulatory Visit: Payer: Self-pay

## 2020-11-23 ENCOUNTER — Telehealth: Payer: Self-pay | Admitting: *Deleted

## 2020-11-23 VITALS — BP 112/62 | HR 69 | Ht 63.0 in | Wt 140.8 lb

## 2020-11-23 DIAGNOSIS — Z86718 Personal history of other venous thrombosis and embolism: Secondary | ICD-10-CM

## 2020-11-23 DIAGNOSIS — I639 Cerebral infarction, unspecified: Secondary | ICD-10-CM

## 2020-11-23 NOTE — Patient Instructions (Signed)
Medication Instructions:  Your physician recommends that you continue on your current medications as directed. Please refer to the Current Medication list given to you today.  *If you need a refill on your cardiac medications before your next appointment, please call your pharmacy*   Lab Work: None If you have labs (blood work) drawn today and your tests are completely normal, you will receive your results only by: Marland Kitchen MyChart Message (if you have MyChart) OR . A paper copy in the mail If you have any lab test that is abnormal or we need to change your treatment, we will call you to review the results.   Follow-Up: At [facility], you and your health needs are our priority.  As part of our continuing mission to provide you with exceptional heart care, we have created designated Provider Care Teams.  These Care Teams include your primary Cardiologist (physician) and Advanced Practice Providers (APPs -  Physician Assistants and Nurse Practitioners) who all work together to provide you with the care you need, when you need it.  We recommend signing up for the patient portal called "MyChart".  Sign up information is provided on this After Visit Summary.  MyChart is used to connect with patients for Virtual Visits (Telemedicine).  Patients are able to view lab/test results, encounter notes, upcoming appointments, etc.  Non-urgent messages can be sent to your provider as well.   To learn more about what you can do with MyChart, go to [email or web address].    Follow up as needed

## 2020-11-23 NOTE — Progress Notes (Signed)
ELECTROPHYSIOLOGY CONSULT NOTE  Patient ID: Sheila Mccoy MRN: [id number], DOB/AGE: [birth year]   Date of Consult: 11/23/2020  Primary Physician: Margot Ables, MD Primary Cardiologist: No primary care provider on file.  Primary Electrophysiologist: New (Dr. Ladona Ridgel read event monitor) Reason for Consultation: Cryptogenic stroke; recommendations regarding Implantable Loop Recorder Insurance: [employer]  History of Present Illness EP has been asked to evaluate Sheila Mccoy for placement of an implantable loop recorder to monitor for atrial fibrillation by Dr Pearlean Brownie.  The patient was admitted on 09/2020 with 1 hour of transient speech difficulties.    Imaging demonstrated right parietooccipital 2 puncate infarcts at [facility] and juxtacortical area - small vessel disease vs. cardioembolic .    She has undergone workup for stroke:   Code Stroke CT head No acute abnormality. Age indeterminate L corona radiata infarct. Small vessel disease. Atrophy. ASPECTS 10.     MRI  1 juxtacortical and 1 subcortical R parietal lobe infarcts. Small vessel disease. Atrophy.   CTA head & neck no acute abnormality. No LVO. Moderate to severe proximal L P2 and mild R P1 stenoses. B ICA bifurcation atherosclerosis w/ 40% narrowing proximal L ICA.  Carotid Doppler 1-39% bilateral ICA stenosis. Vertebral artery flow is antegrade.    2D Echo EF 60-65%. No source of embolus   DVT US 06/2020 in the setting of RLE swelling showed possible small amount of residual adherent chronic thrombus along the distal femoral vein wall without acute DVT or occlusion.   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 No prolonged pauses or sustained atrial/ventricular arryhtmias.   The patient has been monitored on telemetry with no reports of arrhythmias. Stroke work-up did not require a TEE per Neurology.     Echocardiogram as above. Lab work is reviewed.  She is overall doing well. She denies chest pain, shortness of breath, dizziness, palpitations, or syncope. She has not had any further stroke like symptoms.   Past Medical History:  Diagnosis Date  . Acute metabolic encephalopathy 07/09/2020  . Adnexal cyst 07/09/2020  . Anxiety   . COPD (chronic obstructive pulmonary disease) (HCC)   . Depression   . Hypothyroidism 09/30/2020  . Sacral fracture (HCC) 07/09/2020  . Thyroid disease   . TIA (transient ischemic attack) 09/29/2020  . UTI (urinary tract infection) 07/09/2020     Surgical History:  Past Surgical History:  Procedure Laterality Date  . arm fracture surgery       (Not in a hospital admission)   Inpatient Medications:   Allergies:  Allergies  Allergen Reactions  . Atorvastatin   . Crestor [Rosuvastatin]     Lower extremity weakness, gait instability and confusion  . Hydromorphone     Very Disoriented & Confused  . Macrobid [Nitrofurantoin] Other (See Comments)    tired    Social History   Socioeconomic History  . Marital status: Divorced    Spouse name: Not on file  . Number of children: Not on file  . Years of education: Not on file  . Highest education level: Not on file  Occupational History  . Not on file  Tobacco Use  . Smoking status: Never Smoker  . Smokeless tobacco: Never Used  Vaping Use  . Vaping Use: Never used  Substance and Sexual Activity  . Alcohol use: Never  . Drug use: Never  . Sexual activity: Not on  file  Other Topics Concern  . Not on file  Social History Narrative  . Not on file   Social Determinants of Health   Financial Resource Strain: Not on file  Food Insecurity: Not on file  Transportation Needs: Not on file  Physical Activity: Not on file  Stress: Not on file  Social Connections: Not on file  Intimate Partner Violence: Not on file     Family History  Problem Relation Age of Onset  . Lung cancer Mother        Review of Systems: All other systems reviewed and are otherwise negative except as noted above.  Physical Exam: Vitals:   11/23/20 1318  BP: 112/62  Pulse: 69  SpO2: 93%  Weight: 140 lb 12.8 oz (63.9 kg)  Height: 5\' 3"  (1.6 m)    GEN- The patient is well appearing, alert and oriented x 3 today.   Head- normocephalic, atraumatic Eyes-  Sclera clear, conjunctiva pink Ears- hearing intact Oropharynx- clear Neck- supple Lungs- Clear to ausculation bilaterally, normal work of breathing Heart- Regular rate and rhythm, no murmurs, rubs or gallops  GI- soft, NT, ND, + BS Extremities- no clubbing, cyanosis, or edema MS- no significant deformity or atrophy Skin- no rash or lesion Psych- euthymic mood, full affect   Labs:   Lab Results  Component Value Date   WBC 9.3 10/02/2020   HGB 13.9 10/02/2020   HCT 40.9 10/02/2020   MCV 91.5 10/02/2020   PLT 395 10/02/2020   No results for input(s): NA, K, CL, CO2, BUN, CREATININE, CALCIUM, PROT, BILITOT, ALKPHOS, ALT, AST, GLUCOSE in the last 168 hours.  Invalid input(s): LABALBU   Radiology/Studies: CARDIAC EVENT MONITOR  Result Date: 11/16/2020 1. NSR with sinus brady and sinus tachy 2. Frequent PVC's at times in a bigeminal pattern. 3. No prolonged pauses and no sustained atrial or ventricular arrhythmias. 01/14/2021 Taylor,MD   12-lead ECG 09/29/2020 showed NSR (personally reviewed) All prior EKG's in EPIC reviewed with no documented atrial fibrillation (NSR and sinus brady noted)  Telemetry No reports of arrhythmias on tele per admission notes. (personally reviewed)  Assessment and Plan:  1. Cryptogenic stroke The patient presented with cryptogenic stroke/TIA. She is managed by Dr. 10/01/2020 and his team. Event monitor was placed and did not show any atrial arrhythmias.  Pt and daughter wanted to know if her particular stroke was more or less likely related to her heart. I will defer to Dr. Pearlean Brownie given h/o DVT.  She presented  today to discuss loop recorder consideration. She is not interested at this time and would like to continue watchful waiting. She will call back our office if she changes her mind. Daughter present today who agreed with this plan as well.   2. H/o DVT DVT Pearlean Brownie 05/2020 showed chronic DVT involving LEFT femoral vein and RIGHT femoral and popliteal vein DVT 06/2020 06/2020 showed "small amount of residual adherent chronic thrombus along the distal femoral vein wall. No acute DVT or occlusion." MAR shows she was treated with short course of Eliquis If she has recurrent issues from this would then be indicated for [facility].   [facility], PA-C 11/23/2020 1:41 PM

## 2020-11-23 NOTE — Telephone Encounter (Signed)
Spoke with daughter Barnie Alderman (on [place]) and advised her that pt's cardiac monitoring study did not show any evidence of worrisome arrhythmias. She verbalized understanding and her questions were answered.    Micki Riley, MD  11/22/2020 12:41 PM EST Back to Top     Kindly inform the patient that heart monitoring study did not show any evidence of worrisome arrhythmias.

## 2020-12-28 ENCOUNTER — Ambulatory Visit (INDEPENDENT_AMBULATORY_CARE_PROVIDER_SITE_OTHER): Payer: Medicare HMO | Admitting: Family

## 2020-12-28 ENCOUNTER — Encounter: Payer: Self-pay | Admitting: Family

## 2020-12-28 ENCOUNTER — Other Ambulatory Visit: Payer: Self-pay

## 2020-12-28 VITALS — BP 120/88 | HR 73 | Temp 97.1°F | Resp 16 | Ht 63.0 in | Wt 143.2 lb

## 2020-12-28 DIAGNOSIS — G459 Transient cerebral ischemic attack, unspecified: Secondary | ICD-10-CM

## 2020-12-28 DIAGNOSIS — R2681 Unsteadiness on feet: Secondary | ICD-10-CM | POA: Diagnosis not present

## 2020-12-28 DIAGNOSIS — E039 Hypothyroidism, unspecified: Secondary | ICD-10-CM | POA: Diagnosis not present

## 2020-12-28 DIAGNOSIS — F319 Bipolar disorder, unspecified: Secondary | ICD-10-CM

## 2020-12-28 DIAGNOSIS — J449 Chronic obstructive pulmonary disease, unspecified: Secondary | ICD-10-CM

## 2020-12-28 DIAGNOSIS — M545 Low back pain, unspecified: Secondary | ICD-10-CM

## 2020-12-28 DIAGNOSIS — R413 Other amnesia: Secondary | ICD-10-CM

## 2020-12-28 DIAGNOSIS — R29898 Other symptoms and signs involving the musculoskeletal system: Secondary | ICD-10-CM

## 2020-12-28 DIAGNOSIS — G8929 Other chronic pain: Secondary | ICD-10-CM

## 2020-12-28 MED ORDER — CLOPIDOGREL BISULFATE 75 MG PO TABS: 75.0000 mg | ORAL_TABLET | Freq: Every day | ORAL | 1 refills | Status: AC

## 2020-12-28 NOTE — Progress Notes (Signed)
Provider: Richarda Blade FNP-C   Kaylena Pacifico, Donalee Citrin, NP  Patient Care Team: Timber Lucarelli, Donalee Citrin, NP as PCP - General (Family Medicine) Ihor Austin, NP as Nurse Practitioner (Neurology) Avie Echevaria, MD as Referring Physician (Pulmonary Disease) Elder Love, DO as Referring Physician [facility])  Extended Emergency Contact Information Primary Emergency Contact: Gaylan Gerold Mobile Phone: [phone] Relation: Daughter Secondary Emergency Contact: SOURDIFFE,FERRILYN Home Phone: [phone] Relation: Daughter  Code Status:  Full Code  Goals of care: Advanced Directive information Advanced Directives 12/28/2020  Does Patient Have a Medical Advance Directive? Yes  Type of Advance Directive Living will;Out of facility DNR (pink MOST or yellow form)  Does patient want to make changes to medical advance directive? No - Patient declined  Would patient like information on creating a medical advance directive? -     Chief Complaint  Patient presents with  . Establish Care    New Patient.    HPI:  Pt is a 87 y.o. female seen today to establish care for medical management of chronic diseases.she is here with her daughter.she recently relocate to area and lives in Independent Living at [facility].she denies any acute issues today.she has a medical history of COPD on continuous oxygen 2 liters via nasal cannula,Bipolar,major depression,Hypothryroidism,TIA ,lower back pain,Hx of falls,Unsteady gait walks with a Rolator, Memory loss among others. Hx of TIA - request Plavix refill   Working with [facility] for weakness and unsteady gait.no recent fall episode .Also has Home health Aid who assist with her ADL's. Follows up with Pulmonologist for COPD request a referral for in town pulmonologist.previously following up with Dr.Simanta Dtta at [facility] last seen 11/07/2020.    Also request to in network refer to Neurologist for hx of stroke and  evaluation of bilateral leg weakness PT concern for possible parkinson disease.    Past Medical History:  Diagnosis Date  . Acute metabolic encephalopathy 07/09/2020  . Adnexal cyst 07/09/2020  . Anxiety   . Bipolar disorder (HCC)   . COPD (chronic obstructive pulmonary disease) (HCC)   . COPD (chronic obstructive pulmonary disease) (HCC)   . Depression   . History of CT scan 2021   Legs.  . Hypothyroidism 09/30/2020  . Low thyroid stimulating hormone (TSH) level   . Memory difficulty 01/02/2021  . Sacral fracture (HCC) 07/09/2020  . Thyroid disease   . TIA (transient ischemic attack) 09/29/2020  . UTI (urinary tract infection) 07/09/2020   Past Surgical History:  Procedure Laterality Date  . arm fracture surgery    . MRI  2021   Lungs.  . right shoulder      pins in right shoulder.    Allergies  Allergen Reactions  . Crestor [Rosuvastatin]     Lower extremity weakness, gait instability and confusion  . Hydromorphone     Very Disoriented & Confused  . Lipitor [Atorvastatin]     Muscle paralysis.  Earma Reading [Nitrofurantoin] Other (See Comments)    tired  . Morphine And Related     Allergies as of 12/28/2020      Reactions   Crestor [rosuvastatin]    Lower extremity weakness, gait instability and confusion   Hydromorphone    Very Disoriented & Confused   Lipitor [atorvastatin]    Muscle paralysis.   Macrobid [nitrofurantoin] Other (See Comments)   tired   Morphine And Related       Medication List       Accurate as of December 28, 2020 11:59  PM. If you have any questions, ask your nurse or doctor.        STOP taking these medications   polyethylene glycol 17 g packet Commonly known as: MIRALAX / GLYCOLAX Stopped by: Caesar Bookman, NP     TAKE these medications   albuterol 108 (90 Base) MCG/ACT inhaler Commonly known as: VENTOLIN HFA Inhale 1-2 puffs into the lungs every 6 (six) hours as needed for wheezing or shortness of breath.    budesonide-formoterol 80-4.5 MCG/ACT inhaler Commonly known as: SYMBICORT Inhale 1 puff into the lungs 2 (two) times daily.   buPROPion 75 MG tablet Commonly known as: WELLBUTRIN Take 75 mg by mouth daily.   CALCIUM MAGNESIUM PO Take 1 capsule by mouth 2 (two) times daily as needed.   clopidogrel 75 MG tablet Commonly known as: PLAVIX Take 1 tablet (75 mg total) by mouth daily.   CO Q 10 PO Take 120 mg by mouth 2 (two) times daily as needed.   CRANBERRY PO Take 200 mg by mouth 2 (two) times daily as needed.   dorzolamide-timolol 22.3-6.8 MG/ML ophthalmic solution Commonly known as: COSOPT Place 1 drop into the left eye 2 (two) times daily.   furosemide 20 MG tablet Commonly known as: LASIX Take 20 mg by mouth. Monday and Thursday.   latanoprost 0.005 % ophthalmic solution Commonly known as: XALATAN Place 1 drop into the left eye at bedtime. What changed: Another medication with the same name was removed. Continue taking this medication, and follow the directions you see here. Changed by: Caesar Bookman, NP   Levothyroxine Sodium 137 MCG Caps Take 68.5 mcg by mouth daily before breakfast. 1/2 tablet on empty stomach 1 hour before a meal with glass of water. What changed: Another medication with the same name was removed. Continue taking this medication, and follow the directions you see here. Changed by: Caesar Bookman, NP   mirtazapine 15 MG tablet Commonly known as: REMERON Take 15 mg by mouth at bedtime.   OMEGA 3 PO Take 1,300 mg by mouth daily.   OVER THE COUNTER MEDICATION Take 1 drop by mouth daily. Vitamin B12   pantoprazole 40 MG tablet Commonly known as: PROTONIX Take 40 mg by mouth daily.   risperiDONE 0.5 MG tablet Commonly known as: RISPERDAL Take 0.5 mg by mouth at bedtime.   TYLENOL PO Take by mouth.   VITAMIN D3 PO Take 1,000 mg by mouth 2 (two) times daily as needed.   VITAMIN E PO Take 300 mg by mouth 2 (two) times daily as  needed.       Review of Systems  Constitutional: Negative for appetite change, chills, fatigue and fever.  HENT: Positive for dental problem, hearing loss and rhinorrhea. Negative for congestion, ear discharge, postnasal drip, sinus pressure, sinus pain, sneezing, sore throat and trouble swallowing.        Wears dentures  On claritn for seasonal allergies   Eyes: Positive for visual disturbance. Negative for discharge, redness and itching.       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        COPD  Cardiovascular: Positive for leg swelling. Negative for chest pain and palpitations.  Gastrointestinal: Negative for abdominal distention, abdominal pain, blood in stool, constipation, diarrhea, nausea and vomiting.  Endocrine: Positive for cold intolerance. Negative for heat intolerance, polydipsia, polyphagia and polyuria.  Genitourinary: Negative for difficulty  urinating, dysuria, flank pain, frequency, urgency, vaginal bleeding and vaginal discharge.  Musculoskeletal: Positive for back pain and gait problem. Negative for arthralgias, joint swelling, myalgias and neck pain.       Back pain under control  Skin: Negative for color change, pallor and rash.  Neurological: Positive for weakness. Negative for dizziness, tremors, speech difficulty, light-headedness and headaches.       Hx of TIA   Hematological: Does not bruise/bleed easily.  Psychiatric/Behavioral: Negative for agitation, confusion and sleep disturbance. The patient is not nervous/anxious.     Immunization History  Administered Date(s) Administered  . Influenza, High Dose Seasonal PF 08/14/2020   Pertinent  Health Maintenance Due  Topic Date Due  . DEXA SCAN  Never done  . PNA vac Low Risk Adult (1 of 2 - PCV13) Never done  . INFLUENZA VACCINE  Completed   Fall Risk  01/02/2021 12/28/2020  Falls in the past year? 0 0   Number falls in past yr: - 0  Injury with Fall? - 0   Functional Status Survey:    Vitals:   12/28/20 0904  BP: 120/88  Pulse: 73  Resp: 16  Temp: (!) 97.1 F (36.2 C)  SpO2: 93%  Weight: 143 lb 3.2 oz (65 kg)  Height: 5\' 3"  (1.6 m)   Body mass index is 25.37 kg/m. Physical Exam Vitals reviewed.  Constitutional:      General: She is not in acute distress.    Appearance: She is overweight. She is not ill-appearing.  HENT:     Head: Normocephalic.     Right Ear: Tympanic membrane, ear canal and external ear normal. There is no impacted cerumen.     Left Ear: Tympanic membrane, ear canal and external ear normal. There is no impacted cerumen.     Nose: Nose normal. No congestion or rhinorrhea.     Mouth/Throat:     Mouth: Mucous membranes are moist.     Pharynx: Oropharynx is clear. No oropharyngeal exudate or posterior oropharyngeal erythema.  Eyes:     General: No scleral icterus.       Right eye: No discharge.        Left eye: No discharge.     Extraocular Movements: Extraocular movements intact.     Conjunctiva/sclera: Conjunctivae normal.     Pupils: Pupils are equal, round, and reactive to light.  Neck:     Vascular: No carotid bruit.  Cardiovascular:     Rate and Rhythm: Normal rate and regular rhythm.     Pulses: Normal pulses.     Heart sounds: Normal heart sounds. No murmur heard. No friction rub. No gallop.   Pulmonary:     Effort: Pulmonary effort is normal. No respiratory distress.     Breath sounds: Normal breath sounds. No wheezing, rhonchi or rales.     Comments: On oxygen 2 L via nasal cannula during visit  Chest:     Chest wall: No tenderness.  Abdominal:     General: Bowel sounds are normal. There is no distension.     Palpations: Abdomen is soft. There is no mass.     Tenderness: There is no abdominal tenderness. There is no right CVA tenderness, left CVA tenderness, guarding or rebound.  Musculoskeletal:        General: No swelling or  tenderness.     Right shoulder: No swelling, deformity, effusion, tenderness or crepitus. Decreased range of motion. Normal strength. Normal pulse.     Left shoulder: Normal.  Cervical back: Normal range of motion. No rigidity or tenderness.     Right lower leg: Edema present.     Left lower leg: Edema present.     Comments: Unsteady gait ambulates with Rolator.  Lymphadenopathy:     Cervical: No cervical adenopathy.  Skin:    General: Skin is warm and dry.     Coloration: Skin is not pale.     Findings: No bruising or erythema.  Neurological:     Mental Status: She is alert and oriented to person, place, and time.     Cranial Nerves: No cranial nerve deficit.     Sensory: No sensory deficit.     Coordination: Coordination normal.     Gait: Gait abnormal.     Comments: Bilateral leg weakness   Psychiatric:        Mood and Affect: Mood normal.        Speech: Speech normal.        Behavior: Behavior normal.        Thought Content: Thought content normal.        Judgment: Judgment normal.     Labs reviewed: Recent Labs    07/13/20 0805 09/29/20 1644 10/02/20 0108  NA 137 131* 134*  K 4.2 3.9 4.8  CL 104 95* 97*  CO2 22 25 26   GLUCOSE 104* 95 102*  BUN 37* 13 24*  CREATININE 0.82 0.85 1.53*  CALCIUM 9.3 9.9 9.9   Recent Labs    07/11/20 0722 07/12/20 0332 09/29/20 1644  AST 19 16 28   ALT 15 14 28   ALKPHOS 144* 124 131*  BILITOT 0.8 1.0 0.4  PROT 6.4* 6.4* 8.0  ALBUMIN 3.2* 3.2* 4.3   Recent Labs    07/11/20 0722 07/12/20 0332 09/29/20 1644 10/01/20 1033 10/02/20 0108  WBC 7.0 9.5 9.5 10.9* 9.3  NEUTROABS 4.3 5.7 5.2  --   --   HGB 13.4 13.5 13.5 13.4 13.9  HCT 42.2 42.3 39.9 40.8 40.9  MCV 95.0 92.8 93.4 93.2 91.5  PLT 295 365 373 416* 395   Lab Results  Component Value Date   TSH 1.184 10/01/2020   Lab Results  Component Value Date   HGBA1C 5.2 09/30/2020   Lab Results  Component Value Date   CHOL 208 (H) 09/30/2020   HDL 58 09/30/2020    LDLCALC 134 (H) 09/30/2020   TRIG 81 09/30/2020   CHOLHDL 3.6 09/30/2020    Significant Diagnostic Results in last 30 days:  No results found.  Assessment/Plan  1. Chronic obstructive pulmonary disease, unspecified COPD type (HCC) Afebrile. Breathing stable on oxygen 2 liter [place] daughter increases oxygen to 3 liters while patient is walking.  On Lasix for pleural effusion  - Ambulatory referral to Pulmonology to establish care with in network provider.   2. Acquired hypothyroidism Lab Results  Component Value Date   TSH 1.184 10/01/2020  Continue on levothyroxine 68.5 mcg tablet daily on an empty stomach.   3. Hx of TIA (transient ischemic attack) referral to Neurologist to establish care for in network.  - clopidogrel (PLAVIX) 75 MG tablet; Take 1 tablet (75 mg total) by mouth daily.  Dispense: 90 tablet; Refill: 1  4. Unsteady gait - continue with PT/OT for gait stability.Physical therapy concerned for parkinsonism due to leg weakness.  - Ambulatory referral to Neurology  5. Weakness of both lower extremities PT concern for parkinson disease as above.request referral to Neurology. Continue with Therapy  - Ambulatory referral to Neurology  6. Bipolar 1 disorder (HCC) Mood stable. Continue on Risperidone,Remeron,Alprazolam and Wellbutrin   7. Chronic bilateral low back pain without sciatica Continue on Tylenol as needed   8.Memory loss  Per daughter Has had difficulty finding words and forgetful.Will refer to Neurologist as above.MMSE on next visit.   Family/ staff Communication: Reviewed plan of care with patient and daughter verbalized understanding  Labs/tests ordered: None   Next Appointment : 4 months for medical management of chronic issues.   Caesar Bookman, NP

## 2020-12-28 NOTE — Patient Instructions (Signed)
Referral to specialist order today specialist office will call you for appointment

## 2021-01-02 ENCOUNTER — Encounter: Payer: Self-pay | Admitting: Neurology

## 2021-01-02 ENCOUNTER — Ambulatory Visit: Payer: Medicare HMO | Admitting: Neurology

## 2021-01-02 VITALS — BP 122/70 | HR 60 | Ht 63.0 in | Wt 144.0 lb

## 2021-01-02 DIAGNOSIS — R413 Other amnesia: Secondary | ICD-10-CM

## 2021-01-02 DIAGNOSIS — R531 Weakness: Secondary | ICD-10-CM

## 2021-01-02 HISTORY — DX: Other amnesia: R41.3

## 2021-01-02 MED ORDER — DONEPEZIL HCL 5 MG PO TABS
5.0000 mg | ORAL_TABLET | Freq: Every day | ORAL | 1 refills | Status: DC
Start: 2021-01-02 — End: 2021-02-09

## 2021-01-02 MED ORDER — ALPRAZOLAM 0.5 MG PO TABS: ORAL_TABLET | ORAL | 0 refills | Status: DC

## 2021-01-02 NOTE — Patient Instructions (Signed)
We will start aricept for the memory.  Begin Aricept (donepezil) at 5 mg at night for one month. If this medication is well-tolerated, please call our office and we will call in a prescription for the 10 mg tablets. Look out for side effects that may include nausea, diarrhea, weight loss, or stomach cramps. This medication will also cause a runny nose, therefore there is no need for allergy medications for this purpose.  

## 2021-01-02 NOTE — Progress Notes (Signed)
Reason for visit: Gait disorder, memory disorder  Referring physician: Dr. Joseph Berkshire Sheila Mccoy is a 87 y.o. female  History of present illness:  Sheila Mccoy is an 87 year old right-handed white female with a history of a recent stroke affecting Sheila right parietal and occipital area on 01 December 2019.  Sheila Mccoy underwent a full stroke work-up for this.  A 30-day cardiac monitor was done did not show any evidence of cardiac rhythm abnormalities.  She comes in for a new problem today.  She comes in with her daughter who confirms that she has had problems with memory for at least a year.  Sheila Mccoy has developed some word finding issues and short-term memory problems.  She has recently moved to this area and is at [place] in an independent living situation.  She has home health coming in for assistance twice a day.  She needs help keeping up with medications and appointments and with Sheila finances.  She needs some assistance with bathing and dressing as well.  She has not had any agitation or hallucinations.  She does take Risperdal because of a bipolar disorder.  Sheila Mccoy does report some low back pain, she has had some troubles with walking for least a year and a half, she has been using a walker for a year.  She has not had any recent falls.  She denies any dizziness with walking.  If she stands too long she gets "bouncy" in Sheila legs, she feels as if Sheila legs get weak.  She does report some trouble with urinary incontinence over Sheila last year and a half.  She denies any numbness of Sheila extremities.  She has had blood work that reveals a good vitamin B12 level.  Over Sheila last 4 to 6 months she has had to go on oxygen with a history of COPD but she has never been a smoker.  She is sent to this office for further evaluation.  Prior MRI of Sheila brain has shown a moderate level of small vessel ischemic changes.  Past Medical History:  Diagnosis Date  . Acute metabolic encephalopathy  07/09/2020  . Adnexal cyst 07/09/2020  . Anxiety   . Bipolar disorder (HCC)   . COPD (chronic obstructive pulmonary disease) (HCC)   . COPD (chronic obstructive pulmonary disease) (HCC)   . Depression   . History of CT scan 2021   Legs.  . Hypothyroidism 09/30/2020  . Low thyroid stimulating hormone (TSH) level   . Sacral fracture (HCC) 07/09/2020  . Thyroid disease   . TIA (transient ischemic attack) 09/29/2020  . UTI (urinary tract infection) 07/09/2020    Past Surgical History:  Procedure Laterality Date  . arm fracture surgery    . MRI  2021   Lungs.  . right shoulder      pins in right shoulder.    Family History  Problem Relation Age of Onset  . Lung cancer Mother   . Cancer Sister   . Cancer Brother   . Cancer Sister   . Sarcoidosis Brother   . Leukemia Daughter     Social history:  reports that she has never smoked. She has never used smokeless tobacco. She reports that she does not drink alcohol and does not use drugs.  Medications:  Prior to Admission medications   Medication Sig Start Date End Date Taking? Authorizing Provider  Acetaminophen (TYLENOL PO) Take by mouth.   Yes [provider]  albuterol (VENTOLIN HFA) 108 (  90 Base) MCG/ACT inhaler Inhale 1-2 puffs into Sheila lungs every 6 (six) hours as needed for wheezing or shortness of breath. 06/08/20  Yes Bast, Traci A, NP  budesonide-formoterol (SYMBICORT) 80-4.5 MCG/ACT inhaler Inhale 1 puff into Sheila lungs 2 (two) times daily.   Yes [provider]  buPROPion (WELLBUTRIN) 75 MG tablet Take 75 mg by mouth daily.  06/13/20  Yes [provider]  Calcium-Magnesium-Vitamin D (CALCIUM MAGNESIUM PO) Take 1 capsule by mouth 2 (two) times daily as needed.   Yes [provider]  Cholecalciferol (VITAMIN D3 PO) Take 1,000 mg by mouth 2 (two) times daily as needed.   Yes [provider]  clopidogrel (PLAVIX) 75 MG tablet Take 1 tablet (75 mg total) by mouth daily. 12/28/20  Yes  Ngetich, Dinah C, NP  Coenzyme Q10 (CO Q 10 PO) Take 120 mg by mouth 2 (two) times daily as needed.   Yes [provider]  CRANBERRY PO Take 200 mg by mouth 2 (two) times daily as needed.   Yes [provider]  dorzolamide-timolol (COSOPT) 22.3-6.8 MG/ML ophthalmic solution Place 1 drop into Sheila left eye 2 (two) times daily.   Yes [provider]  furosemide (LASIX) 20 MG tablet Take 20 mg by mouth. Monday and Thursday.   Yes [provider]  latanoprost (XALATAN) 0.005 % ophthalmic solution Place 1 drop into Sheila left eye at bedtime.   Yes [provider]  Levothyroxine Sodium 137 MCG CAPS Take 68.5 mcg by mouth daily before breakfast. 1/2 tablet on empty stomach 1 hour before a meal with glass of water.   Yes [provider]  mirtazapine (REMERON) 15 MG tablet Take 15 mg by mouth at bedtime.  06/13/20  Yes [provider]  Omega-3 Fatty Acids (OMEGA 3 PO) Take 1,300 mg by mouth daily.   Yes [provider]  OVER Sheila COUNTER MEDICATION Take 1 drop by mouth daily. Vitamin B12   Yes [provider]  pantoprazole (PROTONIX) 40 MG tablet Take 40 mg by mouth daily. 11/01/20  Yes [provider]  risperiDONE (RISPERDAL) 0.5 MG tablet Take 0.5 mg by mouth at bedtime.  05/15/20  Yes [provider]  VITAMIN E PO Take 300 mg by mouth 2 (two) times daily as needed.   Yes [provider]      Allergies  Allergen Reactions  . Crestor [Rosuvastatin]     Lower extremity weakness, gait instability and confusion  . Hydromorphone     Very Disoriented & Confused  . Lipitor [Atorvastatin]     Muscle paralysis.  Earma Reading [Nitrofurantoin] Other (See Comments)    tired  . Morphine And Related     ROS:  Out of a complete 14 system review of symptoms, Sheila Mccoy complains only of Sheila following symptoms, and all other reviewed systems are negative.  Weakness Walking difficulty Memory problems  Blood  pressure 122/70, pulse 60, height 5\' 3"  (1.6 m), weight 144 lb (65.3 kg).   Blood pressure, standing, left arm is 122/72.  Physical Exam  General: Sheila Mccoy is alert and cooperative at Sheila time of Sheila examination.  Eyes: Pupils are equal, round, and reactive to light. Discs are flat bilaterally.  Neck: Sheila neck is supple, no carotid bruits are noted on Sheila right, there is a carotid bruit on Sheila left.  Respiratory: Sheila respiratory examination is clear.  Cardiovascular: Sheila cardiovascular examination reveals a regular rate and rhythm, no obvious murmurs or rubs are noted.  Skin: Extremities are with 1-2+ edema below Sheila knees bilaterally.  Neurologic Exam  Mental status: Sheila Mccoy is alert and oriented x 3 at Sheila time of Sheila examination. Sheila Mccoy has had a recent MoCA evaluation with a score of 18/30.  Cranial nerves: Facial symmetry is present. There is good sensation of Sheila face to pinprick and soft touch bilaterally. Sheila strength of Sheila facial muscles and Sheila muscles to head turning and shoulder shrug are normal bilaterally. Speech is well enunciated, no aphasia or dysarthria is noted. Extraocular movements are full. Visual fields are full. Sheila tongue is midline, and Sheila Mccoy has symmetric elevation of Sheila soft palate. No obvious hearing deficits are noted.  Motor: Sheila motor testing reveals 4/5 strength with Sheila biceps, triceps, and intrinsic muscles of Sheila hands bilaterally with 4 -/5 strength of Sheila deltoid muscles bilaterally and some weakness with Sheila neck flexion, not with neck extension.  With Sheila lower extremities, Sheila Mccoy has 4/5 strength with hip flexion, she has good knee extension strength but she has 4/5 strength with hamstring muscles bilaterally and mild bilateral foot drop.  Sensory: Sensory testing is intact to pinprick, soft touch, vibration sensation, and position sense on all 4 extremities. No evidence of extinction is noted.  Coordination: Cerebellar  testing reveals good finger-nose-finger and heel-to-shin bilaterally.  Gait and station: Gait with a walker is unremarkable, she has good stride and good turns.  She has unsteadiness with walking tandem gait.  Romberg is negative but she is unsteady.  Sheila Mccoy is able to arise from a seated position with arms crossed with some difficulty.  Reflexes: Deep tendon reflexes are symmetric and normal bilaterally, with exception that Sheila right biceps reflex is depressed as compared to Sheila left. Toes are downgoing bilaterally.   MRI brain 09/30/20:  IMPRESSION: 1. Two separate 4-5 mm foci of diffusion abnormality involving Sheila cortical and subcortical aspect of Sheila right parietal lobe, consistent with small acute to early subacute ischemic infarcts. No associated hemorrhage or mass effect. 2. No other acute intracranial abnormality. 3. Age-related cerebral atrophy with moderate chronic small vessel ischemic disease.  * MRI scan images were reviewed online. I agree with Sheila written report.    Assessment/Plan:  1.  Gait disorder  2.  Generalized weakness, all 4 extremities  3.  Memory disorder  4.  Recent stroke event  5.  Left carotid bruit  Sheila Mccoy does have a memory disorder/dementia.  She requires assistance for activities of daily living.  She will be started on Aricept 5 mg at night.  She refuses to have MRI of Sheila cervical spine, I will get a CT scan of Sheila cervical spine to look for Sheila cause of Sheila quadriparesis.  Sheila Mccoy will have blood work done today.  If Sheila above studies are unremarkable, will consider EMG and nerve conduction study evaluation to look for a myopathic disorder.  Sheila arms seem to be affected more so than Sheila legs.  She will otherwise follow-up in 4 months.   Sheila Palau MD 01/02/2021 10:52 AM  [facility] [address] [place], [place] [address]  Phone [phone] Fax [phone]

## 2021-01-03 ENCOUNTER — Telehealth: Payer: Self-pay | Admitting: Neurology

## 2021-01-03 NOTE — Telephone Encounter (Signed)
[employer] pending uploaded notes  

## 2021-01-03 NOTE — Telephone Encounter (Signed)
Francine Graven Berkley Harvey: [id number] (exp. 01/03/21 to 02/02/21) order sent to GI. They will reach out to the patient to schedule

## 2021-01-04 ENCOUNTER — Telehealth: Payer: Self-pay | Admitting: Neurology

## 2021-01-04 DIAGNOSIS — R269 Unspecified abnormalities of gait and mobility: Secondary | ICD-10-CM

## 2021-01-04 NOTE — Addendum Note (Signed)
Addended by: York Spaniel on: 01/04/2021 01:41 PM   Modules accepted: Orders

## 2021-01-04 NOTE — Telephone Encounter (Signed)
Called and spoke to patient.  Informed her I was forwarding her message to Dr. Anne Hahn and he would review her chart and order the MRI if necessary.    Made patient aware it could be up to 2 weeks for scheduling of the any testing.  Patient denied further questions, verbalized understanding and expressed appreciation for the phone call.

## 2021-01-04 NOTE — Telephone Encounter (Signed)
[employer] pending  

## 2021-01-04 NOTE — Telephone Encounter (Signed)
I called the daughter, I will cancel the order for the CT of the cervical spine, get MRI of the cervical spine.

## 2021-01-04 NOTE — Telephone Encounter (Signed)
Pt's daughter, Gaylan Gerold (on [place]) called, she has decided to get the MRI., can cancel CT Scan. Would like a call from the nurse.

## 2021-01-06 LAB — RPR: RPR Ser Ql: NONREACTIVE

## 2021-01-06 LAB — SEDIMENTATION RATE: Sed Rate: 10 mm/hr (ref 0–40)

## 2021-01-06 LAB — ANA W/REFLEX: Anti Nuclear Antibody (ANA): POSITIVE — AB

## 2021-01-06 LAB — ENA+DNA/DS+SJORGEN'S
ENA RNP Ab: <0.2 AI (ref 0.0–0.9)
ENA SM Ab Ser-aCnc: <0.2 AI (ref 0.0–0.9)
ENA SSA (RO) Ab: 0.2 AI (ref 0.0–0.9)
ENA SSB (LA) Ab: <0.2 AI (ref 0.0–0.9)
dsDNA Ab: <1 IU/mL (ref 0–9)

## 2021-01-06 LAB — ACETYLCHOLINE RECEPTOR, BINDING: AChR Binding Ab, Serum: 0.07 nmol/L (ref 0.00–0.24)

## 2021-01-06 LAB — VGCC ANTIBODY: VGCC Antibody: <1 pmol/L (ref 0.0–30.0)

## 2021-01-06 LAB — CK: Total CK: 32 U/L (ref 26–161)

## 2021-01-06 LAB — ANGIOTENSIN CONVERTING ENZYME: Angio Convert Enzyme: 66 U/L (ref 14–82)

## 2021-01-06 LAB — COPPER, SERUM: Copper: 136 ug/dL (ref 80–158)

## 2021-01-08 NOTE — Telephone Encounter (Signed)
Francine Graven Berkley Harvey: [id number] (exp. 01/04/21 to 02/03/21) order sent to GI. They will reach out to the patient to schedule.

## 2021-01-10 ENCOUNTER — Telehealth: Payer: Self-pay | Admitting: *Deleted

## 2021-01-10 NOTE — Telephone Encounter (Signed)
Called patient, daughter Sheila Mccoy on [place] answered. I advised her the Blood work is unremarkable with exception of positive ANA, but antibody panel is completely negative, unlikely to be of clinical significance.  She verbalized understanding, appreciation.

## 2021-01-11 ENCOUNTER — Observation Stay (HOSPITAL_BASED_OUTPATIENT_CLINIC_OR_DEPARTMENT_OTHER)
Admission: EM | Admit: 2021-01-11 | Discharge: 2021-01-18 | Disposition: A | Discharge status: Home / Self Care | Payer: Medicare HMO | Attending: Student | Admitting: Student

## 2021-01-11 ENCOUNTER — Other Ambulatory Visit: Payer: Self-pay

## 2021-01-11 ENCOUNTER — Encounter (HOSPITAL_BASED_OUTPATIENT_CLINIC_OR_DEPARTMENT_OTHER): Payer: Self-pay

## 2021-01-11 ENCOUNTER — Emergency Department (HOSPITAL_BASED_OUTPATIENT_CLINIC_OR_DEPARTMENT_OTHER): Payer: Medicare HMO | Admitting: Radiology

## 2021-01-11 DIAGNOSIS — K5900 Constipation, unspecified: Secondary | ICD-10-CM | POA: Insufficient documentation

## 2021-01-11 DIAGNOSIS — E871 Hypo-osmolality and hyponatremia: Secondary | ICD-10-CM | POA: Diagnosis not present

## 2021-01-11 DIAGNOSIS — Z79899 Other long term (current) drug therapy: Secondary | ICD-10-CM | POA: Diagnosis not present

## 2021-01-11 DIAGNOSIS — Y92009 Unspecified place in unspecified non-institutional (private) residence as the place of occurrence of the external cause: Secondary | ICD-10-CM | POA: Insufficient documentation

## 2021-01-11 DIAGNOSIS — E876 Hypokalemia: Secondary | ICD-10-CM | POA: Diagnosis not present

## 2021-01-11 DIAGNOSIS — Z8673 Personal history of transient ischemic attack (TIA), and cerebral infarction without residual deficits: Secondary | ICD-10-CM | POA: Insufficient documentation

## 2021-01-11 DIAGNOSIS — J449 Chronic obstructive pulmonary disease, unspecified: Secondary | ICD-10-CM | POA: Insufficient documentation

## 2021-01-11 DIAGNOSIS — Z20822 Contact with and (suspected) exposure to covid-19: Secondary | ICD-10-CM | POA: Insufficient documentation

## 2021-01-11 DIAGNOSIS — Z9981 Dependence on supplemental oxygen: Secondary | ICD-10-CM | POA: Diagnosis not present

## 2021-01-11 DIAGNOSIS — I5032 Chronic diastolic (congestive) heart failure: Secondary | ICD-10-CM | POA: Diagnosis not present

## 2021-01-11 DIAGNOSIS — K219 Gastro-esophageal reflux disease without esophagitis: Secondary | ICD-10-CM | POA: Diagnosis present

## 2021-01-11 DIAGNOSIS — S32010A Wedge compression fracture of first lumbar vertebra, initial encounter for closed fracture: Secondary | ICD-10-CM | POA: Diagnosis not present

## 2021-01-11 DIAGNOSIS — S2231XA Fracture of one rib, right side, initial encounter for closed fracture: Secondary | ICD-10-CM | POA: Diagnosis not present

## 2021-01-11 DIAGNOSIS — E039 Hypothyroidism, unspecified: Secondary | ICD-10-CM | POA: Diagnosis not present

## 2021-01-11 DIAGNOSIS — Y9301 Activity, walking, marching and hiking: Secondary | ICD-10-CM | POA: Diagnosis not present

## 2021-01-11 DIAGNOSIS — R52 Pain, unspecified: Secondary | ICD-10-CM

## 2021-01-11 DIAGNOSIS — S22050A Wedge compression fracture of T5-T6 vertebra, initial encounter for closed fracture: Secondary | ICD-10-CM | POA: Diagnosis not present

## 2021-01-11 DIAGNOSIS — W010XXA Fall on same level from slipping, tripping and stumbling without subsequent striking against object, initial encounter: Secondary | ICD-10-CM | POA: Insufficient documentation

## 2021-01-11 DIAGNOSIS — W19XXXA Unspecified fall, initial encounter: Secondary | ICD-10-CM

## 2021-01-11 DIAGNOSIS — S299XXA Unspecified injury of thorax, initial encounter: Secondary | ICD-10-CM | POA: Diagnosis present

## 2021-01-11 DIAGNOSIS — J9611 Chronic respiratory failure with hypoxia: Secondary | ICD-10-CM | POA: Diagnosis present

## 2021-01-11 DIAGNOSIS — S2239XA Fracture of one rib, unspecified side, initial encounter for closed fracture: Secondary | ICD-10-CM | POA: Diagnosis present

## 2021-01-11 DIAGNOSIS — M549 Dorsalgia, unspecified: Secondary | ICD-10-CM

## 2021-01-11 DIAGNOSIS — F32A Depression, unspecified: Secondary | ICD-10-CM | POA: Diagnosis present

## 2021-01-11 LAB — CBC WITH DIFFERENTIAL/PLATELET
Abs Immature Granulocytes: 0.07 10*3/uL (ref 0.00–0.07)
Basophils Absolute: 0.1 10*3/uL (ref 0.0–0.1)
Basophils Relative: 1 %
Eosinophils Absolute: 0.4 10*3/uL (ref 0.0–0.5)
Eosinophils Relative: 2 %
HCT: 39.3 % (ref 36.0–46.0)
Hemoglobin: 13.1 g/dL (ref 12.0–15.0)
Immature Granulocytes: 1 %
Lymphocytes Relative: 9 %
Lymphs Abs: 1.3 10*3/uL (ref 0.7–4.0)
MCH: 30.3 pg (ref 26.0–34.0)
MCHC: 33.3 g/dL (ref 30.0–36.0)
MCV: 90.8 fL (ref 80.0–100.0)
Monocytes Absolute: 1.2 10*3/uL — ABNORMAL HIGH (ref 0.1–1.0)
Monocytes Relative: 8 %
Neutro Abs: 12.3 10*3/uL — ABNORMAL HIGH (ref 1.7–7.7)
Neutrophils Relative %: 79 %
Platelets: 383 10*3/uL (ref 150–400)
RBC: 4.33 MIL/uL (ref 3.87–5.11)
RDW: 14 % (ref 11.5–15.5)
WBC: 15.3 10*3/uL — ABNORMAL HIGH (ref 4.0–10.5)
nRBC: 0 % (ref 0.0–0.2)

## 2021-01-11 LAB — BRAIN NATRIURETIC PEPTIDE: B Natriuretic Peptide: 122.9 pg/mL — ABNORMAL HIGH (ref 0.0–100.0)

## 2021-01-11 LAB — RESP PANEL BY RT-PCR (FLU A&B, COVID) ARPGX2
Influenza A by PCR: NEGATIVE
Influenza B by PCR: NEGATIVE
SARS Coronavirus 2 by RT PCR: NEGATIVE

## 2021-01-11 LAB — POTASSIUM: Potassium: 4.5 mmol/L (ref 3.5–5.1)

## 2021-01-11 MED ORDER — LIDOCAINE 5 % EX PTCH
1.0000 | MEDICATED_PATCH | CUTANEOUS | Status: DC
  Administered 2021-01-11 – 2021-01-13 (×3): 1 via TRANSDERMAL
  Filled 2021-01-11 (×10): qty 1

## 2021-01-11 MED ORDER — OXYCODONE-ACETAMINOPHEN 5-325 MG PO TABS
1.0000 | ORAL_TABLET | Freq: Once | ORAL | Status: AC
  Administered 2021-01-11: 1 via ORAL
  Filled 2021-01-11: qty 1

## 2021-01-11 MED ORDER — FENTANYL CITRATE (PF) 100 MCG/2ML IJ SOLN
12.5000 ug | Freq: Once | INTRAMUSCULAR | Status: AC
  Administered 2021-01-11: 12.5 ug via INTRAVENOUS
  Filled 2021-01-11: qty 2

## 2021-01-11 MED ORDER — ACETAMINOPHEN 500 MG PO TABS
1000.0000 mg | ORAL_TABLET | Freq: Once | ORAL | Status: AC
  Administered 2021-01-11: 1000 mg via ORAL
  Filled 2021-01-11: qty 2

## 2021-01-11 MED ORDER — HYDROCODONE-ACETAMINOPHEN 5-325 MG PO TABS: 1.0000 | ORAL_TABLET | Freq: Once | ORAL | Status: DC

## 2021-01-11 NOTE — ED Notes (Signed)
Patient and Family Visitor made aware of Current Bed Status. Family Visitor signed Transfer Consent Form with Permission from the Patient.

## 2021-01-11 NOTE — ED Notes (Signed)
Care Handoff/Report provided to [place], RN at [facility]. All questions answered.

## 2021-01-11 NOTE — ED Provider Notes (Signed)
[facility] EMERGENCY DEPT Provider Note   CSN: [id number] Arrival date & time: 01/11/21  1737     History Chief Complaint  Patient presents with  . Fall    Megin Consalvo is a 87 y.o. female.  Patient is an 87 year old female with a history of COPD on chronic oxygen, thyroid disease and prior TIA who is presenting today after a fall.  Patient reports approximately 1 to 2 hours prior to arrival she was walking in her foot got wrapped in her oxygen tubing causing her to fall backward onto her right side on the linoleum.  She did not hit her head or lose consciousness.  Since the fall she has been having pain in the right side of her back.  It is painful when taking deep breaths and certain movements.  The pain does not radiate.  She has no frontal chest pain, abdominal pain, central back pain, neck pain or headache.  She denies feeling short of breath at this time and feels that she is at her baseline in that regard.  She has not taken anything for the pain but does report that it is very uncomfortable.  She has been able to ambulate since the fall and denies any pain in her hips or legs.  The history is provided by the patient and a relative.  Fall       Past Medical History:  Diagnosis Date  . Acute metabolic encephalopathy 07/09/2020  . Adnexal cyst 07/09/2020  . Anxiety   . Bipolar disorder (HCC)   . COPD (chronic obstructive pulmonary disease) (HCC)   . COPD (chronic obstructive pulmonary disease) (HCC)   . Depression   . History of CT scan 2021   Legs.  . Hypothyroidism 09/30/2020  . Low thyroid stimulating hormone (TSH) level   . Memory difficulty 01/02/2021  . Sacral fracture (HCC) 07/09/2020  . Thyroid disease   . TIA (transient ischemic attack) 09/29/2020  . UTI (urinary tract infection) 07/09/2020    Patient Active Problem List   Diagnosis Date Noted  . Memory difficulty 01/02/2021  . Hypothyroidism 09/30/2020  . Depression 09/30/2020  . TIA  (transient ischemic attack) 09/29/2020  . Anxiety 07/12/2020  . UTI (urinary tract infection) 07/09/2020  . Acute metabolic encephalopathy 07/09/2020  . Sacral fracture (HCC) 07/09/2020  . COPD (chronic obstructive pulmonary disease) (HCC) 07/09/2020  . Adnexal cyst 07/09/2020    Past Surgical History:  Procedure Laterality Date  . arm fracture surgery    . MRI  2021   Lungs.  . right shoulder      pins in right shoulder.     OB History   No obstetric history on file.     Family History  Problem Relation Age of Onset  . Lung cancer Mother   . Cancer Sister   . Cancer Brother   . Cancer Sister   . Sarcoidosis Brother   . Leukemia Daughter     Social History   Tobacco Use  . Smoking status: Never Smoker  . Smokeless tobacco: Never Used  Vaping Use  . Vaping Use: Never used  Substance Use Topics  . Alcohol use: Never  . Drug use: Never    Home Medications Prior to Admission medications   Medication Sig Start Date End Date Taking? Authorizing Provider  Acetaminophen (TYLENOL PO) Take by mouth.    [provider]  albuterol (VENTOLIN HFA) 108 (90 Base) MCG/ACT inhaler Inhale 1-2 puffs into the lungs every 6 (six) hours as  needed for wheezing or shortness of breath. 06/08/20   Janace Aris, NP  ALPRAZolam Prudy Feeler) 0.5 MG tablet Take 2 tablets approximately 45 minutes prior to the MRI study, take a third tablet if needed. 01/02/21   York Spaniel, MD  budesonide-formoterol (SYMBICORT) 80-4.5 MCG/ACT inhaler Inhale 1 puff into the lungs 2 (two) times daily.    [provider]  buPROPion (WELLBUTRIN) 75 MG tablet Take 75 mg by mouth daily.  06/13/20   [provider]  Calcium-Magnesium-Vitamin D (CALCIUM MAGNESIUM PO) Take 1 capsule by mouth 2 (two) times daily as needed.    [provider]  Cholecalciferol (VITAMIN D3 PO) Take 1,000 mg by mouth 2 (two) times daily as needed.    [provider]  clopidogrel (PLAVIX) 75 MG  tablet Take 1 tablet (75 mg total) by mouth daily. 12/28/20   Ngetich, Dinah C, NP  Coenzyme Q10 (CO Q 10 PO) Take 120 mg by mouth 2 (two) times daily as needed.    [provider]  CRANBERRY PO Take 200 mg by mouth 2 (two) times daily as needed.    [provider]  donepezil (ARICEPT) 5 MG tablet Take 1 tablet (5 mg total) by mouth at bedtime. 01/02/21   York Spaniel, MD  dorzolamide-timolol (COSOPT) 22.3-6.8 MG/ML ophthalmic solution Place 1 drop into the left eye 2 (two) times daily.    [provider]  furosemide (LASIX) 20 MG tablet Take 20 mg by mouth. Monday and Thursday.    [provider]  latanoprost (XALATAN) 0.005 % ophthalmic solution Place 1 drop into the left eye at bedtime.    [provider]  Levothyroxine Sodium 137 MCG CAPS Take 68.5 mcg by mouth daily before breakfast. 1/2 tablet on empty stomach 1 hour before a meal with glass of water.    [provider]  mirtazapine (REMERON) 15 MG tablet Take 15 mg by mouth at bedtime.  06/13/20   [provider]  Omega-3 Fatty Acids (OMEGA 3 PO) Take 1,300 mg by mouth daily.    [provider]  OVER THE COUNTER MEDICATION Take 1 drop by mouth daily. Vitamin B12    [provider]  pantoprazole (PROTONIX) 40 MG tablet Take 40 mg by mouth daily. 11/01/20   [provider]  risperiDONE (RISPERDAL) 0.5 MG tablet Take 0.5 mg by mouth at bedtime.  05/15/20   [provider]  VITAMIN E PO Take 300 mg by mouth 2 (two) times daily as needed.    [provider]    Allergies    Crestor [rosuvastatin], Hydromorphone, Lipitor [atorvastatin], Macrobid [nitrofurantoin], and Morphine and related  Review of Systems   Review of Systems  All other systems reviewed and are negative.   Physical Exam Updated Vital Signs BP (!) 167/79 (BP Location: Left Arm)   Pulse 64   Temp (!) 97.5 F (36.4 C) (Oral)   Resp (!) 22   Ht 5\' 3"  (1.6 m)   Wt 65.3  kg   SpO2 99%   BMI 25.50 kg/m   Physical Exam Vitals and nursing note reviewed.  Constitutional:      General: She is not in acute distress.    Appearance: Normal appearance. She is well-developed.  HENT:     Head: Normocephalic and atraumatic.  Eyes:     Pupils: Pupils are equal, round, and reactive to light.  Cardiovascular:     Rate and Rhythm: Normal rate and regular rhythm.  Heart sounds: Normal heart sounds. No murmur heard. No friction rub.  Pulmonary:     Effort: Pulmonary effort is normal.     Breath sounds: Normal breath sounds. No wheezing or rales.  Abdominal:     General: Bowel sounds are normal. There is no distension.     Palpations: Abdomen is soft.     Tenderness: There is no abdominal tenderness. There is no guarding or rebound.  Musculoskeletal:        General: No tenderness. Normal range of motion.     Cervical back: Normal range of motion and neck supple. No spinous process tenderness or muscular tenderness.       Back:     Right lower leg: Edema present.     Left lower leg: Edema present.     Comments: 1+ pitting edema in the lower ext bilaterally to the mid shin  Skin:    General: Skin is warm and dry.     Findings: No rash.  Neurological:     Mental Status: She is alert and oriented to person, place, and time. Mental status is at baseline.     Cranial Nerves: No cranial nerve deficit.     Comments: Patient able to ambulate with her rolling walker without difficulty.  Psychiatric:        Mood and Affect: Mood normal.        Behavior: Behavior normal.        Thought Content: Thought content normal.     ED Results / Procedures / Treatments   Labs (all labs ordered are listed, but only abnormal results are displayed) Labs Reviewed  CBC WITH DIFFERENTIAL/PLATELET - Abnormal; Notable for the following components:      Result Value   WBC 15.3 (*)    Neutro Abs 12.3 (*)    Monocytes Absolute 1.2 (*)    All other components within normal  limits  COMPREHENSIVE METABOLIC PANEL - Abnormal; Notable for the following components:   Sodium 132 (*)    Potassium 6.0 (*)    Chloride 97 (*)    Glucose, Bld 128 (*)    AST 64 (*)    All other components within normal limits  POTASSIUM  BRAIN NATRIURETIC PEPTIDE    EKG None  Radiology DG Ribs Unilateral W/Chest Right  Result Date: 01/11/2021 CLINICAL DATA:  Fall, back pain EXAM: RIGHT RIBS AND CHEST - 3+ VIEW COMPARISON:  07/11/2020 FINDINGS: Lateral right 8th rib fracture, mildly displaced. No pneumothorax or effusion. No confluent airspace opacities. Heart is normal size. IMPRESSION: Right lateral 8th rib fracture. No effusion or pneumothorax. Electronically Signed   By: Charlett NoseKevin  Dover M.D.   On: 01/11/2021 19:14    Procedures Procedures   Medications Ordered in ED Medications  lidocaine (LIDODERM) 5 % 1 patch (has no administration in time range)  acetaminophen (TYLENOL) tablet 1,000 mg (has no administration in time range)    ED Course  I have reviewed the triage vital signs and the nursing notes.  Pertinent labs & imaging results that were available during my care of the patient were reviewed by me and considered in my medical decision making (see chart for details).    MDM Rules/Calculators/A&P                          Elderly female presenting today after a fall at home.  She has posterior right-sided rib pain.  No flank pain or abdominal pain.  Patient denies  any shortness of breath and currently satting 99% on her home oxygen.  Concern for rib fractures given patient's fall.  She does not take anticoagulation had no head injury or loss of consciousness.  Patient has no other evidence of injury at this time.  X-ray shows right lateral 8th rib fracture.  Patient initially given Tylenol and Lidoderm patch to see if this will provide pain control.  8:21 PM After Tylenol and Lidoderm patient is still having severe pain is unable to stand or ambulate on her own.  She was  given Vicodin we will continue to monitor.  Also daughter reported that she has had worsening swelling in her lower legs over the last week or so because she was being over diuresed by her Lasix it had been changed to every other day but now she has had more fluid buildup.  This also causes her to have to urinate more frequently and pt lives at home alone.  10:34 PM After Percocet patient is still having significant pain, CBC with leukocytosis of 15,000 which is most likely related to acute phase reaction from the fall, CMP initially with a potassium of 6.0 however repeat was 4.5 otherwise no acute findings.  However given patient's still ongoing pain, difficulty with mobility, sensitivity to narcotics and living alone will admit for pain control and further observation.  MDM Number of Diagnoses or Management Options   Amount and/or Complexity of Data Reviewed Clinical lab tests: ordered and reviewed Tests in the radiology section of CPT: ordered and reviewed Tests in the medicine section of CPT: ordered and reviewed    Final Clinical Impression(s) / ED Diagnoses Final diagnoses:  Fall, initial encounter  Closed fracture of one rib of right side, initial encounter    Rx / DC Orders ED Discharge Orders    None       Gwyneth Sprout, MD 01/11/21 2241

## 2021-01-11 NOTE — ED Notes (Signed)
Called [facility] to transport patient to [facility] [address] rm[address] [id number]

## 2021-01-11 NOTE — ED Provider Notes (Incomplete)
[facility] EMERGENCY DEPT Provider Note   CSN: [id number] Arrival date & time: 01/11/21  1737     History Chief Complaint  Patient presents with  . Fall    Sheila Mccoy is a 87 y.o. female.  The history is provided by the patient and a relative.  Fall       Past Medical History:  Diagnosis Date  . Acute metabolic encephalopathy 07/09/2020  . Adnexal cyst 07/09/2020  . Anxiety   . Bipolar disorder (HCC)   . COPD (chronic obstructive pulmonary disease) (HCC)   . COPD (chronic obstructive pulmonary disease) (HCC)   . Depression   . History of CT scan 2021   Legs.  . Hypothyroidism 09/30/2020  . Low thyroid stimulating hormone (TSH) level   . Memory difficulty 01/02/2021  . Sacral fracture (HCC) 07/09/2020  . Thyroid disease   . TIA (transient ischemic attack) 09/29/2020  . UTI (urinary tract infection) 07/09/2020    Patient Active Problem List   Diagnosis Date Noted  . Memory difficulty 01/02/2021  . Hypothyroidism 09/30/2020  . Depression 09/30/2020  . TIA (transient ischemic attack) 09/29/2020  . Anxiety 07/12/2020  . UTI (urinary tract infection) 07/09/2020  . Acute metabolic encephalopathy 07/09/2020  . Sacral fracture (HCC) 07/09/2020  . COPD (chronic obstructive pulmonary disease) (HCC) 07/09/2020  . Adnexal cyst 07/09/2020    Past Surgical History:  Procedure Laterality Date  . arm fracture surgery    . MRI  2021   Lungs.  . right shoulder      pins in right shoulder.     OB History   No obstetric history on file.     Family History  Problem Relation Age of Onset  . Lung cancer Mother   . Cancer Sister   . Cancer Brother   . Cancer Sister   . Sarcoidosis Brother   . Leukemia Daughter     Social History   Tobacco Use  . Smoking status: Never Smoker  . Smokeless tobacco: Never Used  Vaping Use  . Vaping Use: Never used  Substance Use Topics  . Alcohol use: Never  . Drug use: Never    Home Medications Prior to  Admission medications   Medication Sig Start Date End Date Taking? Authorizing Provider  Acetaminophen (TYLENOL PO) Take by mouth.    [provider]  albuterol (VENTOLIN HFA) 108 (90 Base) MCG/ACT inhaler Inhale 1-2 puffs into the lungs every 6 (six) hours as needed for wheezing or shortness of breath. 06/08/20   Janace Aris, NP  ALPRAZolam Prudy Feeler) 0.5 MG tablet Take 2 tablets approximately 45 minutes prior to the MRI study, take a third tablet if needed. 01/02/21   York Spaniel, MD  budesonide-formoterol (SYMBICORT) 80-4.5 MCG/ACT inhaler Inhale 1 puff into the lungs 2 (two) times daily.    [provider]  buPROPion (WELLBUTRIN) 75 MG tablet Take 75 mg by mouth daily.  06/13/20   [provider]  Calcium-Magnesium-Vitamin D (CALCIUM MAGNESIUM PO) Take 1 capsule by mouth 2 (two) times daily as needed.    [provider]  Cholecalciferol (VITAMIN D3 PO) Take 1,000 mg by mouth 2 (two) times daily as needed.    [provider]  clopidogrel (PLAVIX) 75 MG tablet Take 1 tablet (75 mg total) by mouth daily. 12/28/20   Ngetich, Dinah C, NP  Coenzyme Q10 (CO Q 10 PO) Take 120 mg by mouth 2 (two) times daily as needed.    [provider]  CRANBERRY PO Take 200 mg by mouth 2 (two) times daily as needed.    [provider]  donepezil (ARICEPT) 5 MG tablet Take 1 tablet (5 mg total) by mouth at bedtime. 01/02/21   York Spaniel, MD  dorzolamide-timolol (COSOPT) 22.3-6.8 MG/ML ophthalmic solution Place 1 drop into the left eye 2 (two) times daily.    [provider]  furosemide (LASIX) 20 MG tablet Take 20 mg by mouth. Monday and Thursday.    [provider]  latanoprost (XALATAN) 0.005 % ophthalmic solution Place 1 drop into the left eye at bedtime.    [provider]  Levothyroxine Sodium 137 MCG CAPS Take 68.5 mcg by mouth daily before breakfast. 1/2 tablet on empty stomach 1 hour before a meal with glass of water.     [provider]  mirtazapine (REMERON) 15 MG tablet Take 15 mg by mouth at bedtime.  06/13/20   [provider]  Omega-3 Fatty Acids (OMEGA 3 PO) Take 1,300 mg by mouth daily.    [provider]  OVER THE COUNTER MEDICATION Take 1 drop by mouth daily. Vitamin B12    [provider]  pantoprazole (PROTONIX) 40 MG tablet Take 40 mg by mouth daily. 11/01/20   [provider]  risperiDONE (RISPERDAL) 0.5 MG tablet Take 0.5 mg by mouth at bedtime.  05/15/20   [provider]  VITAMIN E PO Take 300 mg by mouth 2 (two) times daily as needed.    [provider]    Allergies    Crestor [rosuvastatin], Hydromorphone, Lipitor [atorvastatin], Macrobid [nitrofurantoin], and Morphine and related  Review of Systems   Review of Systems  Physical Exam Updated Vital Signs BP (!) 167/79 (BP Location: Left Arm)   Pulse 64   Temp (!) 97.5 F (36.4 C) (Oral)   Resp (!) 22   Ht 5\' 3"  (1.6 m)   Wt 65.3 kg   SpO2 99%   BMI 25.50 kg/m   Physical Exam  ED Results / Procedures / Treatments   Labs (all labs ordered are listed, but only abnormal results are displayed) Labs Reviewed - No data to display  EKG None  Radiology No results found.  Procedures Procedures {Remember to document critical care time when appropriate:1}  Medications Ordered in ED Medications  lidocaine (LIDODERM) 5 % 1 patch (has no administration in time range)  acetaminophen (TYLENOL) tablet 1,000 mg (has no administration in time range)    ED Course  I have reviewed the triage vital signs and the nursing notes.  Pertinent labs & imaging results that were available during my care of the patient were reviewed by me and considered in my medical decision making (see chart for details).    MDM Rules/Calculators/A&P                          *** Final Clinical Impression(s) / ED Diagnoses Final diagnoses:  None    Rx / DC Orders ED Discharge Orders     None

## 2021-01-11 NOTE — ED Notes (Signed)
[facility] at the Bedside. Personal Belongings provided to [facility]. Other belongings sent Home with Family Visitor.

## 2021-01-11 NOTE — ED Triage Notes (Signed)
Pt reports not knowing how she fell. Pt daughter reports that she fell thinking she tripped onto her oxygen cord. Pt c/o back pain

## 2021-01-11 NOTE — ED Notes (Signed)
Patient made as comfortable as possible in Stretcher. Medication administered and Warm Blankets provided. Patient instructed on Call Bell use while Family Visitor is away. RN will continue to Monitor. Pulse Oximetry in Use currently.

## 2021-01-11 NOTE — ED Notes (Signed)
Care Handoff/Report given Wilber Oliphant, [facility]; all questions answered. ETA 10-15 minutes.

## 2021-01-12 ENCOUNTER — Encounter (HOSPITAL_COMMUNITY): Payer: Self-pay | Admitting: Family Medicine

## 2021-01-12 DIAGNOSIS — S32010A Wedge compression fracture of first lumbar vertebra, initial encounter for closed fracture: Secondary | ICD-10-CM | POA: Diagnosis not present

## 2021-01-12 DIAGNOSIS — J9611 Chronic respiratory failure with hypoxia: Secondary | ICD-10-CM

## 2021-01-12 DIAGNOSIS — K219 Gastro-esophageal reflux disease without esophagitis: Secondary | ICD-10-CM | POA: Diagnosis present

## 2021-01-12 DIAGNOSIS — I5032 Chronic diastolic (congestive) heart failure: Secondary | ICD-10-CM

## 2021-01-12 DIAGNOSIS — E039 Hypothyroidism, unspecified: Secondary | ICD-10-CM

## 2021-01-12 DIAGNOSIS — S2231XA Fracture of one rib, right side, initial encounter for closed fracture: Secondary | ICD-10-CM | POA: Diagnosis not present

## 2021-01-12 DIAGNOSIS — F32A Depression, unspecified: Secondary | ICD-10-CM | POA: Diagnosis not present

## 2021-01-12 DIAGNOSIS — S22050A Wedge compression fracture of T5-T6 vertebra, initial encounter for closed fracture: Secondary | ICD-10-CM | POA: Diagnosis not present

## 2021-01-12 DIAGNOSIS — Z20822 Contact with and (suspected) exposure to covid-19: Secondary | ICD-10-CM | POA: Diagnosis not present

## 2021-01-12 LAB — CBC WITH DIFFERENTIAL/PLATELET
Abs Immature Granulocytes: 0.07 10*3/uL (ref 0.00–0.07)
Basophils Absolute: 0.1 10*3/uL (ref 0.0–0.1)
Basophils Relative: 0 %
Eosinophils Absolute: 0.2 10*3/uL (ref 0.0–0.5)
Eosinophils Relative: 2 %
HCT: 40.2 % (ref 36.0–46.0)
Hemoglobin: 12.9 g/dL (ref 12.0–15.0)
Immature Granulocytes: 1 %
Lymphocytes Relative: 8 %
Lymphs Abs: 1 10*3/uL (ref 0.7–4.0)
MCH: 29.9 pg (ref 26.0–34.0)
MCHC: 32.1 g/dL (ref 30.0–36.0)
MCV: 93.3 fL (ref 80.0–100.0)
Monocytes Absolute: 1.3 10*3/uL — ABNORMAL HIGH (ref 0.1–1.0)
Monocytes Relative: 11 %
Neutro Abs: 9.6 10*3/uL — ABNORMAL HIGH (ref 1.7–7.7)
Neutrophils Relative %: 78 %
Platelets: 344 10*3/uL (ref 150–400)
RBC: 4.31 MIL/uL (ref 3.87–5.11)
RDW: 13.8 % (ref 11.5–15.5)
WBC: 12.2 10*3/uL — ABNORMAL HIGH (ref 4.0–10.5)
nRBC: 0 % (ref 0.0–0.2)

## 2021-01-12 LAB — COMPREHENSIVE METABOLIC PANEL
ALT: 28 U/L (ref 0–44)
AST: 34 U/L (ref 15–41)
Albumin: 3.8 g/dL (ref 3.5–5.0)
Alkaline Phosphatase: 99 U/L (ref 38–126)
Anion gap: 11 (ref 5–15)
BUN: 15 mg/dL (ref 8–23)
CO2: 23 mmol/L (ref 22–32)
Calcium: 9.2 mg/dL (ref 8.9–10.3)
Chloride: 99 mmol/L (ref 98–111)
Creatinine, Ser: 0.69 mg/dL (ref 0.44–1.00)
GFR, Estimated: >60 mL/min (ref >60)
Glucose, Bld: 122 mg/dL — ABNORMAL HIGH (ref 70–99)
Potassium: 3.8 mmol/L (ref 3.5–5.1)
Sodium: 133 mmol/L — ABNORMAL LOW (ref 135–145)
Total Bilirubin: 0.6 mg/dL (ref 0.3–1.2)
Total Protein: 7.3 g/dL (ref 6.5–8.1)

## 2021-01-12 LAB — PROTIME-INR
INR: 1 (ref 0.8–1.2)
Prothrombin Time: 12.6 seconds (ref 11.4–15.2)

## 2021-01-12 LAB — MAGNESIUM: Magnesium: 1.7 mg/dL (ref 1.7–2.4)

## 2021-01-12 LAB — APTT: aPTT: 30 seconds (ref 24–36)

## 2021-01-12 LAB — VITAMIN D 25 HYDROXY (VIT D DEFICIENCY, FRACTURES): Vit D, 25-Hydroxy: 54.54 ng/mL (ref 30–100)

## 2021-01-12 MED ORDER — POLYETHYLENE GLYCOL 3350 17 G PO PACK: 17.0000 g | PACK | Freq: Every day | ORAL | Status: DC | PRN

## 2021-01-12 MED ORDER — ONDANSETRON HCL 4 MG PO TABS: 4.0000 mg | ORAL_TABLET | Freq: Four times a day (QID) | ORAL | Status: DC | PRN

## 2021-01-12 MED ORDER — ACETAMINOPHEN 650 MG RE SUPP: 650.0000 mg | Freq: Four times a day (QID) | RECTAL | Status: DC | PRN

## 2021-01-12 MED ORDER — FENTANYL CITRATE (PF) 100 MCG/2ML IJ SOLN: 50.0000 ug | INTRAMUSCULAR | Status: DC | PRN

## 2021-01-12 MED ORDER — FLUTICASONE FUROATE-VILANTEROL 100-25 MCG/INH IN AEPB
1.0000 | INHALATION_SPRAY | Freq: Every day | RESPIRATORY_TRACT | Status: DC
  Administered 2021-01-12 – 2021-01-18 (×7): 1 via RESPIRATORY_TRACT
  Filled 2021-01-12: qty 28

## 2021-01-12 MED ORDER — MIRTAZAPINE 15 MG PO TABS
15.0000 mg | ORAL_TABLET | Freq: Every day | ORAL | Status: DC
  Administered 2021-01-12 – 2021-01-13 (×2): 15 mg via ORAL
  Filled 2021-01-12 (×2): qty 1

## 2021-01-12 MED ORDER — OXYCODONE-ACETAMINOPHEN 5-325 MG PO TABS
1.0000 | ORAL_TABLET | ORAL | Status: DC | PRN
Start: 2021-01-12 — End: 2021-01-12
  Administered 2021-01-12: 1 via ORAL
  Filled 2021-01-12: qty 1

## 2021-01-12 MED ORDER — OXYCODONE-ACETAMINOPHEN 5-325 MG PO TABS
2.0000 | ORAL_TABLET | ORAL | Status: DC | PRN
  Administered 2021-01-12 – 2021-01-13 (×5): 2 via ORAL
  Filled 2021-01-12 (×5): qty 2

## 2021-01-12 MED ORDER — ROSUVASTATIN CALCIUM 5 MG PO TABS
5.0000 mg | ORAL_TABLET | Freq: Every day | ORAL | Status: DC
  Administered 2021-01-14 – 2021-01-18 (×5): 5 mg via ORAL
  Filled 2021-01-12 (×7): qty 1

## 2021-01-12 MED ORDER — LATANOPROST 0.005 % OP SOLN
1.0000 [drp] | Freq: Every day | OPHTHALMIC | Status: DC
  Administered 2021-01-12 – 2021-01-17 (×6): 1 [drp] via OPHTHALMIC
  Filled 2021-01-12: qty 2.5

## 2021-01-12 MED ORDER — OXYCODONE-ACETAMINOPHEN 5-325 MG PO TABS: 1.0000 | ORAL_TABLET | ORAL | Status: DC | PRN

## 2021-01-12 MED ORDER — FENTANYL CITRATE (PF) 100 MCG/2ML IJ SOLN
50.0000 ug | INTRAMUSCULAR | Status: DC | PRN
  Administered 2021-01-12: 50 ug via INTRAVENOUS
  Filled 2021-01-12: qty 2

## 2021-01-12 MED ORDER — BUPROPION HCL 75 MG PO TABS
75.0000 mg | ORAL_TABLET | Freq: Every day | ORAL | Status: DC
  Administered 2021-01-12 – 2021-01-18 (×7): 75 mg via ORAL
  Filled 2021-01-12 (×7): qty 1

## 2021-01-12 MED ORDER — ENOXAPARIN SODIUM 40 MG/0.4ML ~~LOC~~ SOLN
40.0000 mg | SUBCUTANEOUS | Status: DC
  Administered 2021-01-12 – 2021-01-18 (×7): 40 mg via SUBCUTANEOUS
  Filled 2021-01-12 (×6): qty 0.4

## 2021-01-12 MED ORDER — EZETIMIBE 10 MG PO TABS
10.0000 mg | ORAL_TABLET | Freq: Every day | ORAL | Status: DC
  Administered 2021-01-12 – 2021-01-18 (×7): 10 mg via ORAL
  Filled 2021-01-12 (×6): qty 1

## 2021-01-12 MED ORDER — DONEPEZIL HCL 10 MG PO TABS
5.0000 mg | ORAL_TABLET | Freq: Every day | ORAL | Status: DC
  Administered 2021-01-12 – 2021-01-17 (×5): 5 mg via ORAL
  Filled 2021-01-12 (×6): qty 1

## 2021-01-12 MED ORDER — FENTANYL CITRATE (PF) 100 MCG/2ML IJ SOLN
25.0000 ug | INTRAMUSCULAR | Status: DC | PRN
  Administered 2021-01-12: 25 ug via INTRAVENOUS
  Filled 2021-01-12: qty 2

## 2021-01-12 MED ORDER — FUROSEMIDE 10 MG/ML IJ SOLN
20.0000 mg | Freq: Once | INTRAMUSCULAR | Status: AC
  Administered 2021-01-12: 20 mg via INTRAVENOUS
  Filled 2021-01-12: qty 2

## 2021-01-12 MED ORDER — POLYETHYLENE GLYCOL 3350 17 G PO PACK
17.0000 g | PACK | Freq: Every day | ORAL | Status: DC
  Administered 2021-01-12 – 2021-01-13 (×2): 17 g via ORAL
  Filled 2021-01-12 (×2): qty 1

## 2021-01-12 MED ORDER — LACTATED RINGERS IV SOLN: INTRAVENOUS | Status: DC

## 2021-01-12 MED ORDER — ACETAMINOPHEN 325 MG PO TABS: 650.0000 mg | ORAL_TABLET | Freq: Four times a day (QID) | ORAL | Status: DC | PRN

## 2021-01-12 MED ORDER — ALBUTEROL SULFATE HFA 108 (90 BASE) MCG/ACT IN AERS: 1.0000 | INHALATION_SPRAY | Freq: Four times a day (QID) | RESPIRATORY_TRACT | Status: DC | PRN

## 2021-01-12 MED ORDER — ONDANSETRON HCL 4 MG/2ML IJ SOLN
4.0000 mg | Freq: Four times a day (QID) | INTRAMUSCULAR | Status: DC | PRN
  Filled 2021-01-12: qty 2

## 2021-01-12 MED ORDER — CLOPIDOGREL BISULFATE 75 MG PO TABS
75.0000 mg | ORAL_TABLET | Freq: Every day | ORAL | Status: DC
Start: 2021-01-12 — End: 2021-01-19
  Administered 2021-01-12 – 2021-01-18 (×7): 75 mg via ORAL
  Filled 2021-01-12 (×7): qty 1

## 2021-01-12 MED ORDER — LEVOTHYROXINE SODIUM 137 MCG PO TABS
68.5000 ug | ORAL_TABLET | Freq: Every day | ORAL | Status: DC
  Administered 2021-01-12 – 2021-01-18 (×7): 68.5 ug via ORAL
  Filled 2021-01-12 (×8): qty 0.5

## 2021-01-12 MED ORDER — ORAL CARE MOUTH RINSE
15.0000 mL | Freq: Two times a day (BID) | OROMUCOSAL | Status: DC
  Administered 2021-01-12 – 2021-01-18 (×13): 15 mL via OROMUCOSAL

## 2021-01-12 MED ORDER — PANTOPRAZOLE SODIUM 40 MG PO TBEC
40.0000 mg | DELAYED_RELEASE_TABLET | Freq: Every day | ORAL | Status: DC
  Administered 2021-01-12 – 2021-01-18 (×7): 40 mg via ORAL
  Filled 2021-01-12 (×7): qty 1

## 2021-01-12 MED ORDER — RISPERIDONE 0.25 MG PO TABS
0.5000 mg | ORAL_TABLET | Freq: Every day | ORAL | Status: DC
  Administered 2021-01-12 – 2021-01-17 (×6): 0.5 mg via ORAL
  Filled 2021-01-12 (×6): qty 2

## 2021-01-12 MED ORDER — DORZOLAMIDE HCL-TIMOLOL MAL 2-0.5 % OP SOLN
1.0000 [drp] | Freq: Two times a day (BID) | OPHTHALMIC | Status: DC
  Administered 2021-01-12 – 2021-01-17 (×12): 1 [drp] via OPHTHALMIC
  Filled 2021-01-12: qty 10

## 2021-01-12 NOTE — Evaluation (Signed)
Physical Therapy Evaluation Patient Details Name: Sheila Mccoy MRN: [id number] DOB: [birth year] Today's Date: 01/12/2021   History of Present Illness  Pt is 87 yo female admitted s/p fall, chest wall pain, with mildly displaced 8th R rib fx.  PMH includes hypothyroidism, anxiety , depression, CHF, pulmonary fibrosis on 2 L O2 at home  Clinical Impression  Pt admitted with above diagnosis. Pt was limited due to weakness from fall and pain from R ribs.  She required mod A transfers and min A to ambulate 4' with rollator.  She is from ILF but does have assist with ADLs from family and ambulates with rollator at baseline.  Pt refuses SNF due to bad experiences in the past. Pt currently will need assist with all transfers and ambulation if returning home - if facility can provide this assist and therapy could return home. Pt currently with functional limitations due to the deficits listed below (see PT Problem List). Pt will benefit from skilled PT to increase their independence and safety with mobility to allow discharge to the venue listed below.       Follow Up Recommendations Other (comment) (Pt from ILF and does not want SNF; from PT perspective would need SNF level of care (assist with all transfers and therapy) - if facility can provide this then could return to her facility.)    Equipment Recommendations  None recommended by PT    Recommendations for Other Services       Precautions / Restrictions Precautions Precautions: Fall      Mobility  Bed Mobility Overal bed mobility: Needs Assistance Bed Mobility: Rolling;Supine to Sit Rolling: Mod assist   Supine to sit: Mod assist;HOB elevated     General bed mobility comments: HOB elevated and use of rails; assist for legs and mod A to lift trunk due to pain    Transfers Overall transfer level: Needs assistance Equipment used: 4-wheeled walker Transfers: Sit to/from Stand Sit to Stand: Min assist;From elevated surface          General transfer comment: Cues for safe hand placement  Ambulation/Gait Ambulation/Gait assistance: Min assist Gait Distance (Feet): 3 Feet Assistive device: 4-wheeled walker Gait Pattern/deviations: Step-to pattern;Decreased stride length;Trunk flexed Gait velocity: decreased   General Gait Details: min A to steady; limited due to Public librarian    Modified Rankin (Stroke Patients Only)       Balance Overall balance assessment: Needs assistance Sitting-balance support: Bilateral upper extremity supported Sitting balance-Leahy Scale: Poor Sitting balance - Comments: requiring UE support due to pain   Standing balance support: Bilateral upper extremity supported Standing balance-Leahy Scale: Poor Standing balance comment: required Rollator                             Pertinent Vitals/Pain Pain Assessment: 0-10 Pain Score: 4  Pain Location: R side/chest Pain Descriptors / Indicators: Discomfort Pain Intervention(s): Limited activity within patient's tolerance;Monitored during session;Repositioned    Home Living Family/patient expects to be discharged to:: Unsure (From independent living facility) Living Arrangements: Alone Available Help at Discharge: Family;Available PRN/intermittently Type of Home: Independent living facility Home Access: Level entry     Home Layout: One level Home Equipment: Walker - 4 wheels;Shower seat;Grab bars - tub/shower;Grab bars - toilet Additional Comments: wears 2 L O2 at baseline    Prior Function Level of Independence: Needs assistance   Gait /  Transfers Assistance Needed: Uses rollator; can ambulate out to a car or to dining hall  ADL's / Homemaking Assistance Needed: Family assist with showers and dressing; pt can do toileting ADLs  Comments: Facility assist with driving, meal; family assist with meds and ADLs above     Hand Dominance   Dominant Hand: Right     Extremity/Trunk Assessment   Upper Extremity Assessment Upper Extremity Assessment: Overall WFL for tasks assessed (not fully assessed due to pain)    Lower Extremity Assessment Lower Extremity Assessment: Overall WFL for tasks assessed    Cervical / Trunk Assessment Cervical / Trunk Assessment: Kyphotic  Communication   Communication: HOH  Cognition Arousal/Alertness: Awake/alert Behavior During Therapy: WFL for tasks assessed/performed Overall Cognitive Status: Within Functional Limits for tasks assessed                                        General Comments General comments (skin integrity, edema, etc.): Pt educated on PT role , POC< and recommendations.  While in room , daughter called and reports pt's ILF is sending nurse to assess pt's ability to return. NOtified daughter PT also helps in this (at that time pt still in bed).  After seeing pt move, discussed with pt she would likely need SNF level of care - would need assist every time she got up.  Pt refusing SNF due to bad experiences in past.  Discussed if not SNF would need to see how much assist facility and family can provide.  Will need assist every time she gets up.    Exercises Other Exercises Other Exercises: Pt performing incentive spirometer to nearly 700 mL x 10 (without cues)   Assessment/Plan    PT Assessment Patient needs continued PT services  PT Problem List Decreased strength;Decreased mobility;Decreased safety awareness;Decreased range of motion;Decreased activity tolerance;Cardiopulmonary status limiting activity;Decreased balance;Decreased knowledge of use of DME;Pain       PT Treatment Interventions DME instruction;Therapeutic activities;Modalities;Gait training;Therapeutic exercise;Patient/family education;Balance training;Functional mobility training    PT Goals (Current goals can be found in the Care Plan section)  Acute Rehab PT Goals Patient Stated Goal: return home PT Goal  Formulation: With patient Time For Goal Achievement: 01/26/21 Potential to Achieve Goals: Good    Frequency Min 3X/week   Barriers to discharge Decreased caregiver support      Co-evaluation               AM-PAC PT "6 Clicks" Mobility  Outcome Measure Help needed turning from your back to your side while in a flat bed without using bedrails?: A Little Help needed moving from lying on your back to sitting on the side of a flat bed without using bedrails?: A Lot Help needed moving to and from a bed to a chair (including a wheelchair)?: A Little Help needed standing up from a chair using your arms (e.g., wheelchair or bedside chair)?: A Little Help needed to walk in hospital room?: A Little Help needed climbing 3-5 steps with a railing? : A Lot 6 Click Score: 16    End of Session Equipment Utilized During Treatment: Gait belt;Oxygen (gait belt high) Activity Tolerance: Patient limited by pain Patient left: with chair alarm set;in chair;with call bell/phone within reach (pillows to support and under buttock) Nurse Communication: Mobility status PT Visit Diagnosis: Unsteadiness on feet (R26.81);History of falling (Z91.81);Muscle weakness (generalized) (M62.81);Pain Pain - Right/Left: Right Pain -  part of body:  (chest)    Time: 1020-1050 PT Time Calculation (min) (ACUTE ONLY): 30 min   Charges:   PT Evaluation $PT Eval Low Complexity: 1 Low PT Treatments $Therapeutic Activity: 8-22 mins        Anise Salvo, PT Acute Rehab Services Pager [phone] [facility] [phone]    Rayetta Humphrey 01/12/2021, 11:06 AM

## 2021-01-12 NOTE — Progress Notes (Signed)
Handbook given to patient.  

## 2021-01-12 NOTE — Progress Notes (Signed)
EKG performed. Results showed NSR in some places and right bundle branch block in other places. Results placed in patient's chart.

## 2021-01-12 NOTE — TOC Initial Note (Signed)
Transition of Care [facility]) - Initial/Assessment Note   Patient Details  Name: Sheila Mccoy MRN: [id number] Date of Birth: 1930/11/23  [facility] of [facility]) CM/SW Contact:    Ewing Schlein, LCSW Phone Number: 01/12/2021, 12:30 PM  Clinical Narrative: Patient is an 87 year female who was admitted for traumatic closed fracture of one rib of right side with minimal displacement. PT evaluation recommended SNF, but patient and family would prefer patient go to [facility] ALF [facility]) as there is PT in-house. Patient normally resides in an independent living facility. Family's plan is for patient to return to her ILF after PT at the ALF.  CSW received verbal consent to provide H&P and medication list to [place] with [facility] Per Gershon Cull, patient will also need a negative COVID test, discharge summary, and QuantiFERON-TB Gold prior to admission on Monday.  CSW completed FL2. FL2 faxed to Clearence Cheek with [facility] [phone]). TOC to follow.  Expected Discharge Plan: Assisted Living Barriers to Discharge: Continued Medical Work up  Patient Goals and CMS Choice Patient states their goals for this hospitalization and ongoing recovery are:: Discharge to [facility] ALF CMS Medicare.gov Compare Post Acute Care list provided to:: Patient Represenative (must comment) Choice offered to / list presented to : Adult Children,Patient  Expected Discharge Plan and Services Expected Discharge Plan: Assisted Living In-house Referral: Clinical Social Work Living arrangements for the past 2 months: Independent Living Facility              DME Arranged: N/A DME Agency: NA  Prior Living Arrangements/Services Living arrangements for the past 2 months: Independent Living Facility Lives with:: Self Patient language and need for interpreter reviewed:: Yes Do you feel safe going back to the place where you live?: Yes      Need for Family Participation in  Patient Care: Yes (Comment) Care giver support system in place?: Yes (comment) Criminal Activity/Legal Involvement Pertinent to Current Situation/Hospitalization: No - Comment as needed  Activities of Daily Living Home Assistive Devices/Equipment: Oxygen,Grab bars in shower,Grab bars around toilet,Eyeglasses,Walker (specify type) ADL Screening (condition at time of admission) Patient's cognitive ability adequate to safely complete daily activities?: No Is the patient deaf or have difficulty hearing?: No Does the patient have difficulty seeing, even when wearing glasses/contacts?: No Does the patient have difficulty concentrating, remembering, or making decisions?: No Patient able to express need for assistance with ADLs?: Yes Does the patient have difficulty dressing or bathing?: Yes Independently performs ADLs?: No Communication: Independent Dressing (OT): Needs assistance Is this a change from baseline?: Change from baseline, expected to last >3 days Grooming: Independent Feeding: Independent Bathing: Needs assistance Is this a change from baseline?: Change from baseline, expected to last >3 days Toileting: Needs assistance Is this a change from baseline?: Change from baseline, expected to last >3days In/Out Bed: Needs assistance Is this a change from baseline?: Change from baseline, expected to last >3 days Walks in Home: Needs assistance Is this a change from baseline?: Change from baseline, expected to last >3 days Does the patient have difficulty walking or climbing stairs?: Yes Weakness of Legs: Both Weakness of Arms/Hands: None  Permission Sought/Granted Permission sought to share information with : Oceanographer granted to share information with : Yes, Verbal Permission Granted Permission granted to share info w AGENCY: [facility])  Emotional Assessment Appearance:: Appears stated age Attitude/Demeanor/Rapport:  Lethargic Orientation: : Oriented to Self,Oriented to  Time,Oriented to Situation Alcohol / Substance Use:  Not Applicable Psych Involvement: No (comment)  Admission diagnosis:  Closed rib fracture [S22.39XA] Fall, initial encounter [W19.XXXA] Closed fracture of one rib of right side, initial encounter [S22.31XA] Patient Active Problem List   Diagnosis Date Noted  . Chronic respiratory failure with hypoxia (HCC) 01/12/2021  . Chronic diastolic CHF (congestive heart failure) (HCC) 01/12/2021  . GERD without esophagitis 01/12/2021  . Traumatic closed fracture of one rib of right side with minimal displacement, initial encounter 01/11/2021  . Memory difficulty 01/02/2021  . Hypothyroidism 09/30/2020  . Depression 09/30/2020  . TIA (transient ischemic attack) 09/29/2020  . Anxiety 07/12/2020  . Adnexal cyst 07/09/2020   PCP:  Caesar Bookman, NP Pharmacy:   [facility] [address] [place] - [address] [place] [address] Phone: [phone] Fax: [phone]  Readmission Risk Interventions No flowsheet data found.

## 2021-01-12 NOTE — Progress Notes (Addendum)
[facility] PROGRESS NOTE    Progress Note  Sheila Mccoy  KZS:[id number] DOB: Feb 03, 1931 DOA: 01/11/2021 PCP: Caesar Bookman, NP     Brief Narrative:   Sheila Mccoy is an 87 y.o. female past medical history of hypothyroidism, anxiety depression, chronic diastolic heart failure with a 2D echo in 2021, chronic respiratory failure on 2 L of oxygen due to pulmonary fibrosis continue to [facility] long hospital due to mechanical fall chest wall pain, she relates she fell on her right chest wall which subsequently led to right chest pain.  Significant studies: 12/11/2020 chest x-ray revealed rib fracture mildly displaced with no pneumothorax or effusion.  Antibiotics: None  Microbiology data: Blood culture:  Procedures: None  Assessment/Plan:   Traumatic closed fracture of one rib of right side with minimal displacement, initial encounter Mechanical fall.  Due to her restrictive breathing she is at risk for developing pneumonia. She was started on narcotics continue incentive spirometry. Physical therapy evaluation is pending.  Hypothyroidism: Continue Synthroid.  Chronic respiratory failure with hypoxia: According to previous PFTs pulmonary critical care does not think she has COPD. Acute pulmonary fibrosis continue oxygen supplementation.  Chronic diastolic heart failure: She appears euvolemic on physical exam continue current home medications. She takes Lasix 20 mg on Mondays and Thursdays  Depression Continue Risperdal lupine.  GERD without esophagitis: Continue PPI   DVT prophylaxis: lovenxo Family Communication:none Status is: Observation  The patient remains OBS appropriate and will d/c before 2 midnights.  Dispo: The patient is from: Home              Anticipated d/c is to: Home              Patient currently is not medically stable to d/c.   Difficult to place patient No  Code Status:     Code Status Orders  (From admission, onward)          Start     Ordered   01/12/21 0428  Do not attempt resuscitation (DNR)  Continuous       Question Answer Comment  In the event of cardiac or respiratory ARREST Do not call a "code blue"   In the event of cardiac or respiratory ARREST Do not perform Intubation, CPR, defibrillation or ACLS   In the event of cardiac or respiratory ARREST Use medication by any route, position, wound care, and other measures to relive pain and suffering. May use oxygen, suction and manual treatment of airway obstruction as needed for comfort.      01/12/21 0429        Code Status History    Date Active Date Inactive Code Status Order ID Comments User Context   09/30/2020 0052 10/02/2020 2104 DNR [id number]  Synetta Fail, MD Inpatient   07/09/2020 2223 07/13/2020 2206 DNR [id number]  Therisa Doyne, MD Inpatient   Advance Care Planning Activity        IV Access:    Peripheral IV   Procedures and diagnostic studies:   DG Ribs Unilateral W/Chest Right  Result Date: 01/11/2021 CLINICAL DATA:  Fall, back pain EXAM: RIGHT RIBS AND CHEST - 3+ VIEW COMPARISON:  07/11/2020 FINDINGS: Lateral right 8th rib fracture, mildly displaced. No pneumothorax or effusion. No confluent airspace opacities. Heart is normal size. IMPRESSION: Right lateral 8th rib fracture. No effusion or pneumothorax. Electronically Signed   By: Charlett Nose M.D.   On: 01/11/2021 19:14     Medical Consultants:    None.   Subjective:  Sheila Mccoy relates her pain is not controlled still bothers her when she takes deep breath.  Objective:    Vitals:   01/11/21 2201 01/11/21 2300 01/12/21 0042 01/12/21 0439  BP: (!) 167/81 (!) 156/81 (!) 144/74 (!) 159/70  Pulse: (!) 59 62 64 66  Resp: 16 20 20 16   Temp:   97.6 F (36.4 C) 97.7 F (36.5 C)  TempSrc:   Oral   SpO2: 98% 96% 96% 95%  Weight:      Height:       SpO2: 95 % O2 Flow Rate (L/min): 3 L/min   Intake/Output Summary (Last 24 hours) at 01/12/2021  0732 Last data filed at 01/12/2021 0439 Gross per 24 hour  Intake 100 ml  Output 0 ml  Net 100 ml   Filed Weights   01/11/21 1754  Weight: 65.3 kg    Exam: General exam: In no acute distress. Respiratory system: Good air movement and clear to auscultation, still tender to palpation. Cardiovascular system: S1 & S2 heard, RRR. No JVD. Gastrointestinal system: Abdomen is nondistended, soft and nontender.  Extremities: No pedal edema. Skin: No rashes, lesions or ulcers Psychiatry: Judgement and insight appear normal. Mood & affect appropriate.    Data Reviewed:    Labs: Basic Metabolic Panel: Recent Labs  Lab 01/11/21 2101 01/11/21 2142  NA 132*  --   K 6.0* 4.5  CL 97*  --   CO2 27  --   GLUCOSE 128*  --   BUN 18  --   CREATININE 0.81  --   CALCIUM 9.7  --    GFR Estimated Creatinine Clearance: 43.7 mL/min (by C-G formula based on SCr of 0.81 mg/dL). Liver Function Tests: Recent Labs  Lab 01/11/21 2101  AST 64*  ALT 28  ALKPHOS 107  BILITOT 0.7  PROT 8.0  ALBUMIN 4.3   No results for input(s): LIPASE, AMYLASE in the last 168 hours. No results for input(s): AMMONIA in the last 168 hours. Coagulation profile Recent Labs  Lab 01/12/21 0703  INR 1.0   COVID-19 Labs  No results for input(s): DDIMER, FERRITIN, LDH, CRP in the last 72 hours.  Lab Results  Component Value Date   SARSCOV2NAA NEGATIVE 01/11/2021   SARSCOV2NAA NEGATIVE 09/29/2020   SARSCOV2NAA NEGATIVE 07/13/2020   SARSCOV2NAA NEGATIVE 07/10/2020    CBC: Recent Labs  Lab 01/11/21 2101  WBC 15.3*  NEUTROABS 12.3*  HGB 13.1  HCT 39.3  MCV 90.8  PLT 383   Cardiac Enzymes: No results for input(s): CKTOTAL, CKMB, CKMBINDEX, TROPONINI in the last 168 hours. BNP (last 3 results) No results for input(s): PROBNP in the last 8760 hours. CBG: No results for input(s): GLUCAP in the last 168 hours. D-Dimer: No results for input(s): DDIMER in the last 72 hours. Hgb A1c: No results for  input(s): HGBA1C in the last 72 hours. Lipid Profile: No results for input(s): CHOL, HDL, LDLCALC, TRIG, CHOLHDL, LDLDIRECT in the last 72 hours. Thyroid function studies: No results for input(s): TSH, T4TOTAL, T3FREE, THYROIDAB in the last 72 hours.  Invalid input(s): FREET3 Anemia work up: No results for input(s): VITAMINB12, FOLATE, FERRITIN, TIBC, IRON, RETICCTPCT in the last 72 hours. Sepsis Labs: Recent Labs  Lab 01/11/21 2101  WBC 15.3*   Microbiology Recent Results (from the past 240 hour(s))  Resp Panel by RT-PCR (Flu A&B, Covid) Nasopharyngeal Swab     Status: None   Collection Time: 01/11/21 10:56 PM   Specimen: Nasopharyngeal Swab; Nasopharyngeal(NP) swabs in vial transport  medium  Result Value Ref Range Status   SARS Coronavirus 2 by RT PCR NEGATIVE NEGATIVE Final    Comment: (NOTE) SARS-CoV-2 target nucleic acids are NOT DETECTED.  The SARS-CoV-2 RNA is generally detectable in upper respiratory specimens during the acute phase of infection. The lowest concentration of SARS-CoV-2 viral copies this assay can detect is 138 copies/mL. A negative result does not preclude SARS-Cov-2 infection and should not be used as the sole basis for treatment or other patient management decisions. A negative result may occur with  improper specimen collection/handling, submission of specimen other than nasopharyngeal swab, presence of viral mutation(s) within the areas targeted by this assay, and inadequate number of viral copies(<138 copies/mL). A negative result must be combined with clinical observations, patient history, and epidemiological information. The expected result is Negative.  Fact Sheet for Patients:  BloggerCourse.com  Fact Sheet for Healthcare Providers:  SeriousBroker.it  This test is no t yet approved or cleared by the [place] FDA and  has been authorized for detection and/or diagnosis of SARS-CoV-2  by FDA under an Emergency Use Authorization (EUA). This EUA will remain  in effect (meaning this test can be used) for the duration of the COVID-19 declaration under Section 564(b)(1) of the Act, 21 U.S.C.section 360bbb-3(b)(1), unless the authorization is terminated  or revoked sooner.       Influenza A by PCR NEGATIVE NEGATIVE Final   Influenza B by PCR NEGATIVE NEGATIVE Final    Comment: (NOTE) The Xpert Xpress SARS-CoV-2/FLU/RSV plus assay is intended as an aid in the diagnosis of influenza from Nasopharyngeal swab specimens and should not be used as a sole basis for treatment. Nasal washings and aspirates are unacceptable for Xpert Xpress SARS-CoV-2/FLU/RSV testing.  Fact Sheet for Patients: BloggerCourse.com  Fact Sheet for Healthcare Providers: SeriousBroker.it  This test is not yet approved or cleared by the [place] FDA and has been authorized for detection and/or diagnosis of SARS-CoV-2 by FDA under an Emergency Use Authorization (EUA). This EUA will remain in effect (meaning this test can be used) for the duration of the COVID-19 declaration under Section 564(b)(1) of the Act, 21 U.S.C. section 360bbb-3(b)(1), unless the authorization is terminated or revoked.  Performed at [facility]      Medications:   . buPROPion  75 mg Oral Daily  . clopidogrel  75 mg Oral Daily  . donepezil  5 mg Oral QHS  . dorzolamide-timolol  1 drop Left Eye BID  . enoxaparin (LOVENOX) injection  40 mg Subcutaneous Q24H  . fluticasone furoate-vilanterol  1 puff Inhalation Daily  . latanoprost  1 drop Left Eye QHS  . levothyroxine  68.5 mcg Oral Q0600  . lidocaine  1 patch Transdermal Q24H  . mouth rinse  15 mL Mouth Rinse BID  . mirtazapine  15 mg Oral QHS  . pantoprazole  40 mg Oral Daily  . risperiDONE  0.5 mg Oral QHS   Continuous Infusions:    LOS: 0 days   Marinda Elk  [facility]  01/12/2021, 7:32 AM

## 2021-01-12 NOTE — H&P (Signed)
History and Physical    Sheila Mccoy MRN:[id number] DOB: 08/03/1931 DOA: 01/11/2021  PCP: Ngetich, Dinah C, NP  Patient coming from: ALF   Chief Complaint:  Chief Complaint  Patient presents with  . Fall     HPI:    87-year-old female with past medical history of hypothyroidism, anxiety, depression, diastolic ingestive heart failure (Echo 09/2020 EF 60-65% with G2DD), chronic respiratory failure (on 2lpm via [place]) , pulmonary fibrosis,  gastroesophageal reflux disease who presents to [place] medical floor as a transfer from [facility] emergency department status post fall with right chest wall pain.  Patient explains that yesterday she was walking through her home when she accidentally tripped on her oxygen tubing.  Patient denies loss of consciousness that precipitated the fall.  Patient denies focal weakness.  She landed on her right chest resulting in sudden severe right chest wall pain.  Patient describes her pain is 10 of 10 in intensity, sharp in quality, nonradiating.  Pain is worse with inspiration or movement and improved with rest.  Pain persisted for several hours and the patient was eventually brought into the [facility] emergency department for evaluation.  Upon evaluation at [facility] emergency department rib x-ray revealed a lateral right eighth rib fracture with mild displacement without pneumothorax or effusion.  Patient received analgesics in the emergency department and despite this continued to have intractable pain.  Therefore the hospitalist group was called to assess the patient for admission to the hospital.  Review of Systems:   Review of Systems  Cardiovascular: Positive for chest pain.  Musculoskeletal: Positive for falls.  All other systems reviewed and are negative.   Past Medical History:  Diagnosis Date  . Acute metabolic encephalopathy 07/09/2020  . Adnexal cyst 07/09/2020  . Anxiety   . Bipolar disorder (HCC)   . COPD  (chronic obstructive pulmonary disease) (HCC)   . COPD (chronic obstructive pulmonary disease) (HCC)   . Depression   . History of CT scan 2021   Legs.  . Hypothyroidism 09/30/2020  . Low thyroid stimulating hormone (TSH) level   . Memory difficulty 01/02/2021  . Sacral fracture (HCC) 07/09/2020  . Thyroid disease   . TIA (transient ischemic attack) 09/29/2020  . UTI (urinary tract infection) 07/09/2020    Past Surgical History:  Procedure Laterality Date  . arm fracture surgery    . MRI  2021   Lungs.  . right shoulder      pins in right shoulder.     reports that she has never smoked. She has never used smokeless tobacco. She reports that she does not drink alcohol and does not use drugs.  Allergies  Allergen Reactions  . Crestor [Rosuvastatin]     Lower extremity weakness, gait instability and confusion  . Hydromorphone     Very Disoriented & Confused  . Lipitor [Atorvastatin]     Muscle paralysis.  . Macrobid [Nitrofurantoin] Other (See Comments)    tired  . Morphine And Related     Family History  Problem Relation Age of Onset  . Lung cancer Mother   . Cancer Sister   . Cancer Brother   . Cancer Sister   . Sarcoidosis Brother   . Leukemia Daughter      Prior to Admission medications   Medication Sig Start Date End Date Taking? Authorizing Provider  Acetaminophen (TYLENOL PO) Take by mouth.    [provider]  albuterol (VENTOLIN HFA) 108 (90 Base) MCG/ACT   inhaler Inhale 1-2 puffs into the lungs every 6 (six) hours as needed for wheezing or shortness of breath. 06/08/20   Bast, Traci A, NP  ALPRAZolam (XANAX) 0.5 MG tablet Take 2 tablets approximately 45 minutes prior to the MRI study, take a third tablet if needed. 01/02/21   Willis, Charles K, MD  budesonide-formoterol (SYMBICORT) 80-4.5 MCG/ACT inhaler Inhale 1 puff into the lungs 2 (two) times daily.    [provider]  buPROPion (WELLBUTRIN) 75 MG tablet Take 75 mg by mouth daily.  06/13/20    [provider]  Calcium-Magnesium-Vitamin D (CALCIUM MAGNESIUM PO) Take 1 capsule by mouth 2 (two) times daily as needed.    [provider]  Cholecalciferol (VITAMIN D3 PO) Take 1,000 mg by mouth 2 (two) times daily as needed.    [provider]  clopidogrel (PLAVIX) 75 MG tablet Take 1 tablet (75 mg total) by mouth daily. 12/28/20   Ngetich, Dinah C, NP  Coenzyme Q10 (CO Q 10 PO) Take 120 mg by mouth 2 (two) times daily as needed.    [provider]  CRANBERRY PO Take 200 mg by mouth 2 (two) times daily as needed.    [provider]  donepezil (ARICEPT) 5 MG tablet Take 1 tablet (5 mg total) by mouth at bedtime. 01/02/21   Willis, Charles K, MD  dorzolamide-timolol (COSOPT) 22.3-6.8 MG/ML ophthalmic solution Place 1 drop into the left eye 2 (two) times daily.    [provider]  furosemide (LASIX) 20 MG tablet Take 20 mg by mouth. Monday and Thursday.    [provider]  latanoprost (XALATAN) 0.005 % ophthalmic solution Place 1 drop into the left eye at bedtime.    [provider]  Levothyroxine Sodium 137 MCG CAPS Take 68.5 mcg by mouth daily before breakfast. 1/2 tablet on empty stomach 1 hour before a meal with glass of water.    [provider]  mirtazapine (REMERON) 15 MG tablet Take 15 mg by mouth at bedtime.  06/13/20   [provider]  Omega-3 Fatty Acids (OMEGA 3 PO) Take 1,300 mg by mouth daily.    [provider]  OVER THE COUNTER MEDICATION Take 1 drop by mouth daily. Vitamin B12    [provider]  pantoprazole (PROTONIX) 40 MG tablet Take 40 mg by mouth daily. 11/01/20   [provider]  risperiDONE (RISPERDAL) 0.5 MG tablet Take 0.5 mg by mouth at bedtime.  05/15/20   [provider]  VITAMIN E PO Take 300 mg by mouth 2 (two) times daily as needed.    [provider]    Physical Exam: Vitals:   01/11/21 2051 01/11/21 2201 01/11/21 2300 01/12/21 0042   BP: (!) 168/105 (!) 167/81 (!) 156/81 (!) 144/74  Pulse: 62 (!) 59 62 64  Resp: 16 16 20 20  Temp:    97.6 F (36.4 C)  TempSrc:    Oral  SpO2: 96% 98% 96% 96%  Weight:      Height:        Constitutional: Awake alert and oriented x3, patient is in distress due to pain. Skin: no rashes, no lesions, good skin turgor noted. Eyes: Pupils are equally reactive to light.  No evidence of scleral icterus or conjunctival pallor.  ENMT: Moist mucous membranes noted.  Posterior pharynx clear of any exudate or lesions.   Neck: normal, supple, no masses, no thyromegaly.  No evidence of jugular venous distension.   Respiratory: clear to auscultation   bilaterally, no wheezing, no crackles. Normal respiratory effort. No accessory muscle use.  Cardiovascular: Regular rate and rhythm, no murmurs / rubs / gallops. No extremity edema. 2+ pedal pulses. No carotid bruits.  Chest:   Nontender without crepitus or deformity.   Back:   Nontender without crepitus or deformity. Abdomen: Abdomen is soft and nontender.  No evidence of intra-abdominal masses.  Positive bowel sounds noted in all quadrants.   Musculoskeletal: No joint deformity upper and lower extremities. Good ROM, no contractures. Normal muscle tone.  Neurologic: CN 2-12 grossly intact. Sensation intact.  Patient moving all 4 extremities spontaneously.  Patient is following all commands.  Patient is responsive to verbal stimuli.   Psychiatric: Patient exhibits normal mood with appropriate affect.  Patient seems to possess insight as to their current situation.     Labs on Admission: I have personally reviewed following labs and imaging studies -   CBC: Recent Labs  Lab 01/11/21 2101  WBC 15.3*  NEUTROABS 12.3*  HGB 13.1  HCT 39.3  MCV 90.8  PLT 383   Basic Metabolic Panel: Recent Labs  Lab 01/11/21 2101 01/11/21 2142  NA 132*  --   K 6.0* 4.5  CL 97*  --   CO2 27  --   GLUCOSE 128*  --   BUN 18  --   CREATININE 0.81  --   CALCIUM  9.7  --    GFR: Estimated Creatinine Clearance: 43.7 mL/min (by C-G formula based on SCr of 0.81 mg/dL). Liver Function Tests: Recent Labs  Lab 01/11/21 2101  AST 64*  ALT 28  ALKPHOS 107  BILITOT 0.7  PROT 8.0  ALBUMIN 4.3   No results for input(s): LIPASE, AMYLASE in the last 168 hours. No results for input(s): AMMONIA in the last 168 hours. Coagulation Profile: No results for input(s): INR, PROTIME in the last 168 hours. Cardiac Enzymes: No results for input(s): CKTOTAL, CKMB, CKMBINDEX, TROPONINI in the last 168 hours. BNP (last 3 results) No results for input(s): PROBNP in the last 8760 hours. HbA1C: No results for input(s): HGBA1C in the last 72 hours. CBG: No results for input(s): GLUCAP in the last 168 hours. Lipid Profile: No results for input(s): CHOL, HDL, LDLCALC, TRIG, CHOLHDL, LDLDIRECT in the last 72 hours. Thyroid Function Tests: No results for input(s): TSH, T4TOTAL, FREET4, T3FREE, THYROIDAB in the last 72 hours. Anemia Panel: No results for input(s): VITAMINB12, FOLATE, FERRITIN, TIBC, IRON, RETICCTPCT in the last 72 hours. Urine analysis:    Component Value Date/Time   COLORURINE YELLOW 09/29/2020 1750   APPEARANCEUR CLEAR 09/29/2020 1750   LABSPEC 1.010 09/29/2020 1750   PHURINE 6.0 09/29/2020 1750   GLUCOSEU NEGATIVE 09/29/2020 1750   HGBUR NEGATIVE 09/29/2020 1750   BILIRUBINUR NEGATIVE 09/29/2020 1750   KETONESUR NEGATIVE 09/29/2020 1750   PROTEINUR NEGATIVE 09/29/2020 1750   NITRITE NEGATIVE 09/29/2020 1750   LEUKOCYTESUR NEGATIVE 09/29/2020 1750    Radiological Exams on Admission - Personally Reviewed: DG Ribs Unilateral W/Chest Right  Result Date: 01/11/2021 CLINICAL DATA:  Fall, back pain EXAM: RIGHT RIBS AND CHEST - 3+ VIEW COMPARISON:  07/11/2020 FINDINGS: Lateral right 8th rib fracture, mildly displaced. No pneumothorax or effusion. No confluent airspace opacities. Heart is normal size. IMPRESSION: Right lateral 8th rib fracture. No  effusion or pneumothorax. Electronically Signed   By: Kevin  Dover M.D.   On: 01/11/2021 19:14    EKG: Personally reviewed.  Rhythm is normal sinus rhythm with heart rate of 70 bpm.  No dynamic   ST segment changes appreciated.  Assessment/Plan Principal Problem:   Traumatic closed fracture of one rib of right side with minimal displacement, initial encounter   Patient presenting status post fall with severe right chest wall pain secondary to eighth rib fracture  Fall itself seems purely mechanical without any medical cause.  Patient's pain must be controlled otherwise patient will continue to splint her breathing and put her himself at risk of pneumonia.  As needed opiate-based analgesics have been ordered which will be titrated upwards as necessary to achieve pain control.  Incentive spirometry has been ordered and patient will be encouraged to use it regularly.  PT evaluation ordered for the morning.  Active Problems:   Hypothyroidism   Continue home regimen of Synthroid    Chronic respiratory failure with hypoxia (HCC)   Review of recent pulmonology notes in early February reveals that chronic respiratory failure is likely secondary to mild pulmonary fibrosis with superimposed diastolic congestive heart failure.  Pulmonology felt that after review of patient's PFTs, patient did not have COPD.  Continue to provide supplemental oxygen at 2 L/min which will be titrated as necessary     Chronic diastolic CHF (congestive heart failure) (HCC)   Notable peripheral edema on examination although patient seems nearly euvolemic.  Will provide a one-time dose of intravenous Lasix    Depression  Continuing home regimen of risperidone and bupropion    GERD without esophagitis   Continue home regimen of daily PPI.   Code Status:  DNR Family Communication: deferred   Status is: Observation  The patient remains OBS appropriate and will d/c before 2 midnights.  Dispo: The  patient is from: ALF              Anticipated d/c is to: ALF              Patient currently is not medically stable to d/c.   Difficult to place patient No        Placido Hangartner J Maddux First MD [facility] Pager [phone]  If 7PM-7AM, please contact night-coverage [email or web address] Use universal [place] password for that web site. If you do not have the password, please call the hospital operator.  01/12/2021, 4:31 AM    

## 2021-01-12 NOTE — Progress Notes (Deleted)
History and Physical    Sheila Mccoy VXB:[id number] DOB: 1931/05/13 DOA: 01/11/2021  PCP: Caesar Bookman, NP  Patient coming from: ALF   Chief Complaint:  Chief Complaint  Patient presents with  . Fall     HPI:    87 year old female with past medical history of hypothyroidism, anxiety, depression, diastolic ingestive heart failure (Echo 09/2020 EF 60-65% with G2DD), chronic respiratory failure (on 2lpm via [place]) , pulmonary fibrosis,  gastroesophageal reflux disease who presents to [facility] long medical floor as a transfer from med [facility] emergency department status post fall with right chest wall pain.  Patient explains that yesterday she was walking through her home when she accidentally tripped on her oxygen tubing.  Patient denies loss of consciousness that precipitated the fall.  Patient denies focal weakness.  She landed on her right chest resulting in sudden severe right chest wall pain.  Patient describes her pain is 10 of 10 in intensity, sharp in quality, nonradiating.  Pain is worse with inspiration or movement and improved with rest.  Pain persisted for several hours and the patient was eventually brought into the [facility] emergency department for evaluation.  Upon evaluation at [facility] emergency department rib x-ray revealed a lateral right eighth rib fracture with mild displacement without pneumothorax or effusion.  Patient received analgesics in the emergency department and despite this continued to have intractable pain.  Therefore the hospitalist group was called to assess the patient for admission to the hospital.  Review of Systems:   Review of Systems  Cardiovascular: Positive for chest pain.  Musculoskeletal: Positive for falls.  All other systems reviewed and are negative.   Past Medical History:  Diagnosis Date  . Acute metabolic encephalopathy 07/09/2020  . Adnexal cyst 07/09/2020  . Anxiety   . Bipolar disorder (HCC)   . COPD  (chronic obstructive pulmonary disease) (HCC)   . COPD (chronic obstructive pulmonary disease) (HCC)   . Depression   . History of CT scan 2021   Legs.  . Hypothyroidism 09/30/2020  . Low thyroid stimulating hormone (TSH) level   . Memory difficulty 01/02/2021  . Sacral fracture (HCC) 07/09/2020  . Thyroid disease   . TIA (transient ischemic attack) 09/29/2020  . UTI (urinary tract infection) 07/09/2020    Past Surgical History:  Procedure Laterality Date  . arm fracture surgery    . MRI  2021   Lungs.  . right shoulder      pins in right shoulder.     reports that she has never smoked. She has never used smokeless tobacco. She reports that she does not drink alcohol and does not use drugs.  Allergies  Allergen Reactions  . Crestor [Rosuvastatin]     Lower extremity weakness, gait instability and confusion  . Hydromorphone     Very Disoriented & Confused  . Lipitor [Atorvastatin]     Muscle paralysis.  Earma Reading [Nitrofurantoin] Other (See Comments)    tired  . Morphine And Related     Family History  Problem Relation Age of Onset  . Lung cancer Mother   . Cancer Sister   . Cancer Brother   . Cancer Sister   . Sarcoidosis Brother   . Leukemia Daughter      Prior to Admission medications   Medication Sig Start Date End Date Taking? Authorizing Provider  Acetaminophen (TYLENOL PO) Take by mouth.    [provider]  albuterol (VENTOLIN HFA) 108 (90 Base) MCG/ACT  inhaler Inhale 1-2 puffs into the lungs every 6 (six) hours as needed for wheezing or shortness of breath. 06/08/20   Janace ArisBast, Traci A, NP  ALPRAZolam Prudy Feeler(XANAX) 0.5 MG tablet Take 2 tablets approximately 45 minutes prior to the MRI study, take a third tablet if needed. 01/02/21   York SpanielWillis, Charles K, MD  budesonide-formoterol (SYMBICORT) 80-4.5 MCG/ACT inhaler Inhale 1 puff into the lungs 2 (two) times daily.    [provider]  buPROPion (WELLBUTRIN) 75 MG tablet Take 75 mg by mouth daily.  06/13/20    [provider]  Calcium-Magnesium-Vitamin D (CALCIUM MAGNESIUM PO) Take 1 capsule by mouth 2 (two) times daily as needed.    [provider]  Cholecalciferol (VITAMIN D3 PO) Take 1,000 mg by mouth 2 (two) times daily as needed.    [provider]  clopidogrel (PLAVIX) 75 MG tablet Take 1 tablet (75 mg total) by mouth daily. 12/28/20   Ngetich, Dinah C, NP  Coenzyme Q10 (CO Q 10 PO) Take 120 mg by mouth 2 (two) times daily as needed.    [provider]  CRANBERRY PO Take 200 mg by mouth 2 (two) times daily as needed.    [provider]  donepezil (ARICEPT) 5 MG tablet Take 1 tablet (5 mg total) by mouth at bedtime. 01/02/21   York SpanielWillis, Charles K, MD  dorzolamide-timolol (COSOPT) 22.3-6.8 MG/ML ophthalmic solution Place 1 drop into the left eye 2 (two) times daily.    [provider]  furosemide (LASIX) 20 MG tablet Take 20 mg by mouth. Monday and Thursday.    [provider]  latanoprost (XALATAN) 0.005 % ophthalmic solution Place 1 drop into the left eye at bedtime.    [provider]  Levothyroxine Sodium 137 MCG CAPS Take 68.5 mcg by mouth daily before breakfast. 1/2 tablet on empty stomach 1 hour before a meal with glass of water.    [provider]  mirtazapine (REMERON) 15 MG tablet Take 15 mg by mouth at bedtime.  06/13/20   [provider]  Omega-3 Fatty Acids (OMEGA 3 PO) Take 1,300 mg by mouth daily.    [provider]  OVER THE COUNTER MEDICATION Take 1 drop by mouth daily. Vitamin B12    [provider]  pantoprazole (PROTONIX) 40 MG tablet Take 40 mg by mouth daily. 11/01/20   [provider]  risperiDONE (RISPERDAL) 0.5 MG tablet Take 0.5 mg by mouth at bedtime.  05/15/20   [provider]  VITAMIN E PO Take 300 mg by mouth 2 (two) times daily as needed.    [provider]    Physical Exam: Vitals:   01/11/21 2051 01/11/21 2201 01/11/21 2300 01/12/21 0042   BP: (!) 168/105 (!) 167/81 (!) 156/81 (!) 144/74  Pulse: 62 (!) 59 62 64  Resp: 16 16 20 20   Temp:    97.6 F (36.4 C)  TempSrc:    Oral  SpO2: 96% 98% 96% 96%  Weight:      Height:        Constitutional: Awake alert and oriented x3, patient is in distress due to pain. Skin: no rashes, no lesions, good skin turgor noted. Eyes: Pupils are equally reactive to light.  No evidence of scleral icterus or conjunctival pallor.  ENMT: Moist mucous membranes noted.  Posterior pharynx clear of any exudate or lesions.   Neck: normal, supple, no masses, no thyromegaly.  No evidence of jugular venous distension.   Respiratory: clear to auscultation  bilaterally, no wheezing, no crackles. Normal respiratory effort. No accessory muscle use.  Cardiovascular: Regular rate and rhythm, no murmurs / rubs / gallops. No extremity edema. 2+ pedal pulses. No carotid bruits.  Chest:   Nontender without crepitus or deformity.   Back:   Nontender without crepitus or deformity. Abdomen: Abdomen is soft and nontender.  No evidence of intra-abdominal masses.  Positive bowel sounds noted in all quadrants.   Musculoskeletal: No joint deformity upper and lower extremities. Good ROM, no contractures. Normal muscle tone.  Neurologic: CN 2-12 grossly intact. Sensation intact.  Patient moving all 4 extremities spontaneously.  Patient is following all commands.  Patient is responsive to verbal stimuli.   Psychiatric: Patient exhibits normal mood with appropriate affect.  Patient seems to possess insight as to their current situation.     Labs on Admission: I have personally reviewed following labs and imaging studies -   CBC: Recent Labs  Lab 01/11/21 2101  WBC 15.3*  NEUTROABS 12.3*  HGB 13.1  HCT 39.3  MCV 90.8  PLT 383   Basic Metabolic Panel: Recent Labs  Lab 01/11/21 2101 01/11/21 2142  NA 132*  --   K 6.0* 4.5  CL 97*  --   CO2 27  --   GLUCOSE 128*  --   BUN 18  --   CREATININE 0.81  --   CALCIUM  9.7  --    GFR: Estimated Creatinine Clearance: 43.7 mL/min (by C-G formula based on SCr of 0.81 mg/dL). Liver Function Tests: Recent Labs  Lab 01/11/21 2101  AST 64*  ALT 28  ALKPHOS 107  BILITOT 0.7  PROT 8.0  ALBUMIN 4.3   No results for input(s): LIPASE, AMYLASE in the last 168 hours. No results for input(s): AMMONIA in the last 168 hours. Coagulation Profile: No results for input(s): INR, PROTIME in the last 168 hours. Cardiac Enzymes: No results for input(s): CKTOTAL, CKMB, CKMBINDEX, TROPONINI in the last 168 hours. BNP (last 3 results) No results for input(s): PROBNP in the last 8760 hours. HbA1C: No results for input(s): HGBA1C in the last 72 hours. CBG: No results for input(s): GLUCAP in the last 168 hours. Lipid Profile: No results for input(s): CHOL, HDL, LDLCALC, TRIG, CHOLHDL, LDLDIRECT in the last 72 hours. Thyroid Function Tests: No results for input(s): TSH, T4TOTAL, FREET4, T3FREE, THYROIDAB in the last 72 hours. Anemia Panel: No results for input(s): VITAMINB12, FOLATE, FERRITIN, TIBC, IRON, RETICCTPCT in the last 72 hours. Urine analysis:    Component Value Date/Time   COLORURINE YELLOW 09/29/2020 1750   APPEARANCEUR CLEAR 09/29/2020 1750   LABSPEC 1.010 09/29/2020 1750   PHURINE 6.0 09/29/2020 1750   GLUCOSEU NEGATIVE 09/29/2020 1750   HGBUR NEGATIVE 09/29/2020 1750   BILIRUBINUR NEGATIVE 09/29/2020 1750   KETONESUR NEGATIVE 09/29/2020 1750   PROTEINUR NEGATIVE 09/29/2020 1750   NITRITE NEGATIVE 09/29/2020 1750   LEUKOCYTESUR NEGATIVE 09/29/2020 1750    Radiological Exams on Admission - Personally Reviewed: DG Ribs Unilateral W/Chest Right  Result Date: 01/11/2021 CLINICAL DATA:  Fall, back pain EXAM: RIGHT RIBS AND CHEST - 3+ VIEW COMPARISON:  07/11/2020 FINDINGS: Lateral right 8th rib fracture, mildly displaced. No pneumothorax or effusion. No confluent airspace opacities. Heart is normal size. IMPRESSION: Right lateral 8th rib fracture. No  effusion or pneumothorax. Electronically Signed   By: Charlett Nose M.D.   On: 01/11/2021 19:14    EKG: Personally reviewed.  Rhythm is normal sinus rhythm with heart rate of 70 bpm.  No dynamic  ST segment changes appreciated.  Assessment/Plan Principal Problem:   Traumatic closed fracture of one rib of right side with minimal displacement, initial encounter   Patient presenting status post fall with severe right chest wall pain secondary to eighth rib fracture  Fall itself seems purely mechanical without any medical cause.  Patient's pain must be controlled otherwise patient will continue to splint her breathing and put her himself at risk of pneumonia.  As needed opiate-based analgesics have been ordered which will be titrated upwards as necessary to achieve pain control.  Incentive spirometry has been ordered and patient will be encouraged to use it regularly.  PT evaluation ordered for the morning.  Active Problems:   Hypothyroidism   Continue home regimen of Synthroid    Chronic respiratory failure with hypoxia [facility])   Review of recent pulmonology notes in early February reveals that chronic respiratory failure is likely secondary to mild pulmonary fibrosis with superimposed diastolic congestive heart failure.  Pulmonology felt that after review of patient's PFTs, patient did not have COPD.  Continue to provide supplemental oxygen at 2 L/min which will be titrated as necessary     Chronic diastolic CHF (congestive heart failure) (HCC)   Notable peripheral edema on examination although patient seems nearly euvolemic.  Will provide a one-time dose of intravenous Lasix    Depression  Continuing home regimen of risperidone and bupropion    GERD without esophagitis   Continue home regimen of daily PPI.   Code Status:  DNR Family Communication: deferred   Status is: Observation  The patient remains OBS appropriate and will d/c before 2 midnights.  Dispo: The  patient is from: ALF              Anticipated d/c is to: ALF              Patient currently is not medically stable to d/c.   Difficult to place patient No        Marinda Elk MD [facility] Pager [phone]  If 7PM-7AM, please contact night-coverage [email or web address] Use universal [place] password for that web site. If you do not have the password, please call the hospital operator.  01/12/2021, 4:31 AM

## 2021-01-12 NOTE — Plan of Care (Signed)
  Problem: Education: Goal: Knowledge of General Education information will improve Description Including pain rating scale, medication(s)/side effects and non-pharmacologic comfort measures Outcome: Progressing   

## 2021-01-12 NOTE — [place] FL2 (Signed)
[place] MEDICAID FL2 LEVEL OF CARE SCREENING TOOL     IDENTIFICATION  Patient Name: Sheila Mccoy Birthdate: 1931/05/09 Sex: female Admission Date (Current Location): 01/11/2021  [facility] and [place] Number:  Producer, television/film/video and Address:  [facility],  [address] [place], [place] [address]      Provider Number: [id number]  Attending Physician Name and Address:  Marinda Elk, MD  Relative Name and Phone Number:  Mertha Finders (daughter) Ph: [phone]    Current Level of Care: Hospital Recommended Level of Care: Assisted Living Facility Prior Approval Number:    Date Approved/Denied:   PASRR Number:    Discharge Plan: Other (Comment) [facility])    Current Diagnoses: Patient Active Problem List   Diagnosis Date Noted  . Chronic respiratory failure with hypoxia (HCC) 01/12/2021  . Chronic diastolic CHF (congestive heart failure) (HCC) 01/12/2021  . GERD without esophagitis 01/12/2021  . Traumatic closed fracture of one rib of right side with minimal displacement, initial encounter 01/11/2021  . Memory difficulty 01/02/2021  . Hypothyroidism 09/30/2020  . Depression 09/30/2020  . TIA (transient ischemic attack) 09/29/2020  . Anxiety 07/12/2020  . Adnexal cyst 07/09/2020    Orientation RESPIRATION BLADDER Height & Weight     Self,Time,Situation  O2 (3L/min) Incontinent Weight: 143 lb 15.4 oz (65.3 kg) Height:  5\' 3"  (160 cm)  BEHAVIORAL SYMPTOMS/MOOD NEUROLOGICAL BOWEL NUTRITION STATUS      Continent Diet (Heart healthy)  AMBULATORY STATUS COMMUNICATION OF NEEDS Skin   Extensive Assist Verbally Normal                       Personal Care Assistance Level of Assistance  Bathing,Feeding,Dressing Bathing Assistance: Maximum assistance Feeding assistance: Independent Dressing Assistance: Maximum assistance     Functional Limitations Info  Sight,Hearing,Speech Sight Info: Adequate Hearing Info: Adequate Speech  Info: Adequate    SPECIAL CARE FACTORS FREQUENCY                       Contractures Contractures Info: Not present    Additional Factors Info  Code Status,Allergies,Psychotropic Code Status Info: DNR Allergies Info: Crestor (Rosuvastatin); Hydromorphone; Lipitor (Atorvastatin); Macrobid (Nitrofurantoin); Morphine And Related Psychotropic Info: Remeron (mirtazapine); Risperdal (risperidone); Wellbutrin (bupropion)         Current Medications (01/12/2021):  This is the current hospital active medication list Current Facility-Administered Medications  Medication Dose Route Frequency Provider Last Rate Last Admin  . acetaminophen (TYLENOL) tablet 650 mg  650 mg Oral Q6H PRN Shalhoub, 03/14/2021, MD       Or  . acetaminophen (TYLENOL) suppository 650 mg  650 mg Rectal Q6H PRN Shalhoub, Deno Lunger, MD      . albuterol (VENTOLIN HFA) 108 (90 Base) MCG/ACT inhaler 1-2 puff  1-2 puff Inhalation Q6H PRN Shalhoub, Deno Lunger, MD      . buPROPion [facility]) tablet 75 mg  75 mg Oral Daily [facility], MD   75 mg at 01/12/21 03/14/21  . clopidogrel (PLAVIX) tablet 75 mg  75 mg Oral Daily Shalhoub, [id number], MD   75 mg at 01/12/21 0923  . donepezil (ARICEPT) tablet 5 mg  5 mg Oral QHS Shalhoub, 03/14/21, MD      . dorzolamide-timolol (COSOPT) 22.3-6.8 MG/ML ophthalmic solution 1 drop  1 drop Left Eye BID Shalhoub, Deno Lunger, MD   1 drop at 01/12/21 [phone]  . enoxaparin (LOVENOX) injection 40 mg  40 mg Subcutaneous  Q24H Marinda Elk, MD   40 mg at 01/12/21 7076  . ezetimibe (ZETIA) tablet 10 mg  10 mg Oral Daily Marinda Elk, MD   10 mg at 01/12/21 0932  . oxyCODONE-acetaminophen (PERCOCET/ROXICET) 5-325 MG per tablet 2 tablet  2 tablet Oral Q4H PRN Marinda Elk, MD   2 tablet at 01/12/21 0759   Or  . fentaNYL (SUBLIMAZE) injection 50 mcg  50 mcg Intravenous Q4H PRN Marinda Elk, MD      . fluticasone furoate-vilanterol (BREO ELLIPTA) 100-25 MCG/INH 1 puff  1 puff  Inhalation Daily Shalhoub, Deno Lunger, MD   1 puff at 01/12/21 0731  . latanoprost (XALATAN) 0.005 % ophthalmic solution 1 drop  1 drop Left Eye QHS Shalhoub, Deno Lunger, MD      . levothyroxine (SYNTHROID) tablet 68.5 mcg  68.5 mcg Oral Q0600 Marinda Elk, MD   68.5 mcg at 01/12/21 0626  . lidocaine (LIDODERM) 5 % 1 patch  1 patch Transdermal Q24H Shalhoub, Deno Lunger, MD   1 patch at 01/11/21 1909  . MEDLINE mouth rinse  15 mL Mouth Rinse BID Marinda Elk, MD   15 mL at 01/12/21 0923  . mirtazapine (REMERON) tablet 15 mg  15 mg Oral QHS Shalhoub, Deno Lunger, MD      . ondansetron [facility]) tablet 4 mg  4 mg Oral Q6H PRN Shalhoub, Deno Lunger, MD       Or  . ondansetron [facility]) injection 4 mg  4 mg Intravenous Q6H PRN Shalhoub, Deno Lunger, MD      . pantoprazole (PROTONIX) EC tablet 40 mg  40 mg Oral Daily Shalhoub, Deno Lunger, MD   40 mg at 01/12/21 0923  . polyethylene glycol (MIRALAX / GLYCOLAX) packet 17 g  17 g Oral Daily Marinda Elk, MD   17 g at 01/12/21 1518  . risperiDONE (RISPERDAL) tablet 0.5 mg  0.5 mg Oral QHS Shalhoub, Deno Lunger, MD      . rosuvastatin (CRESTOR) tablet 5 mg  5 mg Oral Daily Marinda Elk, MD         Discharge Medications: Please see discharge summary for a list of discharge medications.  Relevant Imaging Results:  Relevant Lab Results:   Additional Information SSN: [id number]  Ewing Schlein, LCSW

## 2021-01-12 NOTE — ED Notes (Signed)
Patient and Family Visitor both stated that due to previous surgeries to Right Arm, it is painful to assess BP on Right Arm. Other tasks such as PIV insertion do not cause issues or harm to patient if completed on Right Arm. Restricted Extremity Armband placed on R. Arm to avoid BP Measurements being taken on R. Arm.

## 2021-01-13 DIAGNOSIS — S2231XA Fracture of one rib, right side, initial encounter for closed fracture: Secondary | ICD-10-CM | POA: Diagnosis not present

## 2021-01-13 DIAGNOSIS — J9611 Chronic respiratory failure with hypoxia: Secondary | ICD-10-CM | POA: Diagnosis not present

## 2021-01-13 DIAGNOSIS — F32A Depression, unspecified: Secondary | ICD-10-CM | POA: Diagnosis not present

## 2021-01-13 DIAGNOSIS — I5032 Chronic diastolic (congestive) heart failure: Secondary | ICD-10-CM | POA: Diagnosis not present

## 2021-01-13 LAB — GLUCOSE, CAPILLARY: Glucose-Capillary: 105 mg/dL — ABNORMAL HIGH (ref 70–99)

## 2021-01-13 MED ORDER — ACETAMINOPHEN 500 MG PO TABS
1000.0000 mg | ORAL_TABLET | Freq: Three times a day (TID) | ORAL | Status: DC
  Administered 2021-01-13 – 2021-01-18 (×15): 1000 mg via ORAL
  Filled 2021-01-13 (×15): qty 2

## 2021-01-13 MED ORDER — SALINE SPRAY 0.65 % NA SOLN
1.0000 | NASAL | Status: DC | PRN
  Administered 2021-01-13: 1 via NASAL
  Filled 2021-01-13 (×2): qty 44

## 2021-01-13 MED ORDER — LORATADINE 10 MG PO TABS
10.0000 mg | ORAL_TABLET | Freq: Every day | ORAL | Status: DC
  Administered 2021-01-13 – 2021-01-18 (×6): 10 mg via ORAL
  Filled 2021-01-13 (×6): qty 1

## 2021-01-13 MED ORDER — FUROSEMIDE 20 MG PO TABS
20.0000 mg | ORAL_TABLET | Freq: Every day | ORAL | Status: DC
  Administered 2021-01-13 – 2021-01-16 (×4): 20 mg via ORAL
  Filled 2021-01-13 (×4): qty 1

## 2021-01-13 MED ORDER — POLYETHYLENE GLYCOL 3350 17 G PO PACK
17.0000 g | PACK | Freq: Two times a day (BID) | ORAL | Status: DC | PRN
  Administered 2021-01-15: 17 g via ORAL
  Filled 2021-01-13: qty 1

## 2021-01-13 MED ORDER — IPRATROPIUM-ALBUTEROL 0.5-2.5 (3) MG/3ML IN SOLN
3.0000 mL | RESPIRATORY_TRACT | Status: DC | PRN
  Administered 2021-01-16 – 2021-01-18 (×2): 3 mL via RESPIRATORY_TRACT
  Filled 2021-01-13 (×3): qty 3

## 2021-01-13 MED ORDER — DOCUSATE SODIUM 100 MG PO CAPS
100.0000 mg | ORAL_CAPSULE | Freq: Every day | ORAL | Status: DC
  Administered 2021-01-14 – 2021-01-18 (×5): 100 mg via ORAL
  Filled 2021-01-13 (×5): qty 1

## 2021-01-13 MED ORDER — GUAIFENESIN ER 600 MG PO TB12
600.0000 mg | ORAL_TABLET | Freq: Two times a day (BID) | ORAL | Status: DC
  Administered 2021-01-13 – 2021-01-18 (×10): 600 mg via ORAL
  Filled 2021-01-13 (×10): qty 1

## 2021-01-13 MED ORDER — FLUTICASONE PROPIONATE 50 MCG/ACT NA SUSP
1.0000 | Freq: Every day | NASAL | Status: DC
  Administered 2021-01-13 – 2021-01-18 (×6): 1 via NASAL
  Filled 2021-01-13: qty 16

## 2021-01-13 MED ORDER — OXYCODONE HCL 5 MG PO TABS
5.0000 mg | ORAL_TABLET | Freq: Four times a day (QID) | ORAL | Status: DC | PRN
  Administered 2021-01-13 – 2021-01-18 (×17): 5 mg via ORAL
  Filled 2021-01-13 (×18): qty 1

## 2021-01-13 MED ORDER — IPRATROPIUM-ALBUTEROL 0.5-2.5 (3) MG/3ML IN SOLN
RESPIRATORY_TRACT | Status: AC
  Administered 2021-01-13: 3 mL
  Filled 2021-01-13: qty 3

## 2021-01-13 MED ORDER — SENNOSIDES-DOCUSATE SODIUM 8.6-50 MG PO TABS
1.0000 | ORAL_TABLET | Freq: Two times a day (BID) | ORAL | Status: DC | PRN
  Administered 2021-01-15: 1 via ORAL
  Filled 2021-01-13: qty 1

## 2021-01-13 NOTE — Progress Notes (Signed)
PROGRESS NOTE  Sheila Mccoy ATF:[id number] DOB: [birth year]   PCP: Caesar Bookman, NP  Patient is from: ILF  DOA: 01/11/2021 LOS: 0  Chief complaints: Accidental fall and right chest pain  Brief Narrative / Interim history: 87 year old F with PMH of chronic pulmonary fibrosis/RF on 2 L, diastolic CHF, hypothyroidism and anxiety and depression brought to ED with right chest pain after she had accidental fall.  X-ray showed mildly displaced right eighth rib fracture without pneumothorax or effusion.  Therapy recommended SNF but patient and family refused SNF and opted for ALF.   Subjective: Seen and examined earlier this morning.  No major events overnight or this morning.  No complaints other than some nasal congestion.  She denies chest pain, nausea, vomiting or abdominal pain.  Patient's daughter allowed to ask questions over the phone during my encounter.  Objective: Vitals:   01/12/21 1811 01/12/21 2122 01/13/21 0445 01/13/21 0709  BP: (!) 108/58 (!) 147/67 129/68   Pulse: (!) 57 63 60   Resp: 16 18 17    Temp: 97.7 F (36.5 C) 97.7 F (36.5 C) 98 F (36.7 C)   TempSrc: Oral Oral Oral   SpO2: 96% 95% 95% 91%  Weight:      Height:        Intake/Output Summary (Last 24 hours) at 01/13/2021 1348 Last data filed at 01/13/2021 1026 Gross per 24 hour  Intake 450 ml  Output 1400 ml  Net -950 ml   Filed Weights   01/11/21 1754  Weight: 65.3 kg    Examination:  GENERAL: No apparent distress.  Nontoxic. HEENT: MMM.  Vision and hearing grossly intact.  NECK: Supple.  No apparent JVD.  RESP: 91% on 2 L.  No IWOB.  Bilateral rails. CVS:  RRR. Heart sounds normal.  ABD/GI/GU: BS+. Abd soft, NTND.  MSK/EXT:  Moves extremities. No apparent deformity.  1+ pitting edema bilaterally SKIN: no apparent skin lesion or wound NEURO: Awake, alert and oriented appropriately.  No apparent focal neuro deficit. PSYCH: Calm. Normal affect.   Procedures:  None  Microbiology  summarized: Influenza PCR and COVID-19 nonreactive.  Assessment & Plan: Accidental fall at independent living facility Traumatic closed fracture of one rib of right side with minimal displacement, initial encounter -Encourage incentive spirometry, OOB/PT/OT -Pain control regimen-scheduled Tylenol and as needed oxycodone and fentanyl based on severity -As needed MiraLAX and Senokot-S as needed for constipation  Pulmonary fibrosis/chronic hypoxic RF: Wears 2 L at baseline. -Encourage incentive spirometry as above -Continue home breathing treatments  Chronic diastolic heart failure: TTE in 09/2020 with LVEF of 60 to 65%, G2-DD.  Lately taking her Lasix daily due to leg swelling.  She has 1+ pitting edema bilaterally.  -Start p.o. Lasix 20 mg daily -Monitor fluid status, renal functions and electrolytes  Hypothyroidism: Stable Continue Synthroid.  Depression: Stable -Continue Risperdal, Remeron and Wellbutrin  GERD without esophagitis: -Continue PPI  Nasal congestion: Likely due to nasal cannula. -Saline nasal spray  Disposition: ALF after 4/11.  Ordered QuantiFERON gold as required by ALF.  Body mass index is 25.5 kg/m.         DVT prophylaxis:  enoxaparin (LOVENOX) injection 40 mg Start: 01/12/21 1000  Code Status: DNR/DNI Family Communication: Updated patient's daughter over the phone. Level of care: Med-Surg Status is: Observation  The patient remains OBS appropriate and will d/c before 2 midnights.  Dispo: The patient is from: ILF              Anticipated d/c  is to: ALF              Patient currently is medically stable to d/c.   Difficult to place patient No       Consultants:  None   Sch Meds:  Scheduled Meds: . buPROPion  75 mg Oral Daily  . clopidogrel  75 mg Oral Daily  . donepezil  5 mg Oral QHS  . dorzolamide-timolol  1 drop Left Eye BID  . enoxaparin (LOVENOX) injection  40 mg Subcutaneous Q24H  . ezetimibe  10 mg Oral Daily  .  fluticasone furoate-vilanterol  1 puff Inhalation Daily  . latanoprost  1 drop Left Eye QHS  . levothyroxine  68.5 mcg Oral Q0600  . lidocaine  1 patch Transdermal Q24H  . mouth rinse  15 mL Mouth Rinse BID  . mirtazapine  15 mg Oral QHS  . pantoprazole  40 mg Oral Daily  . polyethylene glycol  17 g Oral Daily  . risperiDONE  0.5 mg Oral QHS  . rosuvastatin  5 mg Oral Daily   Continuous Infusions: PRN Meds:.acetaminophen **OR** acetaminophen, albuterol, oxyCODONE-acetaminophen **OR** fentaNYL (SUBLIMAZE) injection, ondansetron **OR** ondansetron (ZOFRAN) IV, sodium chloride  Antimicrobials: Anti-infectives (From admission, onward)   None       I have personally reviewed the following labs and images: CBC: Recent Labs  Lab 01/11/21 2101 01/12/21 0936  WBC 15.3* 12.2*  NEUTROABS 12.3* 9.6*  HGB 13.1 12.9  HCT 39.3 40.2  MCV 90.8 93.3  PLT 383 344   BMP &GFR Recent Labs  Lab 01/11/21 2101 01/11/21 2142 01/12/21 0703  NA 132*  --  133*  K 6.0* 4.5 3.8  CL 97*  --  99  CO2 27  --  23  GLUCOSE 128*  --  122*  BUN 18  --  15  CREATININE 0.81  --  0.69  CALCIUM 9.7  --  9.2  MG  --   --  1.7   Estimated Creatinine Clearance: 44.2 mL/min (by C-G formula based on SCr of 0.69 mg/dL). Liver & Pancreas: Recent Labs  Lab 01/11/21 2101 01/12/21 0703  AST 64* 34  ALT 28 28  ALKPHOS 107 99  BILITOT 0.7 0.6  PROT 8.0 7.3  ALBUMIN 4.3 3.8   No results for input(s): LIPASE, AMYLASE in the last 168 hours. No results for input(s): AMMONIA in the last 168 hours. Diabetic: No results for input(s): HGBA1C in the last 72 hours. Recent Labs  Lab 01/13/21 0744  GLUCAP 105*   Cardiac Enzymes: No results for input(s): CKTOTAL, CKMB, CKMBINDEX, TROPONINI in the last 168 hours. No results for input(s): PROBNP in the last 8760 hours. Coagulation Profile: Recent Labs  Lab 01/12/21 0703  INR 1.0   Thyroid Function Tests: No results for input(s): TSH, T4TOTAL, FREET4,  T3FREE, THYROIDAB in the last 72 hours. Lipid Profile: No results for input(s): CHOL, HDL, LDLCALC, TRIG, CHOLHDL, LDLDIRECT in the last 72 hours. Anemia Panel: No results for input(s): VITAMINB12, FOLATE, FERRITIN, TIBC, IRON, RETICCTPCT in the last 72 hours. Urine analysis:    Component Value Date/Time   COLORURINE YELLOW 09/29/2020 1750   APPEARANCEUR CLEAR 09/29/2020 1750   LABSPEC 1.010 09/29/2020 1750   PHURINE 6.0 09/29/2020 1750   GLUCOSEU NEGATIVE 09/29/2020 1750   HGBUR NEGATIVE 09/29/2020 1750   BILIRUBINUR NEGATIVE 09/29/2020 1750   KETONESUR NEGATIVE 09/29/2020 1750   PROTEINUR NEGATIVE 09/29/2020 1750   NITRITE NEGATIVE 09/29/2020 1750   LEUKOCYTESUR NEGATIVE 09/29/2020 1750   Sepsis  Labs: Invalid input(s): PROCALCITONIN, LACTICIDVEN  Microbiology: Recent Results (from the past 240 hour(s))  Resp Panel by RT-PCR (Flu A&B, Covid) Nasopharyngeal Swab     Status: None   Collection Time: 01/11/21 10:56 PM   Specimen: Nasopharyngeal Swab; Nasopharyngeal(NP) swabs in vial transport medium  Result Value Ref Range Status   SARS Coronavirus 2 by RT PCR NEGATIVE NEGATIVE Final    Comment: (NOTE) SARS-CoV-2 target nucleic acids are NOT DETECTED.  The SARS-CoV-2 RNA is generally detectable in upper respiratory specimens during the acute phase of infection. The lowest concentration of SARS-CoV-2 viral copies this assay can detect is 138 copies/mL. A negative result does not preclude SARS-Cov-2 infection and should not be used as the sole basis for treatment or other patient management decisions. A negative result may occur with  improper specimen collection/handling, submission of specimen other than nasopharyngeal swab, presence of viral mutation(s) within the areas targeted by this assay, and inadequate number of viral copies(<138 copies/mL). A negative result must be combined with clinical observations, patient history, and epidemiological information. The expected  result is Negative.  Fact Sheet for Patients:  BloggerCourse.com  Fact Sheet for Healthcare Providers:  SeriousBroker.it  This test is no t yet approved or cleared by the [place] FDA and  has been authorized for detection and/or diagnosis of SARS-CoV-2 by FDA under an Emergency Use Authorization (EUA). This EUA will remain  in effect (meaning this test can be used) for the duration of the COVID-19 declaration under Section 564(b)(1) of the Act, 21 U.S.C.section 360bbb-3(b)(1), unless the authorization is terminated  or revoked sooner.       Influenza A by PCR NEGATIVE NEGATIVE Final   Influenza B by PCR NEGATIVE NEGATIVE Final    Comment: (NOTE) The Xpert Xpress SARS-CoV-2/FLU/RSV plus assay is intended as an aid in the diagnosis of influenza from Nasopharyngeal swab specimens and should not be used as a sole basis for treatment. Nasal washings and aspirates are unacceptable for Xpert Xpress SARS-CoV-2/FLU/RSV testing.  Fact Sheet for Patients: BloggerCourse.com  Fact Sheet for Healthcare Providers: SeriousBroker.it  This test is not yet approved or cleared by the [place] FDA and has been authorized for detection and/or diagnosis of SARS-CoV-2 by FDA under an Emergency Use Authorization (EUA). This EUA will remain in effect (meaning this test can be used) for the duration of the COVID-19 declaration under Section 564(b)(1) of the Act, 21 U.S.C. section 360bbb-3(b)(1), unless the authorization is terminated or revoked.  Performed at [facility]     Radiology Studies: No results found.    Francies Inch T. Abbi Mancini [facility]  If 7PM-7AM, please contact night-coverage [email or web address] 01/13/2021, 1:48 PM

## 2021-01-13 NOTE — Progress Notes (Signed)
Pt up in chair with two person max assist. Pt need guidance and direction with mobility. No needs at time of transfer.

## 2021-01-14 DIAGNOSIS — J9611 Chronic respiratory failure with hypoxia: Secondary | ICD-10-CM | POA: Diagnosis not present

## 2021-01-14 DIAGNOSIS — W19XXXA Unspecified fall, initial encounter: Secondary | ICD-10-CM

## 2021-01-14 DIAGNOSIS — R5381 Other malaise: Secondary | ICD-10-CM

## 2021-01-14 DIAGNOSIS — I5032 Chronic diastolic (congestive) heart failure: Secondary | ICD-10-CM | POA: Diagnosis not present

## 2021-01-14 DIAGNOSIS — S2231XA Fracture of one rib, right side, initial encounter for closed fracture: Secondary | ICD-10-CM | POA: Diagnosis not present

## 2021-01-14 LAB — RENAL FUNCTION PANEL
Albumin: 3.3 g/dL — ABNORMAL LOW (ref 3.5–5.0)
Anion gap: 7 (ref 5–15)
BUN: 19 mg/dL (ref 8–23)
CO2: 25 mmol/L (ref 22–32)
Calcium: 8.9 mg/dL (ref 8.9–10.3)
Chloride: 99 mmol/L (ref 98–111)
Creatinine, Ser: 0.51 mg/dL (ref 0.44–1.00)
GFR, Estimated: >60 mL/min (ref >60)
Glucose, Bld: 98 mg/dL (ref 70–99)
Phosphorus: 2.6 mg/dL (ref 2.5–4.6)
Potassium: 3.8 mmol/L (ref 3.5–5.1)
Sodium: 131 mmol/L — ABNORMAL LOW (ref 135–145)

## 2021-01-14 LAB — MAGNESIUM: Magnesium: 1.7 mg/dL (ref 1.7–2.4)

## 2021-01-14 MED ORDER — DICLOFENAC SODIUM 1 % EX GEL
2.0000 g | Freq: Four times a day (QID) | CUTANEOUS | Status: DC
  Administered 2021-01-14 – 2021-01-15 (×4): 2 g via TOPICAL
  Filled 2021-01-14: qty 100

## 2021-01-14 MED ORDER — MIRTAZAPINE 7.5 MG PO TABS
7.5000 mg | ORAL_TABLET | Freq: Every day | ORAL | Status: DC
  Administered 2021-01-14 – 2021-01-17 (×4): 7.5 mg via ORAL
  Filled 2021-01-14 (×5): qty 1

## 2021-01-14 NOTE — Progress Notes (Signed)
PT Cancellation Note  Patient Details Name: Sheila Mccoy MRN: [id number] DOB: 1931-08-16   Cancelled Treatment:    Reason Eval/Treat Not Completed: 2nd attempt to work with pt-nursing currently feeding pt. Will check back another time.    Faye Ramsay, PT Acute Rehabilitation  Office: [phone] Pager: [phone]

## 2021-01-14 NOTE — Progress Notes (Signed)
Received a call from lab saying that they cannot draw Quantiferon now. They said they would r/s for Monday AM.

## 2021-01-14 NOTE — Progress Notes (Signed)
Physical Therapy Treatment Patient Details Name: Sheila Mccoy MRN: [id number] DOB: 07-13-1931 Today's Date: 01/14/2021    History of Present Illness Pt is 87 yo female admitted s/p fall, chest wall pain, with mildly displaced 8th R rib fx.  PMH includes hypothyroidism, anxiety , depression, CHF, pulmonary fibrosis on 2 L O2 at home    PT Comments    After some discussion, pt was agreeable to working with PT. She actually requested to sit up in recliner instead of returning to bed. Max A for bed mobility and Min A to stand and walk a few steps. Remained on [place] O2. Continue to recommend ST SNF but per chart pt is not agreeable to placement. Will continue to follow and progress activity as tolerated.     Follow Up Recommendations  SNF;Supervision/Assistance - 24 hour (pt declines placement. Pt will need increased assistance/supervision and HHPT if she discharges home/ALF)     Equipment Recommendations  None recommended by PT    Recommendations for Other Services       Precautions / Restrictions Precautions Precautions: Fall Precaution Comments: R rib fx. O2 dep Restrictions Weight Bearing Restrictions: No    Mobility  Bed Mobility Overal bed mobility: Needs Assistance Bed Mobility: Supine to Sit     Supine to sit: Max assist;HOB elevated     General bed mobility comments: Assist for trunk and bil LEs. Utilized bedpad for scooting. Increased time. Cues for safety, technique.    Transfers Overall transfer level: Needs assistance Equipment used: Rolling walker (2 wheeled) Transfers: Sit to/from Stand Sit to Stand: Min assist;From elevated surface         General transfer comment: Assist to rise, steady, control descent. Increased time. Cues for safety, technique, hand placement. Sit to stand x 4 reps.  Ambulation/Gait Ambulation/Gait assistance: Min assist Gait Distance (Feet): 6 Feet Assistive device: Rolling walker (2 wheeled) Gait Pattern/deviations: Step-through  pattern;Decreased stride length;Trunk flexed     General Gait Details: Initially took several side steps towards HOB with RW. After resting a bit EOB, pt requested to take a few steps over to the recliner. Remained on [place] O2. Cues for safety, proper RW use.   Stairs             Wheelchair Mobility    Modified Rankin (Stroke Patients Only)       Balance Overall balance assessment: Needs assistance;History of Falls           Standing balance-Leahy Scale: Poor                              Cognition Arousal/Alertness: Awake/alert Behavior During Therapy: WFL for tasks assessed/performed Overall Cognitive Status: Within Functional Limits for tasks assessed                                        Exercises General Exercises - Lower Extremity Long Arc Quad: AROM;Both;10 reps;Seated    General Comments        Pertinent Vitals/Pain Pain Assessment: Faces Faces Pain Scale: Hurts even more Pain Location: R side Pain Descriptors / Indicators: Grimacing;Discomfort;Guarding Pain Intervention(s): Limited activity within patient's tolerance;Monitored during session;Repositioned    Home Living                      Prior Function  PT Goals (current goals can now be found in the care plan section) Progress towards PT goals: Progressing toward goals    Frequency    Min 3X/week      PT Plan Current plan remains appropriate    Co-evaluation              AM-PAC PT "6 Clicks" Mobility   Outcome Measure  Help needed turning from your back to your side while in a flat bed without using bedrails?: A Lot Help needed moving from lying on your back to sitting on the side of a flat bed without using bedrails?: A Lot Help needed moving to and from a bed to a chair (including a wheelchair)?: A Lot Help needed standing up from a chair using your arms (e.g., wheelchair or bedside chair)?: A Lot Help needed to walk in  hospital room?: A Lot Help needed climbing 3-5 steps with a railing? : Total 6 Click Score: 11    End of Session Equipment Utilized During Treatment: Gait belt;Oxygen Activity Tolerance: Patient limited by fatigue;Patient limited by pain Patient left: in chair;with call bell/phone within reach Nurse Communication: Mobility status (and need for new purewick) PT Visit Diagnosis: Muscle weakness (generalized) (M62.81);History of falling (Z91.81);Pain;Unsteadiness on feet (R26.81);Difficulty in walking, not elsewhere classified (R26.2)     Time: 9528-4132 PT Time Calculation (min) (ACUTE ONLY): 29 min  Charges:  $Gait Training: 8-22 mins $Therapeutic Activity: 8-22 mins                         Faye Ramsay, PT Acute Rehabilitation  Office: [phone] Pager: [phone]

## 2021-01-14 NOTE — Plan of Care (Signed)
  Problem: Safety: Goal: Ability to remain free from injury will improve Outcome: Progressing   

## 2021-01-14 NOTE — Progress Notes (Addendum)
PROGRESS NOTE  Sheila Mccoy KVQ:[id number] DOB: 11-02-1930   PCP: Caesar Bookman, NP  Patient is from: ILF  DOA: 01/11/2021 LOS: 0  Chief complaints: Accidental fall and right chest pain  Brief Narrative / Interim history: 87 year old F with PMH of chronic pulmonary fibrosis/RF on 2 L, diastolic CHF, hypothyroidism and anxiety and depression brought to ED with right chest pain after she had accidental fall.  X-ray showed mildly displaced right eighth rib fracture without pneumothorax or effusion.  Therapy recommended SNF but patient and family refused SNF and opted for ALF.   Subjective: Seen and examined earlier this morning.  She was somewhat sleepy but awakes to voice easily.  Complains back pain.  She rates her pain 10/10 but does not appear to be in that much distress.  She is somewhat groggy.  Denies shortness of breath or GI symptoms.  Objective: Vitals:   01/13/21 0709 01/13/21 1654 01/13/21 2256 01/14/21 0520  BP:   118/60 (!) 158/70  Pulse:   (!) 55 (!) 59  Resp:   16 16  Temp:   (!) 97.5 F (36.4 C) 98.2 F (36.8 C)  TempSrc:    Oral  SpO2: 91% 91% 97% 97%  Weight:      Height:        Intake/Output Summary (Last 24 hours) at 01/14/2021 1322 Last data filed at 01/14/2021 1309 Gross per 24 hour  Intake 1100 ml  Output 1400 ml  Net -300 ml   Filed Weights   01/11/21 1754  Weight: 65.3 kg    Examination:  GENERAL: No apparent distress.  Nontoxic. HEENT: MMM.  Vision and hearing grossly intact.  NECK: Supple.  No apparent JVD.  RESP: 97% on 3 L.  No IWOB.  Fair aeration bilaterally. CVS:  RRR. Heart sounds normal.  ABD/GI/GU: BS+. Abd soft, NTND.  MSK/EXT:  Moves extremities. No apparent deformity. No edema.  SKIN: no apparent skin lesion or wound NEURO: Somewhat sleepy but awakes to voice.  Fairly oriented.  No apparent focal neuro deficit. PSYCH: Calm. Normal affect.   Procedures:  None  Microbiology summarized: Influenza PCR and COVID-19  nonreactive.  Assessment & Plan: Accidental fall at independent living facility Traumatic closed fracture of one rib of right side with minimal displacement, initial encounter -Encourage incentive spirometry, OOB/PT/OT -Pain control regimen-scheduled Tylenol and topical Voltaren gel.   -As needed oxycodone and fentanyl based on severity of pain but may have to go by objective finding. -As needed MiraLAX and Senokot-S as needed for constipation  Pulmonary fibrosis/chronic hypoxic RF: Wears 2 L at baseline. -Encourage incentive spirometry and flutter valve as above -Continue home breathing treatments  Chronic diastolic heart failure: TTE in 09/2020 with LVEF of 60 to 65%, G2-DD.  Lately taking her Lasix daily due to leg swelling.  She has 1+ pitting edema bilaterally.  About 1.2 L UOP during the day yesterday.  Renal function and electrolytes stable. -Continue p.o. Lasix 20 mg daily -Monitor fluid status, renal functions and electrolytes  Hypothyroidism: Stable -Continue Synthroid.  Depression: Stable -Continue Risperdal and Wellbutrin -Decrease Remeron to 7.5 mg nightly-looks a sleepy this morning.  GERD without esophagitis: -Continue PPI  Nasal congestion: Likely due to nasal cannula. -Continue Claritin, Flonase and saline nasal spray  Addendum Hyperkalemia: Resolved.  Debility/physical deconditioning: Uses walker at baseline. -PT/OT  Disposition: ALF after 4/11.  Ordered QuantiFERON gold as required by ALF.  Body mass index is 25.5 kg/m.         DVT prophylaxis:  enoxaparin (LOVENOX) injection 40 mg Start: 01/12/21 1000  Code Status: DNR/DNI Family Communication: Updated patient's daughter over the phone on 4/9. Level of care: Med-Surg Status is: Observation  The patient remains OBS appropriate and will d/c before 2 midnights.  Dispo: The patient is from: ILF              Anticipated d/c is to: ALF              Patient currently is medically stable to  d/c.   Difficult to place patient No       Consultants:  None   Sch Meds:  Scheduled Meds: . acetaminophen  1,000 mg Oral Q8H  . buPROPion  75 mg Oral Daily  . clopidogrel  75 mg Oral Daily  . diclofenac Sodium  2 g Topical QID  . docusate sodium  100 mg Oral Daily  . donepezil  5 mg Oral QHS  . dorzolamide-timolol  1 drop Left Eye BID  . enoxaparin (LOVENOX) injection  40 mg Subcutaneous Q24H  . ezetimibe  10 mg Oral Daily  . fluticasone  1 spray Each Nare Daily  . fluticasone furoate-vilanterol  1 puff Inhalation Daily  . furosemide  20 mg Oral Daily  . guaiFENesin  600 mg Oral BID  . latanoprost  1 drop Left Eye QHS  . levothyroxine  68.5 mcg Oral Q0600  . lidocaine  1 patch Transdermal Q24H  . loratadine  10 mg Oral Daily  . mouth rinse  15 mL Mouth Rinse BID  . mirtazapine  7.5 mg Oral QHS  . pantoprazole  40 mg Oral Daily  . risperiDONE  0.5 mg Oral QHS  . rosuvastatin  5 mg Oral Daily   Continuous Infusions: PRN Meds:.[DISCONTINUED] oxyCODONE-acetaminophen **OR** fentaNYL (SUBLIMAZE) injection, ipratropium-albuterol, ondansetron **OR** ondansetron (ZOFRAN) IV, oxyCODONE, polyethylene glycol, senna-docusate, sodium chloride  Antimicrobials: Anti-infectives (From admission, onward)   None       I have personally reviewed the following labs and images: CBC: Recent Labs  Lab 01/11/21 2101 01/12/21 0936  WBC 15.3* 12.2*  NEUTROABS 12.3* 9.6*  HGB 13.1 12.9  HCT 39.3 40.2  MCV 90.8 93.3  PLT 383 344   BMP &GFR Recent Labs  Lab 01/11/21 2101 01/11/21 2142 01/12/21 0703 01/14/21 0733  NA 132*  --  133* 131*  K 6.0* 4.5 3.8 3.8  CL 97*  --  99 99  CO2 27  --  23 25  GLUCOSE 128*  --  122* 98  BUN 18  --  15 19  CREATININE 0.81  --  0.69 0.51  CALCIUM 9.7  --  9.2 8.9  MG  --   --  1.7 1.7  PHOS  --   --   --  2.6   Estimated Creatinine Clearance: 44.2 mL/min (by C-G formula based on SCr of 0.51 mg/dL). Liver & Pancreas: Recent Labs  Lab  01/11/21 2101 01/12/21 0703 01/14/21 0733  AST 64* 34  --   ALT 28 28  --   ALKPHOS 107 99  --   BILITOT 0.7 0.6  --   PROT 8.0 7.3  --   ALBUMIN 4.3 3.8 3.3*   No results for input(s): LIPASE, AMYLASE in the last 168 hours. No results for input(s): AMMONIA in the last 168 hours. Diabetic: No results for input(s): HGBA1C in the last 72 hours. Recent Labs  Lab 01/13/21 0744  GLUCAP 105*   Cardiac Enzymes: No results for input(s): CKTOTAL, CKMB, CKMBINDEX, TROPONINI  in the last 168 hours. No results for input(s): PROBNP in the last 8760 hours. Coagulation Profile: Recent Labs  Lab 01/12/21 0703  INR 1.0   Thyroid Function Tests: No results for input(s): TSH, T4TOTAL, FREET4, T3FREE, THYROIDAB in the last 72 hours. Lipid Profile: No results for input(s): CHOL, HDL, LDLCALC, TRIG, CHOLHDL, LDLDIRECT in the last 72 hours. Anemia Panel: No results for input(s): VITAMINB12, FOLATE, FERRITIN, TIBC, IRON, RETICCTPCT in the last 72 hours. Urine analysis:    Component Value Date/Time   COLORURINE YELLOW 09/29/2020 1750   APPEARANCEUR CLEAR 09/29/2020 1750   LABSPEC 1.010 09/29/2020 1750   PHURINE 6.0 09/29/2020 1750   GLUCOSEU NEGATIVE 09/29/2020 1750   HGBUR NEGATIVE 09/29/2020 1750   BILIRUBINUR NEGATIVE 09/29/2020 1750   KETONESUR NEGATIVE 09/29/2020 1750   PROTEINUR NEGATIVE 09/29/2020 1750   NITRITE NEGATIVE 09/29/2020 1750   LEUKOCYTESUR NEGATIVE 09/29/2020 1750   Sepsis Labs: Invalid input(s): PROCALCITONIN, LACTICIDVEN  Microbiology: Recent Results (from the past 240 hour(s))  Resp Panel by RT-PCR (Flu A&B, Covid) Nasopharyngeal Swab     Status: None   Collection Time: 01/11/21 10:56 PM   Specimen: Nasopharyngeal Swab; Nasopharyngeal(NP) swabs in vial transport medium  Result Value Ref Range Status   SARS Coronavirus 2 by RT PCR NEGATIVE NEGATIVE Final    Comment: (NOTE) SARS-CoV-2 target nucleic acids are NOT DETECTED.  The SARS-CoV-2 RNA is generally  detectable in upper respiratory specimens during the acute phase of infection. The lowest concentration of SARS-CoV-2 viral copies this assay can detect is 138 copies/mL. A negative result does not preclude SARS-Cov-2 infection and should not be used as the sole basis for treatment or other patient management decisions. A negative result may occur with  improper specimen collection/handling, submission of specimen other than nasopharyngeal swab, presence of viral mutation(s) within the areas targeted by this assay, and inadequate number of viral copies(<138 copies/mL). A negative result must be combined with clinical observations, patient history, and epidemiological information. The expected result is Negative.  Fact Sheet for Patients:  BloggerCourse.com  Fact Sheet for Healthcare Providers:  SeriousBroker.it  This test is no t yet approved or cleared by the [place] FDA and  has been authorized for detection and/or diagnosis of SARS-CoV-2 by FDA under an Emergency Use Authorization (EUA). This EUA will remain  in effect (meaning this test can be used) for the duration of the COVID-19 declaration under Section 564(b)(1) of the Act, 21 U.S.C.section 360bbb-3(b)(1), unless the authorization is terminated  or revoked sooner.       Influenza A by PCR NEGATIVE NEGATIVE Final   Influenza B by PCR NEGATIVE NEGATIVE Final    Comment: (NOTE) The Xpert Xpress SARS-CoV-2/FLU/RSV plus assay is intended as an aid in the diagnosis of influenza from Nasopharyngeal swab specimens and should not be used as a sole basis for treatment. Nasal washings and aspirates are unacceptable for Xpert Xpress SARS-CoV-2/FLU/RSV testing.  Fact Sheet for Patients: BloggerCourse.com  Fact Sheet for Healthcare Providers: SeriousBroker.it  This test is not yet approved or cleared by the [place] FDA  and has been authorized for detection and/or diagnosis of SARS-CoV-2 by FDA under an Emergency Use Authorization (EUA). This EUA will remain in effect (meaning this test can be used) for the duration of the COVID-19 declaration under Section 564(b)(1) of the Act, 21 U.S.C. section 360bbb-3(b)(1), unless the authorization is terminated or revoked.  Performed at [facility]     Radiology Studies: No results found.  Adaijah Endres T. Shakai Dolley [facility]  If 7PM-7AM, please contact night-coverage [email or web address] 01/14/2021, 1:22 PM

## 2021-01-14 NOTE — Progress Notes (Signed)
PT Cancellation Note  Patient Details Name: Margaretha Mahan MRN: [id number] DOB: [birth year]   Cancelled Treatment:    Reason Eval/Treat Not Completed: Pain limiting ability to participate. Will try back again later to see if pt wants to participate    Faye Ramsay, PT Acute Rehabilitation  Office: [phone] Pager: [phone]

## 2021-01-15 DIAGNOSIS — E871 Hypo-osmolality and hyponatremia: Secondary | ICD-10-CM

## 2021-01-15 DIAGNOSIS — I5032 Chronic diastolic (congestive) heart failure: Secondary | ICD-10-CM | POA: Diagnosis not present

## 2021-01-15 DIAGNOSIS — S2231XA Fracture of one rib, right side, initial encounter for closed fracture: Secondary | ICD-10-CM | POA: Diagnosis not present

## 2021-01-15 DIAGNOSIS — S2242XA Multiple fractures of ribs, left side, initial encounter for closed fracture: Secondary | ICD-10-CM

## 2021-01-15 DIAGNOSIS — W19XXXA Unspecified fall, initial encounter: Secondary | ICD-10-CM | POA: Diagnosis not present

## 2021-01-15 DIAGNOSIS — J9611 Chronic respiratory failure with hypoxia: Secondary | ICD-10-CM | POA: Diagnosis not present

## 2021-01-15 DIAGNOSIS — K219 Gastro-esophageal reflux disease without esophagitis: Secondary | ICD-10-CM

## 2021-01-15 LAB — RENAL FUNCTION PANEL
Albumin: 3.5 g/dL (ref 3.5–5.0)
Anion gap: 10 (ref 5–15)
BUN: 21 mg/dL (ref 8–23)
CO2: 23 mmol/L (ref 22–32)
Calcium: 9 mg/dL (ref 8.9–10.3)
Chloride: 97 mmol/L — ABNORMAL LOW (ref 98–111)
Creatinine, Ser: 0.79 mg/dL (ref 0.44–1.00)
GFR, Estimated: >60 mL/min (ref >60)
Glucose, Bld: 95 mg/dL (ref 70–99)
Phosphorus: 3 mg/dL (ref 2.5–4.6)
Potassium: 3.9 mmol/L (ref 3.5–5.1)
Sodium: 130 mmol/L — ABNORMAL LOW (ref 135–145)

## 2021-01-15 LAB — SARS CORONAVIRUS 2 (TAT 6-24 HRS): SARS Coronavirus 2: NEGATIVE

## 2021-01-15 LAB — MAGNESIUM: Magnesium: 1.8 mg/dL (ref 1.7–2.4)

## 2021-01-15 NOTE — TOC Progression Note (Signed)
Transition of Care [facility]) - Progression Note   Patient Details  Name: Sheila Mccoy MRN: [id number] Date of Birth: Feb 20, 1931  [facility]) CM/SW Contact  Sheila Schlein, LCSW Phone Number: 01/15/2021, 10:35 AM  Clinical Narrative: Patient unable to discharge to [facility]F today as the QuantiFERON-TB Gold is still in process. CSW confirmed with hospitalist patient will not need to discharge on fluid restrictions. CSW updated Sheila Mccoy with [facility]. Per Ms. Wilson, the ALF will reach out to the patient's daughters today to complete the paperwork for admission. CSW updated daughter regarding the delay in discharge. TOC awaiting test results needed for discharge.  Expected Discharge Plan: Assisted Living Barriers to Discharge: Continued Medical Work up  Expected Discharge Plan and Services Expected Discharge Plan: Assisted Living In-house Referral: Clinical Social Work Living arrangements for the past 2 months: Independent Living Facility              DME Arranged: N/A DME Agency: NA  Readmission Risk Interventions No flowsheet data found.

## 2021-01-15 NOTE — Progress Notes (Signed)
PROGRESS NOTE  Sheila Mccoy HQI:[id number] DOB: 1931/02/04   PCP: Caesar Bookman, NP  Patient is from: ILF  DOA: 01/11/2021 LOS: 0  Chief complaints: Accidental fall and right chest pain  Brief Narrative / Interim history: 87 year old F with PMH of chronic pulmonary fibrosis/RF on 2 L, diastolic CHF, hypothyroidism and anxiety and depression brought to ED with right chest pain after she had accidental fall.  X-ray showed mildly displaced right eighth rib fracture without pneumothorax or effusion.  Therapy recommended SNF but patient and family refused SNF and opted for ALF.  Waiting for QuantiFERON gold result before transfer to ALF.  Subjective: Seen and examined earlier this morning.  No major events overnight of this morning.  She is somewhat sleepy but awakes to voice easily.  She is oriented x4 except date.  Denies pain, shortness of breath or GI symptoms.  Objective: Vitals:   01/14/21 0520 01/14/21 1343 01/14/21 2146 01/15/21 0419  BP: (!) 158/70 (!) 151/76 (!) 152/86 (!) 153/76  Pulse: (!) 59 61 60 62  Resp: 16 17 16 16   Temp: 98.2 F (36.8 C) (!) 97.5 F (36.4 C) 98.1 F (36.7 C) 97.6 F (36.4 C)  TempSrc: Oral  Oral   SpO2: 97% 96% 96% 93%  Weight:      Height:        Intake/Output Summary (Last 24 hours) at 01/15/2021 1139 Last data filed at 01/15/2021 0947 Gross per 24 hour  Intake 850 ml  Output 950 ml  Net -100 ml   Filed Weights   01/11/21 1754  Weight: 65.3 kg    Examination:  GENERAL: No apparent distress.  Nontoxic. HEENT: MMM.  Vision and hearing grossly intact.  NECK: Supple.  No apparent JVD.  RESP: 93% on 2 L.  No IWOB.  Fair aeration bilaterally. CVS:  RRR. Heart sounds normal.  ABD/GI/GU: BS+. Abd soft, NTND.  MSK/EXT:  Moves extremities. No apparent deformity.  Trace edema bilaterally. SKIN: no apparent skin lesion or wound NEURO: Sleepy but wakes to voice.  Oriented x4 except date.  No apparent focal neuro deficit. PSYCH: Calm. Normal  affect.   Procedures:  None  Microbiology summarized: Influenza PCR and COVID-19 nonreactive.  Assessment & Plan: Accidental fall at independent living facility Traumatic closed fracture of one rib of right side with minimal displacement, initial encounter -Encourage incentive spirometry, OOB/PT/OT -Pain control regimen-scheduled Tylenol and topical Voltaren gel.   -As needed oxycodone and fentanyl based on severity of pain but may have to go by objective finding. -As needed MiraLAX and Senokot-S as needed for constipation  Pulmonary fibrosis/chronic hypoxic RF: Wears 2 L at baseline. -Encourage incentive spirometry and flutter valve as above -Continue home breathing treatments  Chronic diastolic heart failure: TTE in 09/2020 with LVEF of 60 to 65%, G2-DD. About 1 L UOP/24 hours.  LE edema improved.  Cr slightly up but about baseline. -Continue p.o. Lasix 20 mg daily -Monitor fluid status, renal functions and electrolytes  Hyponatremia: Na 130.  Relatively stable. -Continue monitoring  Hypothyroidism: Stable -Continue Synthroid.  Depression: Stable -Continue Risperdal and Wellbutrin -Decreased Remeron to 7.5 mg nightly on 4/10  GERD without esophagitis: -Continue PPI  Nasal congestion: Likely due to nasal cannula. -Continue Claritin, Flonase and saline nasal spray  Hyperkalemia: Resolved.  Debility/physical deconditioning: Uses walker at baseline. -Continue PT/OT  Disposition: ALF after QuantiFERON gold resolved.  Unfortunately, this was not drawn and to earlier this morning.  Body mass index is 25.5 kg/m.  DVT prophylaxis:  enoxaparin (LOVENOX) injection 40 mg Start: 01/12/21 1000  Code Status: DNR/DNI Family Communication: Left voicemail for patient's daughter. Level of care: Med-Surg Status is: Observation  The patient remains OBS appropriate and will d/c before 2 midnights.  Dispo: The patient is from: ILF              Anticipated d/c is  to: ALF after QuantiFERON gold result              Patient currently is medically stable to d/c.   Difficult to place patient No       Consultants:  None   Sch Meds:  Scheduled Meds: . acetaminophen  1,000 mg Oral Q8H  . buPROPion  75 mg Oral Daily  . clopidogrel  75 mg Oral Daily  . diclofenac Sodium  2 g Topical QID  . docusate sodium  100 mg Oral Daily  . donepezil  5 mg Oral QHS  . dorzolamide-timolol  1 drop Left Eye BID  . enoxaparin (LOVENOX) injection  40 mg Subcutaneous Q24H  . ezetimibe  10 mg Oral Daily  . fluticasone  1 spray Each Nare Daily  . fluticasone furoate-vilanterol  1 puff Inhalation Daily  . furosemide  20 mg Oral Daily  . guaiFENesin  600 mg Oral BID  . latanoprost  1 drop Left Eye QHS  . levothyroxine  68.5 mcg Oral Q0600  . lidocaine  1 patch Transdermal Q24H  . loratadine  10 mg Oral Daily  . mouth rinse  15 mL Mouth Rinse BID  . mirtazapine  7.5 mg Oral QHS  . pantoprazole  40 mg Oral Daily  . risperiDONE  0.5 mg Oral QHS  . rosuvastatin  5 mg Oral Daily   Continuous Infusions: PRN Meds:.[DISCONTINUED] oxyCODONE-acetaminophen **OR** fentaNYL (SUBLIMAZE) injection, ipratropium-albuterol, ondansetron **OR** ondansetron (ZOFRAN) IV, oxyCODONE, polyethylene glycol, senna-docusate, sodium chloride  Antimicrobials: Anti-infectives (From admission, onward)   None       I have personally reviewed the following labs and images: CBC: Recent Labs  Lab 01/11/21 2101 01/12/21 0936  WBC 15.3* 12.2*  NEUTROABS 12.3* 9.6*  HGB 13.1 12.9  HCT 39.3 40.2  MCV 90.8 93.3  PLT 383 344   BMP &GFR Recent Labs  Lab 01/11/21 2101 01/11/21 2142 01/12/21 0703 01/14/21 0733 01/15/21 0459  NA 132*  --  133* 131* 130*  K 6.0* 4.5 3.8 3.8 3.9  CL 97*  --  99 99 97*  CO2 27  --  23 25 23   GLUCOSE 128*  --  122* 98 95  BUN 18  --  15 19 21   CREATININE 0.81  --  0.69 0.51 0.79  CALCIUM 9.7  --  9.2 8.9 9.0  MG  --   --  1.7 1.7 1.8  PHOS  --   --    --  2.6 3.0   Estimated Creatinine Clearance: 44.2 mL/min (by C-G formula based on SCr of 0.79 mg/dL). Liver & Pancreas: Recent Labs  Lab 01/11/21 2101 01/12/21 0703 01/14/21 0733 01/15/21 0459  AST 64* 34  --   --   ALT 28 28  --   --   ALKPHOS 107 99  --   --   BILITOT 0.7 0.6  --   --   PROT 8.0 7.3  --   --   ALBUMIN 4.3 3.8 3.3* 3.5   No results for input(s): LIPASE, AMYLASE in the last 168 hours. No results for input(s): AMMONIA in the last  168 hours. Diabetic: No results for input(s): HGBA1C in the last 72 hours. Recent Labs  Lab 01/13/21 0744  GLUCAP 105*   Cardiac Enzymes: No results for input(s): CKTOTAL, CKMB, CKMBINDEX, TROPONINI in the last 168 hours. No results for input(s): PROBNP in the last 8760 hours. Coagulation Profile: Recent Labs  Lab 01/12/21 0703  INR 1.0   Thyroid Function Tests: No results for input(s): TSH, T4TOTAL, FREET4, T3FREE, THYROIDAB in the last 72 hours. Lipid Profile: No results for input(s): CHOL, HDL, LDLCALC, TRIG, CHOLHDL, LDLDIRECT in the last 72 hours. Anemia Panel: No results for input(s): VITAMINB12, FOLATE, FERRITIN, TIBC, IRON, RETICCTPCT in the last 72 hours. Urine analysis:    Component Value Date/Time   COLORURINE YELLOW 09/29/2020 1750   APPEARANCEUR CLEAR 09/29/2020 1750   LABSPEC 1.010 09/29/2020 1750   PHURINE 6.0 09/29/2020 1750   GLUCOSEU NEGATIVE 09/29/2020 1750   HGBUR NEGATIVE 09/29/2020 1750   BILIRUBINUR NEGATIVE 09/29/2020 1750   KETONESUR NEGATIVE 09/29/2020 1750   PROTEINUR NEGATIVE 09/29/2020 1750   NITRITE NEGATIVE 09/29/2020 1750   LEUKOCYTESUR NEGATIVE 09/29/2020 1750   Sepsis Labs: Invalid input(s): PROCALCITONIN, LACTICIDVEN  Microbiology: Recent Results (from the past 240 hour(s))  Resp Panel by RT-PCR (Flu A&B, Covid) Nasopharyngeal Swab     Status: None   Collection Time: 01/11/21 10:56 PM   Specimen: Nasopharyngeal Swab; Nasopharyngeal(NP) swabs in vial transport medium  Result  Value Ref Range Status   SARS Coronavirus 2 by RT PCR NEGATIVE NEGATIVE Final    Comment: (NOTE) SARS-CoV-2 target nucleic acids are NOT DETECTED.  The SARS-CoV-2 RNA is generally detectable in upper respiratory specimens during the acute phase of infection. The lowest concentration of SARS-CoV-2 viral copies this assay can detect is 138 copies/mL. A negative result does not preclude SARS-Cov-2 infection and should not be used as the sole basis for treatment or other patient management decisions. A negative result may occur with  improper specimen collection/handling, submission of specimen other than nasopharyngeal swab, presence of viral mutation(s) within the areas targeted by this assay, and inadequate number of viral copies(<138 copies/mL). A negative result must be combined with clinical observations, patient history, and epidemiological information. The expected result is Negative.  Fact Sheet for Patients:  BloggerCourse.com  Fact Sheet for Healthcare Providers:  SeriousBroker.it  This test is no t yet approved or cleared by the [place] FDA and  has been authorized for detection and/or diagnosis of SARS-CoV-2 by FDA under an Emergency Use Authorization (EUA). This EUA will remain  in effect (meaning this test can be used) for the duration of the COVID-19 declaration under Section 564(b)(1) of the Act, 21 U.S.C.section 360bbb-3(b)(1), unless the authorization is terminated  or revoked sooner.       Influenza A by PCR NEGATIVE NEGATIVE Final   Influenza B by PCR NEGATIVE NEGATIVE Final    Comment: (NOTE) The Xpert Xpress SARS-CoV-2/FLU/RSV plus assay is intended as an aid in the diagnosis of influenza from Nasopharyngeal swab specimens and should not be used as a sole basis for treatment. Nasal washings and aspirates are unacceptable for Xpert Xpress SARS-CoV-2/FLU/RSV testing.  Fact Sheet for  Patients: BloggerCourse.com  Fact Sheet for Healthcare Providers: SeriousBroker.it  This test is not yet approved or cleared by the [place] FDA and has been authorized for detection and/or diagnosis of SARS-CoV-2 by FDA under an Emergency Use Authorization (EUA). This EUA will remain in effect (meaning this test can be used) for the duration of the COVID-19 declaration under Section  564(b)(1) of the Act, 21 U.S.C. section 360bbb-3(b)(1), unless the authorization is terminated or revoked.  Performed at [facility]     Radiology Studies: No results found.    Janitza Revuelta T. Eduardo Wurth [facility]  If 7PM-7AM, please contact night-coverage [email or web address] 01/15/2021, 11:39 AM

## 2021-01-16 ENCOUNTER — Observation Stay (HOSPITAL_COMMUNITY): Payer: Medicare HMO

## 2021-01-16 DIAGNOSIS — F32A Depression, unspecified: Secondary | ICD-10-CM | POA: Diagnosis not present

## 2021-01-16 DIAGNOSIS — J9611 Chronic respiratory failure with hypoxia: Secondary | ICD-10-CM | POA: Diagnosis not present

## 2021-01-16 DIAGNOSIS — S2231XA Fracture of one rib, right side, initial encounter for closed fracture: Secondary | ICD-10-CM | POA: Diagnosis not present

## 2021-01-16 DIAGNOSIS — I5032 Chronic diastolic (congestive) heart failure: Secondary | ICD-10-CM | POA: Diagnosis not present

## 2021-01-16 DIAGNOSIS — M546 Pain in thoracic spine: Secondary | ICD-10-CM

## 2021-01-16 DIAGNOSIS — K5900 Constipation, unspecified: Secondary | ICD-10-CM

## 2021-01-16 LAB — CBC
HCT: 38.1 % (ref 36.0–46.0)
Hemoglobin: 12.5 g/dL (ref 12.0–15.0)
MCH: 30.3 pg (ref 26.0–34.0)
MCHC: 32.8 g/dL (ref 30.0–36.0)
MCV: 92.3 fL (ref 80.0–100.0)
Platelets: 356 10*3/uL (ref 150–400)
RBC: 4.13 MIL/uL (ref 3.87–5.11)
RDW: 13.3 % (ref 11.5–15.5)
WBC: 10.1 10*3/uL (ref 4.0–10.5)
nRBC: 0 % (ref 0.0–0.2)

## 2021-01-16 LAB — RENAL FUNCTION PANEL
Albumin: 3.3 g/dL — ABNORMAL LOW (ref 3.5–5.0)
Anion gap: 12 (ref 5–15)
BUN: 24 mg/dL — ABNORMAL HIGH (ref 8–23)
CO2: 22 mmol/L (ref 22–32)
Calcium: 9 mg/dL (ref 8.9–10.3)
Chloride: 97 mmol/L — ABNORMAL LOW (ref 98–111)
Creatinine, Ser: 0.79 mg/dL (ref 0.44–1.00)
GFR, Estimated: >60 mL/min (ref >60)
Glucose, Bld: 89 mg/dL (ref 70–99)
Phosphorus: 2.8 mg/dL (ref 2.5–4.6)
Potassium: 3.8 mmol/L (ref 3.5–5.1)
Sodium: 131 mmol/L — ABNORMAL LOW (ref 135–145)

## 2021-01-16 LAB — MAGNESIUM: Magnesium: 1.7 mg/dL (ref 1.7–2.4)

## 2021-01-16 MED ORDER — MAGNESIUM SULFATE 2 GM/50ML IV SOLN
2.0000 g | Freq: Once | INTRAVENOUS | Status: AC
  Administered 2021-01-16: 2 g via INTRAVENOUS
  Filled 2021-01-16: qty 50

## 2021-01-16 MED ORDER — POLYETHYLENE GLYCOL 3350 17 G PO PACK
17.0000 g | PACK | Freq: Two times a day (BID) | ORAL | Status: DC
  Administered 2021-01-16 – 2021-01-18 (×5): 17 g via ORAL
  Filled 2021-01-16 (×5): qty 1

## 2021-01-16 MED ORDER — FENTANYL CITRATE (PF) 100 MCG/2ML IJ SOLN
25.0000 ug | INTRAMUSCULAR | Status: DC | PRN
Start: 2021-01-16 — End: 2021-01-19
  Administered 2021-01-16 (×2): 25 ug via INTRAVENOUS
  Filled 2021-01-16 (×2): qty 2

## 2021-01-16 MED ORDER — METHOCARBAMOL 500 MG PO TABS
500.0000 mg | ORAL_TABLET | Freq: Three times a day (TID) | ORAL | Status: DC | PRN
  Administered 2021-01-16 – 2021-01-18 (×5): 500 mg via ORAL
  Filled 2021-01-16 (×5): qty 1

## 2021-01-16 MED ORDER — MAGNESIUM CITRATE PO SOLN: 1.0000 | Freq: Every day | ORAL | Status: DC | PRN

## 2021-01-16 MED ORDER — SENNOSIDES-DOCUSATE SODIUM 8.6-50 MG PO TABS
1.0000 | ORAL_TABLET | Freq: Two times a day (BID) | ORAL | Status: DC
  Administered 2021-01-16 – 2021-01-18 (×5): 1 via ORAL
  Filled 2021-01-16 (×5): qty 1

## 2021-01-16 NOTE — Progress Notes (Signed)
Physical Therapy Treatment Patient Details Name: Sheila Mccoy MRN: [id number] DOB: 1930-12-18 Today's Date: 01/16/2021    History of Present Illness Pt is 87 yo female admitted s/p fall, chest wall pain, with mildly displaced 8th R rib fx.  PMH includes hypothyroidism, anxiety , depression, CHF, pulmonary fibrosis on 2 L O2 at home    PT Comments    Attempted PT tx session on today. Pt declined OOB 2* pain. She was only agreeable to slight repositioning in bed for K pad placement. Will continue to follow and progress activity as able.    Follow Up Recommendations  SNF;Supervision/Assistance - 24 hour (Pt/family declining SNF placement. Pt will need 24 hour supervision/assist and HHPT if she discharges to ALF.)     Equipment Recommendations  None recommended by PT    Recommendations for Other Services       Precautions / Restrictions Precautions Precautions: Fall Precaution Comments: R rib fx. O2 dep Restrictions Weight Bearing Restrictions: No    Mobility  Bed Mobility               General bed mobility comments: Slight repositioning only on today in order to place K pad heating pad at pt's request. Pt declined OOB on today 2* pain.    Transfers                    Ambulation/Gait                 Stairs             Wheelchair Mobility    Modified Rankin (Stroke Patients Only)       Balance                                            Cognition Arousal/Alertness: Awake/alert Behavior During Therapy: WFL for tasks assessed/performed Overall Cognitive Status: Within Functional Limits for tasks assessed                                        Exercises      General Comments        Pertinent Vitals/Pain Pain Assessment: Faces Faces Pain Scale: Hurts whole lot Pain Location: R side Pain Descriptors / Indicators: Grimacing;Discomfort;Guarding Pain Intervention(s): Limited activity within  patient's tolerance;Monitored during session;Repositioned    Home Living                      Prior Function            PT Goals (current goals can now be found in the care plan section) Progress towards PT goals: Not progressing toward goals - comment    Frequency    Min 3X/week      PT Plan Current plan remains appropriate    Co-evaluation              AM-PAC PT "6 Clicks" Mobility   Outcome Measure  Help needed turning from your back to your side while in a flat bed without using bedrails?: A Lot Help needed moving from lying on your back to sitting on the side of a flat bed without using bedrails?: A Lot Help needed moving to and from a bed to a chair (including a wheelchair)?: A Lot Help needed standing  up from a chair using your arms (e.g., wheelchair or bedside chair)?: A Lot Help needed to walk in hospital room?: A Lot Help needed climbing 3-5 steps with a railing? : Total 6 Click Score: 11    End of Session Equipment Utilized During Treatment: Oxygen Activity Tolerance: Patient limited by pain Patient left: in bed;with call bell/phone within reach   PT Visit Diagnosis: Muscle weakness (generalized) (M62.81);History of falling (Z91.81);Pain;Unsteadiness on feet (R26.81);Difficulty in walking, not elsewhere classified (R26.2)     Time: 3567-0141 PT Time Calculation (min) (ACUTE ONLY): 16 min  Charges:  $Therapeutic Activity: 8-22 mins                        Faye Ramsay, PT Acute Rehabilitation  Office: [phone] Pager: [phone]

## 2021-01-16 NOTE — Progress Notes (Signed)
PROGRESS NOTE  Sheila Mccoy VQX:[id number] DOB: 12-01-1930   PCP: Caesar Bookman, NP  Patient is from: ILF  DOA: 01/11/2021 LOS: 0  Chief complaints: Accidental fall and right chest pain  Brief Narrative / Interim history: 87 year old F with PMH of chronic pulmonary fibrosis/RF on 2 L, diastolic CHF, hypothyroidism and anxiety and depression brought to ED with right chest pain after she had accidental fall.  X-ray showed mildly displaced right eighth rib fracture without pneumothorax or effusion.  Therapy recommended SNF but patient and family refused SNF and opted for ALF.  Waiting for QuantiFERON gold result before transfer to ALF.  Subjective: Seen and examined earlier this morning.  She complains of pain over her right back.  She came the pain is coming from broken ribs.  She rates her pain 10/10.  Denies chest pain, shortness of breath or UTI symptoms but she thinks the purwick is not working well. She says her bed shit is wet. She doesn't think she has had BM for days.  Patient's daughter available over the phone during this encounter.  Objective: Vitals:   01/15/21 2036 01/15/21 2134 01/16/21 0527 01/16/21 0719  BP: (!) 102/49 134/67 135/76   Pulse: (!) 59 (!) 59 60   Resp: 18  16   Temp: 97.8 F (36.6 C)  (!) 97.5 F (36.4 C)   TempSrc: Oral  Oral   SpO2: 94%  94% 95%  Weight:      Height:        Intake/Output Summary (Last 24 hours) at 01/16/2021 1104 Last data filed at 01/16/2021 1000 Gross per 24 hour  Intake 1260 ml  Output 1700 ml  Net -440 ml   Filed Weights   01/11/21 1754  Weight: 65.3 kg    Examination:  GENERAL: Frail looking elderly female.  No apparent distress.  Nontoxic. HEENT: MMM.  Vision and hearing grossly intact.  NECK: Supple.  No apparent JVD.  RESP: 95% on 2 L.  No IWOB.  Fair aeration bilaterally. CVS:  RRR. Heart sounds normal.  ABD/GI/GU: BS+. Abd soft, NTND.  MSK/EXT:  Moves extremities. No apparent deformity.  Trace edema  bilaterally. SKIN: no apparent skin lesion or wound NEURO: Awake, alert and oriented appropriately.  No apparent focal neuro deficit. PSYCH: Calm. Normal affect.   Procedures:  None  Microbiology summarized: Influenza PCR and COVID-19 nonreactive.  Assessment & Plan: Accidental fall at independent living facility Traumatic closed fracture of one rib of right side with minimal displacement, initial encounter Acute back pain likely due to the above -Encourage incentive spirometry, OOB/PT/OT -Pain control regimen-scheduled Tylenol and topical Voltaren gel.   -As needed oxycodone and fentanyl based on severity of pain.  She reports severe pain today -Scheduled MiraLAX and Senokot-S until she has BM. -Add p.o. mag citrate as needed for severe constipation -Check x-ray of thoracic spine -Encouraged to get out of the bed and sit on the chair. -Encouraged to use bedside commode  Pulmonary fibrosis/chronic hypoxic RF: Wears 2 L at baseline. -Encourage incentive spirometry and flutter valve as above -Continue home breathing treatments  Chronic diastolic heart failure: TTE in 09/2020 with LVEF of 60 to 65%, G2-DD. About 1 L UOP/24 hours.  LE edema improved.  Cr slightly up but about baseline. -Continue p.o. Lasix 20 mg daily -Monitor fluid status, renal functions and electrolytes  Hyponatremia: Na 131.  Relatively stable. -Continue monitoring  Hypothyroidism: Stable -Continue Synthroid.  Depression: Stable -Continue Risperdal and Wellbutrin -Decreased Remeron to 7.5 mg nightly  on 4/10  GERD without esophagitis: -Continue PPI  Nasal congestion: Likely due to nasal cannula. -Continue Claritin, Flonase and saline nasal spray  Hyperkalemia: Resolved.  Debility/physical deconditioning: Uses walker at baseline. -Continue PT/OT  Disposition: ALF after QuantiFERON gold result but family seems to be undecided about current ALF  Body mass index is 25.5 kg/m.         DVT  prophylaxis:  enoxaparin (LOVENOX) injection 40 mg Start: 01/12/21 1000  Code Status: DNR/DNI Family Communication: Updated patient's daughter over the phone. Level of care: Med-Surg Status is: Observation  The patient remains OBS appropriate and will d/c before 2 midnights.  Dispo: The patient is from: ILF              Anticipated d/c is to: ALF after QuantiFERON gold result              Patient currently is medically stable to d/c.   Difficult to place patient No       Consultants:  None   Sch Meds:  Scheduled Meds: . acetaminophen  1,000 mg Oral Q8H  . buPROPion  75 mg Oral Daily  . clopidogrel  75 mg Oral Daily  . diclofenac Sodium  2 g Topical QID  . docusate sodium  100 mg Oral Daily  . donepezil  5 mg Oral QHS  . dorzolamide-timolol  1 drop Left Eye BID  . enoxaparin (LOVENOX) injection  40 mg Subcutaneous Q24H  . ezetimibe  10 mg Oral Daily  . fluticasone  1 spray Each Nare Daily  . fluticasone furoate-vilanterol  1 puff Inhalation Daily  . furosemide  20 mg Oral Daily  . guaiFENesin  600 mg Oral BID  . latanoprost  1 drop Left Eye QHS  . levothyroxine  68.5 mcg Oral Q0600  . lidocaine  1 patch Transdermal Q24H  . loratadine  10 mg Oral Daily  . mouth rinse  15 mL Mouth Rinse BID  . mirtazapine  7.5 mg Oral QHS  . pantoprazole  40 mg Oral Daily  . polyethylene glycol  17 g Oral BID  . risperiDONE  0.5 mg Oral QHS  . rosuvastatin  5 mg Oral Daily  . senna-docusate  1 tablet Oral BID   Continuous Infusions: PRN Meds:.[DISCONTINUED] oxyCODONE-acetaminophen **OR** fentaNYL (SUBLIMAZE) injection, ipratropium-albuterol, ondansetron **OR** ondansetron (ZOFRAN) IV, oxyCODONE, sodium chloride  Antimicrobials: Anti-infectives (From admission, onward)   None       I have personally reviewed the following labs and images: CBC: Recent Labs  Lab 01/11/21 2101 01/12/21 0936 01/16/21 0330  WBC 15.3* 12.2* 10.1  NEUTROABS 12.3* 9.6*  --   HGB 13.1 12.9  12.5  HCT 39.3 40.2 38.1  MCV 90.8 93.3 92.3  PLT 383 344 356   BMP &GFR Recent Labs  Lab 01/11/21 2101 01/11/21 2142 01/12/21 0703 01/14/21 0733 01/15/21 0459 01/16/21 0330  NA 132*  --  133* 131* 130* 131*  K 6.0* 4.5 3.8 3.8 3.9 3.8  CL 97*  --  99 99 97* 97*  CO2 27  --  23 25 23 22   GLUCOSE 128*  --  122* 98 95 89  BUN 18  --  15 19 21  24*  CREATININE 0.81  --  0.69 0.51 0.79 0.79  CALCIUM 9.7  --  9.2 8.9 9.0 9.0  MG  --   --  1.7 1.7 1.8 1.7  PHOS  --   --   --  2.6 3.0 2.8   Estimated Creatinine Clearance:  44.2 mL/min (by C-G formula based on SCr of 0.79 mg/dL). Liver & Pancreas: Recent Labs  Lab 01/11/21 2101 01/12/21 0703 01/14/21 0733 01/15/21 0459 01/16/21 0330  AST 64* 34  --   --   --   ALT 28 28  --   --   --   ALKPHOS 107 99  --   --   --   BILITOT 0.7 0.6  --   --   --   PROT 8.0 7.3  --   --   --   ALBUMIN 4.3 3.8 3.3* 3.5 3.3*   No results for input(s): LIPASE, AMYLASE in the last 168 hours. No results for input(s): AMMONIA in the last 168 hours. Diabetic: No results for input(s): HGBA1C in the last 72 hours. Recent Labs  Lab 01/13/21 0744  GLUCAP 105*   Cardiac Enzymes: No results for input(s): CKTOTAL, CKMB, CKMBINDEX, TROPONINI in the last 168 hours. No results for input(s): PROBNP in the last 8760 hours. Coagulation Profile: Recent Labs  Lab 01/12/21 0703  INR 1.0   Thyroid Function Tests: No results for input(s): TSH, T4TOTAL, FREET4, T3FREE, THYROIDAB in the last 72 hours. Lipid Profile: No results for input(s): CHOL, HDL, LDLCALC, TRIG, CHOLHDL, LDLDIRECT in the last 72 hours. Anemia Panel: No results for input(s): VITAMINB12, FOLATE, FERRITIN, TIBC, IRON, RETICCTPCT in the last 72 hours. Urine analysis:    Component Value Date/Time   COLORURINE YELLOW 09/29/2020 1750   APPEARANCEUR CLEAR 09/29/2020 1750   LABSPEC 1.010 09/29/2020 1750   PHURINE 6.0 09/29/2020 1750   GLUCOSEU NEGATIVE 09/29/2020 1750   HGBUR NEGATIVE  09/29/2020 1750   BILIRUBINUR NEGATIVE 09/29/2020 1750   KETONESUR NEGATIVE 09/29/2020 1750   PROTEINUR NEGATIVE 09/29/2020 1750   NITRITE NEGATIVE 09/29/2020 1750   LEUKOCYTESUR NEGATIVE 09/29/2020 1750   Sepsis Labs: Invalid input(s): PROCALCITONIN, LACTICIDVEN  Microbiology: Recent Results (from the past 240 hour(s))  Resp Panel by RT-PCR (Flu A&B, Covid) Nasopharyngeal Swab     Status: None   Collection Time: 01/11/21 10:56 PM   Specimen: Nasopharyngeal Swab; Nasopharyngeal(NP) swabs in vial transport medium  Result Value Ref Range Status   SARS Coronavirus 2 by RT PCR NEGATIVE NEGATIVE Final    Comment: (NOTE) SARS-CoV-2 target nucleic acids are NOT DETECTED.  The SARS-CoV-2 RNA is generally detectable in upper respiratory specimens during the acute phase of infection. The lowest concentration of SARS-CoV-2 viral copies this assay can detect is 138 copies/mL. A negative result does not preclude SARS-Cov-2 infection and should not be used as the sole basis for treatment or other patient management decisions. A negative result may occur with  improper specimen collection/handling, submission of specimen other than nasopharyngeal swab, presence of viral mutation(s) within the areas targeted by this assay, and inadequate number of viral copies(<138 copies/mL). A negative result must be combined with clinical observations, patient history, and epidemiological information. The expected result is Negative.  Fact Sheet for Patients:  BloggerCourse.com  Fact Sheet for Healthcare Providers:  SeriousBroker.it  This test is no t yet approved or cleared by the [place] FDA and  has been authorized for detection and/or diagnosis of SARS-CoV-2 by FDA under an Emergency Use Authorization (EUA). This EUA will remain  in effect (meaning this test can be used) for the duration of the COVID-19 declaration under Section 564(b)(1) of the  Act, 21 U.S.C.section 360bbb-3(b)(1), unless the authorization is terminated  or revoked sooner.       Influenza A by PCR NEGATIVE NEGATIVE Final  Influenza B by PCR NEGATIVE NEGATIVE Final    Comment: (NOTE) The Xpert Xpress SARS-CoV-2/FLU/RSV plus assay is intended as an aid in the diagnosis of influenza from Nasopharyngeal swab specimens and should not be used as a sole basis for treatment. Nasal washings and aspirates are unacceptable for Xpert Xpress SARS-CoV-2/FLU/RSV testing.  Fact Sheet for Patients: BloggerCourse.com  Fact Sheet for Healthcare Providers: SeriousBroker.it  This test is not yet approved or cleared by the [place] FDA and has been authorized for detection and/or diagnosis of SARS-CoV-2 by FDA under an Emergency Use Authorization (EUA). This EUA will remain in effect (meaning this test can be used) for the duration of the COVID-19 declaration under Section 564(b)(1) of the Act, 21 U.S.C. section 360bbb-3(b)(1), unless the authorization is terminated or revoked.  Performed at [facility]   SARS CORONAVIRUS 2 (TAT 6-24 HRS) Nasopharyngeal Nasopharyngeal Swab     Status: None   Collection Time: 01/15/21  5:00 AM   Specimen: Nasopharyngeal Swab  Result Value Ref Range Status   SARS Coronavirus 2 NEGATIVE NEGATIVE Final    Comment: (NOTE) SARS-CoV-2 target nucleic acids are NOT DETECTED.  The SARS-CoV-2 RNA is generally detectable in upper and lower respiratory specimens during the acute phase of infection. Negative results do not preclude SARS-CoV-2 infection, do not rule out co-infections with other pathogens, and should not be used as the sole basis for treatment or other patient management decisions. Negative results must be combined with clinical observations, patient history, and epidemiological information. The expected result is Negative.  Fact Sheet for  Patients: HairSlick.no  Fact Sheet for Healthcare Providers: quierodirigir.com  This test is not yet approved or cleared by the [place] FDA and  has been authorized for detection and/or diagnosis of SARS-CoV-2 by FDA under an Emergency Use Authorization (EUA). This EUA will remain  in effect (meaning this test can be used) for the duration of the COVID-19 declaration under Se ction 564(b)(1) of the Act, 21 U.S.C. section 360bbb-3(b)(1), unless the authorization is terminated or revoked sooner.  Performed at [facility], [address], [place], [place] [address]     Radiology Studies: No results found.    Eaven Schwager T. Hara Milholland [facility]  If 7PM-7AM, please contact night-coverage [email or web address] 01/16/2021, 11:04 AM

## 2021-01-16 NOTE — TOC Progression Note (Addendum)
Transition of Care [facility]) - Progression Note   Patient Details  Name: Jacquie Lukes MRN: [id number] Date of Birth: [birth year]  [facility] CM/SW Contact  Ewing Schlein, LCSW Phone Number: 01/16/2021, 1:53 PM  Clinical Narrative: CSW spoke with patient's daughter who is now contacting additional ALFs due to the cost of [facility]. CSW explained that all ALFs will be private pay and cost several thousand dollars per month. Daughter provided CSW with contact information for [facility] [phone]). CSW spoke with Lanora Manis who will be coming out to assess the patient this afternoon. RN updated. TOC to follow.  Addendum: CSW spoke with Clearence Cheek from [facility]. Per Ms. Andrey Campanile, she was told by the family they did not want [facility] anymore so the admission process was stopped and the patient likely will not have a bed if the family changes their mind. CSW updated daughter regarding losing the bed. Daughter aware she will need to select another ALF and will follow up with [facility].  Expected Discharge Plan: Assisted Living Barriers to Discharge: Continued Medical Work up  Expected Discharge Plan and Services Expected Discharge Plan: Assisted Living In-house Referral: Clinical Social Work Living arrangements for the past 2 months: Independent Living Facility            DME Arranged: N/A DME Agency: NA  Readmission Risk Interventions No flowsheet data found.

## 2021-01-16 NOTE — Progress Notes (Signed)
Patients purewick was removed per order and patient unable to tell RN or NT when she needs to urinate. Purewick replaced.

## 2021-01-17 ENCOUNTER — Other Ambulatory Visit: Payer: Medicare HMO

## 2021-01-17 DIAGNOSIS — I5032 Chronic diastolic (congestive) heart failure: Secondary | ICD-10-CM | POA: Diagnosis not present

## 2021-01-17 DIAGNOSIS — S22000A Wedge compression fracture of unspecified thoracic vertebra, initial encounter for closed fracture: Secondary | ICD-10-CM

## 2021-01-17 DIAGNOSIS — F32A Depression, unspecified: Secondary | ICD-10-CM | POA: Diagnosis not present

## 2021-01-17 DIAGNOSIS — J9611 Chronic respiratory failure with hypoxia: Secondary | ICD-10-CM | POA: Diagnosis not present

## 2021-01-17 DIAGNOSIS — S2231XA Fracture of one rib, right side, initial encounter for closed fracture: Secondary | ICD-10-CM | POA: Diagnosis not present

## 2021-01-17 LAB — RENAL FUNCTION PANEL
Albumin: 3.3 g/dL — ABNORMAL LOW (ref 3.5–5.0)
Anion gap: 8 (ref 5–15)
BUN: 23 mg/dL (ref 8–23)
CO2: 27 mmol/L (ref 22–32)
Calcium: 9 mg/dL (ref 8.9–10.3)
Chloride: 94 mmol/L — ABNORMAL LOW (ref 98–111)
Creatinine, Ser: 0.92 mg/dL (ref 0.44–1.00)
GFR, Estimated: 60 mL/min — ABNORMAL LOW (ref >60)
Glucose, Bld: 98 mg/dL (ref 70–99)
Phosphorus: 3.1 mg/dL (ref 2.5–4.6)
Potassium: 3.8 mmol/L (ref 3.5–5.1)
Sodium: 129 mmol/L — ABNORMAL LOW (ref 135–145)

## 2021-01-17 LAB — MAGNESIUM: Magnesium: 2 mg/dL (ref 1.7–2.4)

## 2021-01-17 MED ORDER — FUROSEMIDE 20 MG PO TABS
20.0000 mg | ORAL_TABLET | ORAL | Status: DC
  Administered 2021-01-18: 20 mg via ORAL
  Filled 2021-01-17 (×2): qty 1

## 2021-01-17 NOTE — TOC Progression Note (Addendum)
Transition of Care [facility]) - Progression Note    Patient Details  Name: Sheila Mccoy MRN: [id number] Date of Birth: Feb 17, 1931  Transition of Care [facility]) CM/SW Contact  Amada Jupiter, LCSW Phone Number: 01/17/2021, 1:24 PM  Clinical Narrative:    Have spoken with pt's daughter today and informed her that [facility] (contact, Lanora Manis) does not feel they can offer bed due to level of care she requires.  Explained to daughter that therapy team continues to recommend SNF and daughter is in agreement with this plan now.  We discussed SNF preferences and I will begin insurance authorization process as well as SNF bed search.  TOC will continue to follow.  Have alerted MD.  Ruthe Mannan begun - ref # [id number]  Expected Discharge Plan: Assisted Living Barriers to Discharge: Continued Medical Work up  Expected Discharge Plan and Services Expected Discharge Plan: Assisted Living In-house Referral: Clinical Social Work     Living arrangements for the past 2 months: Independent Living Facility                 DME Arranged: N/A DME Agency: NA                   Social Determinants of Health (SDOH) Interventions    Readmission Risk Interventions No flowsheet data found.

## 2021-01-17 NOTE — [place] FL2 (Signed)
[place] MEDICAID FL2 LEVEL OF CARE SCREENING TOOL     IDENTIFICATION  Patient Name: Sheila Mccoy Birthdate: [birth year] Sex: female Admission Date (Current Location): 01/11/2021  [facility] and [place] Number:  Producer, television/film/video and Address:  [facility],  [address], [place] [address]      Provider Number: [id number]  Attending Physician Name and Address:  Almon Hercules, MD  Relative Name and Phone Number:  Mertha Finders (daughter) Ph: [phone]    Current Level of Care: Hospital Recommended Level of Care: Skilled Nursing Facility Prior Approval Number:    Date Approved/Denied:   PASRR Number: [id number]  Discharge Plan: SNF    Current Diagnoses: Patient Active Problem List   Diagnosis Date Noted  . Chronic respiratory failure with hypoxia (HCC) 01/12/2021  . Chronic diastolic CHF (congestive heart failure) (HCC) 01/12/2021  . GERD without esophagitis 01/12/2021  . Traumatic closed fracture of one rib of right side with minimal displacement, initial encounter 01/11/2021  . Memory difficulty 01/02/2021  . Hypothyroidism 09/30/2020  . Depression 09/30/2020  . TIA (transient ischemic attack) 09/29/2020  . Anxiety 07/12/2020  . Adnexal cyst 07/09/2020    Orientation RESPIRATION BLADDER Height & Weight     Self,Time,Situation  O2 Incontinent Weight: 143 lb 15.4 oz (65.3 kg) Height:  5\' 3"  (160 cm)  BEHAVIORAL SYMPTOMS/MOOD NEUROLOGICAL BOWEL NUTRITION STATUS      Continent Diet (Heart healthy)  AMBULATORY STATUS COMMUNICATION OF NEEDS Skin   Extensive Assist Verbally Normal                       Personal Care Assistance Level of Assistance  Bathing,Feeding,Dressing Bathing Assistance: Limited assistance Feeding assistance: Independent Dressing Assistance: Limited assistance     Functional Limitations Info  Sight,Hearing,Speech Sight Info: Adequate Hearing Info: Adequate Speech Info: Adequate    SPECIAL CARE FACTORS  FREQUENCY  PT (By licensed PT),OT (By licensed OT)     PT Frequency: 5x/wk OT Frequency: 5x/wk            Contractures Contractures Info: Not present    Additional Factors Info  Code Status,Allergies,Psychotropic Code Status Info: DNR Allergies Info: see MAR Psychotropic Info: see MAR         Current Medications (01/17/2021):  This is the current hospital active medication list Current Facility-Administered Medications  Medication Dose Route Frequency Provider Last Rate Last Admin  . acetaminophen (TYLENOL) tablet 1,000 mg  1,000 mg Oral Q8H Gonfa, Taye T, MD   1,000 mg at 01/17/21 1317  . buPROPion [facility]) tablet 75 mg  75 mg Oral Daily Shalhoub, [facility], MD   75 mg at 01/17/21 1020  . clopidogrel (PLAVIX) tablet 75 mg  75 mg Oral Daily Shalhoub, 01/19/21, MD   75 mg at 01/17/21 1020  . diclofenac Sodium (VOLTAREN) 1 % topical gel 2 g  2 g Topical QID 01/19/21 T, MD   2 g at 01/15/21 1113  . docusate sodium (COLACE) capsule 100 mg  100 mg Oral Daily 03/17/21 T, MD   100 mg at 01/17/21 1020  . donepezil (ARICEPT) tablet 5 mg  5 mg Oral QHS Shalhoub, 01/19/21, MD   5 mg at 01/16/21 2218  . dorzolamide-timolol (COSOPT) 22.3-6.8 MG/ML ophthalmic solution 1 drop  1 drop Left Eye BID 2219, MD   1 drop at 01/17/21 1100  . enoxaparin (LOVENOX) injection 40 mg  40 mg Subcutaneous Q24H Shalhoub, 01/19/21, MD  40 mg at 01/17/21 1023  . ezetimibe (ZETIA) tablet 10 mg  10 mg Oral Daily Marinda Elk, MD   10 mg at 01/17/21 1020  . fentaNYL (SUBLIMAZE) injection 25 mcg  25 mcg Intravenous Q2H PRN Almon Hercules, MD   25 mcg at 01/16/21 1436  . fluticasone (FLONASE) 50 MCG/ACT nasal spray 1 spray  1 spray Each Nare Daily Almon Hercules, MD   1 spray at 01/17/21 1106  . fluticasone furoate-vilanterol (BREO ELLIPTA) 100-25 MCG/INH 1 puff  1 puff Inhalation Daily Shalhoub, Deno Lunger, MD   1 puff at 01/17/21 0755  . [START ON 01/18/2021] furosemide (LASIX) tablet 20 mg   20 mg Oral QODAY Gonfa, Taye T, MD      . guaiFENesin (MUCINEX) 12 hr tablet 600 mg  600 mg Oral BID Candelaria Stagers T, MD   600 mg at 01/17/21 1020  . ipratropium-albuterol (DUONEB) 0.5-2.5 (3) MG/3ML nebulizer solution 3 mL  3 mL Nebulization Q4H PRN Candelaria Stagers T, MD   3 mL at 01/16/21 1955  . latanoprost (XALATAN) 0.005 % ophthalmic solution 1 drop  1 drop Left Eye QHS Shalhoub, Deno Lunger, MD   1 drop at 01/16/21 2219  . levothyroxine (SYNTHROID) tablet 68.5 mcg  68.5 mcg Oral Q0600 Marinda Elk, MD   68.5 mcg at 01/17/21 0522  . lidocaine (LIDODERM) 5 % 1 patch  1 patch Transdermal Q24H Shalhoub, Deno Lunger, MD   1 patch at 01/13/21 1701  . loratadine (CLARITIN) tablet 10 mg  10 mg Oral Daily Candelaria Stagers T, MD   10 mg at 01/17/21 1021  . magnesium citrate solution 1 Bottle  1 Bottle Oral Daily PRN Candelaria Stagers T, MD      . MEDLINE mouth rinse  15 mL Mouth Rinse BID Shalhoub, Deno Lunger, MD   15 mL at 01/17/21 1100  . methocarbamol (ROBAXIN) tablet 500 mg  500 mg Oral Q8H PRN Candelaria Stagers T, MD   500 mg at 01/17/21 1023  . mirtazapine (REMERON) tablet 7.5 mg  7.5 mg Oral QHS Candelaria Stagers T, MD   7.5 mg at 01/16/21 2218  . ondansetron (ZOFRAN) tablet 4 mg  4 mg Oral Q6H PRN Shalhoub, Deno Lunger, MD       Or  . ondansetron [facility]) injection 4 mg  4 mg Intravenous Q6H PRN Shalhoub, Deno Lunger, MD      . oxyCODONE (Oxy IR/ROXICODONE) immediate release tablet 5 mg  5 mg Oral Q6H PRN Candelaria Stagers T, MD   5 mg at 01/17/21 1023  . pantoprazole (PROTONIX) EC tablet 40 mg  40 mg Oral Daily Shalhoub, Deno Lunger, MD   40 mg at 01/17/21 1020  . polyethylene glycol (MIRALAX / GLYCOLAX) packet 17 g  17 g Oral BID Candelaria Stagers T, MD   17 g at 01/17/21 1030  . risperiDONE (RISPERDAL) tablet 0.5 mg  0.5 mg Oral QHS Shalhoub, Deno Lunger, MD   0.5 mg at 01/16/21 2218  . rosuvastatin (CRESTOR) tablet 5 mg  5 mg Oral Daily Marinda Elk, MD   5 mg at 01/17/21 1020  . senna-docusate (Senokot-S) tablet 1 tablet  1 tablet  Oral BID Almon Hercules, MD   1 tablet at 01/17/21 1021  . sodium chloride (OCEAN) 0.65 % nasal spray 1 spray  1 spray Each Nare PRN Almon Hercules, MD   1 spray at 01/13/21 1701     Discharge Medications: Please see discharge summary for  a list of discharge medications.  Relevant Imaging Results:  Relevant Lab Results:   Additional Information SSN: [id number]  Amada Jupiter, LCSW

## 2021-01-17 NOTE — Progress Notes (Signed)
PROGRESS NOTE  Sheila Mccoy VOH:[id number] DOB: Jun 12, 1931   PCP: Caesar Bookman, NP  Patient is from: ILF  DOA: 01/11/2021 LOS: 0  Chief complaints: Accidental fall and right chest pain  Brief Narrative / Interim history: 87 year old F with PMH of chronic pulmonary fibrosis/RF on 2 L, diastolic CHF, hypothyroidism and anxiety and depression brought to ED with right chest pain after she had accidental fall.  X-ray showed mildly displaced right eighth rib fracture without pneumothorax or effusion.  Therapy recommended SNF but patient and family initially refused SNF and opted for ALF.  Now they changed their mind and waiting on SNF bed.  Subjective: Seen and examined earlier this morning.  No major events overnight of this morning.  Reports significant improvement in her pain.  Denies shortness of breath.  Patient's daughter at bedside.  We have gone over.  Thoracic spine x-ray and pain management.   Objective: Vitals:   01/16/21 2106 01/17/21 0507 01/17/21 0755 01/17/21 1340  BP: 133/81 124/78  (!) 125/59  Pulse: 64 60  (!) 57  Resp: 15 17  16   Temp: 97.8 F (36.6 C) 97.6 F (36.4 C)  (!) 97.3 F (36.3 C)  TempSrc: Oral Oral    SpO2: 97% 94% 92% 98%  Weight:      Height:        Intake/Output Summary (Last 24 hours) at 01/17/2021 1716 Last data filed at 01/17/2021 1400 Gross per 24 hour  Intake 1010 ml  Output 950 ml  Net 60 ml   Filed Weights   01/11/21 1754  Weight: 65.3 kg    Examination:  GENERAL: No apparent distress.  Nontoxic. HEENT: MMM.  Vision and hearing grossly intact.  NECK: Supple.  No apparent JVD.  RESP: 94% on 2.5 L.  No IWOB.  Fair aeration bilaterally. CVS:  RRR. Heart sounds normal.  ABD/GI/GU: BS+. Abd soft, NTND.  MSK/EXT:  Moves extremities. No apparent deformity.  Trace edema bilaterally. SKIN: no apparent skin lesion or wound NEURO: Awake, alert and oriented appropriately.  No apparent focal neuro deficit. PSYCH: Calm. Normal  affect.  Procedures:  None  Microbiology summarized: Influenza PCR and COVID-19 nonreactive.  Assessment & Plan: Accidental fall at independent living facility Traumatic closed fracture of one rib of right side with minimal displacement, initial encounter Chronic T6 and L1 compression fracture Acute back pain likely due to the above-improved with Robaxin. -Encourage incentive spirometry, OOB/PT/OT -Pain control regimen-scheduled Tylenol and topical Voltaren gel.   -As needed Robaxin, oxycodone, fentanyl based on severity of pain.  Robaxin seems to have helped a lot. -Scheduled MiraLAX and Senokot-S until she has BM. -Add p.o. mag citrate as needed for severe constipation -Encouraged to get out of the bed and sit on the chair.  Pulmonary fibrosis/chronic hypoxic RF: Wears 2 L at baseline. -Encourage incentive spirometry and flutter valve as above -Continue home breathing treatments  Chronic diastolic heart failure: TTE in 09/2020 with LVEF of 60 to 65%, G2-DD. About 1 L UOP/24 hours.  LE edema improved.  Cr slightly up but about baseline. -Spaced out p.o. Lasix to 20 mg every other day. -Monitor fluid status, renal functions and electrolytes  Hyponatremia: Na 131.  Relatively stable. -Continue monitoring  Hypothyroidism: Stable -Continue Synthroid.  Depression: Stable -Continue Risperdal and Wellbutrin -Decreased Remeron to 7.5 mg nightly on 4/10  GERD without esophagitis: -Continue PPI  Nasal congestion: Likely due to nasal cannula.  Improved. -Continue Claritin, Flonase and saline nasal spray  Hyperkalemia: Resolved.  Debility/physical  deconditioning: Ambulates using walker at baseline.  She is currently very deconditioned -Continue PT/OT   Body mass index is 25.5 kg/m.         DVT prophylaxis:  enoxaparin (LOVENOX) injection 40 mg Start: 01/12/21 1000  Code Status: DNR/DNI Family Communication: Updated patient's daughter at bedside. Level of care:  Med-Surg Status is: Observation  The patient remains OBS appropriate and will d/c before 2 midnights.  Dispo: The patient is from: ILF              Anticipated d/c is to: SNF              Patient currently is medically stable to d/c.   Difficult to place patient No       Consultants:  None   Sch Meds:  Scheduled Meds: . acetaminophen  1,000 mg Oral Q8H  . buPROPion  75 mg Oral Daily  . clopidogrel  75 mg Oral Daily  . diclofenac Sodium  2 g Topical QID  . docusate sodium  100 mg Oral Daily  . donepezil  5 mg Oral QHS  . dorzolamide-timolol  1 drop Left Eye BID  . enoxaparin (LOVENOX) injection  40 mg Subcutaneous Q24H  . ezetimibe  10 mg Oral Daily  . fluticasone  1 spray Each Nare Daily  . fluticasone furoate-vilanterol  1 puff Inhalation Daily  . [START ON 01/18/2021] furosemide  20 mg Oral QODAY  . guaiFENesin  600 mg Oral BID  . latanoprost  1 drop Left Eye QHS  . levothyroxine  68.5 mcg Oral Q0600  . lidocaine  1 patch Transdermal Q24H  . loratadine  10 mg Oral Daily  . mouth rinse  15 mL Mouth Rinse BID  . mirtazapine  7.5 mg Oral QHS  . pantoprazole  40 mg Oral Daily  . polyethylene glycol  17 g Oral BID  . risperiDONE  0.5 mg Oral QHS  . rosuvastatin  5 mg Oral Daily  . senna-docusate  1 tablet Oral BID   Continuous Infusions: PRN Meds:.[DISCONTINUED] oxyCODONE-acetaminophen **OR** fentaNYL (SUBLIMAZE) injection, ipratropium-albuterol, magnesium citrate, methocarbamol, ondansetron **OR** ondansetron (ZOFRAN) IV, oxyCODONE, sodium chloride  Antimicrobials: Anti-infectives (From admission, onward)   None       I have personally reviewed the following labs and images: CBC: Recent Labs  Lab 01/11/21 2101 01/12/21 0936 01/16/21 0330  WBC 15.3* 12.2* 10.1  NEUTROABS 12.3* 9.6*  --   HGB 13.1 12.9 12.5  HCT 39.3 40.2 38.1  MCV 90.8 93.3 92.3  PLT 383 344 356   BMP &GFR Recent Labs  Lab 01/12/21 0703 01/14/21 0733 01/15/21 0459 01/16/21 0330  01/17/21 0426  NA 133* 131* 130* 131* 129*  K 3.8 3.8 3.9 3.8 3.8  CL 99 99 97* 97* 94*  CO2 23 25 23 22 27   GLUCOSE 122* 98 95 89 98  BUN 15 19 21  24* 23  CREATININE 0.69 0.51 0.79 0.79 0.92  CALCIUM 9.2 8.9 9.0 9.0 9.0  MG 1.7 1.7 1.8 1.7 2.0  PHOS  --  2.6 3.0 2.8 3.1   Estimated Creatinine Clearance: 38.4 mL/min (by C-G formula based on SCr of 0.92 mg/dL). Liver & Pancreas: Recent Labs  Lab 01/11/21 2101 01/12/21 0703 01/14/21 0733 01/15/21 0459 01/16/21 0330 01/17/21 0426  AST 64* 34  --   --   --   --   ALT 28 28  --   --   --   --   ALKPHOS 107 99  --   --   --   --  BILITOT 0.7 0.6  --   --   --   --   PROT 8.0 7.3  --   --   --   --   ALBUMIN 4.3 3.8 3.3* 3.5 3.3* 3.3*   No results for input(s): LIPASE, AMYLASE in the last 168 hours. No results for input(s): AMMONIA in the last 168 hours. Diabetic: No results for input(s): HGBA1C in the last 72 hours. Recent Labs  Lab 01/13/21 0744  GLUCAP 105*   Cardiac Enzymes: No results for input(s): CKTOTAL, CKMB, CKMBINDEX, TROPONINI in the last 168 hours. No results for input(s): PROBNP in the last 8760 hours. Coagulation Profile: Recent Labs  Lab 01/12/21 0703  INR 1.0   Thyroid Function Tests: No results for input(s): TSH, T4TOTAL, FREET4, T3FREE, THYROIDAB in the last 72 hours. Lipid Profile: No results for input(s): CHOL, HDL, LDLCALC, TRIG, CHOLHDL, LDLDIRECT in the last 72 hours. Anemia Panel: No results for input(s): VITAMINB12, FOLATE, FERRITIN, TIBC, IRON, RETICCTPCT in the last 72 hours. Urine analysis:    Component Value Date/Time   COLORURINE YELLOW 09/29/2020 1750   APPEARANCEUR CLEAR 09/29/2020 1750   LABSPEC 1.010 09/29/2020 1750   PHURINE 6.0 09/29/2020 1750   GLUCOSEU NEGATIVE 09/29/2020 1750   HGBUR NEGATIVE 09/29/2020 1750   BILIRUBINUR NEGATIVE 09/29/2020 1750   KETONESUR NEGATIVE 09/29/2020 1750   PROTEINUR NEGATIVE 09/29/2020 1750   NITRITE NEGATIVE 09/29/2020 1750    LEUKOCYTESUR NEGATIVE 09/29/2020 1750   Sepsis Labs: Invalid input(s): PROCALCITONIN, LACTICIDVEN  Microbiology: Recent Results (from the past 240 hour(s))  Resp Panel by RT-PCR (Flu A&B, Covid) Nasopharyngeal Swab     Status: None   Collection Time: 01/11/21 10:56 PM   Specimen: Nasopharyngeal Swab; Nasopharyngeal(NP) swabs in vial transport medium  Result Value Ref Range Status   SARS Coronavirus 2 by RT PCR NEGATIVE NEGATIVE Final    Comment: (NOTE) SARS-CoV-2 target nucleic acids are NOT DETECTED.  The SARS-CoV-2 RNA is generally detectable in upper respiratory specimens during the acute phase of infection. The lowest concentration of SARS-CoV-2 viral copies this assay can detect is 138 copies/mL. A negative result does not preclude SARS-Cov-2 infection and should not be used as the sole basis for treatment or other patient management decisions. A negative result may occur with  improper specimen collection/handling, submission of specimen other than nasopharyngeal swab, presence of viral mutation(s) within the areas targeted by this assay, and inadequate number of viral copies(<138 copies/mL). A negative result must be combined with clinical observations, patient history, and epidemiological information. The expected result is Negative.  Fact Sheet for Patients:  BloggerCourse.com  Fact Sheet for Healthcare Providers:  SeriousBroker.it  This test is no t yet approved or cleared by the [place] FDA and  has been authorized for detection and/or diagnosis of SARS-CoV-2 by FDA under an Emergency Use Authorization (EUA). This EUA will remain  in effect (meaning this test can be used) for the duration of the COVID-19 declaration under Section 564(b)(1) of the Act, 21 U.S.C.section 360bbb-3(b)(1), unless the authorization is terminated  or revoked sooner.       Influenza A by PCR NEGATIVE NEGATIVE Final   Influenza B by  PCR NEGATIVE NEGATIVE Final    Comment: (NOTE) The Xpert Xpress SARS-CoV-2/FLU/RSV plus assay is intended as an aid in the diagnosis of influenza from Nasopharyngeal swab specimens and should not be used as a sole basis for treatment. Nasal washings and aspirates are unacceptable for Xpert Xpress SARS-CoV-2/FLU/RSV testing.  Fact Sheet for Patients: BloggerCourse.com  Fact Sheet for Healthcare Providers: SeriousBroker.ithttps://www.fda.gov/media/152162/download  This test is not yet approved or cleared by the [place] FDA and has been authorized for detection and/or diagnosis of SARS-CoV-2 by FDA under an Emergency Use Authorization (EUA). This EUA will remain in effect (meaning this test can be used) for the duration of the COVID-19 declaration under Section 564(b)(1) of the Act, 21 U.S.C. section 360bbb-3(b)(1), unless the authorization is terminated or revoked.  Performed at [facility]   SARS CORONAVIRUS 2 (TAT 6-24 HRS) Nasopharyngeal Nasopharyngeal Swab     Status: None   Collection Time: 01/15/21  5:00 AM   Specimen: Nasopharyngeal Swab  Result Value Ref Range Status   SARS Coronavirus 2 NEGATIVE NEGATIVE Final    Comment: (NOTE) SARS-CoV-2 target nucleic acids are NOT DETECTED.  The SARS-CoV-2 RNA is generally detectable in upper and lower respiratory specimens during the acute phase of infection. Negative results do not preclude SARS-CoV-2 infection, do not rule out co-infections with other pathogens, and should not be used as the sole basis for treatment or other patient management decisions. Negative results must be combined with clinical observations, patient history, and epidemiological information. The expected result is Negative.  Fact Sheet for Patients: HairSlick.nohttps://www.fda.gov/media/138098/download  Fact Sheet for Healthcare Providers: quierodirigir.comhttps://www.fda.gov/media/138095/download  This test is not yet approved or cleared by the  [place] FDA and  has been authorized for detection and/or diagnosis of SARS-CoV-2 by FDA under an Emergency Use Authorization (EUA). This EUA will remain  in effect (meaning this test can be used) for the duration of the COVID-19 declaration under Se ction 564(b)(1) of the Act, 21 U.S.C. section 360bbb-3(b)(1), unless the authorization is terminated or revoked sooner.  Performed at [facility], [address] [place], [place] [address]     Radiology Studies: No results found.    Kemauri Musa T. Collen Vincent [facility]  If 7PM-7AM, please contact night-coverage [email or web address] 01/17/2021, 5:16 PM

## 2021-01-18 DIAGNOSIS — I5032 Chronic diastolic (congestive) heart failure: Secondary | ICD-10-CM | POA: Diagnosis not present

## 2021-01-18 DIAGNOSIS — J9611 Chronic respiratory failure with hypoxia: Secondary | ICD-10-CM | POA: Diagnosis not present

## 2021-01-18 DIAGNOSIS — W19XXXA Unspecified fall, initial encounter: Secondary | ICD-10-CM | POA: Diagnosis not present

## 2021-01-18 DIAGNOSIS — S2231XA Fracture of one rib, right side, initial encounter for closed fracture: Secondary | ICD-10-CM | POA: Diagnosis not present

## 2021-01-18 LAB — RENAL FUNCTION PANEL
Albumin: 3.5 g/dL (ref 3.5–5.0)
Anion gap: 8 (ref 5–15)
BUN: 18 mg/dL (ref 8–23)
CO2: 27 mmol/L (ref 22–32)
Calcium: 9.4 mg/dL (ref 8.9–10.3)
Chloride: 97 mmol/L — ABNORMAL LOW (ref 98–111)
Creatinine, Ser: 0.67 mg/dL (ref 0.44–1.00)
GFR, Estimated: >60 mL/min (ref >60)
Glucose, Bld: 101 mg/dL — ABNORMAL HIGH (ref 70–99)
Phosphorus: 2.9 mg/dL (ref 2.5–4.6)
Potassium: 3.8 mmol/L (ref 3.5–5.1)
Sodium: 132 mmol/L — ABNORMAL LOW (ref 135–145)

## 2021-01-18 LAB — COMPREHENSIVE METABOLIC PANEL
ALT: 28 U/L (ref 0–44)
AST: 64 U/L — ABNORMAL HIGH (ref 15–41)
Albumin: 4.3 g/dL (ref 3.5–5.0)
Alkaline Phosphatase: 107 U/L (ref 38–126)
Anion gap: 8 (ref 5–15)
BUN: 18 mg/dL (ref 8–23)
CO2: 27 mmol/L (ref 22–32)
Calcium: 9.7 mg/dL (ref 8.9–10.3)
Chloride: 97 mmol/L — ABNORMAL LOW (ref 98–111)
Creatinine, Ser: 0.81 mg/dL (ref 0.44–1.00)
GFR, Estimated: >60 mL/min (ref >60)
Glucose, Bld: 128 mg/dL — ABNORMAL HIGH (ref 70–99)
Potassium: 6 mmol/L — ABNORMAL HIGH (ref 3.5–5.1)
Sodium: 132 mmol/L — ABNORMAL LOW (ref 135–145)
Total Bilirubin: 0.7 mg/dL (ref 0.3–1.2)
Total Protein: 8 g/dL (ref 6.5–8.1)

## 2021-01-18 LAB — RESP PANEL BY RT-PCR (FLU A&B, COVID) ARPGX2
Influenza A by PCR: NEGATIVE
Influenza B by PCR: NEGATIVE
SARS Coronavirus 2 by RT PCR: NEGATIVE

## 2021-01-18 LAB — QUANTIFERON-TB GOLD PLUS: QuantiFERON-TB Gold Plus: NEGATIVE

## 2021-01-18 LAB — MAGNESIUM: Magnesium: 1.9 mg/dL (ref 1.7–2.4)

## 2021-01-18 LAB — QUANTIFERON-TB GOLD PLUS (RQFGPL)
QuantiFERON Mitogen Value: >10 IU/mL
QuantiFERON Nil Value: 0.09 IU/mL
QuantiFERON TB1 Ag Value: 0.08 IU/mL
QuantiFERON TB2 Ag Value: 0.08 IU/mL

## 2021-01-18 MED ORDER — METHOCARBAMOL 500 MG PO TABS: 500.0000 mg | ORAL_TABLET | Freq: Three times a day (TID) | ORAL | 0 refills | Status: DC | PRN

## 2021-01-18 MED ORDER — EZETIMIBE 10 MG PO TABS: 10.0000 mg | ORAL_TABLET | Freq: Every day | ORAL | Status: DC

## 2021-01-18 MED ORDER — MIRTAZAPINE 7.5 MG PO TABS: 7.5000 mg | ORAL_TABLET | Freq: Every day | ORAL | Status: DC

## 2021-01-18 MED ORDER — MAGNESIUM CITRATE PO SOLN: 1.0000 | Freq: Every day | ORAL | Status: DC | PRN

## 2021-01-18 MED ORDER — POLYETHYLENE GLYCOL 3350 17 G PO PACK: 17.0000 g | PACK | Freq: Two times a day (BID) | ORAL | 0 refills | Status: AC | PRN

## 2021-01-18 MED ORDER — LORATADINE 10 MG PO TABS: 10.0000 mg | ORAL_TABLET | Freq: Every day | ORAL | Status: AC

## 2021-01-18 MED ORDER — OXYCODONE HCL 5 MG PO TABS: 5.0000 mg | ORAL_TABLET | Freq: Four times a day (QID) | ORAL | 0 refills | Status: AC | PRN

## 2021-01-18 MED ORDER — FLUTICASONE PROPIONATE 50 MCG/ACT NA SUSP
1.0000 | Freq: Every day | NASAL | 2 refills | Status: AC
Start: 2021-01-18 — End: ?

## 2021-01-18 MED ORDER — ACETAMINOPHEN 500 MG PO TABS: 1000.0000 mg | ORAL_TABLET | Freq: Three times a day (TID) | ORAL | 1 refills | Status: AC

## 2021-01-18 MED ORDER — SENNOSIDES-DOCUSATE SODIUM 8.6-50 MG PO TABS: 1.0000 | ORAL_TABLET | Freq: Two times a day (BID) | ORAL | Status: DC | PRN

## 2021-01-18 NOTE — TOC Transition Note (Signed)
Transition of Care [facility]) - CM/SW Discharge Note  Patient Details  Name: Sheila Mccoy MRN: [id number] Date of Birth: [birth year]  [facility] CM/SW Contact:  Ewing Schlein, LCSW Phone Number: 01/18/2021, 12:52 PM  Clinical Narrative: Patient to be discharged to SNF today. CSW spoke with patient's daughter, Lacretia Nicks, who told CSW she called to appeal patient's discharge. CSW explained that as the patient is under observation, the discharge cannot be appealed per [employer] policy.  CSW reviewed bed offers and family is requesting [facility] in [place]. Per Prince Solian with [facility], the facility can accept the patient today pending insurance authorization and a negative COVID test. Updated PT note faxed to [facility] for insurance authorization. Patient has been approved for SNF for 5 days with a start date of 01/18/21 and the next review date is 01/22/21.  Discharge summary and SNF transfer report sent to Ms. Harrold Donath. Patient will go to room 768 and the number for report is [phone]. COVID test is negative. Medical necessity form done; PTAR scheduled. Discharge packet completed. RN updated. CSW notified daughter of discharge. TOC signing off.  Final next level of care: Skilled Nursing Facility Barriers to Discharge: Barriers Resolved  Patient Goals and CMS Choice Patient states their goals for this hospitalization and ongoing recovery are:: Discharge to [facility] for rehab CMS Medicare.gov Compare Post Acute Care list provided to:: Patient Represenative (must comment) Choice offered to / list presented to : Adult Children  Discharge Placement Existing PASRR number confirmed : 01/17/21  Patient to be transferred to facility by: PTAR Name of family member notified: Deidre Kevorkian Patient and family notified of of transfer: 01/18/21  Discharge Plan and Services In-house Referral: Clinical Social Work       DME Arranged: N/A DME Agency:  NA  Readmission Risk Interventions No flowsheet data found.

## 2021-01-18 NOTE — Discharge Summary (Signed)
Physician Discharge Summary  Noel Journeyleanor Macdougal HQI:[id number] DOB: 07/17/1931 DOA: 01/11/2021  PCP: Caesar BookmanNgetich, Dinah C, NP  Admit date: 01/11/2021 Discharge date: 01/18/2021  Admitted From: ILF Disposition: SNF  Recommendations for Outpatient Follow-up:  1. Follow up with PCP or SNF provider in 1 week 2. Please obtain CBC/BMP/Mag at follow up 3. Please follow up on the following pending results:   Discharge Condition: Stable CODE STATUS: DNR/DNI   Hospital Course: 87 year old F with PMH of chronic pulmonary fibrosis/RF on 2 L, diastolic CHF, hypothyroidism and anxiety and depression brought to ED with right chest pain after she had accidental fall.  X-ray showed mildly displaced right eighth rib fracture without pneumothorax or effusion.  Therapy recommended SNF but patient and family initially refused SNF and opted for ALF.   However, family changed their mind and patient is discharged to SNF.    See individual problem list below for more hospital course.   Discharge Diagnoses:  Accidental fall at independent living facility Traumatic closed fracture of one rib of right side with minimal displacement, initial encounter Chronic T6 and L1 compression fracture Acute back pain likely due to the above-improved with Robaxin. -Pain control regimen-scheduled Tylenol and topical Voltaren gel.   -As needed Robaxin and oxycodone for moderate to severe pain. -MiraLAX, Senokot-S and mag citrate as needed for constipation -Encouraged to get out of the bed and sit on the chair. -Encourage incentive spirometry, OOB/PT/OT  Pulmonary fibrosis/chronic hypoxic RF: Wears 2 L at baseline. -Encourage incentive spirometry and flutter valve as above -Continue home breathing treatments  Chronic diastolic heart failure: TTE in 09/2020 with LVEF of 60 to 65%, G2-DD.  About 800 cc UOP/24 hours +2 unmeasured voids.  Net -1 L. LE edema resolved. Cr at baseline.  Stable on home 2 L via [place]. -Spaced out p.o. Lasix  to 20 mg every other day. -Monitor fluid status, respiratory status -Check renal function and magnesium in 1 week  Hyponatremia: Na 132.  Relatively stable. -Recheck in 1 week.  Hypothyroidism: Stable -Continue Synthroid.  Depression: Stable -Continue Risperdal and Wellbutrin -Decreased Remeron to 7.5 mg nightly on 4/10  GERD without esophagitis: -Continue home Protonix  Nasal congestion: Likely due to nasal cannula.  Improved. -Continue Claritin, Flonase and saline nasal spray  Hyperkalemia: Resolved.  Constipation: Resolved.  Had 2 bowel movements last night. -Bowel regimen as above  Debility/physical deconditioning: Ambulates using walker at baseline.  She is currently very deconditioned -Continue PT/OT at SNF.   Body mass index is 25.5 kg/m.       Family communication: Updated patient's daughter over the phone on the day of discharge.     Discharge Exam: Vitals:   01/18/21 0505 01/18/21 0810  BP: (!) 131/56   Pulse: 65   Resp: 16   Temp: (!) 97.5 F (36.4 C)   SpO2: 96% 92%    GENERAL: No apparent distress.  Nontoxic. HEENT: MMM.  Vision and hearing grossly intact.  NECK: Supple.  No apparent JVD.  RESP: On 2 L.  No IWOB.  Fair aeration bilaterally. CVS:  RRR. Heart sounds normal.  ABD/GI/GU: Bowel sounds present. Soft. Non tender.  MSK/EXT:  Moves extremities.  Tenderness over right eighth rib posterolaterally. SKIN: no apparent skin lesion or wound NEURO: Awake, alert and oriented appropriately.  No apparent focal neuro deficit. PSYCH: Calm. Normal affect.  Discharge Instructions  Discharge Instructions    (HEART FAILURE PATIENTS) Call MD:  Anytime you have any of the following symptoms: 1) 3 pound weight  gain in 24 hours or 5 pounds in 1 week 2) shortness of breath, with or without a dry hacking cough 3) swelling in the hands, feet or stomach 4) if you have to sleep on extra pillows at night in order to breathe.   Complete by: As directed     Call MD for:  difficulty breathing, headache or visual disturbances   Complete by: As directed    Call MD for:  extreme fatigue   Complete by: As directed    Call MD for:  severe uncontrolled pain   Complete by: As directed    Diet - low sodium heart healthy   Complete by: As directed    Increase activity slowly   Complete by: As directed      Allergies as of 01/18/2021      Reactions   Crestor [rosuvastatin]    Lower extremity weakness, gait instability and confusion   Hydromorphone    Very Disoriented & Confused   Lipitor [atorvastatin]    Muscle paralysis.   Macrobid [nitrofurantoin] Other (See Comments)   tired   Morphine And Related       Medication List    STOP taking these medications   ALPRAZolam 0.5 MG tablet Commonly known as: XANAX   VITAMIN E PO     TAKE these medications   acetaminophen 500 MG tablet Commonly known as: TYLENOL Take 500-1,000 mg by mouth every 6 (six) hours as needed for mild pain.   albuterol 108 (90 Base) MCG/ACT inhaler Commonly known as: VENTOLIN HFA Inhale 1-2 puffs into the lungs every 6 (six) hours as needed for wheezing or shortness of breath.   budesonide-formoterol 80-4.5 MCG/ACT inhaler Commonly known as: SYMBICORT Inhale 2 puffs into the lungs 2 (two) times daily.   buPROPion 75 MG tablet Commonly known as: WELLBUTRIN Take 75 mg by mouth daily.   CALCIUM MAGNESIUM PO Take 1 capsule by mouth 2 (two) times daily.   clopidogrel 75 MG tablet Commonly known as: PLAVIX Take 1 tablet (75 mg total) by mouth daily.   CO Q 10 PO Take 120 mg by mouth 2 (two) times daily.   diclofenac Sodium 1 % Gel Commonly known as: VOLTAREN Apply 2 g topically 4 (four) times daily as needed (pain).   donepezil 5 MG tablet Commonly known as: ARICEPT Take 1 tablet (5 mg total) by mouth at bedtime.   dorzolamide-timolol 22.3-6.8 MG/ML ophthalmic solution Commonly known as: COSOPT Place 1 drop into the left eye 2 (two) times daily.    ezetimibe 10 MG tablet Commonly known as: ZETIA Take 1 tablet (10 mg total) by mouth daily.   fluticasone 50 MCG/ACT nasal spray Commonly known as: FLONASE Place 1 spray into both nostrils daily.   furosemide 20 MG tablet Commonly known as: LASIX Take 20 mg by mouth every other day.   latanoprost 0.005 % ophthalmic solution Commonly known as: XALATAN Place 1 drop into both eyes at bedtime.   levothyroxine 137 MCG tablet Commonly known as: SYNTHROID Take 68.5 mcg by mouth daily before breakfast. 1/2 tablet on empty stomach 1 hour before a meal with glass of water.   loratadine 10 MG tablet Commonly known as: CLARITIN Take 1 tablet (10 mg total) by mouth daily. Start taking on: January 19, 2021   magnesium citrate Soln Take 296 mLs (1 Bottle total) by mouth daily as needed for severe constipation.   methocarbamol 500 MG tablet Commonly known as: ROBAXIN Take 1 tablet (500 mg total) by mouth  every 8 (eight) hours as needed for muscle spasms.   mirtazapine 7.5 MG tablet Commonly known as: REMERON Take 1 tablet (7.5 mg total) by mouth at bedtime. What changed:   medication strength  how much to take   OVER THE COUNTER MEDICATION Take 1 drop by mouth daily. Vitamin B12   oxyCODONE 5 MG immediate release tablet Commonly known as: Oxy IR/ROXICODONE Take 1 tablet (5 mg total) by mouth every 6 (six) hours as needed for up to 7 days for moderate pain or severe pain.   pantoprazole 40 MG tablet Commonly known as: PROTONIX Take 40 mg by mouth daily.   polyethylene glycol 17 g packet Commonly known as: MIRALAX / GLYCOLAX Take 17 g by mouth 2 (two) times daily as needed for mild constipation.   risperiDONE 0.5 MG tablet Commonly known as: RISPERDAL Take 0.5 mg by mouth at bedtime.   senna-docusate 8.6-50 MG tablet Commonly known as: Senokot-S Take 1 tablet by mouth 2 (two) times daily as needed for moderate constipation.   VITAMIN D3 PO Take 1,000 mg by mouth 2 (two)  times daily as needed.       Consultations:  None  Procedures/Studies:   DG Ribs Unilateral W/Chest Right  Result Date: 01/11/2021 CLINICAL DATA:  Fall, back pain EXAM: RIGHT RIBS AND CHEST - 3+ VIEW COMPARISON:  07/11/2020 FINDINGS: Lateral right 8th rib fracture, mildly displaced. No pneumothorax or effusion. No confluent airspace opacities. Heart is normal size. IMPRESSION: Right lateral 8th rib fracture. No effusion or pneumothorax. Electronically Signed   By: Charlett Nose M.D.   On: 01/11/2021 19:14   DG Thoracic Spine 2 View  Result Date: 01/16/2021 CLINICAL DATA:  87 year old female with pain across the upper back. Recent fall. Right lateral 8th rib fracture. EXAM: THORACIC SPINE 2 VIEWS COMPARISON:  Right rib series 01/11/2021.  CTA chest 07/09/2020. FINDINGS: Osteopenia. Cross-table lateral views had to be utilized, with suboptimal bone detail. Chronic T6 compression fracture chronic L1 inferior endplate compression fracture also not significantly changed from October. Other thoracic vertebral bodies appear to remain intact. Grossly maintained cervicothoracic junction alignment. Small to moderate layering left pleural effusion is visible. No pneumothorax identified. IMPRESSION: 1. Osteopenia with no acute osseous abnormality identified in the thoracic spine. Chronic T6 and L1 compression fractures. If occult thoracic compression fracture is suspected then either noncontrast Thoracic MRI or nuclear Medicine whole-body Bone scan would evaluate with the highest sensitivity. 2. Small to moderate left pleural effusion is visible and new since October. Electronically Signed   By: Odessa Fleming M.D.   On: 01/16/2021 11:33        The results of significant diagnostics from this hospitalization (including imaging, microbiology, ancillary and laboratory) are listed below for reference.     Microbiology: Recent Results (from the past 240 hour(s))  Resp Panel by RT-PCR (Flu A&B, Covid)  Nasopharyngeal Swab     Status: None   Collection Time: 01/11/21 10:56 PM   Specimen: Nasopharyngeal Swab; Nasopharyngeal(NP) swabs in vial transport medium  Result Value Ref Range Status   SARS Coronavirus 2 by RT PCR NEGATIVE NEGATIVE Final    Comment: (NOTE) SARS-CoV-2 target nucleic acids are NOT DETECTED.  The SARS-CoV-2 RNA is generally detectable in upper respiratory specimens during the acute phase of infection. The lowest concentration of SARS-CoV-2 viral copies this assay can detect is 138 copies/mL. A negative result does not preclude SARS-Cov-2 infection and should not be used as the sole basis for treatment or other patient management  decisions. A negative result may occur with  improper specimen collection/handling, submission of specimen other than nasopharyngeal swab, presence of viral mutation(s) within the areas targeted by this assay, and inadequate number of viral copies(<138 copies/mL). A negative result must be combined with clinical observations, patient history, and epidemiological information. The expected result is Negative.  Fact Sheet for Patients:  BloggerCourse.com  Fact Sheet for Healthcare Providers:  SeriousBroker.it  This test is no t yet approved or cleared by the [place] FDA and  has been authorized for detection and/or diagnosis of SARS-CoV-2 by FDA under an Emergency Use Authorization (EUA). This EUA will remain  in effect (meaning this test can be used) for the duration of the COVID-19 declaration under Section 564(b)(1) of the Act, 21 U.S.C.section 360bbb-3(b)(1), unless the authorization is terminated  or revoked sooner.       Influenza A by PCR NEGATIVE NEGATIVE Final   Influenza B by PCR NEGATIVE NEGATIVE Final    Comment: (NOTE) The Xpert Xpress SARS-CoV-2/FLU/RSV plus assay is intended as an aid in the diagnosis of influenza from Nasopharyngeal swab specimens and should not be  used as a sole basis for treatment. Nasal washings and aspirates are unacceptable for Xpert Xpress SARS-CoV-2/FLU/RSV testing.  Fact Sheet for Patients: BloggerCourse.com  Fact Sheet for Healthcare Providers: SeriousBroker.it  This test is not yet approved or cleared by the [place] FDA and has been authorized for detection and/or diagnosis of SARS-CoV-2 by FDA under an Emergency Use Authorization (EUA). This EUA will remain in effect (meaning this test can be used) for the duration of the COVID-19 declaration under Section 564(b)(1) of the Act, 21 U.S.C. section 360bbb-3(b)(1), unless the authorization is terminated or revoked.  Performed at [facility]   SARS CORONAVIRUS 2 (TAT 6-24 HRS) Nasopharyngeal Nasopharyngeal Swab     Status: None   Collection Time: 01/15/21  5:00 AM   Specimen: Nasopharyngeal Swab  Result Value Ref Range Status   SARS Coronavirus 2 NEGATIVE NEGATIVE Final    Comment: (NOTE) SARS-CoV-2 target nucleic acids are NOT DETECTED.  The SARS-CoV-2 RNA is generally detectable in upper and lower respiratory specimens during the acute phase of infection. Negative results do not preclude SARS-CoV-2 infection, do not rule out co-infections with other pathogens, and should not be used as the sole basis for treatment or other patient management decisions. Negative results must be combined with clinical observations, patient history, and epidemiological information. The expected result is Negative.  Fact Sheet for Patients: HairSlick.no  Fact Sheet for Healthcare Providers: quierodirigir.com  This test is not yet approved or cleared by the [place] FDA and  has been authorized for detection and/or diagnosis of SARS-CoV-2 by FDA under an Emergency Use Authorization (EUA). This EUA will remain  in effect (meaning this test can be  used) for the duration of the COVID-19 declaration under Se ction 564(b)(1) of the Act, 21 U.S.C. section 360bbb-3(b)(1), unless the authorization is terminated or revoked sooner.  Performed at [facility], [address], [place], [place] [address]      Labs:  CBC: Recent Labs  Lab 01/11/21 2101 01/12/21 0936 01/16/21 0330  WBC 15.3* 12.2* 10.1  NEUTROABS 12.3* 9.6*  --   HGB 13.1 12.9 12.5  HCT 39.3 40.2 38.1  MCV 90.8 93.3 92.3  PLT 383 344 356   BMP &GFR Recent Labs  Lab 01/14/21 0733 01/15/21 0459 01/16/21 0330 01/17/21 0426 01/18/21 0426  NA 131* 130* 131* 129* 132*  K 3.8 3.9 3.8 3.8 3.8  CL 99 97* 97* 94* 97*  CO2 GLUCOSE 98 95 89 98 101*  BUN 19 21 24* 23 18  CREATININE 0.51 0.79 0.79 0.92 0.67  CALCIUM 8.9 9.0 9.0 9.0 9.4  MG 1.7 1.8 1.7 2.0 1.9  PHOS 2.6 3.0 2.8 3.1 2.9   Estimated Creatinine Clearance: 44.2 mL/min (by C-G formula based on SCr of 0.67 mg/dL). Liver & Pancreas: Recent Labs  Lab 01/11/21 2101 01/12/21 0703 01/14/21 0733 01/15/21 0459 01/16/21 0330 01/17/21 0426 01/18/21 0426  AST 64* 34  --   --   --   --   --   ALT 28 28  --   --   --   --   --   ALKPHOS 107 99  --   --   --   --   --   BILITOT 0.7 0.6  --   --   --   --   --   PROT 8.0 7.3  --   --   --   --   --   ALBUMIN 4.3 3.8 3.3* 3.5 3.3* 3.3* 3.5   No results for input(s): LIPASE, AMYLASE in the last 168 hours. No results for input(s): AMMONIA in the last 168 hours. Diabetic: No results for input(s): HGBA1C in the last 72 hours. Recent Labs  Lab 01/13/21 0744  GLUCAP 105*   Cardiac Enzymes: No results for input(s): CKTOTAL, CKMB, CKMBINDEX, TROPONINI in the last 168 hours. No results for input(s): PROBNP in the last 8760 hours. Coagulation Profile: Recent Labs  Lab 01/12/21 0703  INR 1.0   Thyroid Function Tests: No results for input(s): TSH, T4TOTAL, FREET4, T3FREE, THYROIDAB in the last 72 hours. Lipid Profile: No results for  input(s): CHOL, HDL, LDLCALC, TRIG, CHOLHDL, LDLDIRECT in the last 72 hours. Anemia Panel: No results for input(s): VITAMINB12, FOLATE, FERRITIN, TIBC, IRON, RETICCTPCT in the last 72 hours. Urine analysis:    Component Value Date/Time   COLORURINE YELLOW 09/29/2020 1750   APPEARANCEUR CLEAR 09/29/2020 1750   LABSPEC 1.010 09/29/2020 1750   PHURINE 6.0 09/29/2020 1750   GLUCOSEU NEGATIVE 09/29/2020 1750   HGBUR NEGATIVE 09/29/2020 1750   BILIRUBINUR NEGATIVE 09/29/2020 1750   KETONESUR NEGATIVE 09/29/2020 1750   PROTEINUR NEGATIVE 09/29/2020 1750   NITRITE NEGATIVE 09/29/2020 1750   LEUKOCYTESUR NEGATIVE 09/29/2020 1750   Sepsis Labs: Invalid input(s): PROCALCITONIN, LACTICIDVEN   Time coordinating discharge: 40 minutes  SIGNED:  Almon Hercules, MD  [facility] 01/18/2021, 11:49 AM  If 7PM-7AM, please contact night-coverage [email or web address]

## 2021-01-18 NOTE — Progress Notes (Signed)
Physical Therapy Treatment Patient Details Name: Sheila Mccoy MRN: [id number] DOB: Nov 10, 1930 Today's Date: 01/18/2021    History of Present Illness Pt is 87 yo female admitted s/p fall, chest wall pain, with mildly displaced 8th R rib fx.  PMH includes hypothyroidism, anxiety , depression, CHF, pulmonary fibrosis on 2 L O2 at home    PT Comments    Pt in bed on 3 lts nasal at rest 97%. General Comments: AxO x 4 very sweet and good historian/aware of present condition/situation.  Pt stated she lives at the [facility] which is an Indep apartment and that she amb with a Rollator and has been on oxygen for a couple of months. Assisted pt OOB. General bed mobility comments: pt required Max Assist to transition from supine to EOB mainly due to R Lateral Trunk pain (Rib Fx) also required use of bed pad to scoot to EOB with Therapist assist.  General transfer comment: Assist to rise, steady, control descent. Increased time. Cues for safety, technique, hand placement. Assisted from elevated to [facility] then from [facility] twice due to loose strong odor BM.  Assisted with peri care as pt was unable to self perform due to her rib pain.  "It hurts just to breath". General Gait Details: very limited gait tolerance to pain and c/o SOB.  "Am I getting enough oxygen?" stated pt.  Pt on 3 lts nasal at 95% however pt taking very shallow, short breath due to rib pain. Positioned in recliner to comfort.  Assisted with ordering her breakfast.  Reported to RN pt pain level 10/10 and also pt has a strong BM odor which is also loose in consistancy.   Pt will need ST Rehab at SNF to increased her mobilty and Indep prior to returning to her Indep Living.   Follow Up Recommendations  SNF     Equipment Recommendations  None recommended by PT    Recommendations for Other Services       Precautions / Restrictions Precautions Precautions: Fall Precaution Comments: R rib fx. O2 dep (2 lts at rest/3 with activity)     Mobility  Bed Mobility Overal bed mobility: Needs Assistance Bed Mobility: Supine to Sit     Supine to sit: Max assist;HOB elevated     General bed mobility comments: pt required Max Assist to transition from supine to EOB mainly due to R Lateral Trunk pain (Rib Fx) also required use of bed pad to scoot to EOB with Therapist assist.    Transfers Overall transfer level: Needs assistance Equipment used: Rolling walker (2 wheeled);None Transfers: Sit to/from [employer] Sit to Stand: Min assist;From elevated surface Stand pivot transfers: Mod assist       General transfer comment: Assist to rise, steady, control descent. Increased time. Cues for safety, technique, hand placement. Assisted from elevated to [facility] then from [facility] twice due to loose strong odor BM.  Assisted with peri care as pt was unable to self perform due to her rib pain.  "It hurts just to breath".  Ambulation/Gait Ambulation/Gait assistance: Min assist Gait Distance (Feet): 2 Feet Assistive device: Rolling walker (2 wheeled) Gait Pattern/deviations: Step-through pattern;Decreased stride length;Trunk flexed Gait velocity: decreased   General Gait Details: very limited gait tolerance to pain and c/o SOB.  "Am I getting enough oxygen?" stated pt.  Pt on 3 lts nasal at 95% however pt taking very shallow, short breath due to rib pain.   Stairs  Wheelchair Mobility    Modified Rankin (Stroke Patients Only)       Balance                                            Cognition Arousal/Alertness: Awake/alert   Overall Cognitive Status: Within Functional Limits for tasks assessed                                 General Comments: AxO x 4 very sweet and good historian/aware of present condition/situation      Exercises      General Comments        Pertinent Vitals/Pain Pain Assessment: 0-10 Pain Score: 10-Worst pain ever Pain Location: R  side lateral Pain Descriptors / Indicators: Grimacing;Discomfort;Guarding Pain Intervention(s): Monitored during session;Repositioned;Patient requesting pain meds-RN notified    Home Living                      Prior Function            PT Goals (current goals can now be found in the care plan section) Progress towards PT goals: Progressing toward goals    Frequency    Min 3X/week      PT Plan Current plan remains appropriate    Co-evaluation              AM-PAC PT "6 Clicks" Mobility   Outcome Measure  Help needed turning from your back to your side while in a flat bed without using bedrails?: A Lot Help needed moving from lying on your back to sitting on the side of a flat bed without using bedrails?: A Lot Help needed moving to and from a bed to a chair (including a wheelchair)?: A Lot Help needed standing up from a chair using your arms (e.g., wheelchair or bedside chair)?: A Lot Help needed to walk in hospital room?: A Lot Help needed climbing 3-5 steps with a railing? : Total 6 Click Score: 11    End of Session Equipment Utilized During Treatment: Oxygen;Gait belt Activity Tolerance: Patient limited by pain;Other (comment) (dyspnea) Patient left: in chair;with call bell/phone within reach;with chair alarm set Nurse Communication: Mobility status PT Visit Diagnosis: Muscle weakness (generalized) (M62.81);History of falling (Z91.81);Pain;Unsteadiness on feet (R26.81);Difficulty in walking, not elsewhere classified (R26.2) Pain - Right/Left: Right     Time: 0935-1000 PT Time Calculation (min) (ACUTE ONLY): 25 min  Charges:  $Gait Training: 8-22 mins $Therapeutic Activity: 8-22 mins                     Felecia Shelling  PTA Acute  Rehabilitation Services Pager      [phone] Office      [phone]'

## 2021-01-18 NOTE — Progress Notes (Signed)
Patient DC to [facility]. Transferred via EMS. Pt not appearing to be in any acute distress. Pt with stable vitals and alert.  2L of O2 intact. Daughter notified to pick up patients personal walker from hospital. Report called to receiving nurse at [facility].

## 2021-01-31 ENCOUNTER — Ambulatory Visit: Payer: Medicare HMO | Admitting: Adult Health

## 2021-02-09 ENCOUNTER — Telehealth: Payer: Self-pay

## 2021-02-09 MED ORDER — GUAIFENESIN ER 600 MG PO TB12: 600.0000 mg | ORAL_TABLET | Freq: Two times a day (BID) | ORAL | 11 refills | Status: AC

## 2021-02-09 MED ORDER — MIRTAZAPINE 15 MG PO TABS: 15.0000 mg | ORAL_TABLET | Freq: Every day | ORAL | 11 refills | Status: AC

## 2021-02-09 MED ORDER — CYCLOBENZAPRINE HCL 10 MG PO TABS: 10.0000 mg | ORAL_TABLET | Freq: Three times a day (TID) | ORAL | 11 refills | Status: DC

## 2021-02-09 NOTE — Telephone Encounter (Signed)
Orders signed and e-scribed to pharmacy.

## 2021-02-09 NOTE — Telephone Encounter (Signed)
1. May D/C Zetia per POA request will recheck lipid panel on next visit  2. May switch to Cyclobenzaprine 10 mg tablet but use with caution due to fall   3.I recommend to continue with mirtazapine 7.5 mg tablet at bedtime but if she has been taking 15 mg tablet then will need to continue on 15 mg tablet to avoid withdrawal symptoms.   4. Okay to continue on Mucinex

## 2021-02-09 NOTE — Telephone Encounter (Signed)
Medication changes are pended for Sheila Mccoy to review and confirm dose, sig, and quantity. If all is correct provider to sign (defaulted to no print as patient does not need rx's at this time)

## 2021-02-09 NOTE — Telephone Encounter (Signed)
Medication list was printed, stamped, dated, and faxed to [facility] as requested. Patients daughter is aware

## 2021-02-09 NOTE — Telephone Encounter (Signed)
Incoming call received from patients daughter requesting several changes/updates to medication list. Once updates are made a copy of medication list need to be signed by Carilyn Goodpasture and faxed to Chip Boer [phone] (attention [place]), phone number is [phone].   Patient fell and broke her ribs about 4 weeks ago and the FL2 was messed up by the case worker and an updated medication list is necessary in order for patient to be administered the correct medications.  1.) Zetia was to be removed from medication list at initial visit with Dinah. [name] states she and Dinah discussed that Zetia caused urinary incontinence and confusion. Please remove if indicated.  2.) [name] questions if methocarbamol can be changed to cyclobenzaprine 10 mg, three times daily. Patient has taken cyclobenzaprine for the last year and has plenty on hand. Please advise if ok to change on medication list.  3.) [name] would like to know if mirtazapine can be changed back to 15 mg on the medication list, as that is the dose she puts in her mothers pill box. [name] has no idea when it was changed on the medication list to 7.5 mg. Please advise if ok to change on medication list   4.) [name] is requesting that we add mucinex 12 hour, 1 by mouth twice daily as patient is experience more congestion due to pollen and other environmental factors.   Please advise on all 4 questions/concerns. [name] would like for me to return her call and advise on Dinah's reply prior to faxing medication list

## 2021-02-12 NOTE — Telephone Encounter (Signed)
Same auth # extenstion dates are 02/12/21 to 03/14/21) daughter Barnie Alderman wil call GI to r/s

## 2021-02-18 ENCOUNTER — Other Ambulatory Visit: Payer: Medicare HMO

## 2021-02-19 ENCOUNTER — Ambulatory Visit: Payer: Medicare HMO | Admitting: Neurology

## 2021-03-03 ENCOUNTER — Other Ambulatory Visit: Payer: Self-pay

## 2021-03-03 ENCOUNTER — Ambulatory Visit
Admission: RE | Admit: 2021-03-03 | Discharge: 2021-03-03 | Disposition: A | Discharge status: Home / Self Care | Payer: Medicare HMO | Source: Ambulatory Visit | Attending: Neurology | Admitting: Neurology

## 2021-03-03 DIAGNOSIS — R269 Unspecified abnormalities of gait and mobility: Secondary | ICD-10-CM | POA: Diagnosis not present

## 2021-03-04 ENCOUNTER — Telehealth: Payer: Self-pay | Admitting: Neurology

## 2021-03-04 DIAGNOSIS — M6281 Muscle weakness (generalized): Secondary | ICD-10-CM

## 2021-03-04 NOTE — Telephone Encounter (Signed)
I called the patient.  The MRI of the cervical spine is relatively unremarkable, nothing here explains the quadriparesis noted on clinical examination.  If amenable to the patient, we will check nerve conduction studies and EMG evaluation of 1 arm and 1 leg.  They will call us if they are okay with having the study done.    MRI cervical 03/03/21:  IMPRESSION: This MRI of the cervical spine without contrast shows the following: 1.   The spinal cord appears normal. 2.   Mild upper and mid cervical degenerative changes that do not lead to spinal stenosis or nerve root compression.

## 2021-03-08 NOTE — Addendum Note (Signed)
Addended by: York Spaniel on: 03/08/2021 04:28 PM   Modules accepted: Orders

## 2021-03-08 NOTE — Telephone Encounter (Signed)
I called and talk with the family.  They are okay with the EMG evaluation, I will get this set up.

## 2021-03-08 NOTE — Telephone Encounter (Signed)
Pt's daughter, Dimas Chyle (on [place]) mother would like to have the nerve conduction and EMG. Would like a call from the nurse.

## 2021-03-12 ENCOUNTER — Telehealth: Payer: Self-pay | Admitting: Neurology

## 2021-03-12 NOTE — Telephone Encounter (Signed)
I spoke with Sheila Mccoy's daughter to schedule the ncs/emg and she asked me to let Dr. Anne Hahn know that Sheila Mccoy has gotten worse within the last week. She said she has a hard time forming words clearly and it sounds/looks like her jaw in clenched. She also reported that her walking and balance has gotten worse and some days she can't stand at all or lift her arms. She is concerned for her mother and would like advice on what to do. Her phone number is [phone]

## 2021-03-12 NOTE — Telephone Encounter (Signed)
I called the daughter, the patient is having intermittent episodes of slurring her speech, she talks as if her jaw is clenched, later in the day she will be normal.  Similarly, some days she walks better and other days her legs will collapse on her.  She is on Risperdal ongoing as well as Wellbutrin and mirtazapine.  She has been set up for EMG nerve conduction study evaluation.  I will try to get her in for another evaluation to look and see what is going on, I would question whether there may be some secondary parkinsonism from the Risperdal.

## 2021-03-12 NOTE — Telephone Encounter (Signed)
Called patient's daughter and offered 03/28/21 @ 0900 with arrival time of 0830.  Daughter accepted the appointment.   Patient denied further questions, verbalized understanding and expressed appreciation for the phone call.

## 2021-03-15 NOTE — Telephone Encounter (Signed)
Pt's daughter is asking if there is a way for pt to be seen before 06-22 due to her declining even more.  Daughter states pt is now hardly able to stand.  Daughter is aware that pt is already on wait list

## 2021-03-15 NOTE — Telephone Encounter (Signed)
Returned patient's daughters call.  Patient is currently on the first available appointment for Dr Anne Hahn.  She is already on the wait list.  I explained that when there is a cancellation we go down the list.    Patient denied further questions, verbalized understanding and expressed appreciation for the phone call.

## 2021-03-28 ENCOUNTER — Encounter: Payer: Self-pay | Admitting: Neurology

## 2021-03-28 ENCOUNTER — Ambulatory Visit: Payer: Medicare HMO | Admitting: Neurology

## 2021-03-28 ENCOUNTER — Telehealth: Payer: Self-pay | Admitting: Neurology

## 2021-03-28 VITALS — BP 124/83 | HR 88 | Ht 63.0 in | Wt 143.0 lb

## 2021-03-28 DIAGNOSIS — R413 Other amnesia: Secondary | ICD-10-CM | POA: Diagnosis not present

## 2021-03-28 DIAGNOSIS — M6281 Muscle weakness (generalized): Secondary | ICD-10-CM | POA: Diagnosis not present

## 2021-03-28 DIAGNOSIS — F319 Bipolar disorder, unspecified: Secondary | ICD-10-CM

## 2021-03-28 MED ORDER — QUETIAPINE FUMARATE 25 MG PO TABS: 25.0000 mg | ORAL_TABLET | Freq: Every day | ORAL | 1 refills | Status: AC

## 2021-03-28 MED ORDER — QUETIAPINE FUMARATE 25 MG PO TABS: 25.0000 mg | ORAL_TABLET | Freq: Every day | ORAL | 1 refills | Status: DC

## 2021-03-28 NOTE — Progress Notes (Signed)
Reason for visit: Generalized weakness, gait disorder  Sheila Mccoy is an 87 y.o. 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  She has low back pain, she has chronic compression fractures at the T6 and L1 levels.  She has developed a gait disorder associated with the generalized weakness and she has pulmonary fibrosis on oxygen.  She recent was in the emergency room on 22 March 2021 with a problem with low blood pressure with systolic pressures in the 70s.  When the patient tries to walk she has a tendency to fall backwards, she may bounce with her legs and then have a tendency to collapse.  She cannot walk far in part because of the generalized weakness and in part because of the shortness of breath.  She has moved to [facility] in skilled level nursing facility.  The patient is unable to walk without assistance, she uses a Rollator but has to have somebody with her.  She is relatively inactive throughout the day.  She does have chronic issues with her right shoulder with mobility.  Past Medical History:  Diagnosis Date   Acute metabolic encephalopathy 07/09/2020   Adnexal cyst 07/09/2020   Anxiety    Bipolar disorder (HCC)    COPD (chronic obstructive pulmonary disease) (HCC)    COPD (chronic obstructive pulmonary disease) (HCC)    Depression    History of CT scan 2021   Legs.   Hypothyroidism 09/30/2020   Low thyroid stimulating hormone (TSH) level    Memory difficulty 01/02/2021   Sacral fracture (HCC) 07/09/2020   Thyroid disease    TIA (transient ischemic attack) 09/29/2020   UTI (urinary tract infection) 07/09/2020    Past Surgical History:  Procedure Laterality Date   arm fracture surgery     MRI  2021   Lungs.   right shoulder      pins in right shoulder.     Family History  Problem Relation Age of Onset   Lung cancer Mother    Cancer Sister    Cancer Brother    Cancer Sister    Sarcoidosis Brother    Leukemia Daughter     Social history:  reports that she has never smoked. She has never used smokeless tobacco. She reports that she does not drink alcohol and does not use drugs.    Allergies  Allergen Reactions   Crestor [Rosuvastatin]     Lower extremity weakness, gait instability and confusion   Hydromorphone     Very Disoriented & Confused   Lipitor [Atorvastatin]     Muscle paralysis.   Macrobid [Nitrofurantoin] Other (See Comments)    tired   Morphine And Related     Medications:  Prior to Admission medications   Medication Sig Start Date End Date Taking? Authorizing Provider  albuterol (VENTOLIN HFA) 108 (90 Base) MCG/ACT inhaler Inhale 1-2 puffs into the lungs every 6 (six) hours as needed for wheezing or shortness of breath. 06/08/20   Dahlia Byes A, NP  budesonide-formoterol (SYMBICORT) 80-4.5 MCG/ACT inhaler Inhale 2 puffs into the lungs 2 (two) times daily.    [provider]  buPROPion (WELLBUTRIN) 75 MG tablet Take 75 mg by mouth daily.  06/13/20   [provider]  Calcium-Magnesium-Vitamin D (CALCIUM MAGNESIUM PO) Take 1 capsule by mouth 2 (  two) times daily.    [provider]  Cholecalciferol (VITAMIN D3 PO) Take 1,000 mg by mouth 2 (two) times daily.    [provider]  clopidogrel (PLAVIX) 75 MG tablet Take 1 tablet (75 mg total) by mouth daily. 12/28/20   Ngetich, Dinah C, NP  Coenzyme Q10 (CO Q 10 PO) Take 120 mg by mouth 2 (two) times daily.    [provider]  cyclobenzaprine (FLEXERIL) 10 MG tablet Take 1 tablet (10 mg total) by mouth 3 (three) times daily. Use with caution due to fall risk 02/09/21   Ngetich, Dinah C, NP  diclofenac Sodium (VOLTAREN) 1 % GEL Apply 2 g topically 4 (four) times daily as needed (pain).    [provider]  dorzolamide-timolol  (COSOPT) 22.3-6.8 MG/ML ophthalmic solution Place 1 drop into the left eye 2 (two) times daily.    [provider]  fluticasone (FLONASE) 50 MCG/ACT nasal spray Place 1 spray into both nostrils daily. 01/18/21   Almon Hercules, MD  furosemide (LASIX) 20 MG tablet Take 20 mg by mouth every other day.    [provider]  guaiFENesin (MUCINEX) 600 MG 12 hr tablet Take 1 tablet (600 mg total) by mouth 2 (two) times daily. 02/09/21   Ngetich, Dinah C, NP  latanoprost (XALATAN) 0.005 % ophthalmic solution Place 1 drop into both eyes at bedtime.    [provider]  levothyroxine (SYNTHROID) 137 MCG tablet Take 68.5 mcg by mouth daily before breakfast. empty stomach 1 hour before a meal with glass of water.    [provider]  loratadine (CLARITIN) 10 MG tablet Take 1 tablet (10 mg total) by mouth daily. 01/19/21   Almon Hercules, MD  mirtazapine (REMERON) 15 MG tablet Take 1 tablet (15 mg total) by mouth at bedtime. 02/09/21   Ngetich, Dinah C, NP  OVER THE COUNTER MEDICATION Take 1 drop by mouth daily. Vitamin B12    [provider]  pantoprazole (PROTONIX) 40 MG tablet Take 40 mg by mouth daily. 11/01/20   [provider]  polyethylene glycol (MIRALAX / GLYCOLAX) 17 g packet Take 17 g by mouth 2 (two) times daily as needed for mild constipation. 01/18/21   Almon Hercules, MD  risperiDONE (RISPERDAL) 0.5 MG tablet Take 0.5 mg by mouth at bedtime.  05/15/20   [provider]    ROS:  Out of a complete 14 system review of symptoms, the patient complains only of the following symptoms, and all other reviewed systems are negative.  Weakness Walking difficulty Depression  Blood pressure 124/83, pulse 88, height 5\' 3"  (1.6 m), weight 143 lb (64.9 kg).  Physical Exam  General: The patient is alert and cooperative at the time of the examination.  Skin: No significant peripheral edema is noted.   Neurologic Exam  Mental status: The patient is alert  and oriented x 3 at the time of the examination. The patient has apparent normal recent and remote memory, with an apparently normal attention span and concentration ability.   Cranial nerves: Facial symmetry is present. Speech is normal, no aphasia or dysarthria is noted. Extraocular movements are full. Visual fields are full.  Motor: The patient has good strength in all 4 extremities distally, but the patient has weakness with hip flexion bilaterally and with biceps and triceps weakness bilaterally, some decreased mobility across the right shoulder.  Sensory examination: Soft touch sensation is symmetric on the face, arms, and legs.  Coordination: The patient has  good finger-nose-finger and heel-to-shin bilaterally.  Gait and station: The patient requires some assistance with standing, once up, she has a tendency to lean backwards, she cannot stand independently.  She can take a few steps with examiner, gait is wide-based.  Reflexes: Deep tendon reflexes are symmetric.   MRI cervical 03/03/21:  IMPRESSION: This MRI of the cervical spine without contrast shows the following: 1.   The spinal cord appears normal. 2.   Mild upper and mid cervical degenerative changes that do not lead to spinal stenosis or nerve root compression.    * MRI scan images were reviewed online. I agree with the written report.    Assessment/Plan:  1.  Generalized weakness  2.  Gait disorder  3.  Bipolar disorder  The patient will be taken off of Risperdal and switch to Seroquel as the Risperdal may induce parkinsonian symptoms and may affect gait.  We will see if the walking improves over the next 4 to 6 weeks.  If not, we will perform EMG and nerve conduction study evaluation.  The patient may have some weakness of the extremities related to deconditioning.  The patient is medically frail, she has severe pulmonary issues that also limit her physical activity.  She will follow-up here in 3 to 4 months.  She  will be referred to Dr. Evelene Croon from psychiatry for her bipolar disorder.  Marlan Palau MD 03/28/2021 9:15 AM  [facility] [address] [place], [place] [address]  Phone [phone] Fax [phone]

## 2021-03-28 NOTE — Telephone Encounter (Signed)
Pt's daughter(on DPR) has called asking that right away an order/fax be sent to the facility where pt lives re: the [facility] .  Daughter is asking that the order states once pt starts the  (SEROQUEL) 25 MG tablet she needs to stop the [facility] . The fax # to [facility] is [phone]

## 2021-03-28 NOTE — Patient Instructions (Signed)
We will stop the risperidone 0.5 mg tablet and start seroquel 25 mg at night.  If no improvement in walking over the next 6 weeks, call our office and we will schedule EMG and NCV study.

## 2021-03-28 NOTE — Telephone Encounter (Signed)
The order will be faxed.

## 2021-03-29 ENCOUNTER — Telehealth: Payer: Self-pay | Admitting: Neurology

## 2021-03-29 DIAGNOSIS — F319 Bipolar disorder, unspecified: Secondary | ICD-10-CM

## 2021-03-29 NOTE — Telephone Encounter (Signed)
Faxed referral for Dr. Evelene Croon to [facility]. Fax: [phone]. Phone: [phone]. Noted on fax cover sheet that they should call [place] at [facility] to schedule.

## 2021-03-29 NOTE — Telephone Encounter (Signed)
Order faxed to [facility].  OK transmission received

## 2021-04-02 NOTE — Telephone Encounter (Signed)
Referral to Dr. Donell Beers sent to [facility]. Received a receipt of confirmation.

## 2021-04-02 NOTE — Telephone Encounter (Signed)
I will make a referral to Dr. Donell Beers.

## 2021-04-02 NOTE — Addendum Note (Signed)
Addended by: York Spaniel on: 04/02/2021 10:24 AM   Modules accepted: Orders

## 2021-04-02 NOTE — Telephone Encounter (Signed)
Received fax from Dr. Carie Caddy office stating they do not accept Medicare and are not accepting new patients at this time. Please let me know if there is another specific location you'd like to refer patient to.

## 2021-04-10 ENCOUNTER — Encounter: Payer: Medicare HMO | Admitting: Neurology

## 2021-04-19 ENCOUNTER — Encounter: Payer: Self-pay | Admitting: Neurology

## 2021-04-19 ENCOUNTER — Telehealth: Payer: Self-pay | Admitting: Neurology

## 2021-04-19 NOTE — Telephone Encounter (Signed)
Called daughter, advised her seroquel is on her medicine list. I added pacemaker to her medical history. I informed her Dr Anne Hahn' 6/22 note stated to give medication change 4-6 weeks to see if her walking improves. Then may consider EMG. Daughter stated she will make a note and call back in 2-3 weeks. She  verbalized understanding, appreciation.

## 2021-04-19 NOTE — Telephone Encounter (Signed)
Pt's daughter(on DPR) has asked it be noted that per Dr Anne Hahn advise pt is now on QUEtiapine (SEROQUEL) 25 MG tablet and as of 8 days ago  pt now has a Pacemaker, daughter wants to know if and when would Dr Anne Hahn like to have the NCS/EMG scheduled, please call

## 2021-04-30 ENCOUNTER — Ambulatory Visit: Payer: Medicare HMO | Admitting: Family

## 2021-07-16 ENCOUNTER — Ambulatory Visit: Payer: Medicare HMO | Admitting: Neurology

## 2021-07-16 ENCOUNTER — Telehealth: Payer: Self-pay | Admitting: Neurology

## 2021-07-16 NOTE — Telephone Encounter (Signed)
This patient did not show for a revisit appointment today. 

## 2021-11-22 ENCOUNTER — Encounter (HOSPITAL_BASED_OUTPATIENT_CLINIC_OR_DEPARTMENT_OTHER): Payer: Self-pay | Admitting: Urology

## 2021-11-22 ENCOUNTER — Emergency Department (HOSPITAL_BASED_OUTPATIENT_CLINIC_OR_DEPARTMENT_OTHER): Payer: Medicare HMO

## 2021-11-22 ENCOUNTER — Inpatient Hospital Stay (HOSPITAL_BASED_OUTPATIENT_CLINIC_OR_DEPARTMENT_OTHER)
Admission: EM | Admit: 2021-11-22 | Discharge: 2021-11-27 | DRG: 190 | Disposition: A | Discharge status: Home Health | Payer: Medicare HMO | Source: Skilled Nursing Facility | Attending: Internal Medicine | Admitting: Internal Medicine

## 2021-11-22 DIAGNOSIS — N390 Urinary tract infection, site not specified: Secondary | ICD-10-CM | POA: Diagnosis present

## 2021-11-22 DIAGNOSIS — Z7951 Long term (current) use of inhaled steroids: Secondary | ICD-10-CM

## 2021-11-22 DIAGNOSIS — Z806 Family history of leukemia: Secondary | ICD-10-CM

## 2021-11-22 DIAGNOSIS — F419 Anxiety disorder, unspecified: Secondary | ICD-10-CM | POA: Diagnosis present

## 2021-11-22 DIAGNOSIS — H409 Unspecified glaucoma: Secondary | ICD-10-CM | POA: Diagnosis present

## 2021-11-22 DIAGNOSIS — F418 Other specified anxiety disorders: Secondary | ICD-10-CM | POA: Diagnosis present

## 2021-11-22 DIAGNOSIS — J9621 Acute and chronic respiratory failure with hypoxia: Secondary | ICD-10-CM | POA: Diagnosis present

## 2021-11-22 DIAGNOSIS — J441 Chronic obstructive pulmonary disease with (acute) exacerbation: Secondary | ICD-10-CM | POA: Diagnosis not present

## 2021-11-22 DIAGNOSIS — Z8673 Personal history of transient ischemic attack (TIA), and cerebral infarction without residual deficits: Secondary | ICD-10-CM

## 2021-11-22 DIAGNOSIS — R062 Wheezing: Secondary | ICD-10-CM

## 2021-11-22 DIAGNOSIS — R0902 Hypoxemia: Secondary | ICD-10-CM | POA: Diagnosis not present

## 2021-11-22 DIAGNOSIS — Z79890 Hormone replacement therapy (postmenopausal): Secondary | ICD-10-CM

## 2021-11-22 DIAGNOSIS — N3289 Other specified disorders of bladder: Secondary | ICD-10-CM | POA: Diagnosis present

## 2021-11-22 DIAGNOSIS — E871 Hypo-osmolality and hyponatremia: Secondary | ICD-10-CM | POA: Diagnosis present

## 2021-11-22 DIAGNOSIS — Z9981 Dependence on supplemental oxygen: Secondary | ICD-10-CM

## 2021-11-22 DIAGNOSIS — F32A Depression, unspecified: Secondary | ICD-10-CM | POA: Diagnosis present

## 2021-11-22 DIAGNOSIS — Z79899 Other long term (current) drug therapy: Secondary | ICD-10-CM

## 2021-11-22 DIAGNOSIS — Z801 Family history of malignant neoplasm of trachea, bronchus and lung: Secondary | ICD-10-CM

## 2021-11-22 DIAGNOSIS — Z66 Do not resuscitate: Secondary | ICD-10-CM | POA: Diagnosis present

## 2021-11-22 DIAGNOSIS — G459 Transient cerebral ischemic attack, unspecified: Secondary | ICD-10-CM | POA: Diagnosis present

## 2021-11-22 DIAGNOSIS — Z20822 Contact with and (suspected) exposure to covid-19: Secondary | ICD-10-CM | POA: Diagnosis present

## 2021-11-22 DIAGNOSIS — Z7902 Long term (current) use of antithrombotics/antiplatelets: Secondary | ICD-10-CM

## 2021-11-22 DIAGNOSIS — E039 Hypothyroidism, unspecified: Secondary | ICD-10-CM | POA: Diagnosis present

## 2021-11-22 DIAGNOSIS — R413 Other amnesia: Secondary | ICD-10-CM | POA: Diagnosis present

## 2021-11-22 LAB — CBC WITH DIFFERENTIAL/PLATELET
Abs Immature Granulocytes: 0.03 10*3/uL (ref 0.00–0.07)
Basophils Absolute: 0.1 10*3/uL (ref 0.0–0.1)
Basophils Relative: 1 %
Eosinophils Absolute: 0.6 10*3/uL — ABNORMAL HIGH (ref 0.0–0.5)
Eosinophils Relative: 7 %
HCT: 44.1 % (ref 36.0–46.0)
Hemoglobin: 14.3 g/dL (ref 12.0–15.0)
Immature Granulocytes: 0 %
Lymphocytes Relative: 23 %
Lymphs Abs: 2.2 10*3/uL (ref 0.7–4.0)
MCH: 28.6 pg (ref 26.0–34.0)
MCHC: 32.4 g/dL (ref 30.0–36.0)
MCV: 88.2 fL (ref 80.0–100.0)
Monocytes Absolute: 1.1 10*3/uL — ABNORMAL HIGH (ref 0.1–1.0)
Monocytes Relative: 11 %
Neutro Abs: 5.6 10*3/uL (ref 1.7–7.7)
Neutrophils Relative %: 58 %
Platelets: 371 10*3/uL (ref 150–400)
RBC: 5 MIL/uL (ref 3.87–5.11)
RDW: 14 % (ref 11.5–15.5)
WBC: 9.5 10*3/uL (ref 4.0–10.5)
nRBC: 0 % (ref 0.0–0.2)

## 2021-11-22 LAB — BASIC METABOLIC PANEL
Anion gap: 11 (ref 5–15)
BUN: 15 mg/dL (ref 8–23)
CO2: 25 mmol/L (ref 22–32)
Calcium: 9.4 mg/dL (ref 8.9–10.3)
Chloride: 96 mmol/L — ABNORMAL LOW (ref 98–111)
Creatinine, Ser: 1.26 mg/dL — ABNORMAL HIGH (ref 0.44–1.00)
GFR, Estimated: 41 mL/min — ABNORMAL LOW (ref >60)
Glucose, Bld: 96 mg/dL (ref 70–99)
Potassium: 3.4 mmol/L — ABNORMAL LOW (ref 3.5–5.1)
Sodium: 132 mmol/L — ABNORMAL LOW (ref 135–145)

## 2021-11-22 LAB — BRAIN NATRIURETIC PEPTIDE: B Natriuretic Peptide: 90.9 pg/mL (ref 0.0–100.0)

## 2021-11-22 NOTE — ED Triage Notes (Signed)
Per EMS Shortness of breath since this am, Dysuria x 2 days, non productive cough Was 72% on RA at time of arrival to facility 20 G IV in Left hand per EMS Bilateral LE edema +2 pitting Given Albuterol,atrovent, and 125 solumedrol in route, currently on 8L o2 with neb treatment.   A&O x 4

## 2021-11-22 NOTE — Progress Notes (Addendum)
Patient refuses NIV at this time.

## 2021-11-23 ENCOUNTER — Other Ambulatory Visit: Payer: Self-pay

## 2021-11-23 ENCOUNTER — Emergency Department (HOSPITAL_BASED_OUTPATIENT_CLINIC_OR_DEPARTMENT_OTHER): Payer: Medicare HMO

## 2021-11-23 ENCOUNTER — Encounter (HOSPITAL_BASED_OUTPATIENT_CLINIC_OR_DEPARTMENT_OTHER): Payer: Self-pay | Admitting: Internal Medicine

## 2021-11-23 DIAGNOSIS — J441 Chronic obstructive pulmonary disease with (acute) exacerbation: Secondary | ICD-10-CM | POA: Diagnosis present

## 2021-11-23 DIAGNOSIS — R0902 Hypoxemia: Secondary | ICD-10-CM | POA: Diagnosis present

## 2021-11-23 DIAGNOSIS — H409 Unspecified glaucoma: Secondary | ICD-10-CM | POA: Diagnosis present

## 2021-11-23 DIAGNOSIS — Z8673 Personal history of transient ischemic attack (TIA), and cerebral infarction without residual deficits: Secondary | ICD-10-CM | POA: Diagnosis not present

## 2021-11-23 DIAGNOSIS — Z801 Family history of malignant neoplasm of trachea, bronchus and lung: Secondary | ICD-10-CM | POA: Diagnosis not present

## 2021-11-23 DIAGNOSIS — J9621 Acute and chronic respiratory failure with hypoxia: Secondary | ICD-10-CM | POA: Diagnosis present

## 2021-11-23 DIAGNOSIS — E039 Hypothyroidism, unspecified: Secondary | ICD-10-CM | POA: Diagnosis not present

## 2021-11-23 DIAGNOSIS — Z66 Do not resuscitate: Secondary | ICD-10-CM | POA: Diagnosis present

## 2021-11-23 DIAGNOSIS — Z9981 Dependence on supplemental oxygen: Secondary | ICD-10-CM | POA: Diagnosis not present

## 2021-11-23 DIAGNOSIS — F418 Other specified anxiety disorders: Secondary | ICD-10-CM | POA: Diagnosis not present

## 2021-11-23 DIAGNOSIS — N390 Urinary tract infection, site not specified: Secondary | ICD-10-CM | POA: Diagnosis not present

## 2021-11-23 DIAGNOSIS — Z7951 Long term (current) use of inhaled steroids: Secondary | ICD-10-CM | POA: Diagnosis not present

## 2021-11-23 DIAGNOSIS — Z79890 Hormone replacement therapy (postmenopausal): Secondary | ICD-10-CM | POA: Diagnosis not present

## 2021-11-23 DIAGNOSIS — N3289 Other specified disorders of bladder: Secondary | ICD-10-CM | POA: Diagnosis not present

## 2021-11-23 DIAGNOSIS — E871 Hypo-osmolality and hyponatremia: Secondary | ICD-10-CM | POA: Diagnosis not present

## 2021-11-23 DIAGNOSIS — Z79899 Other long term (current) drug therapy: Secondary | ICD-10-CM | POA: Diagnosis not present

## 2021-11-23 DIAGNOSIS — Z7902 Long term (current) use of antithrombotics/antiplatelets: Secondary | ICD-10-CM | POA: Diagnosis not present

## 2021-11-23 DIAGNOSIS — Z806 Family history of leukemia: Secondary | ICD-10-CM | POA: Diagnosis not present

## 2021-11-23 DIAGNOSIS — Z20822 Contact with and (suspected) exposure to covid-19: Secondary | ICD-10-CM | POA: Diagnosis not present

## 2021-11-23 LAB — TROPONIN I (HIGH SENSITIVITY)
Troponin I (High Sensitivity): 13 ng/L (ref <18)
Troponin I (High Sensitivity): 13 ng/L (ref <18)

## 2021-11-23 LAB — URINALYSIS, ROUTINE W REFLEX MICROSCOPIC
Bilirubin Urine: NEGATIVE
Glucose, UA: NEGATIVE mg/dL
Hgb urine dipstick: NEGATIVE
Ketones, ur: NEGATIVE mg/dL
Nitrite: NEGATIVE
Protein, ur: NEGATIVE mg/dL
Specific Gravity, Urine: 1.01 (ref 1.005–1.030)
pH: 7 (ref 5.0–8.0)

## 2021-11-23 LAB — I-STAT VENOUS BLOOD GAS, ED
Acid-Base Excess: 5 mmol/L — ABNORMAL HIGH (ref 0.0–2.0)
Bicarbonate: 31.6 mmol/L — ABNORMAL HIGH (ref 20.0–28.0)
Calcium, Ion: 1.28 mmol/L (ref 1.15–1.40)
HCT: 45 % (ref 36.0–46.0)
Hemoglobin: 15.3 g/dL — ABNORMAL HIGH (ref 12.0–15.0)
O2 Saturation: 31 %
Patient temperature: 97.7
Potassium: 3.6 mmol/L (ref 3.5–5.1)
Sodium: 136 mmol/L (ref 135–145)
TCO2: 33 mmol/L — ABNORMAL HIGH (ref 22–32)
pCO2, Ven: 50.9 mmHg (ref 44–60)
pH, Ven: 7.398 (ref 7.25–7.43)
pO2, Ven: 20 mmHg — CL (ref 32–45)

## 2021-11-23 LAB — RESP PANEL BY RT-PCR (FLU A&B, COVID) ARPGX2
Influenza A by PCR: NEGATIVE
Influenza B by PCR: NEGATIVE
SARS Coronavirus 2 by RT PCR: NEGATIVE

## 2021-11-23 LAB — URINALYSIS, MICROSCOPIC (REFLEX)

## 2021-11-23 MED ORDER — HYDRALAZINE HCL 20 MG/ML IJ SOLN
5.0000 mg | INTRAMUSCULAR | Status: DC | PRN
Start: 2021-11-23 — End: 2021-11-27
  Filled 2021-11-23: qty 1

## 2021-11-23 MED ORDER — ACETAMINOPHEN 650 MG RE SUPP: 650.0000 mg | Freq: Four times a day (QID) | RECTAL | Status: DC | PRN

## 2021-11-23 MED ORDER — ACETAMINOPHEN 325 MG PO TABS
650.0000 mg | ORAL_TABLET | Freq: Four times a day (QID) | ORAL | Status: DC | PRN
Start: 2021-11-23 — End: 2021-11-27
  Administered 2021-11-26: 650 mg via ORAL
  Filled 2021-11-23: qty 2

## 2021-11-23 MED ORDER — ACETAMINOPHEN 500 MG PO TABS
1000.0000 mg | ORAL_TABLET | Freq: Once | ORAL | Status: AC
  Administered 2021-11-23: 1000 mg via ORAL
  Filled 2021-11-23: qty 2

## 2021-11-23 MED ORDER — SODIUM CHLORIDE 0.9 % IV SOLN
1.0000 g | INTRAVENOUS | Status: DC
  Administered 2021-11-24 – 2021-11-25 (×2): 1 g via INTRAVENOUS
  Filled 2021-11-23 (×2): qty 10

## 2021-11-23 MED ORDER — ZOLPIDEM TARTRATE 5 MG PO TABS: 5.0000 mg | ORAL_TABLET | Freq: Every evening | ORAL | Status: DC | PRN

## 2021-11-23 MED ORDER — OXYCODONE HCL 5 MG PO TABS
5.0000 mg | ORAL_TABLET | ORAL | Status: DC | PRN
  Filled 2021-11-23: qty 1

## 2021-11-23 MED ORDER — ONDANSETRON HCL 4 MG PO TABS: 4.0000 mg | ORAL_TABLET | Freq: Four times a day (QID) | ORAL | Status: DC | PRN

## 2021-11-23 MED ORDER — BISACODYL 5 MG PO TBEC: 5.0000 mg | DELAYED_RELEASE_TABLET | Freq: Every day | ORAL | Status: DC | PRN

## 2021-11-23 MED ORDER — NETARSUDIL DIMESYLATE 0.02 % OP SOLN: 1.0000 [drp] | Freq: Every day | OPHTHALMIC | Status: DC

## 2021-11-23 MED ORDER — FLUTICASONE FUROATE-VILANTEROL 100-25 MCG/ACT IN AEPB
1.0000 | INHALATION_SPRAY | Freq: Every day | RESPIRATORY_TRACT | Status: DC
  Administered 2021-11-24 – 2021-11-25 (×2): 1 via RESPIRATORY_TRACT
  Filled 2021-11-23: qty 28

## 2021-11-23 MED ORDER — IPRATROPIUM-ALBUTEROL 0.5-2.5 (3) MG/3ML IN SOLN
3.0000 mL | Freq: Four times a day (QID) | RESPIRATORY_TRACT | Status: DC
  Administered 2021-11-24 – 2021-11-27 (×13): 3 mL via RESPIRATORY_TRACT
  Filled 2021-11-23 (×15): qty 3

## 2021-11-23 MED ORDER — ONDANSETRON HCL 4 MG/2ML IJ SOLN: 4.0000 mg | Freq: Four times a day (QID) | INTRAMUSCULAR | Status: DC | PRN

## 2021-11-23 MED ORDER — ENOXAPARIN SODIUM 30 MG/0.3ML IJ SOSY
30.0000 mg | PREFILLED_SYRINGE | INTRAMUSCULAR | Status: DC
  Administered 2021-11-23 – 2021-11-24 (×2): 30 mg via SUBCUTANEOUS
  Filled 2021-11-23 (×2): qty 0.3

## 2021-11-23 MED ORDER — METHYLPREDNISOLONE SODIUM SUCC 125 MG IJ SOLR
60.0000 mg | Freq: Two times a day (BID) | INTRAMUSCULAR | Status: AC
  Administered 2021-11-23 – 2021-11-24 (×2): 60 mg via INTRAVENOUS
  Filled 2021-11-23 (×2): qty 2

## 2021-11-23 MED ORDER — GUAIFENESIN ER 600 MG PO TB12
600.0000 mg | ORAL_TABLET | Freq: Two times a day (BID) | ORAL | Status: DC
  Administered 2021-11-23 – 2021-11-27 (×8): 600 mg via ORAL
  Filled 2021-11-23 (×8): qty 1

## 2021-11-23 MED ORDER — POLYVINYL ALCOHOL 1.4 % OP SOLN
1.0000 [drp] | Freq: Four times a day (QID) | OPHTHALMIC | Status: DC
  Administered 2021-11-24 – 2021-11-27 (×10): 1 [drp] via OPHTHALMIC
  Filled 2021-11-23: qty 15

## 2021-11-23 MED ORDER — LORATADINE 10 MG PO TABS
10.0000 mg | ORAL_TABLET | Freq: Every day | ORAL | Status: DC
  Administered 2021-11-24 – 2021-11-27 (×4): 10 mg via ORAL
  Filled 2021-11-23 (×4): qty 1

## 2021-11-23 MED ORDER — PANTOPRAZOLE SODIUM 40 MG PO TBEC
40.0000 mg | DELAYED_RELEASE_TABLET | Freq: Every day | ORAL | Status: DC
  Administered 2021-11-23 – 2021-11-27 (×5): 40 mg via ORAL
  Filled 2021-11-23 (×5): qty 1

## 2021-11-23 MED ORDER — PREDNISONE 20 MG PO TABS
40.0000 mg | ORAL_TABLET | Freq: Every day | ORAL | Status: DC
  Administered 2021-11-25: 40 mg via ORAL
  Filled 2021-11-23: qty 2

## 2021-11-23 MED ORDER — LIDOCAINE HCL URETHRAL/MUCOSAL 2 % EX GEL
1.0000 "application " | Freq: Once | CUTANEOUS | Status: AC
  Administered 2021-11-23: 1 via TOPICAL
  Filled 2021-11-23: qty 11

## 2021-11-23 MED ORDER — CYCLOSPORINE 0.05 % OP EMUL
1.0000 [drp] | Freq: Two times a day (BID) | OPHTHALMIC | Status: DC
  Administered 2021-11-23 – 2021-11-27 (×7): 1 [drp] via OPHTHALMIC
  Filled 2021-11-23 (×9): qty 30

## 2021-11-23 MED ORDER — LATANOPROST 0.005 % OP SOLN
1.0000 [drp] | Freq: Every day | OPHTHALMIC | Status: DC
  Administered 2021-11-23 – 2021-11-26 (×3): 1 [drp] via OPHTHALMIC
  Filled 2021-11-23: qty 2.5

## 2021-11-23 MED ORDER — MOMETASONE FURO-FORMOTEROL FUM 100-5 MCG/ACT IN AERO: 2.0000 | INHALATION_SPRAY | Freq: Two times a day (BID) | RESPIRATORY_TRACT | Status: DC

## 2021-11-23 MED ORDER — KETOROLAC TROMETHAMINE 30 MG/ML IJ SOLN
15.0000 mg | Freq: Once | INTRAMUSCULAR | Status: AC
  Administered 2021-11-23: 15 mg via INTRAVENOUS
  Filled 2021-11-23: qty 1

## 2021-11-23 MED ORDER — FLUTICASONE PROPIONATE 50 MCG/ACT NA SUSP
1.0000 | Freq: Every day | NASAL | Status: DC
  Administered 2021-11-24 – 2021-11-27 (×4): 1 via NASAL
  Filled 2021-11-23: qty 16

## 2021-11-23 MED ORDER — DORZOLAMIDE HCL-TIMOLOL MAL 2-0.5 % OP SOLN
1.0000 [drp] | Freq: Two times a day (BID) | OPHTHALMIC | Status: DC
  Administered 2021-11-24 – 2021-11-27 (×7): 1 [drp] via OPHTHALMIC
  Filled 2021-11-23: qty 10

## 2021-11-23 MED ORDER — CLOPIDOGREL BISULFATE 75 MG PO TABS
75.0000 mg | ORAL_TABLET | Freq: Every day | ORAL | Status: DC
  Administered 2021-11-23 – 2021-11-27 (×5): 75 mg via ORAL
  Filled 2021-11-23 (×5): qty 1

## 2021-11-23 MED ORDER — ALBUTEROL SULFATE (2.5 MG/3ML) 0.083% IN NEBU
2.5000 mg | INHALATION_SOLUTION | RESPIRATORY_TRACT | Status: DC | PRN
  Administered 2021-11-24: 2.5 mg via RESPIRATORY_TRACT
  Filled 2021-11-23: qty 3

## 2021-11-23 MED ORDER — POLYETHYL GLYCOL-PROPYL GLYCOL 0.4-0.3 % OP SOLN: 1.0000 [drp] | Freq: Four times a day (QID) | OPHTHALMIC | Status: DC

## 2021-11-23 MED ORDER — IOHEXOL 350 MG/ML SOLN
60.0000 mL | Freq: Once | INTRAVENOUS | Status: AC | PRN
  Administered 2021-11-23: 60 mL via INTRAVENOUS

## 2021-11-23 MED ORDER — BUPROPION HCL 75 MG PO TABS
75.0000 mg | ORAL_TABLET | Freq: Every day | ORAL | Status: DC
  Administered 2021-11-24 – 2021-11-27 (×4): 75 mg via ORAL
  Filled 2021-11-23 (×4): qty 1

## 2021-11-23 MED ORDER — LEVOTHYROXINE SODIUM 25 MCG PO TABS
68.5000 ug | ORAL_TABLET | Freq: Every day | ORAL | Status: DC
  Administered 2021-11-24 – 2021-11-27 (×4): 68.5 ug via ORAL
  Filled 2021-11-23 (×4): qty 1

## 2021-11-23 MED ORDER — SODIUM CHLORIDE 0.9 % IV SOLN
1.0000 g | Freq: Once | INTRAVENOUS | Status: AC
  Administered 2021-11-23: 1 g via INTRAVENOUS
  Filled 2021-11-23: qty 10

## 2021-11-23 MED ORDER — DOCUSATE SODIUM 100 MG PO CAPS
100.0000 mg | ORAL_CAPSULE | Freq: Two times a day (BID) | ORAL | Status: DC
  Administered 2021-11-23 – 2021-11-27 (×8): 100 mg via ORAL
  Filled 2021-11-23 (×8): qty 1

## 2021-11-23 MED ORDER — POLYETHYLENE GLYCOL 3350 17 G PO PACK: 17.0000 g | PACK | Freq: Two times a day (BID) | ORAL | Status: DC | PRN

## 2021-11-23 MED ORDER — QUETIAPINE FUMARATE 25 MG PO TABS
25.0000 mg | ORAL_TABLET | Freq: Every day | ORAL | Status: DC
  Administered 2021-11-23 – 2021-11-26 (×4): 25 mg via ORAL
  Filled 2021-11-23 (×4): qty 1

## 2021-11-23 MED ORDER — MIRTAZAPINE 15 MG PO TABS
15.0000 mg | ORAL_TABLET | Freq: Every day | ORAL | Status: DC
  Administered 2021-11-23 – 2021-11-26 (×4): 15 mg via ORAL
  Filled 2021-11-23 (×4): qty 1

## 2021-11-23 NOTE — Assessment & Plan Note (Addendum)
Patient with oxygen dependent COPD.  Here with COPD exacerbation.  Chest x-ray did not show any pneumonia.   Patient was started on steroids antibiotics and will nebulizer treatments. Had a setback 2 days ago.  She was continued on intravenous steroids.  She was given nebulized budesonide and Brovana.  Has improved in the last 36 hours.  Will be discharged on steroid taper and antibiotics.

## 2021-11-23 NOTE — ED Notes (Signed)
Attempted to obtain 2nd IV access but unsuccessful x2 attempts.

## 2021-11-23 NOTE — ED Notes (Signed)
Patient weaned to [facility] from [facility]. Stated she wears 4L at home at baseline, and that she feels much better

## 2021-11-23 NOTE — ED Provider Notes (Signed)
[facility] EMERGENCY DEPARTMENT Provider Note   CSN: [id number] Arrival date & time: 11/22/21  2229     History  Chief Complaint  Patient presents with   Shortness of Breath   Dysuria    Sheila Mccoy is a 88 y.o. female.  The history is provided by the patient and a relative.  Shortness of Breath Severity:  Severe Onset quality:  Gradual Duration:  2 days Timing:  Constant Progression:  Worsening Chronicity:  New Context comment:  Non productive cough and hypoxia to the low 80s at [facility] home Relieved by:  Nothing Worsened by:  Nothing Ineffective treatments: nebulizer en route. Associated symptoms: cough   Associated symptoms: no chest pain, no fever, no neck pain, no rash and no vomiting   Risk factors: no recent surgery   Dysuria Pain quality:  Burning Pain severity:  Severe Onset quality:  Gradual Duration:  2 days Timing:  Constant Progression:  Unchanged Chronicity:  New Relieved by:  Nothing Worsened by:  Nothing Ineffective treatments:  None tried Urinary symptoms: no hematuria   Associated symptoms: no fever and no vomiting   Risk factors: no kidney transplant and no urinary catheter       Home Medications Prior to Admission medications   Medication Sig Start Date End Date Taking? Authorizing Provider  albuterol (VENTOLIN HFA) 108 (90 Base) MCG/ACT inhaler Inhale 1-2 puffs into the lungs every 6 (six) hours as needed for wheezing or shortness of breath. 06/08/20   Loura Halt A, NP  budesonide-formoterol (SYMBICORT) 80-4.5 MCG/ACT inhaler Inhale 2 puffs into the lungs 2 (two) times daily.    [provider]  buPROPion (WELLBUTRIN) 75 MG tablet Take 75 mg by mouth daily.  06/13/20   [provider]  Calcium-Magnesium-Vitamin D (CALCIUM MAGNESIUM PO) Take 1 capsule by mouth 2 (two) times daily.    [provider]  Cholecalciferol (VITAMIN D3 PO) Take 1,000 mg by mouth 2 (two) times daily.    [provider]   clopidogrel (PLAVIX) 75 MG tablet Take 1 tablet (75 mg total) by mouth daily. 12/28/20   Ngetich, Dinah C, NP  Coenzyme Q10 (CO Q 10 PO) Take 120 mg by mouth 2 (two) times daily.    [provider]  cyclobenzaprine (FLEXERIL) 10 MG tablet Take 1 tablet (10 mg total) by mouth 3 (three) times daily. Use with caution due to fall risk 02/09/21   Ngetich, Dinah C, NP  diclofenac Sodium (VOLTAREN) 1 % GEL Apply 2 g topically 4 (four) times daily as needed (pain).    [provider]  dorzolamide-timolol (COSOPT) 22.3-6.8 MG/ML ophthalmic solution Place 1 drop into the left eye 2 (two) times daily.    [provider]  fluticasone (FLONASE) 50 MCG/ACT nasal spray Place 1 spray into both nostrils daily. 01/18/21   Mercy Riding, MD  furosemide (LASIX) 20 MG tablet Take 20 mg by mouth every other day.    [provider]  guaiFENesin (MUCINEX) 600 MG 12 hr tablet Take 1 tablet (600 mg total) by mouth 2 (two) times daily. 02/09/21   Ngetich, Dinah C, NP  latanoprost (XALATAN) 0.005 % ophthalmic solution Place 1 drop into both eyes at bedtime.    [provider]  levothyroxine (SYNTHROID) 137 MCG tablet Take 68.5 mcg by mouth daily before breakfast. empty stomach 1 hour before a meal with glass of water.    [provider]  loratadine (CLARITIN) 10 MG tablet Take 1 tablet (10 mg total) by  mouth daily. 01/19/21   Mercy Riding, MD  mirtazapine (REMERON) 15 MG tablet Take 1 tablet (15 mg total) by mouth at bedtime. 02/09/21   Ngetich, Dinah C, NP  OVER THE COUNTER MEDICATION Take 1 drop by mouth daily. Vitamin B12    [provider]  pantoprazole (PROTONIX) 40 MG tablet Take 40 mg by mouth daily. 11/01/20   [provider]  polyethylene glycol (MIRALAX / GLYCOLAX) 17 g packet Take 17 g by mouth 2 (two) times daily as needed for mild constipation. 01/18/21   Mercy Riding, MD  QUEtiapine (SEROQUEL) 25 MG tablet Take 1 tablet (25 mg total) by mouth at  bedtime. 03/28/21   Kathrynn Ducking, MD      Allergies    Crestor [rosuvastatin], Hydromorphone, Lipitor [atorvastatin], Macrobid [nitrofurantoin], and Morphine and related    Review of Systems   Review of Systems  Constitutional:  Negative for fever.  HENT:  Negative for facial swelling.   Eyes:  Negative for redness.  Respiratory:  Positive for cough and shortness of breath.   Cardiovascular:  Negative for chest pain.  Gastrointestinal:  Negative for vomiting.  Genitourinary:  Positive for dysuria.  Musculoskeletal:  Negative for neck pain.  Skin:  Negative for rash.  Neurological:  Negative for facial asymmetry.  Psychiatric/Behavioral:  Negative for agitation.   All other systems reviewed and are negative.  Physical Exam Updated Vital Signs BP (!) 187/138 Comment: patient very restless   Pulse 82    Temp 97.7 F (36.5 C) (Oral)    Resp (!) 38    Ht 5\' 3"  (1.6 m)    Wt 64.9 kg    SpO2 93%    BMI 25.35 kg/m  Physical Exam Vitals and nursing note reviewed.  Constitutional:      Appearance: Normal appearance. She is not diaphoretic.  HENT:     Head: Normocephalic and atraumatic.     Nose: Nose normal.  Eyes:     Conjunctiva/sclera: Conjunctivae normal.     Pupils: Pupils are equal, round, and reactive to light.  Cardiovascular:     Rate and Rhythm: Normal rate and regular rhythm.     Pulses: Normal pulses.     Heart sounds: Normal heart sounds.  Pulmonary:     Effort: Pulmonary effort is normal. No accessory muscle usage.     Breath sounds: Decreased breath sounds present.  Abdominal:     General: Abdomen is flat. Bowel sounds are normal.     Palpations: Abdomen is soft.     Tenderness: There is no abdominal tenderness. There is no guarding.  Musculoskeletal:        General: Normal range of motion.     Cervical back: Normal range of motion and neck supple.  Skin:    General: Skin is warm and dry.     Capillary Refill: Capillary refill takes less than 2 seconds.   Neurological:     General: No focal deficit present.     Mental Status: She is alert and oriented to person, place, and time.     Deep Tendon Reflexes: Reflexes normal.  Psychiatric:        Thought Content: Thought content normal.    ED Results / Procedures / Treatments   Labs (all labs ordered are listed, but only abnormal results are displayed) Results for orders placed or performed during the hospital encounter of 11/22/21  CBC with Differential/Platelet  Result Value Ref Range   WBC 9.5 4.0 -  10.5 K/uL   RBC 5.00 3.87 - 5.11 MIL/uL   Hemoglobin 14.3 12.0 - 15.0 g/dL   HCT 44.1 36.0 - 46.0 %   MCV 88.2 80.0 - 100.0 fL   MCH 28.6 26.0 - 34.0 pg   MCHC 32.4 30.0 - 36.0 g/dL   RDW 14.0 11.5 - 15.5 %   Platelets 371 150 - 400 K/uL   nRBC 0.0 0.0 - 0.2 %   Neutrophils Relative % 58 %   Neutro Abs 5.6 1.7 - 7.7 K/uL   Lymphocytes Relative 23 %   Lymphs Abs 2.2 0.7 - 4.0 K/uL   Monocytes Relative 11 %   Monocytes Absolute 1.1 (H) 0.1 - 1.0 K/uL   Eosinophils Relative 7 %   Eosinophils Absolute 0.6 (H) 0.0 - 0.5 K/uL   Basophils Relative 1 %   Basophils Absolute 0.1 0.0 - 0.1 K/uL   Immature Granulocytes 0 %   Abs Immature Granulocytes 0.03 0.00 - 0.07 K/uL  Basic metabolic panel  Result Value Ref Range   Sodium 132 (L) 135 - 145 mmol/L   Potassium 3.4 (L) 3.5 - 5.1 mmol/L   Chloride 96 (L) 98 - 111 mmol/L   CO2 25 22 - 32 mmol/L   Glucose, Bld 96 70 - 99 mg/dL   BUN 15 8 - 23 mg/dL   Creatinine, Ser 1.26 (H) 0.44 - 1.00 mg/dL   Calcium 9.4 8.9 - 10.3 mg/dL   GFR, Estimated 41 (L) >60 mL/min   Anion gap 11 5 - 15  Brain natriuretic peptide  Result Value Ref Range   B Natriuretic Peptide 90.9 0.0 - 100.0 pg/mL  Urinalysis, Routine w reflex microscopic Urine, Clean Catch  Result Value Ref Range   Color, Urine YELLOW YELLOW   APPearance CLEAR CLEAR   Specific Gravity, Urine 1.010 1.005 - 1.030   pH 7.0 5.0 - 8.0   Glucose, UA NEGATIVE NEGATIVE mg/dL   Hgb urine  dipstick NEGATIVE NEGATIVE   Bilirubin Urine NEGATIVE NEGATIVE   Ketones, ur NEGATIVE NEGATIVE mg/dL   Protein, ur NEGATIVE NEGATIVE mg/dL   Nitrite NEGATIVE NEGATIVE   Leukocytes,Ua MODERATE (A) NEGATIVE  Urinalysis, Microscopic (reflex)  Result Value Ref Range   RBC / HPF 0-5 0 - 5 RBC/hpf   WBC, UA 6-10 0 - 5 WBC/hpf   Bacteria, UA MANY (A) NONE SEEN   Squamous Epithelial / LPF 0-5 0 - 5  I-Stat venous blood gas, ED  Result Value Ref Range   pH, Ven 7.398 7.25 - 7.43   pCO2, Ven 50.9 44 - 60 mmHg   pO2, Ven 20.0 (LL) 32 - 45 mmHg   Bicarbonate 31.6 (H) 20.0 - 28.0 mmol/L   TCO2 33 (H) 22 - 32 mmol/L   O2 Saturation 31 %   Acid-Base Excess 5.0 (H) 0.0 - 2.0 mmol/L   Sodium 136 135 - 145 mmol/L   Potassium 3.6 3.5 - 5.1 mmol/L   Calcium, Ion 1.28 1.15 - 1.40 mmol/L   HCT 45.0 36.0 - 46.0 %   Hemoglobin 15.3 (H) 12.0 - 15.0 g/dL   Patient temperature 97.7 F    Collection site IV start    Drawn by Nurse    Sample type VENOUS    Comment NOTIFIED PHYSICIAN   Troponin I (High Sensitivity)  Result Value Ref Range   Troponin I (High Sensitivity) 13 <18 ng/L  Troponin I (High Sensitivity)  Result Value Ref Range   Troponin I (High Sensitivity) 13 <18 ng/L  CT Angio Chest PE W and/or Wo Contrast  Result Date: 11/23/2021 CLINICAL DATA:  Chest pain and shortness of breath. EXAM: CT ANGIOGRAPHY CHEST WITH CONTRAST TECHNIQUE: Multidetector CT imaging of the chest was performed using the standard protocol during bolus administration of intravenous contrast. Multiplanar CT image reconstructions and MIPs were obtained to evaluate the vascular anatomy. RADIATION DOSE REDUCTION: This exam was performed according to the departmental dose-optimization program which includes automated exposure control, adjustment of the mA and/or kV according to patient size and/or use of iterative reconstruction technique. CONTRAST:  47mL OMNIPAQUE IOHEXOL 350 MG/ML SOLN COMPARISON:  Chest radiograph dated  11/22/2021 and CT dated 03/22/2021. FINDINGS: Evaluation of this exam is limited due to respiratory motion artifact. Cardiovascular: There is no cardiomegaly or pericardial effusion. There is coronary vascular calcification. Left pectoral pacemaker device. Mild atherosclerotic calcification of the thoracic aorta. Top-normal ascending aorta measure up to 4 cm similar to prior CT. No dissection. Evaluation of the pulmonary arteries is limited due to respiratory motion artifact. No central pulmonary artery embolus identified. Mediastinum/Nodes: Mildly enlarged right hilar lymph node measures 12 mm, likely reactive. There is a small hiatal hernia. The esophagus is grossly unremarkable. No mediastinal fluid collection. Lungs/Pleura: Bibasilar atelectasis. There is background of emphysema and diffuse chronic interstitial coarsening. No consolidative changes, pleural effusion, or pneumothorax. The central airways are patent. Upper Abdomen: No acute abnormality. Musculoskeletal: Degenerative changes of the spine. Osteopenia. Old-appearing right rib fractures. Right shoulder fixation. No acute osseous pathology. Review of the MIP images confirms the above findings. IMPRESSION: 1. No acute intrathoracic pathology. No large or central pulmonary artery embolus. 2. Aortic Atherosclerosis (ICD10-I70.0) and Emphysema (ICD10-J43.9). Electronically Signed   By: Anner Crete M.D.   On: 11/23/2021 02:04   DG Chest Portable 1 View  Result Date: 11/22/2021 CLINICAL DATA:  Short of breath, nonproductive cough, hypoxia EXAM: PORTABLE CHEST 1 VIEW COMPARISON:  03/22/2021 FINDINGS: Single frontal view of the chest demonstrates dual lead pacer overlying left chest. The cardiac silhouette is unremarkable. No acute airspace disease, effusion, or pneumothorax. Stable basilar predominant scarring and fibrosis. Stable hilar prominence consistent with vascular shadow and adenopathy. No acute bony abnormalities. IMPRESSION: 1. Stable  basilar predominant scarring and fibrosis. No acute airspace disease. Electronically Signed   By: Randa Ngo M.D.   On: 11/22/2021 23:18    EKG EKG Interpretation  Date/Time:  Thursday November 22 2021 22:46:53 EST Ventricular Rate:  71 PR Interval:  157 QRS Duration: 133 QT Interval:  434 QTC Calculation: 472 R Axis:   34 Text Interpretation: Sinus rhythm Atrial premature complex Right bundle branch block Confirmed by Randal Buba, Donetta Isaza ([id number]) on 11/22/2021 11:19:57 PM  Radiology CT Angio Chest PE W and/or Wo Contrast  Result Date: 11/23/2021 CLINICAL DATA:  Chest pain and shortness of breath. EXAM: CT ANGIOGRAPHY CHEST WITH CONTRAST TECHNIQUE: Multidetector CT imaging of the chest was performed using the standard protocol during bolus administration of intravenous contrast. Multiplanar CT image reconstructions and MIPs were obtained to evaluate the vascular anatomy. RADIATION DOSE REDUCTION: This exam was performed according to the departmental dose-optimization program which includes automated exposure control, adjustment of the mA and/or kV according to patient size and/or use of iterative reconstruction technique. CONTRAST:  53mL OMNIPAQUE IOHEXOL 350 MG/ML SOLN COMPARISON:  Chest radiograph dated 11/22/2021 and CT dated 03/22/2021. FINDINGS: Evaluation of this exam is limited due to respiratory motion artifact. Cardiovascular: There is no cardiomegaly or pericardial effusion. There is coronary vascular calcification. Left pectoral pacemaker device. Mild atherosclerotic  calcification of the thoracic aorta. Top-normal ascending aorta measure up to 4 cm similar to prior CT. No dissection. Evaluation of the pulmonary arteries is limited due to respiratory motion artifact. No central pulmonary artery embolus identified. Mediastinum/Nodes: Mildly enlarged right hilar lymph node measures 12 mm, likely reactive. There is a small hiatal hernia. The esophagus is grossly unremarkable. No mediastinal  fluid collection. Lungs/Pleura: Bibasilar atelectasis. There is background of emphysema and diffuse chronic interstitial coarsening. No consolidative changes, pleural effusion, or pneumothorax. The central airways are patent. Upper Abdomen: No acute abnormality. Musculoskeletal: Degenerative changes of the spine. Osteopenia. Old-appearing right rib fractures. Right shoulder fixation. No acute osseous pathology. Review of the MIP images confirms the above findings. IMPRESSION: 1. No acute intrathoracic pathology. No large or central pulmonary artery embolus. 2. Aortic Atherosclerosis (ICD10-I70.0) and Emphysema (ICD10-J43.9). Electronically Signed   By: Anner Crete M.D.   On: 11/23/2021 02:04   DG Chest Portable 1 View  Result Date: 11/22/2021 CLINICAL DATA:  Short of breath, nonproductive cough, hypoxia EXAM: PORTABLE CHEST 1 VIEW COMPARISON:  03/22/2021 FINDINGS: Single frontal view of the chest demonstrates dual lead pacer overlying left chest. The cardiac silhouette is unremarkable. No acute airspace disease, effusion, or pneumothorax. Stable basilar predominant scarring and fibrosis. Stable hilar prominence consistent with vascular shadow and adenopathy. No acute bony abnormalities. IMPRESSION: 1. Stable basilar predominant scarring and fibrosis. No acute airspace disease. Electronically Signed   By: Randa Ngo M.D.   On: 11/22/2021 23:18    Procedures Procedures    Medications Ordered in ED Medications  iohexol (OMNIPAQUE) 350 MG/ML injection 60 mL (60 mLs Intravenous Contrast Given 11/23/21 0145)  acetaminophen (TYLENOL) tablet 1,000 mg (1,000 mg Oral Given 11/23/21 0217)  lidocaine (XYLOCAINE) 2 % jelly 1 application (1 application Topical Given 11/23/21 0218)  cefTRIAXone (ROCEPHIN) 1 g in sodium chloride 0.9 % 100 mL IVPB (0 g Intravenous Stopped 11/23/21 0239)  ketorolac (TORADOL) 30 MG/ML injection 15 mg (15 mg Intravenous Given 11/23/21 0243)    ED Course/ Medical Decision Making/  A&P                           Medical Decision Making Patient with hypoxia on 3-4 Liters of O2 at nursing home as well as dysuria for 2 days duration.  No f/c/r. Patient also have a non productive cough   Amount and/or Complexity of Data Reviewed Independent Historian:     Details: daughter see above External Data Reviewed: notes.    Details: outpatient doctor at [facility] 09/10/21 note reviewed Labs: ordered.    Details: personally reviewed all labs, normal white count, Normal creatinine, sodium is slightly low at 132  urinalysis with infection, negative troponin by me. Radiology: ordered and independent interpretation performed.    Details: No PE on my reiew on CTA.  No infiltrate on CXR by my review ECG/medicine tests: ordered and independent interpretation performed.    Details: see my interpretation Discussion of management or test interpretation with external provider(s): Dr. Alcario Drought will admit patient   Risk OTC drugs. Prescription drug management. Decision regarding hospitalization. Risk Details: Given hypoxia on home O2 setting will admit, IV antibiotics initiated.      EKG Interpretation  Date/Time:  Thursday November 22 2021 22:46:53 EST Ventricular Rate:  71 PR Interval:  157 QRS Duration: 133 QT Interval:  434 QTC Calculation: 472 R Axis:   34 Text Interpretation: Sinus rhythm Atrial premature complex Right bundle branch block Confirmed  by Dory Horn) on 11/22/2021 11:19:57 PM        Final Clinical Impression(s) / ED Diagnoses Final diagnoses:  Hypoxia  Urinary tract infection without hematuria, site unspecified   The patient appears reasonably stabilized for admission considering the current resources, flow, and capabilities available in the ED at this time, and I doubt any other [facility] requiring further screening and/or treatment in the ED prior to admission.  Rx / DC Orders ED Discharge Orders     None         Cybele Maule, MD 11/23/21  NO:[id number]

## 2021-11-23 NOTE — ED Notes (Signed)
Daughter deardra notified of transfer [phone]  Daughter will bring DNR

## 2021-11-23 NOTE — ED Notes (Signed)
Weaned to 8L HFNC. Tolerating well

## 2021-11-23 NOTE — Assessment & Plan Note (Addendum)
Patient uses 3 to 4 L of oxygen at baseline.  Was more hypoxic than usual in the mid 80s despite being on her usual home regimen.  Thought to be secondary to COPD exacerbation.  Initially was requiring 10 L of oxygen.   After treatment for COPD exacerbation she is now down to 3 to 4 L of oxygen by nasal cannula.

## 2021-11-23 NOTE — Assessment & Plan Note (Addendum)
Patient here from assisted living facility.  Continue home medications including Wellbutrin and Remeron and Seroquel.  Delirium precautions.

## 2021-11-23 NOTE — H&P (Signed)
History and Physical    Patient: Sheila Mccoy [id number] DOB: 04-14-1931 DOA: 11/22/2021 DOS: the patient was seen and examined on 11/23/2021 PCP: Pcp, No  Patient coming from: ALF/ILF - [place]; NOK: Daughters, [name] [phone]; Ferrilyn Fourdiffe [phone]    Chief Complaint: SOB, hypoxia  HPI: Sheila Mccoy is a 88 y.o. female with medical history significant of bipolar b/o; pacemaker; COPD on 4L home O2; hypothyroidism;  and dementia presenting with SOB, hypoxia. She felt like she had a cold, negative for COVID x 2.  She is on 3-4L home O2 chronically.  The last day or so she was SOB and anxious.  She put a pulse ox on last night and she was in the mid-80s.  +cough, nonproductive.  She has been taking Mucinex.  No fever.  She was never a smoker but lived with heavy smokers as a child.  She had urinary burning yesterday.  She was "in agony" last night with urinary burning.  Symptoms resolved overnight.    ER Course:  [facility] to [facility] transfer, per Dr. Alcario Drought:  Pt with UTI with bladder spasms.   Also hypoxic on her usual 4L, to 83%.  No PE no PNA.  Not wheezing.  Pt is a DNR, declined BIPAP.      Review of Systems: As mentioned in the history of present illness. All other systems reviewed and are negative. Past Medical History:  Diagnosis Date   Acute metabolic encephalopathy [id number]   Adnexal cyst 07/09/2020   Anxiety    Artificial pacemaker    Bipolar disorder ([place])    COPD (chronic obstructive pulmonary disease) (HCC)    Depression    History of CT scan 2021   Legs.   Hypothyroidism 09/30/2020   Memory difficulty 01/02/2021   Sacral fracture ([place]) 07/09/2020   TIA (transient ischemic attack) 09/29/2020   UTI (urinary tract infection) 07/09/2020   Past Surgical History:  Procedure Laterality Date   arm fracture surgery     MRI  2021   Lungs.   right shoulder      pins in right shoulder.   Social History:  reports that she has never smoked. She  has never used smokeless tobacco. She reports that she does not drink alcohol and does not use drugs.  Allergies  Allergen Reactions   Crestor [Rosuvastatin]     Lower extremity weakness, gait instability and confusion   Hydromorphone     Very Disoriented & Confused   Lipitor [Atorvastatin]     Muscle paralysis.   Macrobid [Nitrofurantoin] Other (See Comments)    tired   Morphine And Related     Family History  Problem Relation Age of Onset   Lung cancer Mother    Cancer Sister    Cancer Brother    Cancer Sister    Sarcoidosis Brother    Leukemia Daughter     Prior to Admission medications   Medication Sig Start Date End Date Taking? Authorizing Provider  albuterol (VENTOLIN HFA) 108 (90 Base) MCG/ACT inhaler Inhale 1-2 puffs into the lungs every 6 (six) hours as needed for wheezing or shortness of breath. 06/08/20   Loura Halt A, NP  budesonide-formoterol (SYMBICORT) 80-4.5 MCG/ACT inhaler Inhale 2 puffs into the lungs 2 (two) times daily.    [provider]  buPROPion (WELLBUTRIN) 75 MG tablet Take 75 mg by mouth daily.  06/13/20   [provider]  Calcium-Magnesium-Vitamin D (CALCIUM MAGNESIUM PO) Take 1 capsule by mouth 2 (two) times daily.  [provider]  Cholecalciferol (VITAMIN D3 PO) Take 1,000 mg by mouth 2 (two) times daily.    [provider]  clopidogrel (PLAVIX) 75 MG tablet Take 1 tablet (75 mg total) by mouth daily. 12/28/20   Ngetich, Dinah C, NP  Coenzyme Q10 (CO Q 10 PO) Take 120 mg by mouth 2 (two) times daily.    [provider]  cyclobenzaprine (FLEXERIL) 10 MG tablet Take 1 tablet (10 mg total) by mouth 3 (three) times daily. Use with caution due to fall risk 02/09/21   Ngetich, Dinah C, NP  diclofenac Sodium (VOLTAREN) 1 % GEL Apply 2 g topically 4 (four) times daily as needed (pain).    [provider]  dorzolamide-timolol (COSOPT) 22.3-6.8 MG/ML ophthalmic solution Place 1 drop into the left eye 2 (two)  times daily.    [provider]  fluticasone (FLONASE) 50 MCG/ACT nasal spray Place 1 spray into both nostrils daily. 01/18/21   Mercy Riding, MD  furosemide (LASIX) 20 MG tablet Take 20 mg by mouth every other day.    [provider]  guaiFENesin (MUCINEX) 600 MG 12 hr tablet Take 1 tablet (600 mg total) by mouth 2 (two) times daily. 02/09/21   Ngetich, Dinah C, NP  latanoprost (XALATAN) 0.005 % ophthalmic solution Place 1 drop into both eyes at bedtime.    [provider]  levothyroxine (SYNTHROID) 137 MCG tablet Take 68.5 mcg by mouth daily before breakfast. empty stomach 1 hour before a meal with glass of water.    [provider]  loratadine (CLARITIN) 10 MG tablet Take 1 tablet (10 mg total) by mouth daily. 01/19/21   Mercy Riding, MD  mirtazapine (REMERON) 15 MG tablet Take 1 tablet (15 mg total) by mouth at bedtime. 02/09/21   Ngetich, Dinah C, NP  OVER THE COUNTER MEDICATION Take 1 drop by mouth daily. Vitamin B12    [provider]  pantoprazole (PROTONIX) 40 MG tablet Take 40 mg by mouth daily. 11/01/20   [provider]  polyethylene glycol (MIRALAX / GLYCOLAX) 17 g packet Take 17 g by mouth 2 (two) times daily as needed for mild constipation. 01/18/21   Mercy Riding, MD  QUEtiapine (SEROQUEL) 25 MG tablet Take 1 tablet (25 mg total) by mouth at bedtime. 03/28/21   Kathrynn Ducking, MD    Physical Exam: Vitals:   11/23/21 1200 11/23/21 1230 11/23/21 1345 11/23/21 1523  BP: (!) 131/100 134/73 (!) 102/56 104/83  Pulse: 63 66 61 60  Resp: (!) 22 (!) 27 16 16   Temp:    97.6 F (36.4 C)  TempSrc:    Oral  SpO2: 92% 94% 96% 93%  Weight:      Height:       General:  Appears calm and comfortable and is in NAD Eyes:  EOMI, normal lids, iris ENT:  hard of hearing, grossly normal lips & tongue, mmm; appropriate dentition Neck:  no LAD, masses or thyromegaly Cardiovascular:  RRR, no m/r/g. No LE edema.  Respiratory:   CTA bilaterally  with no wheezes/rales/rhonchi.  Mildly increased respiratory effort. Abdomen:  soft, NT, ND Skin:  no rash or induration seen on limited exam Musculoskeletal:  grossly normal tone BUE/BLE, good ROM, no bony abnormality Psychiatric:  grossly normal mood and affect, speech fluent and appropriate Neurologic:  CN 2-12 grossly intact, moves all extremities in coordinated fashion   Radiological Exams on Admission: Independently reviewed - see discussion in A/P where applicable  CT Angio Chest PE W and/or Wo Contrast  Result Date: 11/23/2021 CLINICAL DATA:  Chest pain and shortness of breath. EXAM: CT ANGIOGRAPHY CHEST WITH CONTRAST TECHNIQUE: Multidetector CT imaging of the chest was performed using the standard protocol during bolus administration of intravenous contrast. Multiplanar CT image reconstructions and MIPs were obtained to evaluate the vascular anatomy. RADIATION DOSE REDUCTION: This exam was performed according to the departmental dose-optimization program which includes automated exposure control, adjustment of the mA and/or kV according to patient size and/or use of iterative reconstruction technique. CONTRAST:  61mL OMNIPAQUE IOHEXOL 350 MG/ML SOLN COMPARISON:  Chest radiograph dated 11/22/2021 and CT dated 03/22/2021. FINDINGS: Evaluation of this exam is limited due to respiratory motion artifact. Cardiovascular: There is no cardiomegaly or pericardial effusion. There is coronary vascular calcification. Left pectoral pacemaker device. Mild atherosclerotic calcification of the thoracic aorta. Top-normal ascending aorta measure up to 4 cm similar to prior CT. No dissection. Evaluation of the pulmonary arteries is limited due to respiratory motion artifact. No central pulmonary artery embolus identified. Mediastinum/Nodes: Mildly enlarged right hilar lymph node measures 12 mm, likely reactive. There is a small hiatal hernia. The esophagus is grossly unremarkable. No mediastinal fluid collection.  Lungs/Pleura: Bibasilar atelectasis. There is background of emphysema and diffuse chronic interstitial coarsening. No consolidative changes, pleural effusion, or pneumothorax. The central airways are patent. Upper Abdomen: No acute abnormality. Musculoskeletal: Degenerative changes of the spine. Osteopenia. Old-appearing right rib fractures. Right shoulder fixation. No acute osseous pathology. Review of the MIP images confirms the above findings. IMPRESSION: 1. No acute intrathoracic pathology. No large or central pulmonary artery embolus. 2. Aortic Atherosclerosis (ICD10-I70.0) and Emphysema (ICD10-J43.9). Electronically Signed   By: Anner Crete M.D.   On: 11/23/2021 02:04   DG Chest Portable 1 View  Result Date: 11/22/2021 CLINICAL DATA:  Short of breath, nonproductive cough, hypoxia EXAM: PORTABLE CHEST 1 VIEW COMPARISON:  03/22/2021 FINDINGS: Single frontal view of the chest demonstrates dual lead pacer overlying left chest. The cardiac silhouette is unremarkable. No acute airspace disease, effusion, or pneumothorax. Stable basilar predominant scarring and fibrosis. Stable hilar prominence consistent with vascular shadow and adenopathy. No acute bony abnormalities. IMPRESSION: 1. Stable basilar predominant scarring and fibrosis. No acute airspace disease. Electronically Signed   By: Randa Ngo M.D.   On: 11/22/2021 23:18    EKG: Independently reviewed.  NSR with rate 71; RBBB   Labs on Admission: I have personally reviewed the available labs and imaging studies at the time of the admission.  Pertinent labs:    VBG: 7.398/50.9 Na++ 132 K+ 3.4 BUN 15/Creatinine 1.26/GFR 41 HS troponin 13, 13 Normal CBC COVID/flu negative UA: moderate LE, many bacteria    Assessment and Plan: * Acute on chronic respiratory failure with hypoxia (HCC)- (present on admission) -Patient was persistently hypoxic into the mid-80s despite her usual 3-4L home O2 -She is currently doing well on 5L -This  appears to be related to COPD exacerbation  COPD with acute exacerbation ([place])- (present on admission) -Patient's shortness of breath, hypoxia, and cough are most likely caused by acute COPD exacerbation.  -She has history of O2-dependent COPD  -Her daughter reports that she does not think Albuterol was being given for rescue at the facility -She does not have fever or leukocytosis.  -Chest x-ray is not consistent with pneumonia; CTA is also negative for PNA and PE -She was given a continuous neb treatment in the ED with some improvement.   -will observe for now and attempt to  wean [place] O2 if possible - she is usually on 3-4L and is currently on 5 (I turned her down from 6L on arrival) -Nebulizers: scheduled Duoneb and prn albuterol -Solu-Medrol 60 mg IV BID -> Prednisone 40 mg PO daily -IV Rocephin -Continue Symbicort (Dulera per formulary), Mucinex, Flonase, Claritin -Coordinated care with TOC team/PT/OT consults  Acute lower UTI- (present on admission) -She had significantly urinary symptoms last night c/w bladder spasms -UA is not overly suggestive of UTI but given her symptoms it is reasonable to treat -She is on Rocephin for COPD and this should cover both -Urine culture is pending  DNR (do not resuscitate)- (present on admission) -I have discussed code status with her daughter and the patient would not desire resuscitation and would prefer to die a natural death should that situation arise. -She has a gold out of facility DNR form with her  Glaucoma (increased eye pressure)- (present on admission) -Continue Cosopt, latanoprost  Memory difficulty- (present on admission) -Patient is coming from ALF -Continue Wellbutrin, Remeron, Seroquel -Will add delirium precautions  TIA (transient ischemic attack)- (present on admission) -Continue Plavix       Advance Care Planning:   Code Status: DNR   Consults: TOC team; PT/OT; nutrition  DVT Prophylaxis: Lovenox  Family  Communication: Daughter was present throughout evaluation  Severity of Illness: The appropriate patient status for this patient is OBSERVATION. Observation status is judged to be reasonable and necessary in order to provide the required intensity of service to ensure the patient's safety. The patient's presenting symptoms, physical exam findings, and initial radiographic and laboratory data in the context of their medical condition is felt to place them at decreased risk for further clinical deterioration. Furthermore, it is anticipated that the patient will be medically stable for discharge from the hospital within 2 midnights of admission.   Author: Karmen Bongo, MD 11/23/2021 5:36 PM  For on call review [email or web address].

## 2021-11-23 NOTE — Progress Notes (Signed)
Patient SPO2 dropped to 82%.  Place patient on 6 liter Hi-Flo nasal cannula.  Patient's SPO2 is 92%.  RT will continue to monitor.

## 2021-11-23 NOTE — Progress Notes (Signed)
RT unable to obtain ABG. 

## 2021-11-23 NOTE — Plan of Care (Signed)

## 2021-11-23 NOTE — Assessment & Plan Note (Addendum)
Continue Cosopt, latanoprost

## 2021-11-23 NOTE — Assessment & Plan Note (Deleted)
-  I have discussed code status with her daughter and the patient would not desire resuscitation and would prefer to die a natural death should that situation arise. -She has a gold out of facility DNR form with her

## 2021-11-23 NOTE — ED Notes (Signed)
[facility] here to transport patient. 

## 2021-11-23 NOTE — ED Notes (Signed)
Patient returned from CT

## 2021-11-23 NOTE — Assessment & Plan Note (Addendum)
Continue Plavix.  No focal neurological deficits noted currently.

## 2021-11-23 NOTE — Assessment & Plan Note (Addendum)
Apparently had significant urinary symptoms with bladder spasm.  UTI suspected even though UA was not significantly abnormal.   Was initially treated with ceftriaxone.  Unfortunately urine culture not sent.  Changed over to Keflex.

## 2021-11-24 DIAGNOSIS — H409 Unspecified glaucoma: Secondary | ICD-10-CM | POA: Diagnosis present

## 2021-11-24 DIAGNOSIS — E871 Hypo-osmolality and hyponatremia: Secondary | ICD-10-CM | POA: Diagnosis present

## 2021-11-24 DIAGNOSIS — Z7951 Long term (current) use of inhaled steroids: Secondary | ICD-10-CM | POA: Diagnosis not present

## 2021-11-24 DIAGNOSIS — Z7902 Long term (current) use of antithrombotics/antiplatelets: Secondary | ICD-10-CM | POA: Diagnosis not present

## 2021-11-24 DIAGNOSIS — Z79890 Hormone replacement therapy (postmenopausal): Secondary | ICD-10-CM | POA: Diagnosis not present

## 2021-11-24 DIAGNOSIS — Z79899 Other long term (current) drug therapy: Secondary | ICD-10-CM | POA: Diagnosis not present

## 2021-11-24 DIAGNOSIS — Z806 Family history of leukemia: Secondary | ICD-10-CM | POA: Diagnosis not present

## 2021-11-24 DIAGNOSIS — N390 Urinary tract infection, site not specified: Secondary | ICD-10-CM

## 2021-11-24 DIAGNOSIS — J441 Chronic obstructive pulmonary disease with (acute) exacerbation: Principal | ICD-10-CM

## 2021-11-24 DIAGNOSIS — Z8673 Personal history of transient ischemic attack (TIA), and cerebral infarction without residual deficits: Secondary | ICD-10-CM | POA: Diagnosis not present

## 2021-11-24 DIAGNOSIS — Z9981 Dependence on supplemental oxygen: Secondary | ICD-10-CM | POA: Diagnosis not present

## 2021-11-24 DIAGNOSIS — Z801 Family history of malignant neoplasm of trachea, bronchus and lung: Secondary | ICD-10-CM | POA: Diagnosis not present

## 2021-11-24 DIAGNOSIS — R0902 Hypoxemia: Secondary | ICD-10-CM | POA: Diagnosis present

## 2021-11-24 DIAGNOSIS — F418 Other specified anxiety disorders: Secondary | ICD-10-CM | POA: Diagnosis present

## 2021-11-24 DIAGNOSIS — E039 Hypothyroidism, unspecified: Secondary | ICD-10-CM | POA: Diagnosis present

## 2021-11-24 DIAGNOSIS — N3289 Other specified disorders of bladder: Secondary | ICD-10-CM | POA: Diagnosis present

## 2021-11-24 DIAGNOSIS — Z20822 Contact with and (suspected) exposure to covid-19: Secondary | ICD-10-CM | POA: Diagnosis present

## 2021-11-24 DIAGNOSIS — Z66 Do not resuscitate: Secondary | ICD-10-CM | POA: Diagnosis present

## 2021-11-24 DIAGNOSIS — J9621 Acute and chronic respiratory failure with hypoxia: Secondary | ICD-10-CM | POA: Diagnosis present

## 2021-11-24 LAB — CBC
HCT: 40.7 % (ref 36.0–46.0)
Hemoglobin: 14 g/dL (ref 12.0–15.0)
MCH: 29 pg (ref 26.0–34.0)
MCHC: 34.4 g/dL (ref 30.0–36.0)
MCV: 84.4 fL (ref 80.0–100.0)
Platelets: 371 10*3/uL (ref 150–400)
RBC: 4.82 MIL/uL (ref 3.87–5.11)
RDW: 13.4 % (ref 11.5–15.5)
WBC: 14.9 10*3/uL — ABNORMAL HIGH (ref 4.0–10.5)
nRBC: 0 % (ref 0.0–0.2)

## 2021-11-24 LAB — BASIC METABOLIC PANEL
Anion gap: 11 (ref 5–15)
BUN: 22 mg/dL (ref 8–23)
CO2: 24 mmol/L (ref 22–32)
Calcium: 9.8 mg/dL (ref 8.9–10.3)
Chloride: 93 mmol/L — ABNORMAL LOW (ref 98–111)
Creatinine, Ser: 1.08 mg/dL — ABNORMAL HIGH (ref 0.44–1.00)
GFR, Estimated: 49 mL/min — ABNORMAL LOW (ref >60)
Glucose, Bld: 155 mg/dL — ABNORMAL HIGH (ref 70–99)
Potassium: 4.4 mmol/L (ref 3.5–5.1)
Sodium: 128 mmol/L — ABNORMAL LOW (ref 135–145)

## 2021-11-24 MED ORDER — SODIUM CHLORIDE 0.9 % IV SOLN: INTRAVENOUS | Status: AC

## 2021-11-24 NOTE — Assessment & Plan Note (Signed)
-   Continue home medications 

## 2021-11-24 NOTE — Progress Notes (Signed)
Physical Therapy Evaluation Patient Details Name: Sheila Mccoy MRN: [id number] DOB: Jul 11, 1931 Today's Date: 11/24/2021  History of Present Illness  88 yo female with onset of SOB and hypoxia in her ALF was admitted 2/16, COPD exacerbation, UTI, hyponatremia.  Pt has PT referred, family involved in her care.  PMHx:  Pacemaker, COPD, chronic O2 use, hypothyroidism, dementia, glaucoma, anxiety, depression, TIA  Clinical Impression  Pt was seen for mobility of gait and transfers, assisted to bathroom and observed her approach to safety.  Will need ALF staff to supervise her initially to walk, but does tend to want to get up and move fast.  Her safety concerns can be mitigated with staff involvement, and will recommend her to have home health PT for recovery of her previous more independent gait with RW.  Follow for goals of acute PT.     Recommendations for follow up therapy are one component of a multi-disciplinary discharge planning process, led by the attending physician.  Recommendations may be updated based on patient status, additional functional criteria and insurance authorization.  Follow Up Recommendations Home health PT    Assistance Recommended at Discharge Intermittent Supervision/Assistance  Patient can return home with the following  A little help with walking and/or transfers;A little help with bathing/dressing/bathroom;Assistance with cooking/housework;Help with stairs or ramp for entrance;Assist for transportation    Equipment Recommendations None recommended by PT;Rolling walker (2 wheels) (none if pt in fact has a walker at home)  Recommendations for Other Services       Functional Status Assessment Patient has had a recent decline in their functional status and demonstrates the ability to make significant improvements in function in a reasonable and predictable amount of time.     Precautions / Restrictions Precautions Precautions: Fall Precaution Comments: monitor O2  sats Restrictions Weight Bearing Restrictions: No      Mobility  Bed Mobility Overal bed mobility: Needs Assistance Bed Mobility: Supine to Sit     Supine to sit: Min assist     General bed mobility comments: min assist due to pt having voided on bed as soon as purwick was removed    Transfers Overall transfer level: Needs assistance Equipment used: Rolling walker (2 wheels) Transfers: Sit to/from Stand Sit to Stand: Min guard           General transfer comment: pt is using walker early on in task to support standing    Ambulation/Gait Ambulation/Gait assistance: Min guard Gait Distance (Feet): 45 Feet Assistive device: Rolling walker (2 wheels), 1 person hand held assist Gait Pattern/deviations: Step-to pattern, Step-through pattern, Narrow base of support, Drifts right/left Gait velocity: reduced Gait velocity interpretation: <1.31 ft/sec, indicative of household ambulator Pre-gait activities: cues for posture and safety of lines General Gait Details: pt is able to use walker to assist herself, but is unsafe without minor cues due to IV and O2 lines.  Tends to rush the task, and requires cue to not walk backwards 6' to set up to sit  Stairs            Wheelchair Mobility    Modified Rankin (Stroke Patients Only)       Balance Overall balance assessment: Needs assistance Sitting-balance support: Feet supported Sitting balance-Leahy Scale: Fair     Standing balance support: Bilateral upper extremity supported, During functional activity Standing balance-Leahy Scale: Poor  Pertinent Vitals/Pain Pain Assessment Pain Assessment: No/denies pain    Home Living Family/patient expects to be discharged to:: Assisted living Living Arrangements: Other (Comment) (has ALF staff)               Home Equipment: Other (comment) (in a healthcare environment but pt not able to give details about what is  there) Additional Comments: on O2 baseline per chart 4-5 L, at 10L at admission    Prior Function Prior Level of Function : Needs assist       Physical Assist : Mobility (physical) Mobility (physical): Gait   Mobility Comments: per pt used walker ADLs Comments: in ALF so did have access to assistance     Hand Dominance   Dominant Hand: Right    Extremity/Trunk Assessment   Upper Extremity Assessment Upper Extremity Assessment: Generalized weakness    Lower Extremity Assessment Lower Extremity Assessment: Generalized weakness    Cervical / Trunk Assessment Cervical / Trunk Assessment: Kyphotic  Communication   Communication: HOH  Cognition Arousal/Alertness: Awake/alert Behavior During Therapy: Impulsive Overall Cognitive Status: History of cognitive impairments - at baseline                                 General Comments: dementia per chart        General Comments General comments (skin integrity, edema, etc.): pt is mildly unsteady walking but has been medically challenged with infection, but has potential to return home with HHPT and staff to assist gait in ALF    Exercises     Assessment/Plan    PT Assessment Patient needs continued PT services  PT Problem List Decreased strength;Decreased balance;Decreased mobility;Decreased cognition;Decreased safety awareness;Cardiopulmonary status limiting activity       PT Treatment Interventions DME instruction;Gait training;Functional mobility training;Therapeutic activities;Therapeutic exercise;Balance training;Neuromuscular re-education;Patient/family education    PT Goals (Current goals can be found in the Care Plan section)  Acute Rehab PT Goals Patient Stated Goal: none stated PT Goal Formulation: Patient unable to participate in goal setting Time For Goal Achievement: 12/01/21 Potential to Achieve Goals: Good    Frequency Min 3X/week     Co-evaluation               AM-PAC PT  "6 Clicks" Mobility  Outcome Measure Help needed turning from your back to your side while in a flat bed without using bedrails?: A Little Help needed moving from lying on your back to sitting on the side of a flat bed without using bedrails?: A Little Help needed moving to and from a bed to a chair (including a wheelchair)?: A Little Help needed standing up from a chair using your arms (e.g., wheelchair or bedside chair)?: A Little Help needed to walk in hospital room?: A Little Help needed climbing 3-5 steps with a railing? : A Little 6 Click Score: 18    End of Session Equipment Utilized During Treatment: Gait belt;Oxygen Activity Tolerance: Patient limited by fatigue;Other (comment) (impulsivity) Patient left: in chair;with call bell/phone within reach;with chair alarm set Nurse Communication: Mobility status PT Visit Diagnosis: Unsteadiness on feet (R26.81);Muscle weakness (generalized) (M62.81)    Time: 7124-5809 PT Time Calculation (min) (ACUTE ONLY): 39 min   Charges:   PT Evaluation $PT Eval Moderate Complexity: 1 Mod PT Treatments $Gait Training: 8-22 mins $Therapeutic Exercise: 8-22 mins       Ivar Drape 11/24/2021, 12:53 PM  Samul Dada, PT PhD Acute Rehab  Dept. Number: [phone] [phone] and [facility] [phone]

## 2021-11-24 NOTE — Progress Notes (Signed)
[facility] PROGRESS NOTE   Nadalee Neiswender INO:[id number] DOB: [birth year] DOA: 11/22/2021  0 DOS: the patient was seen and examined on 11/24/2021  PCP: Pcp, No  Brief History and Hospital Course:  88 y.o. female with medical history significant of bipolar b/o; pacemaker; COPD on 4L home O2; hypothyroidism;  and dementia presented from her assisted living facility with shortness of breath.  She was noted to be hypoxic more than usual.  She is on 3 to 4 L of oxygen chronically.  Thought to have COPD exacerbation.  She was hospitalized for further management.  Also found to have UTI.    Consultants: None  Procedures: None    Subjective: Patient mentions that she is feeling better this morning compared to yesterday.  Still short of breath.  Denies any abdominal pain nausea or vomiting.  Feels thirsty.    Assessment/Plan:   * Acute on chronic respiratory failure with hypoxia (HCC)- (present on admission) Patient uses 3 to 4 L of oxygen at baseline.  Was more hypoxic than usual in the mid 80s despite being on her usual home regimen.  Thought to be secondary to COPD exacerbation.  Initially was requiring 10 L of oxygen.  Now on 6 L of oxygen.  Continue to monitor closely.  COPD with acute exacerbation (HCC)- (present on admission) Patient with oxygen dependent COPD.  Here with COPD exacerbation.  Chest x-ray did not show any pneumonia.  Patient receiving steroids nebulizer treatment.  Also on inhaled steroids.  Also on ceftriaxone. Patient mentions that she is feeling better this morning.  Continue current treatment for now. Wean down oxygen as tolerated.  Acute lower UTI- (present on admission) Apparently had significant urinary symptoms with bladder spasm.  UTI suspected even though UA was not significantly abnormal.  Continue ceftriaxone for now.  Urine cultures pending.  Hyponatremia- (present on admission) Old labs reviewed.  She has had some degree of hyponatremia.  There  could be an element of hypovolemia.  We will gently hydrate her.  Recheck labs tomorrow.  Glaucoma (increased eye pressure)- (present on admission) Continue Cosopt, latanoprost  Memory difficulty- (present on admission) Patient here from assisted living facility.  Continue home medications including Wellbutrin and Remeron and Seroquel.  Delirium precautions.    TIA (transient ischemic attack)- (present on admission) Continue Plavix.  No focal neurological deficits noted currently.  Anxiety and depression- (present on admission) Continue home medications.  Hypothyroidism- (present on admission) Continue levothyroxine.    DVT Prophylaxis: Lovenox Code Status: DNR Family Communication: No family at bedside.  We will update daughter later today Disposition Plan: Hopefully back to ALF when improved  Status is: Observation The patient will require care spanning > 2 midnights and should be moved to inpatient because: Acute respiratory failure with hypoxia requiring higher amounts of oxygen, COPD exacerbation     Medications: Scheduled:  buPROPion  75 mg Oral Daily   clopidogrel  75 mg Oral Daily   cycloSPORINE  1 drop Both Eyes BID   docusate sodium  100 mg Oral BID   dorzolamide-timolol  1 drop Left Eye BID   enoxaparin (LOVENOX) injection  30 mg Subcutaneous Q24H   fluticasone  1 spray Each Nare Daily   fluticasone furoate-vilanterol  1 puff Inhalation Daily   guaiFENesin  600 mg Oral BID   ipratropium-albuterol  3 mL Nebulization Q6H   latanoprost  1 drop Both Eyes QHS   levothyroxine  68.5 mcg Oral QAC breakfast   loratadine  10 mg  Oral Daily   mirtazapine  15 mg Oral QHS   pantoprazole  40 mg Oral Daily   polyvinyl alcohol  1 drop Both Eyes QID   predniSONE  40 mg Oral Q breakfast   QUEtiapine  25 mg Oral QHS   Continuous:  cefTRIAXone (ROCEPHIN)  IV 1 g (11/24/21 0229)   WUJ:[id number]:acetaminophen **OR** acetaminophen, albuterol, bisacodyl, hydrALAZINE, ondansetron **OR**  ondansetron (ZOFRAN) IV, oxyCODONE, polyethylene glycol, zolpidem  Antibiotics: Anti-infectives (From admission, onward)    Start     Dose/Rate Route Frequency Ordered Stop   11/24/21 0200  cefTRIAXone (ROCEPHIN) 1 g in sodium chloride 0.9 % 100 mL IVPB        1 g 200 mL/hr over 30 Minutes Intravenous Every 24 hours 11/23/21 1720 11/28/21 0159   11/23/21 0215  cefTRIAXone (ROCEPHIN) 1 g in sodium chloride 0.9 % 100 mL IVPB        1 g 200 mL/hr over 30 Minutes Intravenous  Once 11/23/21 0210 11/23/21 0239       Objective:  Vital Signs  Vitals:   11/23/21 2100 11/24/21 0612 11/24/21 0736 11/24/21 0755  BP: (!) 147/63 (!) 142/84  (!) 161/72  Pulse: 63 65  72  Resp: 16 18  20   Temp: 97.9 F (36.6 C)   98 F (36.7 C)  TempSrc: Oral   Oral  SpO2: 99% 92% 91% 94%  Weight:      Height:        Intake/Output Summary (Last 24 hours) at 11/24/2021 1030 Last data filed at 11/24/2021 0836 Gross per 24 hour  Intake 240 ml  Output 150 ml  Net 90 ml   Filed Weights   11/22/21 2239  Weight: 64.9 kg    General appearance: Awake alert.  In no distress Resp: Mildly tachypneic.  No use of accessory muscles.  Coarse breath sounds bilaterally with few wheezes.  No rhonchi.  Few crackles at the bases. Cardio: S1-S2 is normal regular.  No S3-S4.  No rubs murmurs or bruit GI: Abdomen is soft.  Nontender nondistended.  Bowel sounds are present normal.  No masses organomegaly Extremities: No edema.  Full range of motion of lower extremities. Neurologic:  No focal neurological deficits.    Lab Results:  Data Reviewed: I have personally reviewed labs and imaging study reports  CBC: Recent Labs  Lab 11/22/21 2301 11/23/21 0107 11/24/21 0219  WBC 9.5  --  14.9*  NEUTROABS 5.6  --   --   HGB 14.3 15.3* 14.0  HCT 44.1 45.0 40.7  MCV 88.2  --  84.4  PLT 371  --  371    Basic Metabolic Panel: Recent Labs  Lab 11/22/21 2301 11/23/21 0107 11/24/21 0219  NA 132* 136 128*  K 3.4*  3.6 4.4  CL 96*  --  93*  CO2 25  --  24  GLUCOSE 96  --  155*  BUN 15  --  22  CREATININE 1.26*  --  1.08*  CALCIUM 9.4  --  9.8    GFR: Estimated Creatinine Clearance: 32.6 mL/min (A) (by C-G formula based on SCr of 1.08 mg/dL (H)).    Recent Results (from the past 240 hour(s))  Resp Panel by RT-PCR (Flu A&B, Covid) Nasopharyngeal Swab     Status: None   Collection Time: 11/23/21  8:40 AM   Specimen: Nasopharyngeal Swab; Nasopharyngeal(NP) swabs in vial transport medium  Result Value Ref Range Status   SARS Coronavirus 2 by RT PCR NEGATIVE NEGATIVE Final  Comment: (NOTE) SARS-CoV-2 target nucleic acids are NOT DETECTED.  The SARS-CoV-2 RNA is generally detectable in upper respiratory specimens during the acute phase of infection. The lowest concentration of SARS-CoV-2 viral copies this assay can detect is 138 copies/mL. A negative result does not preclude SARS-Cov-2 infection and should not be used as the sole basis for treatment or other patient management decisions. A negative result may occur with  improper specimen collection/handling, submission of specimen other than nasopharyngeal swab, presence of viral mutation(s) within the areas targeted by this assay, and inadequate number of viral copies(<138 copies/mL). A negative result must be combined with clinical observations, patient history, and epidemiological information. The expected result is Negative.  Fact Sheet for Patients:  BloggerCourse.com  Fact Sheet for Healthcare Providers:  SeriousBroker.it  This test is no t yet approved or cleared by the [place] FDA and  has been authorized for detection and/or diagnosis of SARS-CoV-2 by FDA under an Emergency Use Authorization (EUA). This EUA will remain  in effect (meaning this test can be used) for the duration of the COVID-19 declaration under Section 564(b)(1) of the Act, 21 U.S.C.section  360bbb-3(b)(1), unless the authorization is terminated  or revoked sooner.       Influenza A by PCR NEGATIVE NEGATIVE Final   Influenza B by PCR NEGATIVE NEGATIVE Final    Comment: (NOTE) The Xpert Xpress SARS-CoV-2/FLU/RSV plus assay is intended as an aid in the diagnosis of influenza from Nasopharyngeal swab specimens and should not be used as a sole basis for treatment. Nasal washings and aspirates are unacceptable for Xpert Xpress SARS-CoV-2/FLU/RSV testing.  Fact Sheet for Patients: BloggerCourse.com  Fact Sheet for Healthcare Providers: SeriousBroker.it  This test is not yet approved or cleared by the [place] FDA and has been authorized for detection and/or diagnosis of SARS-CoV-2 by FDA under an Emergency Use Authorization (EUA). This EUA will remain in effect (meaning this test can be used) for the duration of the COVID-19 declaration under Section 564(b)(1) of the Act, 21 U.S.C. section 360bbb-3(b)(1), unless the authorization is terminated or revoked.  Performed at [facility], [address], [place], [place] [address]       Radiology Studies: CT Angio Chest PE W and/or Wo Contrast  Result Date: 11/23/2021 CLINICAL DATA:  Chest pain and shortness of breath. EXAM: CT ANGIOGRAPHY CHEST WITH CONTRAST TECHNIQUE: Multidetector CT imaging of the chest was performed using the standard protocol during bolus administration of intravenous contrast. Multiplanar CT image reconstructions and MIPs were obtained to evaluate the vascular anatomy. RADIATION DOSE REDUCTION: This exam was performed according to the departmental dose-optimization program which includes automated exposure control, adjustment of the mA and/or kV according to patient size and/or use of iterative reconstruction technique. CONTRAST:  8mL OMNIPAQUE IOHEXOL 350 MG/ML SOLN COMPARISON:  Chest radiograph dated 11/22/2021 and CT dated 03/22/2021.  FINDINGS: Evaluation of this exam is limited due to respiratory motion artifact. Cardiovascular: There is no cardiomegaly or pericardial effusion. There is coronary vascular calcification. Left pectoral pacemaker device. Mild atherosclerotic calcification of the thoracic aorta. Top-normal ascending aorta measure up to 4 cm similar to prior CT. No dissection. Evaluation of the pulmonary arteries is limited due to respiratory motion artifact. No central pulmonary artery embolus identified. Mediastinum/Nodes: Mildly enlarged right hilar lymph node measures 12 mm, likely reactive. There is a small hiatal hernia. The esophagus is grossly unremarkable. No mediastinal fluid collection. Lungs/Pleura: Bibasilar atelectasis. There is background of emphysema and diffuse chronic interstitial coarsening. No  consolidative changes, pleural effusion, or pneumothorax. The central airways are patent. Upper Abdomen: No acute abnormality. Musculoskeletal: Degenerative changes of the spine. Osteopenia. Old-appearing right rib fractures. Right shoulder fixation. No acute osseous pathology. Review of the MIP images confirms the above findings. IMPRESSION: 1. No acute intrathoracic pathology. No large or central pulmonary artery embolus. 2. Aortic Atherosclerosis (ICD10-I70.0) and Emphysema (ICD10-J43.9). Electronically Signed   By: Elgie Collard M.D.   On: 11/23/2021 02:04   DG Chest Portable 1 View  Result Date: 11/22/2021 CLINICAL DATA:  Short of breath, nonproductive cough, hypoxia EXAM: PORTABLE CHEST 1 VIEW COMPARISON:  03/22/2021 FINDINGS: Single frontal view of the chest demonstrates dual lead pacer overlying left chest. The cardiac silhouette is unremarkable. No acute airspace disease, effusion, or pneumothorax. Stable basilar predominant scarring and fibrosis. Stable hilar prominence consistent with vascular shadow and adenopathy. No acute bony abnormalities. IMPRESSION: 1. Stable basilar predominant scarring and fibrosis.  No acute airspace disease. Electronically Signed   By: Sharlet Salina M.D.   On: 11/22/2021 23:18       LOS: 0 days   Amaru Burroughs Rito Ehrlich  [facility] Pager on [email or web address]  11/24/2021, 10:30 AM

## 2021-11-24 NOTE — Plan of Care (Signed)

## 2021-11-24 NOTE — Hospital Course (Addendum)
88 y.o. female with medical history significant of bipolar b/o; pacemaker; COPD on 4L home O2; hypothyroidism;  and dementia presented from her assisted living facility with shortness of breath.  She was noted to be hypoxic more than usual.  She is on 3 to 4 L of oxygen chronically.  Thought to have COPD exacerbation.  She was hospitalized for further management.  Also found to have UTI.   Patient was given nebulizer treatments and antibiotics.  She was also given steroids.  She was slow to improve but seems to be close to baseline.

## 2021-11-24 NOTE — Assessment & Plan Note (Signed)
Continue levothyroxine 

## 2021-11-24 NOTE — Assessment & Plan Note (Addendum)
Old labs reviewed.  She has had some degree of chronic hyponatremia.  It was felt that she had some hypovolemia.  She was given gentle IV hydration.  Sodium level has improved.  IV fluids were discontinued.

## 2021-11-25 ENCOUNTER — Inpatient Hospital Stay (HOSPITAL_COMMUNITY): Payer: Medicare HMO

## 2021-11-25 LAB — CBC
HCT: 40.5 % (ref 36.0–46.0)
Hemoglobin: 13.4 g/dL (ref 12.0–15.0)
MCH: 28.9 pg (ref 26.0–34.0)
MCHC: 33.1 g/dL (ref 30.0–36.0)
MCV: 87.3 fL (ref 80.0–100.0)
Platelets: 360 10*3/uL (ref 150–400)
RBC: 4.64 MIL/uL (ref 3.87–5.11)
RDW: 13.9 % (ref 11.5–15.5)
WBC: 17.4 10*3/uL — ABNORMAL HIGH (ref 4.0–10.5)
nRBC: 0 % (ref 0.0–0.2)

## 2021-11-25 LAB — BASIC METABOLIC PANEL
Anion gap: 10 (ref 5–15)
BUN: 22 mg/dL (ref 8–23)
CO2: 23 mmol/L (ref 22–32)
Calcium: 8.8 mg/dL — ABNORMAL LOW (ref 8.9–10.3)
Chloride: 103 mmol/L (ref 98–111)
Creatinine, Ser: 0.93 mg/dL (ref 0.44–1.00)
GFR, Estimated: 59 mL/min — ABNORMAL LOW (ref >60)
Glucose, Bld: 99 mg/dL (ref 70–99)
Potassium: 4 mmol/L (ref 3.5–5.1)
Sodium: 136 mmol/L (ref 135–145)

## 2021-11-25 MED ORDER — BUDESONIDE 0.5 MG/2ML IN SUSP: 0.5000 mg | Freq: Two times a day (BID) | RESPIRATORY_TRACT | Status: DC

## 2021-11-25 MED ORDER — POLYETHYLENE GLYCOL 3350 17 G PO PACK
17.0000 g | PACK | Freq: Two times a day (BID) | ORAL | Status: DC
  Administered 2021-11-25 – 2021-11-27 (×5): 17 g via ORAL
  Filled 2021-11-25 (×5): qty 1

## 2021-11-25 MED ORDER — CEPHALEXIN 500 MG PO CAPS
500.0000 mg | ORAL_CAPSULE | Freq: Three times a day (TID) | ORAL | Status: DC
Start: 2021-11-25 — End: 2021-11-27
  Administered 2021-11-25 – 2021-11-27 (×6): 500 mg via ORAL
  Filled 2021-11-25 (×7): qty 1

## 2021-11-25 MED ORDER — ALBUTEROL SULFATE (2.5 MG/3ML) 0.083% IN NEBU
2.5000 mg | INHALATION_SOLUTION | RESPIRATORY_TRACT | Status: DC | PRN
  Administered 2021-11-25: 2.5 mg via RESPIRATORY_TRACT
  Filled 2021-11-25: qty 3

## 2021-11-25 MED ORDER — ALBUTEROL SULFATE HFA 108 (90 BASE) MCG/ACT IN AERS: 2.0000 | INHALATION_SPRAY | RESPIRATORY_TRACT | Status: DC | PRN

## 2021-11-25 MED ORDER — ARFORMOTEROL TARTRATE 15 MCG/2ML IN NEBU
15.0000 ug | INHALATION_SOLUTION | Freq: Two times a day (BID) | RESPIRATORY_TRACT | Status: DC
  Administered 2021-11-25 – 2021-11-27 (×4): 15 ug via RESPIRATORY_TRACT
  Filled 2021-11-25 (×4): qty 2

## 2021-11-25 MED ORDER — BUDESONIDE 0.5 MG/2ML IN SUSP
0.5000 mg | Freq: Two times a day (BID) | RESPIRATORY_TRACT | Status: DC
  Administered 2021-11-25 – 2021-11-27 (×4): 0.5 mg via RESPIRATORY_TRACT
  Filled 2021-11-25 (×4): qty 2

## 2021-11-25 MED ORDER — DOXYCYCLINE HYCLATE 100 MG PO TABS
100.0000 mg | ORAL_TABLET | Freq: Two times a day (BID) | ORAL | Status: DC
  Administered 2021-11-25 – 2021-11-27 (×5): 100 mg via ORAL
  Filled 2021-11-25 (×5): qty 1

## 2021-11-25 MED ORDER — METHYLPREDNISOLONE SODIUM SUCC 125 MG IJ SOLR
60.0000 mg | Freq: Two times a day (BID) | INTRAMUSCULAR | Status: DC
Start: 2021-11-25 — End: 2021-11-27
  Administered 2021-11-25 – 2021-11-27 (×4): 60 mg via INTRAVENOUS
  Filled 2021-11-25 (×5): qty 2

## 2021-11-25 MED ORDER — ENOXAPARIN SODIUM 40 MG/0.4ML IJ SOSY
40.0000 mg | PREFILLED_SYRINGE | INTRAMUSCULAR | Status: DC
  Administered 2021-11-25 – 2021-11-26 (×2): 40 mg via SUBCUTANEOUS
  Filled 2021-11-25 (×2): qty 0.4

## 2021-11-25 NOTE — Progress Notes (Signed)
RT place pt on CPAP for 20 mins. but pt did not tolerate it. States that she is claustrophobic. CPAP at bedside and states that she is feeling better. RT will continue to monitor

## 2021-11-25 NOTE — Progress Notes (Signed)
[facility] PROGRESS NOTE   Sheila Mccoy ZOX:[id number] DOB: 11/22/1930 DOA: 11/22/2021  1 DOS: the patient was seen and examined on 11/25/2021  PCP: Pcp, No  Brief History and Hospital Course:  88 y.o. female with medical history significant of bipolar b/o; pacemaker; COPD on 4L home O2; hypothyroidism;  and dementia presented from her assisted living facility with shortness of breath.  She was noted to be hypoxic more than usual.  She is on 3 to 4 L of oxygen chronically.  Thought to have COPD exacerbation.  She was hospitalized for further management.  Also found to have UTI.   Patient was given nebulizer treatments and antibiotics.  Seems to be improving.  Has been weaned down to 3 L of oxygen from 10 L initially.  Consultants: None  Procedures: None    Subjective: Patient mentions that she is better but still experiencing some shortness of breath.  No chest pain.  No nausea vomiting.      Assessment/Plan:   * Acute on chronic respiratory failure with hypoxia (HCC)- (present on admission) Patient uses 3 to 4 L of oxygen at baseline.  Was more hypoxic than usual in the mid 80s despite being on her usual home regimen.  Thought to be secondary to COPD exacerbation.  Initially was requiring 10 L of oxygen.  Gradually weaned down and now on 3 L oxygen by nasal cannula.  Seems to be improving.    COPD with acute exacerbation (HCC)- (present on admission) Patient with oxygen dependent COPD.  Here with COPD exacerbation.  Chest x-ray did not show any pneumonia.   Patient was started on steroids antibiotics and will nebulizer treatments. She is improving.  Change antibiotics to oral, doxycycline.  Change steroids to oral.  Oxygen has been weaned down to 3 L. Elevated WBC from steroids  Acute lower UTI- (present on admission) Apparently had significant urinary symptoms with bladder spasm.  UTI suspected even though UA was not significantly abnormal.   Was treated with  ceftriaxone.  Unfortunately urine culture not sent.  Will change to Keflex.    Hyponatremia- (present on admission) Old labs reviewed.  She has had some degree of chronic hyponatremia.  It was felt that she had some degree of hypovolemia.  She was given gentle IV hydration.  Sodium level has improved.  Stop IV fluids.  Recheck labs tomorrow.   Glaucoma (increased eye pressure)- (present on admission) Continue Cosopt, latanoprost  Memory difficulty- (present on admission) Patient here from assisted living facility.  Continue home medications including Wellbutrin and Remeron and Seroquel.  Delirium precautions.    TIA (transient ischemic attack)- (present on admission) Continue Plavix.  No focal neurological deficits noted currently.  Anxiety and depression- (present on admission) Continue home medications.  Hypothyroidism- (present on admission) Continue levothyroxine.    DVT Prophylaxis: Lovenox Code Status: DNR Family Communication: No family at bedside.  Daughter updated yesterday. Disposition Plan: Hopefully back to ALF when improved  Status is: Inpatient  Remains inpatient appropriate because: Acute on chronic hypoxic respiratory failure     Medications: Scheduled:  buPROPion  75 mg Oral Daily   cephALEXin  500 mg Oral Q8H   clopidogrel  75 mg Oral Daily   cycloSPORINE  1 drop Both Eyes BID   docusate sodium  100 mg Oral BID   dorzolamide-timolol  1 drop Left Eye BID   doxycycline  100 mg Oral Q12H   enoxaparin (LOVENOX) injection  30 mg Subcutaneous Q24H   fluticasone  1 spray Each Nare Daily   fluticasone furoate-vilanterol  1 puff Inhalation Daily   guaiFENesin  600 mg Oral BID   ipratropium-albuterol  3 mL Nebulization Q6H   latanoprost  1 drop Both Eyes QHS   levothyroxine  68.5 mcg Oral QAC breakfast   loratadine  10 mg Oral Daily   mirtazapine  15 mg Oral QHS   pantoprazole  40 mg Oral Daily   polyethylene glycol  17 g Oral BID   polyvinyl alcohol  1  drop Both Eyes QID   predniSONE  40 mg Oral Q breakfast   QUEtiapine  25 mg Oral QHS   Continuous:   HYW:[id number] **OR** acetaminophen, albuterol, bisacodyl, hydrALAZINE, ondansetron **OR** ondansetron (ZOFRAN) IV, oxyCODONE, zolpidem  Antibiotics: Anti-infectives (From admission, onward)    Start     Dose/Rate Route Frequency Ordered Stop   11/25/21 1400  cephALEXin (KEFLEX) capsule 500 mg        500 mg Oral Every 8 hours 11/25/21 0926     11/25/21 1015  doxycycline (VIBRA-TABS) tablet 100 mg        100 mg Oral Every 12 hours 11/25/21 0926     11/24/21 0200  cefTRIAXone (ROCEPHIN) 1 g in sodium chloride 0.9 % 100 mL IVPB  Status:  Discontinued        1 g 200 mL/hr over 30 Minutes Intravenous Every 24 hours 11/23/21 1720 11/25/21 0926   11/23/21 0215  cefTRIAXone (ROCEPHIN) 1 g in sodium chloride 0.9 % 100 mL IVPB        1 g 200 mL/hr over 30 Minutes Intravenous  Once 11/23/21 0210 11/23/21 0239       Objective:  Vital Signs  Vitals:   11/24/21 2049 11/25/21 0208 11/25/21 0500 11/25/21 0754  BP: (!) 148/65  (!) 142/86   Pulse: 67 67 61   Resp: 14 18 14    Temp: 98.1 F (36.7 C)  98.3 F (36.8 C)   TempSrc:   Oral   SpO2: 93% 93% 98% 96%  Weight:      Height:        Intake/Output Summary (Last 24 hours) at 11/25/2021 0929 Last data filed at 11/25/2021 0654 Gross per 24 hour  Intake 896.25 ml  Output 400 ml  Net 496.25 ml    Filed Weights   11/22/21 2239  Weight: 64.9 kg    General appearance: Awake alert.  In no distress Resp: Less tachypneic.  Improved air entry bilaterally.  No wheezing heard today. Cardio: S1-S2 is normal regular.  No S3-S4.  No rubs murmurs or bruit GI: Abdomen is soft.  Nontender nondistended.  Bowel sounds are present normal.  No masses organomegaly Extremities: No edema.   Neurologic:   No focal neurological deficits.    Lab Results:  Data Reviewed: I have personally reviewed labs and imaging study reports  CBC: Recent  Labs  Lab 11/22/21 2301 11/23/21 0107 11/24/21 0219 11/25/21 0234  WBC 9.5  --  14.9* 17.4*  NEUTROABS 5.6  --   --   --   HGB 14.3 15.3* 14.0 13.4  HCT 44.1 45.0 40.7 40.5  MCV 88.2  --  84.4 87.3  PLT 371  --  371 360     Basic Metabolic Panel: Recent Labs  Lab 11/22/21 2301 11/23/21 0107 11/24/21 0219 11/25/21 0234  NA 132* 136 128* 136  K 3.4* 3.6 4.4 4.0  CL 96*  --  93* 103  CO2 25  --  24 23  GLUCOSE 96  --  155* 99  BUN 15  --  22 22  CREATININE 1.26*  --  1.08* 0.93  CALCIUM 9.4  --  9.8 8.8*     GFR: Estimated Creatinine Clearance: 37.9 mL/min (by C-G formula based on SCr of 0.93 mg/dL).    Recent Results (from the past 240 hour(s))  Resp Panel by RT-PCR (Flu A&B, Covid) Nasopharyngeal Swab     Status: None   Collection Time: 11/23/21  8:40 AM   Specimen: Nasopharyngeal Swab; Nasopharyngeal(NP) swabs in vial transport medium  Result Value Ref Range Status   SARS Coronavirus 2 by RT PCR NEGATIVE NEGATIVE Final    Comment: (NOTE) SARS-CoV-2 target nucleic acids are NOT DETECTED.  The SARS-CoV-2 RNA is generally detectable in upper respiratory specimens during the acute phase of infection. The lowest concentration of SARS-CoV-2 viral copies this assay can detect is 138 copies/mL. A negative result does not preclude SARS-Cov-2 infection and should not be used as the sole basis for treatment or other patient management decisions. A negative result may occur with  improper specimen collection/handling, submission of specimen other than nasopharyngeal swab, presence of viral mutation(s) within the areas targeted by this assay, and inadequate number of viral copies(<138 copies/mL). A negative result must be combined with clinical observations, patient history, and epidemiological information. The expected result is Negative.  Fact Sheet for Patients:  BloggerCourse.com  Fact Sheet for Healthcare Providers:   SeriousBroker.it  This test is no t yet approved or cleared by the [place] FDA and  has been authorized for detection and/or diagnosis of SARS-CoV-2 by FDA under an Emergency Use Authorization (EUA). This EUA will remain  in effect (meaning this test can be used) for the duration of the COVID-19 declaration under Section 564(b)(1) of the Act, 21 U.S.C.section 360bbb-3(b)(1), unless the authorization is terminated  or revoked sooner.       Influenza A by PCR NEGATIVE NEGATIVE Final   Influenza B by PCR NEGATIVE NEGATIVE Final    Comment: (NOTE) The Xpert Xpress SARS-CoV-2/FLU/RSV plus assay is intended as an aid in the diagnosis of influenza from Nasopharyngeal swab specimens and should not be used as a sole basis for treatment. Nasal washings and aspirates are unacceptable for Xpert Xpress SARS-CoV-2/FLU/RSV testing.  Fact Sheet for Patients: BloggerCourse.com  Fact Sheet for Healthcare Providers: SeriousBroker.it  This test is not yet approved or cleared by the [place] FDA and has been authorized for detection and/or diagnosis of SARS-CoV-2 by FDA under an Emergency Use Authorization (EUA). This EUA will remain in effect (meaning this test can be used) for the duration of the COVID-19 declaration under Section 564(b)(1) of the Act, 21 U.S.C. section 360bbb-3(b)(1), unless the authorization is terminated or revoked.  Performed at [facility], [address] [place], [place] [address]        Radiology Studies: No results found.     LOS: 1 day   Jyssica Rief [employer] on [email or web address]  11/25/2021, 9:29 AM

## 2021-11-25 NOTE — Plan of Care (Signed)
°  Problem: Education: Goal: Knowledge of General Education information will improve Description: Including pain rating scale, medication(s)/side effects and non-pharmacologic comfort measures Outcome: Progressing   Problem: Clinical Measurements: Goal: Will remain free from infection Outcome: Progressing   Problem: Activity: Goal: Risk for activity intolerance will decrease Outcome: Progressing   Problem: Coping: Goal: Level of anxiety will decrease Outcome: Progressing   Problem: Elimination: Goal: Will not experience complications related to bowel motility Outcome: Progressing   Problem: Skin Integrity: Goal: Risk for impaired skin integrity will decrease Outcome: Progressing   Problem: Safety: Goal: Ability to remain free from injury will improve Outcome: Progressing

## 2021-11-25 NOTE — Progress Notes (Signed)
Dr. Maryland Pink notified of labored respirations, complaints of SOB, wheezing, and decreased O2 sats with increased pulse rate while out of bed.  Orders received for chest x-ray and CPAP.

## 2021-11-26 LAB — GLUCOSE, CAPILLARY: Glucose-Capillary: 193 mg/dL — ABNORMAL HIGH (ref 70–99)

## 2021-11-26 NOTE — Progress Notes (Signed)
Patient refused cpap/bipap tonight.

## 2021-11-26 NOTE — Progress Notes (Signed)
[facility] PROGRESS NOTE   Madalen Ozan POE:[id number] DOB: [birth year] DOA: 11/22/2021  2 DOS: the patient was seen and examined on 11/26/2021  PCP: Pcp, No  Brief History and Hospital Course:  88 y.o. female with medical history significant of bipolar b/o; pacemaker; COPD on 4L home O2; hypothyroidism;  and dementia presented from her assisted living facility with shortness of breath.  She was noted to be hypoxic more than usual.  She is on 3 to 4 L of oxygen chronically.  Thought to have COPD exacerbation.  She was hospitalized for further management.  Also found to have UTI.   Patient was given nebulizer treatments and antibiotics.  Oxygenation improved.  She was weaned down to 3 L.  However in the last 24 hours her respiratory status worsened.  She was wheezing more yesterday evening.  Consultants: None  Procedures: None    Subjective: Patient mentions that she is feeling better this morning compared to yesterday evening.  Denies any chest pain.  No nausea vomiting.  Has not been out of the bed yet.   Assessment/Plan:   * Acute on chronic respiratory failure with hypoxia (HCC)- (present on admission) Patient uses 3 to 4 L of oxygen at baseline.  Was more hypoxic than usual in the mid 80s despite being on her usual home regimen.  Thought to be secondary to COPD exacerbation.  Initially was requiring 10 L of oxygen.  She was weaned down back to 3 L.  However looks like good in the last 24 hours she has been more short of breath.  Noted to be on 5 L of oxygen this morning.  Saturations in the mid 90s. Continue to wean down as tolerated.  Maintain sats greater than 88%.  COPD with acute exacerbation (HCC)- (present on admission) Patient with oxygen dependent COPD.  Here with COPD exacerbation.  Chest x-ray did not show any pneumonia.   Patient was started on steroids antibiotics and will nebulizer treatments. She had a setback yesterday.  Placed back on Solu-Medrol.  Also  started on budesonide nebulizer as as well as Brovana.  Seems to be doing better this morning.  Continue to monitor.  Continue current treatment for now. Chest x-ray done yesterday afternoon did not show any acute findings. Elevated WBC from steroids  Acute lower UTI- (present on admission) Apparently had significant urinary symptoms with bladder spasm.  UTI suspected even though UA was not significantly abnormal.   Was initially treated with ceftriaxone.  Unfortunately urine culture not sent.  Changed over to Keflex.  Hyponatremia- (present on admission) Old labs reviewed.  She has had some degree of chronic hyponatremia.  It was felt that she had some hypovolemia.  She was given gentle IV hydration.  Sodium level has improved.  IV fluids were discontinued.  Recheck labs tomorrow.  Glaucoma (increased eye pressure)- (present on admission) Continue Cosopt, latanoprost  Memory difficulty- (present on admission) Patient here from assisted living facility.  Continue home medications including Wellbutrin and Remeron and Seroquel.  Delirium precautions.    TIA (transient ischemic attack)- (present on admission) Continue Plavix.  No focal neurological deficits noted currently.  Anxiety and depression- (present on admission) Continue home medications.  Hypothyroidism- (present on admission) Continue levothyroxine.    DVT Prophylaxis: Lovenox Code Status: DNR Family Communication: No family at bedside.  We will update daughter later today. Disposition Plan: Hopefully back to ALF when improved  Status is: Inpatient  Remains inpatient appropriate because: Acute on chronic hypoxic respiratory  failure     Medications: Scheduled:  arformoterol  15 mcg Nebulization BID   budesonide (PULMICORT) nebulizer solution  0.5 mg Nebulization BID   buPROPion  75 mg Oral Daily   cephALEXin  500 mg Oral Q8H   clopidogrel  75 mg Oral Daily   cycloSPORINE  1 drop Both Eyes BID   docusate sodium   100 mg Oral BID   dorzolamide-timolol  1 drop Left Eye BID   doxycycline  100 mg Oral Q12H   enoxaparin (LOVENOX) injection  40 mg Subcutaneous Q24H   fluticasone  1 spray Each Nare Daily   guaiFENesin  600 mg Oral BID   ipratropium-albuterol  3 mL Nebulization Q6H   latanoprost  1 drop Both Eyes QHS   levothyroxine  68.5 mcg Oral QAC breakfast   loratadine  10 mg Oral Daily   methylPREDNISolone (SOLU-MEDROL) injection  60 mg Intravenous Q12H   mirtazapine  15 mg Oral QHS   pantoprazole  40 mg Oral Daily   polyethylene glycol  17 g Oral BID   polyvinyl alcohol  1 drop Both Eyes QID   QUEtiapine  25 mg Oral QHS   Continuous:   ZOX:[id number]:acetaminophen **OR** acetaminophen, albuterol, bisacodyl, hydrALAZINE, ondansetron **OR** ondansetron (ZOFRAN) IV, oxyCODONE, zolpidem  Antibiotics: Anti-infectives (From admission, onward)    Start     Dose/Rate Route Frequency Ordered Stop   11/25/21 1400  cephALEXin (KEFLEX) capsule 500 mg        500 mg Oral Every 8 hours 11/25/21 0926     11/25/21 1015  doxycycline (VIBRA-TABS) tablet 100 mg        100 mg Oral Every 12 hours 11/25/21 0926     11/24/21 0200  cefTRIAXone (ROCEPHIN) 1 g in sodium chloride 0.9 % 100 mL IVPB  Status:  Discontinued        1 g 200 mL/hr over 30 Minutes Intravenous Every 24 hours 11/23/21 1720 11/25/21 0926   11/23/21 0215  cefTRIAXone (ROCEPHIN) 1 g in sodium chloride 0.9 % 100 mL IVPB        1 g 200 mL/hr over 30 Minutes Intravenous  Once 11/23/21 0210 11/23/21 0239       Objective:  Vital Signs  Vitals:   11/26/21 0400 11/26/21 0724 11/26/21 0807 11/26/21 0819  BP: 125/68 (!) 158/90    Pulse: 70 67    Resp: 18 19    Temp: 98.1 F (36.7 C) 98.2 F (36.8 C)    TempSrc: Oral Oral    SpO2: 96% 93% 95% 94%  Weight:      Height:        Intake/Output Summary (Last 24 hours) at 11/26/2021 1000 Last data filed at 11/25/2021 2200 Gross per 24 hour  Intake 120 ml  Output --  Net 120 ml    Filed Weights    11/22/21 2239  Weight: 64.9 kg    General appearance: Awake alert.  In no distress Resp: Coarse breath sounds bilaterally.  Scattered wheezing.  No definite rhonchi.  Few crackles at the bases. Cardio: S1-S2 is normal regular.  No S3-S4.  No rubs murmurs or bruit GI: Abdomen is soft.  Nontender nondistended.  Bowel sounds are present normal.  No masses organomegaly Extremities: No edema.  Full range of motion of lower extremities. Neurologic:  No focal neurological deficits.      Lab Results:  Data Reviewed: I have personally reviewed labs and imaging study reports  CBC: Recent Labs  Lab 11/22/21 2301 11/23/21 0107  11/24/21 0219 11/25/21 0234  WBC 9.5  --  14.9* 17.4*  NEUTROABS 5.6  --   --   --   HGB 14.3 15.3* 14.0 13.4  HCT 44.1 45.0 40.7 40.5  MCV 88.2  --  84.4 87.3  PLT 371  --  371 360     Basic Metabolic Panel: Recent Labs  Lab 11/22/21 2301 11/23/21 0107 11/24/21 0219 11/25/21 0234  NA 132* 136 128* 136  K 3.4* 3.6 4.4 4.0  CL 96*  --  93* 103  CO2 25  --  24 23  GLUCOSE 96  --  155* 99  BUN 15  --  22 22  CREATININE 1.26*  --  1.08* 0.93  CALCIUM 9.4  --  9.8 8.8*     GFR: Estimated Creatinine Clearance: 37.9 mL/min (by C-G formula based on SCr of 0.93 mg/dL).    Recent Results (from the past 240 hour(s))  Resp Panel by RT-PCR (Flu A&B, Covid) Nasopharyngeal Swab     Status: None   Collection Time: 11/23/21  8:40 AM   Specimen: Nasopharyngeal Swab; Nasopharyngeal(NP) swabs in vial transport medium  Result Value Ref Range Status   SARS Coronavirus 2 by RT PCR NEGATIVE NEGATIVE Final    Comment: (NOTE) SARS-CoV-2 target nucleic acids are NOT DETECTED.  The SARS-CoV-2 RNA is generally detectable in upper respiratory specimens during the acute phase of infection. The lowest concentration of SARS-CoV-2 viral copies this assay can detect is 138 copies/mL. A negative result does not preclude SARS-Cov-2 infection and should not be used as the  sole basis for treatment or other patient management decisions. A negative result may occur with  improper specimen collection/handling, submission of specimen other than nasopharyngeal swab, presence of viral mutation(s) within the areas targeted by this assay, and inadequate number of viral copies(<138 copies/mL). A negative result must be combined with clinical observations, patient history, and epidemiological information. The expected result is Negative.  Fact Sheet for Patients:  BloggerCourse.com  Fact Sheet for Healthcare Providers:  SeriousBroker.it  This test is no t yet approved or cleared by the [place] FDA and  has been authorized for detection and/or diagnosis of SARS-CoV-2 by FDA under an Emergency Use Authorization (EUA). This EUA will remain  in effect (meaning this test can be used) for the duration of the COVID-19 declaration under Section 564(b)(1) of the Act, 21 U.S.C.section 360bbb-3(b)(1), unless the authorization is terminated  or revoked sooner.       Influenza A by PCR NEGATIVE NEGATIVE Final   Influenza B by PCR NEGATIVE NEGATIVE Final    Comment: (NOTE) The Xpert Xpress SARS-CoV-2/FLU/RSV plus assay is intended as an aid in the diagnosis of influenza from Nasopharyngeal swab specimens and should not be used as a sole basis for treatment. Nasal washings and aspirates are unacceptable for Xpert Xpress SARS-CoV-2/FLU/RSV testing.  Fact Sheet for Patients: BloggerCourse.com  Fact Sheet for Healthcare Providers: SeriousBroker.it  This test is not yet approved or cleared by the [place] FDA and has been authorized for detection and/or diagnosis of SARS-CoV-2 by FDA under an Emergency Use Authorization (EUA). This EUA will remain in effect (meaning this test can be used) for the duration of the COVID-19 declaration under Section 564(b)(1) of the  Act, 21 U.S.C. section 360bbb-3(b)(1), unless the authorization is terminated or revoked.  Performed at [facility], [address], [place], [place] [address]        Radiology Studies: DG CHEST PORT 1  VIEW  Result Date: 11/25/2021 CLINICAL DATA:  Shortness of breath and wheezing, initial encounter EXAM: PORTABLE CHEST 1 VIEW COMPARISON:  11/22/2021 FINDINGS: Cardiac shadow is stable. Pacing device is again noted. Lungs are well aerated bilaterally. Mild scarring is noted in the left base. No focal infiltrate or sizable effusion is noted. Aortic calcifications are seen. IMPRESSION: No acute abnormality noted. Electronically Signed   By: Alcide Clever M.D.   On: 11/25/2021 19:06       LOS: 2 days   Inioluwa Boulay Rito Ehrlich  [facility] Pager on [email or web address]  11/26/2021, 10:00 AM

## 2021-11-26 NOTE — Progress Notes (Signed)
Nutrition Brief Note  RD consulted for "nutritional goals."  Admitting Dx: Hypoxia [R09.02] Acute on chronic respiratory failure with hypoxia (HCC) [J96.21] Urinary tract infection without hematuria, site unspecified [N39.0] COPD with acute exacerbation (HCC) [J44.1] PMH:  Past Medical History:  Diagnosis Date   Acute metabolic encephalopathy [id number]   Adnexal cyst 07/09/2020   Anxiety    Artificial pacemaker    Bipolar disorder ([place])    COPD (chronic obstructive pulmonary disease) (HCC)    Depression    History of CT scan 2021   Legs.   Hypothyroidism 09/30/2020   Memory difficulty 01/02/2021   Sacral fracture ([place]) 07/09/2020   TIA (transient ischemic attack) 09/29/2020   UTI (urinary tract infection) 07/09/2020    Medications:   arformoterol  15 mcg Nebulization BID   budesonide (PULMICORT) nebulizer solution  0.5 mg Nebulization BID   buPROPion  75 mg Oral Daily   cephALEXin  500 mg Oral Q8H   clopidogrel  75 mg Oral Daily   cycloSPORINE  1 drop Both Eyes BID   docusate sodium  100 mg Oral BID   dorzolamide-timolol  1 drop Left Eye BID   doxycycline  100 mg Oral Q12H   enoxaparin (LOVENOX) injection  40 mg Subcutaneous Q24H   fluticasone  1 spray Each Nare Daily   guaiFENesin  600 mg Oral BID   ipratropium-albuterol  3 mL Nebulization Q6H   latanoprost  1 drop Both Eyes QHS   levothyroxine  68.5 mcg Oral QAC breakfast   loratadine  10 mg Oral Daily   methylPREDNISolone (SOLU-MEDROL) injection  60 mg Intravenous Q12H   mirtazapine  15 mg Oral QHS   pantoprazole  40 mg Oral Daily   polyethylene glycol  17 g Oral BID   polyvinyl alcohol  1 drop Both Eyes QID   QUEtiapine  25 mg Oral QHS    Labs: Recent Labs  Lab 11/22/21 2301 11/23/21 0107 11/24/21 0219 11/25/21 0234  NA 132* 136 128* 136  K 3.4* 3.6 4.4 4.0  CL 96*  --  93* 103  CO2 25  --  24 23  BUN 15  --  22 22  CREATININE 1.26*  --  1.08* 0.93  CALCIUM 9.4  --  9.8 8.8*  GLUCOSE 96  --  155*  99    Wt Readings from Last 15 Encounters:  11/22/21 64.9 kg  03/28/21 64.9 kg  01/11/21 65.3 kg  01/02/21 65.3 kg  12/28/20 65 kg  11/23/20 63.9 kg  09/29/20 61.2 kg  07/09/20 63.5 kg  05/19/20 63.5 kg    Body mass index is 25.35 kg/m.   Current diet order is regular, patient is consuming approximately 80-100% of meals at this time. Denies any issues or recent changes regarding appetite and weight.  No nutrition interventions warranted at this time. If nutrition issues arise, please consult RD.    Theone Stanley., MS, RD, LDN (she/her/hers) RD pager number and weekend/on-call pager number located in [place].

## 2021-11-26 NOTE — Evaluation (Addendum)
Occupational Therapy Evaluation Patient Details Name: Sheila Mccoy MRN: [id number] DOB: 1930/11/06 Today's Date: 11/26/2021   History of Present Illness 88 yo female with onset of SOB and hypoxia in her ALF was admitted 2/16, COPD exacerbation, UTI, hyponatremia.  Pt has PT referred, family involved in her care.  PMHx:  Pacemaker, COPD, chronic O2 use, hypothyroidism, dementia, glaucoma, anxiety, depression, TIA   Clinical Impression   Pt reports independence at baseline with ADLs, does not do IADLs and uses RW for mobility. Pt currently min A for bed mobility and min guard-min A for stand pivot transfer x2 during session. Pt min-mod A for ADLs during session. Pt reporting feeling SOB after transfer, SpO2 above 91% throughout session on 4L O2. Pt benefits from increased cuing for safety with RW during session. Pt presenting with impairments listed below, will follow. Recommend HHOT at d/c.     Recommendations for follow up therapy are one component of a multi-disciplinary discharge planning process, led by the attending physician.  Recommendations may be updated based on patient status, additional functional criteria and insurance authorization.   Follow Up Recommendations  Home health OT    Assistance Recommended at Discharge Intermittent Supervision/Assistance  Patient can return home with the following A little help with walking and/or transfers;A little help with bathing/dressing/bathroom;Assistance with cooking/housework;Help with stairs or ramp for entrance    Functional Status Assessment  Patient has had a recent decline in their functional status and demonstrates the ability to make significant improvements in function in a reasonable and predictable amount of time.  Equipment Recommendations  None recommended by OT;Other (comment) (pt has all needed DME)    Recommendations for Other Services       Precautions / Restrictions Precautions Precautions: Fall Precaution Comments:  monitor O2 sats Restrictions Weight Bearing Restrictions: No      Mobility Bed Mobility Overal bed mobility: Needs Assistance Bed Mobility: Supine to Sit     Supine to sit: Min assist     General bed mobility comments: for trunk elevation    Transfers Overall transfer level: Needs assistance Equipment used: Rolling walker (2 wheels) Transfers: Sit to/from Stand, Bed to chair/wheelchair/BSC Sit to Stand: Min assist, Min guard                  Balance Overall balance assessment: Needs assistance Sitting-balance support: Feet supported Sitting balance-Leahy Scale: Fair     Standing balance support: Bilateral upper extremity supported, During functional activity Standing balance-Leahy Scale: Poor                             ADL either performed or assessed with clinical judgement   ADL Overall ADL's : Needs assistance/impaired Eating/Feeding: Set up;Sitting   Grooming: Set up;Sitting   Upper Body Bathing: Sitting;Minimal assistance   Lower Body Bathing: Sitting/lateral leans;Minimal assistance   Upper Body Dressing : Sitting;Minimal assistance   Lower Body Dressing: Moderate assistance;Sitting/lateral leans   Toilet Transfer: BSC/3in1;Rolling walker (2 wheels);Stand-pivot;Min guard [employer] forces technical officer Details (indicate cue type and reason): x2 Toileting- Clothing Manipulation and Hygiene: Maximal assistance;Sit to/from stand Toileting - Clothing Manipulation Details (indicate cue type and reason): for pericare     Functional mobility during ADLs: Rolling walker (2 wheels);Minimal assistance       Vision   Vision Assessment?: No apparent visual deficits     Perception     Praxis      Pertinent Vitals/Pain Pain Assessment Pain Assessment: No/denies pain  Hand Dominance Right   Extremity/Trunk Assessment Upper Extremity Assessment Upper Extremity Assessment: Generalized weakness   Lower Extremity Assessment Lower Extremity  Assessment: Defer to PT evaluation   Cervical / Trunk Assessment Cervical / Trunk Assessment: Kyphotic   Communication Communication Communication: HOH   Cognition Arousal/Alertness: Awake/alert Behavior During Therapy: WFL for tasks assessed/performed Overall Cognitive Status: History of cognitive impairments - at baseline                                 General Comments: dementia per chart     General Comments  prefers to return to [facility]    Exercises     Shoulder Instructions      Home Living Family/patient expects to be discharged to:: Assisted living Living Arrangements: Other (Comment) (staff)                               Additional Comments: pt reports being on 3L at rest, 4L O2 at baseline, currently on 4L O2      Prior Functioning/Environment Prior Level of Function : Needs assist       Physical Assist : Mobility (physical) Mobility (physical): Gait   Mobility Comments: uses RW ADLs Comments: in ALF so did have access to assistance        OT Problem List: Decreased strength;Decreased range of motion;Decreased activity tolerance;Impaired balance (sitting and/or standing);Decreased knowledge of use of DME or AE;Cardiopulmonary status limiting activity      OT Treatment/Interventions: Self-care/ADL training;Therapeutic exercise;Therapeutic activities;Patient/family education;Balance training;Energy conservation;DME and/or AE instruction    OT Goals(Current goals can be found in the care plan section) Acute Rehab OT Goals Patient Stated Goal: to breathe better OT Goal Formulation: With patient Time For Goal Achievement: 12/10/21 Potential to Achieve Goals: Good ADL Goals Pt Will Perform Upper Body Dressing: with supervision;sitting Pt Will Perform Lower Body Dressing: with min assist;sit to/from stand Pt Will Transfer to Toilet: with supervision;ambulating;bedside commode;regular height toilet  OT Frequency: Min  2X/week    Co-evaluation              AM-PAC OT "6 Clicks" Daily Activity     Outcome Measure Help from another person eating meals?: None Help from another person taking care of personal grooming?: None Help from another person toileting, which includes using toliet, bedpan, or urinal?: A Little Help from another person bathing (including washing, rinsing, drying)?: A Lot Help from another person to put on and taking off regular upper body clothing?: A Little Help from another person to put on and taking off regular lower body clothing?: A Lot 6 Click Score: 18   End of Session Equipment Utilized During Treatment: Gait belt;Rolling walker (2 wheels) Nurse Communication: Mobility status  Activity Tolerance: Patient limited by fatigue Patient left: in chair;with call bell/phone within reach;with chair alarm set;with nursing/sitter in room  OT Visit Diagnosis: Unsteadiness on feet (R26.81);Other abnormalities of gait and mobility (R26.89);Muscle weakness (generalized) (M62.81)                Time: [id number] OT Time Calculation (min): 32 min Charges:  OT General Charges $OT Visit: 1 Visit OT Evaluation $OT Eval Moderate Complexity: 1 Mod OT Treatments $Self Care/Home Management : 8-22 mins  Lynnda Child, OTD, OTR/L Acute Rehab [phone]) [phone] - [place] 11/26/2021, 11:42 AM

## 2021-11-26 NOTE — Progress Notes (Signed)
Mobility Specialist Progress Note    11/26/21 1456  Mobility  Bed Position Chair  Activity Ambulated with assistance in hallway  Level of Assistance Contact guard assist, steadying assist  Assistive Device Front wheel walker  Distance Ambulated (ft) 120 ft (60+60)  Activity Response Tolerated fair  $Mobility charge 1 Mobility   Pt received in chair and agreeable. Ambulated on 4LO2. Took x1 seated rest break between bouts. Attached leads before second bout as pt was curious about her HR and HR maintained in 70s during second bout. Left in chair with call bell in reach and daughter present.   [facility] Mobility Specialist  M.S. 5N: [phone]

## 2021-11-26 NOTE — Evaluation (Signed)
Clinical/Bedside Swallow Evaluation Patient Details  Name: Sheila Mccoy MRN: [id number] Date of Birth: 13-Nov-1930  Today's Date: 11/26/2021 Time: SLP Start Time (ACUTE ONLY): [id number] SLP Stop Time (ACUTE ONLY): 1102 SLP Time Calculation (min) (ACUTE ONLY): 13 min  Past Medical History:  Past Medical History:  Diagnosis Date   Acute metabolic encephalopathy [id number]   Adnexal cyst 07/09/2020   Anxiety    Artificial pacemaker    Bipolar disorder ([place])    COPD (chronic obstructive pulmonary disease) ([place])    Depression    History of CT scan 2021   Legs.   Hypothyroidism 09/30/2020   Memory difficulty 01/02/2021   Sacral fracture ([place]) 07/09/2020   TIA (transient ischemic attack) 09/29/2020   UTI (urinary tract infection) 07/09/2020   Past Surgical History:  Past Surgical History:  Procedure Laterality Date   arm fracture surgery     MRI  2021   Lungs.   right shoulder      pins in right shoulder.   HPI:  Pt is an 88 yo female with onset of SOB and hypoxia in her ALF. She was admitted 2/16 with COPD exacerbation, UTI, hyponatremia. CXR x2 and CTA Chest negative for acute findings. PMHx:  Pacemaker, COPD, chronic O2 use, hypothyroidism, dementia, glaucoma, anxiety, depression, TIA, bipolar d/o    Assessment / Plan / Recommendation  Clinical Impression  Pt's oropharyngeal swallowing appears to be [facility] and she denies subjective complaints. She does endorse coughing episodes, but she does not necessarily have them only during PO intake. Would leave her on regular solids and thin liquids based on skilled observation today, but will f/u briefly in light of risk factors for dysphagia including h/o COPD and exacerbation. SLP Visit Diagnosis: Dysphagia, unspecified (R13.10)    Aspiration Risk  Mild aspiration risk    Diet Recommendation Regular;Thin liquid   Liquid Administration via: Cup;Straw Medication Administration: Whole meds with liquid Supervision: Patient able to self  feed;Intermittent supervision to cue for compensatory strategies Compensations: Slow rate;Small sips/bites Postural Changes: Seated upright at 90 degrees    Other  Recommendations Oral Care Recommendations: Oral care BID    Recommendations for follow up therapy are one component of a multi-disciplinary discharge planning process, led by the attending physician.  Recommendations may be updated based on patient status, additional functional criteria and insurance authorization.  Follow up Recommendations No SLP follow up      Assistance Recommended at Discharge Set up Supervision/Assistance  Functional Status Assessment Patient has not had a recent decline in their functional status  Frequency and Duration min 1 x/week  1 week       Prognosis Prognosis for Safe Diet Advancement: Good      Swallow Study   General HPI: Pt is an 88 yo female with onset of SOB and hypoxia in her ALF. She was admitted 2/16 with COPD exacerbation, UTI, hyponatremia. CXR x2 and CTA Chest negative for acute findings. PMHx:  Pacemaker, COPD, chronic O2 use, hypothyroidism, dementia, glaucoma, anxiety, depression, TIA, bipolar d/o Type of Study: Bedside Swallow Evaluation Previous Swallow Assessment: none in chart Diet Prior to this Study: Regular;Thin liquids Temperature Spikes Noted: No Respiratory Status: Nasal cannula History of Recent Intubation: No Behavior/Cognition: Alert;Cooperative;Pleasant mood Oral Cavity Assessment: Within Functional Limits Oral Care Completed by SLP: No Oral Cavity - Dentition: Dentures, top;Dentures, bottom Vision: Functional for self-feeding Self-Feeding Abilities: Able to feed self Patient Positioning: Upright in chair Baseline Vocal Quality: Normal Volitional Cough: Strong Volitional Swallow: Able to elicit  Oral/Motor/Sensory Function Overall Oral Motor/Sensory Function: Within functional limits   Ice Chips Ice chips: Not tested   Thin Liquid Thin Liquid: Within  functional limits Presentation: Self Fed;Straw    Nectar Thick Nectar Thick Liquid: Not tested   Honey Thick Honey Thick Liquid: Not tested   Puree Puree: Within functional limits Presentation: Spoon;Self Fed   Solid     Solid: Within functional limits Presentation: Self Fed      Osie Bond., M.A. [place] Pager [phone] Office [phone]  11/26/2021,1:27 PM

## 2021-11-27 LAB — BASIC METABOLIC PANEL
Anion gap: 8 (ref 5–15)
BUN: 34 mg/dL — ABNORMAL HIGH (ref 8–23)
CO2: 26 mmol/L (ref 22–32)
Calcium: 9.3 mg/dL (ref 8.9–10.3)
Chloride: 101 mmol/L (ref 98–111)
Creatinine, Ser: 1.08 mg/dL — ABNORMAL HIGH (ref 0.44–1.00)
GFR, Estimated: 49 mL/min — ABNORMAL LOW (ref >60)
Glucose, Bld: 95 mg/dL (ref 70–99)
Potassium: 4.4 mmol/L (ref 3.5–5.1)
Sodium: 135 mmol/L (ref 135–145)

## 2021-11-27 LAB — CBC
HCT: 41.2 % (ref 36.0–46.0)
Hemoglobin: 13.6 g/dL (ref 12.0–15.0)
MCH: 28.9 pg (ref 26.0–34.0)
MCHC: 33 g/dL (ref 30.0–36.0)
MCV: 87.5 fL (ref 80.0–100.0)
Platelets: 394 10*3/uL (ref 150–400)
RBC: 4.71 MIL/uL (ref 3.87–5.11)
RDW: 14.2 % (ref 11.5–15.5)
WBC: 17.9 10*3/uL — ABNORMAL HIGH (ref 4.0–10.5)
nRBC: 0 % (ref 0.0–0.2)

## 2021-11-27 MED ORDER — ALBUTEROL SULFATE HFA 108 (90 BASE) MCG/ACT IN AERS: 1.0000 | INHALATION_SPRAY | Freq: Four times a day (QID) | RESPIRATORY_TRACT | 1 refills | Status: DC | PRN

## 2021-11-27 MED ORDER — ALBUTEROL SULFATE (2.5 MG/3ML) 0.083% IN NEBU: 2.5000 mg | INHALATION_SOLUTION | RESPIRATORY_TRACT | 12 refills | Status: DC | PRN

## 2021-11-27 MED ORDER — CEPHALEXIN 500 MG PO CAPS: 500.0000 mg | ORAL_CAPSULE | Freq: Three times a day (TID) | ORAL | 0 refills | Status: DC

## 2021-11-27 MED ORDER — DOXYCYCLINE HYCLATE 100 MG PO TABS: 100.0000 mg | ORAL_TABLET | Freq: Two times a day (BID) | ORAL | 0 refills | Status: DC

## 2021-11-27 MED ORDER — PREDNISONE 20 MG PO TABS: ORAL_TABLET | ORAL | 0 refills | Status: DC

## 2021-11-27 MED ORDER — VITAMIN D3 25 MCG (1000 UT) PO CAPS: 1000.0000 [IU] | ORAL_CAPSULE | Freq: Two times a day (BID) | ORAL | Status: AC

## 2021-11-27 NOTE — Progress Notes (Signed)
PT Cancellation Note  Patient Details Name: Estefana Taylor MRN: [id number] DOB: [birth year]   Cancelled Treatment:    Reason Eval/Treat Not Completed: Other (comment).  Leaving shortly and on 4L O2.  SOB complaints but sats are at 99%.  Discharging home shortly.   Ivar Drape 11/27/2021, 2:44 PM  Samul Dada, PT PhD Acute Rehab Dept. Number: [facility] [id number] and [facility] [phone]

## 2021-11-27 NOTE — Progress Notes (Addendum)
Mobility Specialist Progress Note    11/27/21 1107  Mobility  Bed Position Chair  Activity Ambulated with assistance in hallway  Level of Assistance Minimal assist, patient does 75% or more  Assistive Device Front wheel walker  Distance Ambulated (ft) 60 ft  Activity Response Tolerated fair  $Mobility charge 1 Mobility   Pre-Mobility: 66 HR, 95% @ 5L SpO2 During Mobility: 83 HR  Pt received in bed and agreeable. Upon standing had BM. Took x1 seated rest break in chair. No complaints during ambulation. Returned to chair with alarm on and call bell in reach. Pt c/o feeling SOB and not being able to take a deep breath but SpO2 reading in high 90s, RN notified.   [facility] Mobility Specialist  M.S. 5N: [phone]

## 2021-11-27 NOTE — Progress Notes (Signed)
Speech Language Pathology Treatment: Dysphagia  Patient Details Name: Sheila Mccoy MRN: [id number] DOB: [birth year] Today's Date: 11/27/2021 Time: 3086-5784 SLP Time Calculation (min) (ACUTE ONLY): 10 min  Assessment / Plan / Recommendation Clinical Impression  Pt has been c/o difficulty breathing this morning. RN recently increased O2 and SpO2 maintaining at 95-97% but pt still reporting difficulty. She consumed only water during SLP visit, but denied any difficulty during breakfast meal with almost her entire tray cleared in front of her. She did have occasional coughing while SLP was in the room, but it did not appear to be directly correlated with swallowing as it was noted before intake and with a bit of a delay after. Although she is at increased risk to have difficulty coordinating breathing/swallowing, she did consistently exhale post-swallow, consistent with normal patterns. Would continue current diet but provided education about respiratory precautions. Pt will need reinforcement so SLP will continue to follow acutely as she remains inpatient.    HPI HPI: Pt is an 88 yo female with onset of SOB and hypoxia in her ALF. She was admitted 2/16 with COPD exacerbation, UTI, hyponatremia. CXR x2 and CTA Chest negative for acute findings. PMHx:  Pacemaker, COPD, chronic O2 use, hypothyroidism, dementia, glaucoma, anxiety, depression, TIA, bipolar d/o      SLP Plan  Continue with current plan of care      Recommendations for follow up therapy are one component of a multi-disciplinary discharge planning process, led by the attending physician.  Recommendations may be updated based on patient status, additional functional criteria and insurance authorization.    Recommendations  Diet recommendations: Regular;Thin liquid Liquids provided via: Cup;Straw Medication Administration: Whole meds with puree Supervision: Patient able to self feed;Intermittent supervision to cue for compensatory  strategies Compensations: Slow rate;Small sips/bites;Other (Comment) (take breaks PRN for breathing) Postural Changes and/or Swallow Maneuvers: Seated upright 90 degrees                Oral Care Recommendations: Oral care BID Follow Up Recommendations: No SLP follow up Assistance recommended at discharge: Set up Supervision/Assistance SLP Visit Diagnosis: Dysphagia, unspecified (R13.10) Plan: Continue with current plan of care           Sheila Mccoy., M.A. CCC-SLP Acute Rehabilitation Services Pager [phone] Office [phone]  11/27/2021, 9:27 AM

## 2021-11-27 NOTE — TOC Transition Note (Signed)
Transition of Care [facility]) - CM/SW Discharge Note   Patient Details  Name: Sheila Mccoy MRN: [id number] Date of Birth: 05/27/1931  Transition of Care [facility]) CM/SW Contact:  Joanne Chars, LCSW Phone Number: 11/27/2021, 10:59 AM   Clinical Narrative: Pt will DC back to [facility] ALF/[address] [place].  Daughter will transport.  PT/OT to be provided by [facility].  Nebulizer ordered from [employer].  No other needs identified.        Final next level of care: Assisted Living Barriers to Discharge: No Barriers Identified   Patient Goals and CMS Choice Patient states their goals for this hospitalization and ongoing recovery are:: breath better and get home CMS Medicare.gov Compare Post Acute Care list provided to::  (NA-pt will return to [facility] ALF/[address])    Discharge Placement                Patient to be transferred to facility by: daughter Name of family member notified: daughter [name] Patient and family notified of of transfer: 11/27/21  Discharge Plan and Services In-house Referral: Clinical Social Work   Post Acute Care Choice: Home Health          DME Arranged: Nebulizer/meds DME Agency: [employer] Date DME Agency Contacted: 11/27/21 Time DME Agency Contacted: 40 Representative spoke with at DME Agency: [place] Determinants of Health ([place]) Interventions     Readmission Risk Interventions No flowsheet data found.

## 2021-11-27 NOTE — NC FL2 (Signed)
[place] MEDICAID FL2 LEVEL OF CARE SCREENING TOOL     IDENTIFICATION  Patient Name: Sheila Mccoy Birthdate: 08-Jan-1931 Sex: female Admission Date (Current Location): 11/22/2021  [facility] and [place] Number:  Producer, television/film/video and Address:  The . [facility], [address], [place], [place] [address]      Provider Number: [id number]  Attending Physician Name and Address:  Osvaldo Shipper, MD  Relative Name and Phone Number:  Mertha Finders Daughter   [phone]    Current Level of Care: Hospital Recommended Level of Care: Assisted Living Facility Prior Approval Number:    Date Approved/Denied:   PASRR Number:    Discharge Plan: Other (Comment) (ALF)    Current Diagnoses: Patient Active Problem List   Diagnosis Date Noted   Hyponatremia 11/24/2021   Acute on chronic respiratory failure with hypoxia (HCC) 11/23/2021   COPD with acute exacerbation (HCC) 11/23/2021   Glaucoma (increased eye pressure) 11/23/2021   DNR (do not resuscitate) 11/23/2021   Muscle weakness (generalized) 03/28/2021   Chronic respiratory failure with hypoxia (HCC) 01/12/2021   Chronic diastolic CHF (congestive heart failure) (HCC) 01/12/2021   GERD without esophagitis 01/12/2021   Traumatic closed fracture of one rib of right side with minimal displacement, initial encounter 01/11/2021   Memory difficulty 01/02/2021   Hypothyroidism 09/30/2020   TIA (transient ischemic attack) 09/29/2020   Anxiety and depression 07/12/2020   Acute lower UTI 07/09/2020   Adnexal cyst 07/09/2020    Orientation RESPIRATION BLADDER Height & Weight     Self, Time, Situation, Place  O2 Incontinent Weight: 143 lb 1.3 oz (64.9 kg) Height:  5\' 3"  (160 cm)  BEHAVIORAL SYMPTOMS/MOOD NEUROLOGICAL BOWEL NUTRITION STATUS      Continent Diet (see discharge summary)  AMBULATORY STATUS COMMUNICATION OF NEEDS Skin   Supervision Verbally Other (Comment) (dry)                        Personal Care Assistance Level of Assistance  Bathing, Feeding, Dressing Bathing Assistance: Limited assistance Feeding assistance: Limited assistance Dressing Assistance: Limited assistance     Functional Limitations Info  Sight, Hearing, Speech Sight Info: Adequate Hearing Info: Adequate Speech Info: Adequate    SPECIAL CARE FACTORS FREQUENCY  PT (By licensed PT), OT (By licensed OT)     PT Frequency: 2-3x week OT Frequency: 2-3x week            Contractures Contractures Info: Not present    Additional Factors Info  Code Status, Allergies Code Status Info: DNR Allergies Info: Crestor (Rosuvastatin), Hydromorphone, Lipitor (Atorvastatin), Macrobid (Nitrofurantoin), Morphine And Related, Tramadol           Current Medications (11/27/2021):  This is the current hospital active medication list Current Facility-Administered Medications  Medication Dose Route Frequency Provider Last Rate Last Admin   acetaminophen (TYLENOL) tablet 650 mg  650 mg Oral Q6H PRN 11/29/2021, MD   650 mg at 11/26/21 11/28/21   Or   acetaminophen (TYLENOL) suppository 650 mg  650 mg Rectal Q6H PRN 0630, MD       albuterol (PROVENTIL) (2.5 MG/3ML) 0.083% nebulizer solution 2.5 mg  2.5 mg Nebulization Q4H PRN Jonah Blue, MD   2.5 mg at 11/25/21 1714   arformoterol (BROVANA) nebulizer solution 15 mcg  15 mcg Nebulization BID 11/27/21, MD   15 mcg at 11/27/21 0841   bisacodyl (DULCOLAX) EC tablet 5 mg  5 mg Oral Daily PRN 11/29/21, MD  budesonide (PULMICORT) nebulizer solution 0.5 mg  0.5 mg Nebulization BID Osvaldo Shipper, MD   0.5 mg at 11/27/21 0841   buPROPion [facility]) tablet 75 mg  75 mg Oral Daily Jonah Blue, MD   75 mg at 11/26/21 0945   cephALEXin (KEFLEX) capsule 500 mg  500 mg Oral Q8H Osvaldo Shipper, MD   500 mg at 11/27/21 0510   clopidogrel (PLAVIX) tablet 75 mg  75 mg Oral Daily Jonah Blue, MD   75 mg at 11/26/21 7824    cycloSPORINE (RESTASIS) 0.05 % ophthalmic emulsion 1 drop  1 drop Both Eyes BID Jonah Blue, MD   1 drop at 11/26/21 2143   docusate sodium (COLACE) capsule 100 mg  100 mg Oral BID Jonah Blue, MD   100 mg at 11/26/21 2142   dorzolamide-timolol (COSOPT) 22.3-6.8 MG/ML ophthalmic solution 1 drop  1 drop Left Eye BID Jonah Blue, MD   1 drop at 11/26/21 2144   doxycycline (VIBRA-TABS) tablet 100 mg  100 mg Oral Q12H Osvaldo Shipper, MD   100 mg at 11/26/21 2144   enoxaparin (LOVENOX) injection 40 mg  40 mg Subcutaneous Q24H Paytes, Austin A, RPH   40 mg at 11/26/21 2144   fluticasone (FLONASE) 50 MCG/ACT nasal spray 1 spray  1 spray Each Nare Daily Jonah Blue, MD   1 spray at 11/26/21 0941   guaiFENesin (MUCINEX) 12 hr tablet 600 mg  600 mg Oral BID Jonah Blue, MD   600 mg at 11/26/21 2141   hydrALAZINE (APRESOLINE) injection 5 mg  5 mg Intravenous Q4H PRN Jonah Blue, MD       ipratropium-albuterol (DUONEB) 0.5-2.5 (3) MG/3ML nebulizer solution 3 mL  3 mL Nebulization Q6H Jonah Blue, MD   3 mL at 11/27/21 0841   latanoprost (XALATAN) 0.005 % ophthalmic solution 1 drop  1 drop Both Eyes QHS Jonah Blue, MD   1 drop at 11/26/21 2200   levothyroxine (SYNTHROID) tablet 68.5 mcg  68.5 mcg Oral QAC breakfast Jonah Blue, MD   68.5 mcg at 11/27/21 0510   loratadine (CLARITIN) tablet 10 mg  10 mg Oral Daily Jonah Blue, MD   10 mg at 11/26/21 2353   methylPREDNISolone sodium succinate (SOLU-MEDROL) 125 mg/2 mL injection 60 mg  60 mg Intravenous Q12H Osvaldo Shipper, MD   60 mg at 11/27/21 0510   mirtazapine (REMERON) tablet 15 mg  15 mg Oral Noemi Chapel, MD   15 mg at 11/26/21 2141   ondansetron [facility]) tablet 4 mg  4 mg Oral Q6H PRN Jonah Blue, MD       Or   ondansetron [facility]) injection 4 mg  4 mg Intravenous Q6H PRN Jonah Blue, MD       oxyCODONE (Oxy IR/ROXICODONE) immediate release tablet 5 mg  5 mg Oral Q4H PRN Jonah Blue, MD        pantoprazole (PROTONIX) EC tablet 40 mg  40 mg Oral Daily Jonah Blue, MD   40 mg at 11/26/21 0944   polyethylene glycol (MIRALAX / GLYCOLAX) packet 17 g  17 g Oral BID Osvaldo Shipper, MD   17 g at 11/26/21 2143   polyvinyl alcohol (LIQUIFILM TEARS) 1.4 % ophthalmic solution 1 drop  1 drop Both Eyes QID Jonah Blue, MD   1 drop at 11/26/21 2144   QUEtiapine (SEROQUEL) tablet 25 mg  25 mg Oral Noemi Chapel, MD   25 mg at 11/26/21 2142   zolpidem (AMBIEN) tablet 5 mg  5 mg Oral  QHS PRN Jonah Blue, MD         Discharge Medications: Please see discharge summary for a list of discharge medications.  Relevant Imaging Results:  Relevant Lab Results:   Additional Information SSN: [id number]  Lorri Frederick, LCSW

## 2021-11-27 NOTE — TOC Initial Note (Addendum)
Transition of Care [facility]) - Initial/Assessment Note    Patient Details  Name: Sheila Mccoy MRN: [id number] Date of Birth: 02-03-1931  Transition of Care [facility]) CM/SW Contact:    Joanne Chars, LCSW Phone Number: 11/27/2021, 10:53 AM  Clinical Narrative:  CSW met with pt regarding recommendation for [facility].  Pt is from [employer] [facility] [address], in assisted living.  Pt would like to return to [facility] and get her PT there.  Permission given to speak to daughters [name] and Barista.  CSW spoke with [name], who confirms the above plan.  She will be coming to the hospital to transport pt back to [place] after lunch.  She said her mother had called this AM stating she couldn't breath well, was asking for update on this (MD informed and called her)  She also asked about nebulizer for home/ALF.  CSw spoke with Wyatt Portela at [place] who requested DC summary, FL2, [place] orders be faxed to [phone], which was done.  Scripts need to go to [employer].  Nebulizer ordered from [employer].               1310: CSW spoke with Biana at [place], she did receive the fax and they are all set to receive pt back.    Expected Discharge Plan: Assisted Living Barriers to Discharge: No Barriers Identified   Patient Goals and CMS Choice Patient states their goals for this hospitalization and ongoing recovery are:: breath better and get home CMS Medicare.gov Compare Post Acute Care list provided to::  (NA-pt will return to [facility] ALF/[address])    Expected Discharge Plan and Services Expected Discharge Plan: Assisted Living In-house Referral: Clinical Social Work   Post Acute Care Choice: [place] arrangements for the past 2 months: [place] Expected Discharge Date: 11/27/21               DME Arranged: Nebulizer/meds DME Agency: [employer] Date DME Agency Contacted: 11/27/21 Time DME Agency Contacted: 45 Representative spoke with at DME Agency: Freda Munro             Prior Living Arrangements/Services Living arrangements for the past 2 months: [place] Lives with:: Facility Resident   Do you feel safe going back to the place where you live?: Yes      Need for Family Participation in Patient Care: Yes (Comment) Care giver support system in place?: Yes (comment) Current home services: Other (comment) (na) Criminal Activity/Legal Involvement Pertinent to Current Situation/Hospitalization: No - Comment as needed  Activities of Daily Living Home Assistive Devices/Equipment: Walker (specify type) ADL Screening (condition at time of admission) Patient's cognitive ability adequate to safely complete daily activities?: No Is the patient deaf or have difficulty hearing?: No Does the patient have difficulty seeing, even when wearing glasses/contacts?: Yes Does the patient have difficulty concentrating, remembering, or making decisions?: No Patient able to express need for assistance with ADLs?: Yes Does the patient have difficulty dressing or bathing?: Yes Independently performs ADLs?: Yes (appropriate for developmental age) Does the patient have difficulty walking or climbing stairs?: Yes Weakness of Legs: None Weakness of Arms/Hands: None  Permission Sought/Granted Permission sought to share information with : Family Supports Permission granted to share information with : Yes, Verbal Permission Granted  Share Information with NAME: daughters Diedre and Sales executive granted to share info w AGENCY: [facility]        Emotional Assessment Appearance:: Appears stated age Attitude/Demeanor/Rapport: Engaged Affect (typically observed): Appropriate, Pleasant Orientation: : Oriented to  Self, Oriented to Place, Oriented to  Time, Oriented to Situation Alcohol / Substance Use: Not Applicable Psych Involvement: No (comment)  Admission diagnosis:  Hypoxia [R09.02] Acute on chronic respiratory failure with hypoxia (HCC)  [J96.21] Urinary tract infection without hematuria, site unspecified [N39.0] COPD with acute exacerbation (HCC) [J44.1] Patient Active Problem List   Diagnosis Date Noted   Hyponatremia 11/24/2021   Acute on chronic respiratory failure with hypoxia ([place]) 11/23/2021   COPD with acute exacerbation ([place]) 11/23/2021   Glaucoma (increased eye pressure) 11/23/2021   DNR (do not resuscitate) 11/23/2021   Muscle weakness (generalized) 03/28/2021   Chronic respiratory failure with hypoxia (HCC) 01/12/2021   Chronic diastolic CHF (congestive heart failure) ([place]) 01/12/2021   GERD without esophagitis 01/12/2021   Traumatic closed fracture of one rib of right side with minimal displacement, initial encounter 01/11/2021   Memory difficulty 01/02/2021   Hypothyroidism 09/30/2020   TIA (transient ischemic attack) 09/29/2020   Anxiety and depression 07/12/2020   Acute lower UTI 07/09/2020   Adnexal cyst 07/09/2020   PCP:  Pcp, No Pharmacy:   [facility] [address] [place] - [address] [place] [place] [address] Phone: [phone] Fax: [place], [place] [address] Phone: [phone] Fax: [phone]     Social Determinants of Health (SDOH) Interventions    Readmission Risk Interventions No flowsheet data found.

## 2021-11-27 NOTE — Care Management Important Message (Signed)
Important Message  Patient Details  Name: Sheila Mccoy MRN: [id number] Date of Birth: 07/23/1931   Medicare Important Message Given:  Yes     Sherilyn Banker 11/27/2021, 12:09 PM

## 2021-11-27 NOTE — Discharge Summary (Signed)
[facility]  Physician Discharge Summary   Patient ID: Sheila Mccoy MRN: [id number] DOB/AGE: 86/11/1930 86 y.o.  Admit date: 11/22/2021 Discharge date:   11/27/2021   PCP: Pcp, No  DISCHARGE DIAGNOSES:  Principal Problem:   Acute on chronic respiratory failure with hypoxia (HCC) Active Problems:   COPD with acute exacerbation (HCC)   Acute lower UTI   DNR (do not resuscitate)   Hyponatremia   Glaucoma (increased eye pressure)   Memory difficulty   TIA (transient ischemic attack)   Anxiety and depression   Hypothyroidism   RECOMMENDATIONS FOR OUTPATIENT FOLLOW UP: Please check a basic metabolic panel in 1 week Follow-up with PCP in 1 week   Home Health: PT OT RN Equipment/Devices: None  CODE STATUS: DNR  DISCHARGE CONDITION: fair  Diet recommendation: Regular as tolerated  INITIAL HISTORY: 88 y.o. female with medical history significant of bipolar b/o; pacemaker; COPD on 4L home O2; hypothyroidism;  and dementia presented from her assisted living facility with shortness of breath.  She was noted to be hypoxic more than usual.  She is on 3 to 4 L of oxygen chronically.  Thought to have COPD exacerbation.  She was hospitalized for further management.  Also found to have UTI.   Patient was given nebulizer treatments and antibiotics.  She was also given steroids.  She was slow to improve but seems to be close to baseline.    HOSPITAL COURSE:    * Acute on chronic respiratory failure with hypoxia (HCC)- (present on admission) Patient uses 3 to 4 L of oxygen at baseline.  Was more hypoxic than usual in the mid 80s despite being on her usual home regimen.  Thought to be secondary to COPD exacerbation.  Initially was requiring 10 L of oxygen.   After treatment for COPD exacerbation she is now down to 3 to 4 L of oxygen by nasal cannula.    COPD with acute exacerbation (HCC)- (present on admission) Patient with oxygen dependent COPD.  Here with COPD exacerbation.   Chest x-ray did not show any pneumonia.   Patient was started on steroids antibiotics and will nebulizer treatments. Had a setback 2 days ago.  She was continued on intravenous steroids.  She was given nebulized budesonide and Brovana.  Has improved in the last 36 hours.  Will be discharged on steroid taper and antibiotics.  Acute lower UTI- (present on admission) Apparently had significant urinary symptoms with bladder spasm.  UTI suspected even though UA was not significantly abnormal.   Was initially treated with ceftriaxone.  Unfortunately urine culture not sent.  Changed over to Keflex.  Hyponatremia- (present on admission) Old labs reviewed.  She has had some degree of chronic hyponatremia.  It was felt that she had some hypovolemia.  She was given gentle IV hydration.  Sodium level has improved.  IV fluids were discontinued.    Glaucoma (increased eye pressure)- (present on admission) Continue Cosopt, latanoprost  Memory difficulty- (present on admission) Patient here from assisted living facility.  Continue home medications including Wellbutrin and Remeron and Seroquel.  Delirium precautions.    TIA (transient ischemic attack)- (present on admission) Continue Plavix.  No focal neurological deficits noted currently.  Anxiety and depression- (present on admission) Continue home medications.  Hypothyroidism- (present on admission) Continue levothyroxine.    Patient is stable.  Okay for discharge back to ALF today.   PERTINENT LABS:  The results of significant diagnostics from this hospitalization (including imaging, microbiology, ancillary and laboratory) are listed  below for reference.    Microbiology: Recent Results (from the past 240 hour(s))  Resp Panel by RT-PCR (Flu A&B, Covid) Nasopharyngeal Swab     Status: None   Collection Time: 11/23/21  8:40 AM   Specimen: Nasopharyngeal Swab; Nasopharyngeal(NP) swabs in vial transport medium  Result Value Ref Range Status    SARS Coronavirus 2 by RT PCR NEGATIVE NEGATIVE Final    Comment: (NOTE) SARS-CoV-2 target nucleic acids are NOT DETECTED.  The SARS-CoV-2 RNA is generally detectable in upper respiratory specimens during the acute phase of infection. The lowest concentration of SARS-CoV-2 viral copies this assay can detect is 138 copies/mL. A negative result does not preclude SARS-Cov-2 infection and should not be used as the sole basis for treatment or other patient management decisions. A negative result may occur with  improper specimen collection/handling, submission of specimen other than nasopharyngeal swab, presence of viral mutation(s) within the areas targeted by this assay, and inadequate number of viral copies(<138 copies/mL). A negative result must be combined with clinical observations, patient history, and epidemiological information. The expected result is Negative.  Fact Sheet for Patients:  BloggerCourse.com  Fact Sheet for Healthcare Providers:  SeriousBroker.it  This test is no t yet approved or cleared by the [place] FDA and  has been authorized for detection and/or diagnosis of SARS-CoV-2 by FDA under an Emergency Use Authorization (EUA). This EUA will remain  in effect (meaning this test can be used) for the duration of the COVID-19 declaration under Section 564(b)(1) of the Act, 21 U.S.C.section 360bbb-3(b)(1), unless the authorization is terminated  or revoked sooner.       Influenza A by PCR NEGATIVE NEGATIVE Final   Influenza B by PCR NEGATIVE NEGATIVE Final    Comment: (NOTE) The Xpert Xpress SARS-CoV-2/FLU/RSV plus assay is intended as an aid in the diagnosis of influenza from Nasopharyngeal swab specimens and should not be used as a sole basis for treatment. Nasal washings and aspirates are unacceptable for Xpert Xpress SARS-CoV-2/FLU/RSV testing.  Fact Sheet for  Patients: BloggerCourse.com  Fact Sheet for Healthcare Providers: SeriousBroker.it  This test is not yet approved or cleared by the [place] FDA and has been authorized for detection and/or diagnosis of SARS-CoV-2 by FDA under an Emergency Use Authorization (EUA). This EUA will remain in effect (meaning this test can be used) for the duration of the COVID-19 declaration under Section 564(b)(1) of the Act, 21 U.S.C. section 360bbb-3(b)(1), unless the authorization is terminated or revoked.  Performed at [facility], [address] [place], [place] [address]      Labs:  COVID-19 Labs   Lab Results  Component Value Date   SARSCOV2NAA NEGATIVE 11/23/2021   SARSCOV2NAA NEGATIVE 01/18/2021   SARSCOV2NAA NEGATIVE 01/15/2021   SARSCOV2NAA NEGATIVE 01/11/2021      Basic Metabolic Panel: Recent Labs  Lab 11/22/21 2301 11/23/21 0107 11/24/21 0219 11/25/21 0234 11/27/21 0356  NA 132* 136 128* 136 135  K 3.4* 3.6 4.4 4.0 4.4  CL 96*  --  93* 103 101  CO2 25  --  24 23 26   GLUCOSE 96  --  155* 99 95  BUN 15  --  22 22 34*  CREATININE 1.26*  --  1.08* 0.93 1.08*  CALCIUM 9.4  --  9.8 8.8* 9.3    CBC: Recent Labs  Lab 11/22/21 2301 11/23/21 0107 11/24/21 0219 11/25/21 0234 11/27/21 0356  WBC 9.5  --  14.9* 17.4* 17.9*  NEUTROABS 5.6  --   --   --   --  HGB 14.3 15.3* 14.0 13.4 13.6  HCT 44.1 45.0 40.7 40.5 41.2  MCV 88.2  --  84.4 87.3 87.5  PLT 371  --  371 360 394    BNP: BNP (last 3 results) Recent Labs    01/11/21 2142 11/22/21 2301  BNP 122.9* 90.9     CBG: Recent Labs  Lab 11/26/21 1138  GLUCAP 193*     IMAGING STUDIES CT Angio Chest PE W and/or Wo Contrast  Result Date: 11/23/2021 CLINICAL DATA:  Chest pain and shortness of breath. EXAM: CT ANGIOGRAPHY CHEST WITH CONTRAST TECHNIQUE: Multidetector CT imaging of the chest was performed using the standard protocol during  bolus administration of intravenous contrast. Multiplanar CT image reconstructions and MIPs were obtained to evaluate the vascular anatomy. RADIATION DOSE REDUCTION: This exam was performed according to the departmental dose-optimization program which includes automated exposure control, adjustment of the mA and/or kV according to patient size and/or use of iterative reconstruction technique. CONTRAST:  60mL OMNIPAQUE IOHEXOL 350 MG/ML SOLN COMPARISON:  Chest radiograph dated 11/22/2021 and CT dated 03/22/2021. FINDINGS: Evaluation of this exam is limited due to respiratory motion artifact. Cardiovascular: There is no cardiomegaly or pericardial effusion. There is coronary vascular calcification. Left pectoral pacemaker device. Mild atherosclerotic calcification of the thoracic aorta. Top-normal ascending aorta measure up to 4 cm similar to prior CT. No dissection. Evaluation of the pulmonary arteries is limited due to respiratory motion artifact. No central pulmonary artery embolus identified. Mediastinum/Nodes: Mildly enlarged right hilar lymph node measures 12 mm, likely reactive. There is a small hiatal hernia. The esophagus is grossly unremarkable. No mediastinal fluid collection. Lungs/Pleura: Bibasilar atelectasis. There is background of emphysema and diffuse chronic interstitial coarsening. No consolidative changes, pleural effusion, or pneumothorax. The central airways are patent. Upper Abdomen: No acute abnormality. Musculoskeletal: Degenerative changes of the spine. Osteopenia. Old-appearing right rib fractures. Right shoulder fixation. No acute osseous pathology. Review of the MIP images confirms the above findings. IMPRESSION: 1. No acute intrathoracic pathology. No large or central pulmonary artery embolus. 2. Aortic Atherosclerosis (ICD10-I70.0) and Emphysema (ICD10-J43.9). Electronically Signed   By: Elgie CollardArash  Radparvar M.D.   On: 11/23/2021 02:04   DG CHEST PORT 1 VIEW  Result Date:  11/25/2021 CLINICAL DATA:  Shortness of breath and wheezing, initial encounter EXAM: PORTABLE CHEST 1 VIEW COMPARISON:  11/22/2021 FINDINGS: Cardiac shadow is stable. Pacing device is again noted. Lungs are well aerated bilaterally. Mild scarring is noted in the left base. No focal infiltrate or sizable effusion is noted. Aortic calcifications are seen. IMPRESSION: No acute abnormality noted. Electronically Signed   By: Alcide CleverMark  Lukens M.D.   On: 11/25/2021 19:06   DG Chest Portable 1 View  Result Date: 11/22/2021 CLINICAL DATA:  Short of breath, nonproductive cough, hypoxia EXAM: PORTABLE CHEST 1 VIEW COMPARISON:  03/22/2021 FINDINGS: Single frontal view of the chest demonstrates dual lead pacer overlying left chest. The cardiac silhouette is unremarkable. No acute airspace disease, effusion, or pneumothorax. Stable basilar predominant scarring and fibrosis. Stable hilar prominence consistent with vascular shadow and adenopathy. No acute bony abnormalities. IMPRESSION: 1. Stable basilar predominant scarring and fibrosis. No acute airspace disease. Electronically Signed   By: Sharlet SalinaMichael  Brown M.D.   On: 11/22/2021 23:18    DISCHARGE EXAMINATION: Vitals:   11/26/21 2100 11/27/21 0225 11/27/21 0500 11/27/21 0842  BP: (!) 141/59  (!) 154/74   Pulse: 61     Resp:   20 20  Temp: 98.2 F (36.8 C)  98.1 F (36.7 C)  TempSrc: Oral  Oral   SpO2: 97% 95% 97%   Weight:      Height:       General appearance: Awake alert.  In no distress Resp: Improved effort.  No wheezing heard this morning.  No crackles.  No rhonchi. Cardio: S1-S2 is normal regular.  No S3-S4.  No rubs murmurs or bruit GI: Abdomen is soft.  Nontender nondistended.  Bowel sounds are present normal.  No masses organomegaly    DISPOSITION: Back to ALF  Discharge Instructions     Call MD for:  difficulty breathing, headache or visual disturbances   Complete by: As directed    Call MD for:  extreme fatigue   Complete by: As directed     Call MD for:  persistant dizziness or light-headedness   Complete by: As directed    Call MD for:  persistant nausea and vomiting   Complete by: As directed    Call MD for:  severe uncontrolled pain   Complete by: As directed    Call MD for:  temperature >100.4   Complete by: As directed    Diet general   Complete by: As directed    Discharge instructions   Complete by: As directed    Please review instructions on the discharge summary.  You were cared for by a hospitalist during your hospital stay. If you have any questions about your discharge medications or the care you received while you were in the hospital after you are discharged, you can call the unit and asked to speak with the hospitalist on call if the hospitalist that took care of you is not available. Once you are discharged, your primary care physician will handle any further medical issues. Please note that NO REFILLS for any discharge medications will be authorized once you are discharged, as it is imperative that you return to your primary care physician (or establish a relationship with a primary care physician if you do not have one) for your aftercare needs so that they can reassess your need for medications and monitor your lab values. If you do not have a primary care physician, you can call [phone] for a physician referral.   Increase activity slowly   Complete by: As directed          Allergies as of 11/27/2021       Reactions   Crestor [rosuvastatin] Other (See Comments)   Lower extremity weakness, gait instability and confusion   Hydromorphone Other (See Comments)   Very Disoriented & Confused   Lipitor [atorvastatin] Other (See Comments)   Muscle paralysis.   Macrobid [nitrofurantoin] Other (See Comments)   tired   Morphine And Related Other (See Comments)   Unknown   Tramadol Other (See Comments)   Causes confusion        Medication List     STOP taking these medications     budesonide-formoterol 80-4.5 MCG/ACT inhaler Commonly known as: SYMBICORT       TAKE these medications    acetaminophen 500 MG tablet Commonly known as: TYLENOL Take 1,000 mg by mouth every 6 (six) hours as needed for mild pain.   albuterol (2.5 MG/3ML) 0.083% nebulizer solution Commonly known as: PROVENTIL Take 3 mLs (2.5 mg total) by nebulization every 4 (four) hours as needed for shortness of breath or wheezing. What changed: You were already taking a medication with the same name, and this prescription was added. Make sure you understand how and when to take each.  albuterol 108 (90 Base) MCG/ACT inhaler Commonly known as: VENTOLIN HFA Inhale 1-2 puffs into the lungs every 6 (six) hours as needed for wheezing or shortness of breath. What changed: Another medication with the same name was added. Make sure you understand how and when to take each.   Biofreeze 4 % Gel Generic drug: Menthol (Topical Analgesic) Apply 1 application topically every 12 (twelve) hours as needed (pain). Apply to Right Shoulder   Breo Ellipta 100-25 MCG/ACT Aepb Generic drug: fluticasone furoate-vilanterol Inhale 1 puff into the lungs daily.   buPROPion 75 MG tablet Commonly known as: WELLBUTRIN Take 75 mg by mouth daily.   cephALEXin 500 MG capsule Commonly known as: KEFLEX Take 1 capsule (500 mg total) by mouth every 8 (eight) hours for 4 days.   clopidogrel 75 MG tablet Commonly known as: PLAVIX Take 1 tablet (75 mg total) by mouth daily.   CO Q 10 PO Take 120 mg by mouth 2 (two) times daily.   cyanocobalamin 1000 MCG tablet Take 1,000 mcg by mouth daily.   cyclobenzaprine 10 MG tablet Commonly known as: FLEXERIL Take 1 tablet (10 mg total) by mouth 3 (three) times daily. Use with caution due to fall risk   diclofenac Sodium 1 % Gel Commonly known as: VOLTAREN Apply 2 g topically 4 (four) times daily as needed (pain).   dorzolamide-timolol 22.3-6.8 MG/ML ophthalmic  solution Commonly known as: COSOPT Place 1 drop into the left eye 2 (two) times daily.   doxycycline 100 MG tablet Commonly known as: VIBRA-TABS Take 1 tablet (100 mg total) by mouth every 12 (twelve) hours for 4 days.   famotidine 10 MG tablet Commonly known as: PEPCID Take 10 mg by mouth every 12 (twelve) hours as needed for heartburn.   fluticasone 50 MCG/ACT nasal spray Commonly known as: FLONASE Place 1 spray into both nostrils daily.   furosemide 20 MG tablet Commonly known as: LASIX Take 20 mg by mouth every other day.   guaiFENesin 600 MG 12 hr tablet Commonly known as: Mucinex Take 1 tablet (600 mg total) by mouth 2 (two) times daily.   latanoprost 0.005 % ophthalmic solution Commonly known as: XALATAN Place 1 drop into both eyes at bedtime.   levothyroxine 137 MCG tablet Commonly known as: SYNTHROID Take 68.5 mcg by mouth daily before breakfast. empty stomach 1 hour before a meal with glass of water.   loperamide 2 MG tablet Commonly known as: IMODIUM A-D Take 2 mg by mouth every 8 (eight) hours as needed for diarrhea or loose stools.   loratadine 10 MG tablet Commonly known as: CLARITIN Take 1 tablet (10 mg total) by mouth daily.   magnesium oxide 400 MG tablet Commonly known as: MAG-OX Take 400 mg by mouth daily.   mirtazapine 15 MG tablet Commonly known as: REMERON Take 1 tablet (15 mg total) by mouth at bedtime.   pantoprazole 40 MG tablet Commonly known as: PROTONIX Take 40 mg by mouth daily.   polyethylene glycol 17 g packet Commonly known as: MIRALAX / GLYCOLAX Take 17 g by mouth 2 (two) times daily as needed for mild constipation.   predniSONE 20 MG tablet Commonly known as: DELTASONE Take 3 tablets once daily for 3 days followed by 2 tablets once daily for 3 days followed by 1 tablet once daily for 3 days and then stop   QUEtiapine 25 MG tablet Commonly known as: SEROquel Take 1 tablet (25 mg total) by mouth at bedtime.   Restasis  0.05 %  ophthalmic emulsion Generic drug: cycloSPORINE Place 1 drop into both eyes in the morning and at bedtime.   Rhopressa 0.02 % Soln Generic drug: Netarsudil Dimesylate Apply 1 drop to eye at bedtime.   Systane 0.4-0.3 % Soln Generic drug: Polyethyl Glycol-Propyl Glycol Place 1 drop into both eyes in the morning, at noon, in the evening, and at bedtime.   Vitamin D3 25 MCG (1000 UT) Caps Take 1 capsule (1,000 Units total) by mouth 2 (two) times daily. What changed:  medication strength how much to take               Durable Medical Equipment  (From admission, onward)           Start     Ordered   11/27/21 0836  For home use only DME Nebulizer machine  Once       Question Answer Comment  Patient needs a nebulizer to treat with the following condition COPD with acute exacerbation (HCC)   Length of Need 6 Months      11/27/21 0835               TOTAL DISCHARGE TIME: 35 minutes  Almetta Liddicoat [employer] on [email or web address]  11/27/2021, 8:46 AM

## 2021-11-29 ENCOUNTER — Inpatient Hospital Stay (HOSPITAL_COMMUNITY)
Admission: EM | Admit: 2021-11-29 | Discharge: 2021-12-03 | DRG: 194 | Disposition: A | Discharge status: Skilled Nursing Facility | Payer: Medicare HMO | Source: Skilled Nursing Facility | Attending: Internal Medicine | Admitting: Internal Medicine

## 2021-11-29 ENCOUNTER — Emergency Department (HOSPITAL_COMMUNITY): Payer: Medicare HMO

## 2021-11-29 ENCOUNTER — Encounter (HOSPITAL_COMMUNITY): Payer: Self-pay | Admitting: Emergency Medicine

## 2021-11-29 ENCOUNTER — Other Ambulatory Visit: Payer: Self-pay

## 2021-11-29 DIAGNOSIS — Y95 Nosocomial condition: Secondary | ICD-10-CM | POA: Diagnosis present

## 2021-11-29 DIAGNOSIS — N179 Acute kidney failure, unspecified: Secondary | ICD-10-CM | POA: Diagnosis present

## 2021-11-29 DIAGNOSIS — Z66 Do not resuscitate: Secondary | ICD-10-CM | POA: Diagnosis present

## 2021-11-29 DIAGNOSIS — F419 Anxiety disorder, unspecified: Secondary | ICD-10-CM | POA: Diagnosis present

## 2021-11-29 DIAGNOSIS — Z20822 Contact with and (suspected) exposure to covid-19: Secondary | ICD-10-CM | POA: Diagnosis present

## 2021-11-29 DIAGNOSIS — J9811 Atelectasis: Secondary | ICD-10-CM | POA: Diagnosis present

## 2021-11-29 DIAGNOSIS — Z7902 Long term (current) use of antithrombotics/antiplatelets: Secondary | ICD-10-CM

## 2021-11-29 DIAGNOSIS — J189 Pneumonia, unspecified organism: Secondary | ICD-10-CM | POA: Diagnosis present

## 2021-11-29 DIAGNOSIS — J9611 Chronic respiratory failure with hypoxia: Secondary | ICD-10-CM | POA: Diagnosis present

## 2021-11-29 DIAGNOSIS — I9589 Other hypotension: Secondary | ICD-10-CM | POA: Diagnosis not present

## 2021-11-29 DIAGNOSIS — I959 Hypotension, unspecified: Secondary | ICD-10-CM | POA: Diagnosis present

## 2021-11-29 DIAGNOSIS — J439 Emphysema, unspecified: Secondary | ICD-10-CM | POA: Diagnosis present

## 2021-11-29 DIAGNOSIS — G459 Transient cerebral ischemic attack, unspecified: Secondary | ICD-10-CM | POA: Diagnosis present

## 2021-11-29 DIAGNOSIS — Z7189 Other specified counseling: Secondary | ICD-10-CM

## 2021-11-29 DIAGNOSIS — D75839 Thrombocytosis, unspecified: Secondary | ICD-10-CM | POA: Diagnosis present

## 2021-11-29 DIAGNOSIS — E873 Alkalosis: Secondary | ICD-10-CM | POA: Diagnosis present

## 2021-11-29 DIAGNOSIS — E861 Hypovolemia: Secondary | ICD-10-CM

## 2021-11-29 DIAGNOSIS — Z515 Encounter for palliative care: Secondary | ICD-10-CM

## 2021-11-29 DIAGNOSIS — R197 Diarrhea, unspecified: Secondary | ICD-10-CM | POA: Diagnosis not present

## 2021-11-29 DIAGNOSIS — J441 Chronic obstructive pulmonary disease with (acute) exacerbation: Secondary | ICD-10-CM

## 2021-11-29 DIAGNOSIS — F319 Bipolar disorder, unspecified: Secondary | ICD-10-CM | POA: Diagnosis present

## 2021-11-29 DIAGNOSIS — I700 Atherosclerosis of aorta: Secondary | ICD-10-CM | POA: Diagnosis present

## 2021-11-29 DIAGNOSIS — Z79890 Hormone replacement therapy (postmenopausal): Secondary | ICD-10-CM

## 2021-11-29 DIAGNOSIS — J9621 Acute and chronic respiratory failure with hypoxia: Secondary | ICD-10-CM | POA: Diagnosis present

## 2021-11-29 DIAGNOSIS — E871 Hypo-osmolality and hyponatremia: Secondary | ICD-10-CM | POA: Diagnosis present

## 2021-11-29 DIAGNOSIS — Z885 Allergy status to narcotic agent status: Secondary | ICD-10-CM

## 2021-11-29 DIAGNOSIS — Z79899 Other long term (current) drug therapy: Secondary | ICD-10-CM

## 2021-11-29 DIAGNOSIS — Z8673 Personal history of transient ischemic attack (TIA), and cerebral infarction without residual deficits: Secondary | ICD-10-CM | POA: Diagnosis not present

## 2021-11-29 DIAGNOSIS — R0602 Shortness of breath: Secondary | ICD-10-CM | POA: Diagnosis not present

## 2021-11-29 DIAGNOSIS — N1831 Chronic kidney disease, stage 3a: Secondary | ICD-10-CM | POA: Diagnosis present

## 2021-11-29 DIAGNOSIS — Z888 Allergy status to other drugs, medicaments and biological substances status: Secondary | ICD-10-CM | POA: Diagnosis not present

## 2021-11-29 DIAGNOSIS — Z7952 Long term (current) use of systemic steroids: Secondary | ICD-10-CM

## 2021-11-29 DIAGNOSIS — E039 Hypothyroidism, unspecified: Secondary | ICD-10-CM | POA: Diagnosis present

## 2021-11-29 DIAGNOSIS — I5032 Chronic diastolic (congestive) heart failure: Secondary | ICD-10-CM | POA: Diagnosis present

## 2021-11-29 DIAGNOSIS — E860 Dehydration: Secondary | ICD-10-CM | POA: Diagnosis present

## 2021-11-29 LAB — COMPREHENSIVE METABOLIC PANEL
ALT: 28 U/L (ref 0–44)
AST: 19 U/L (ref 15–41)
Albumin: 3.7 g/dL (ref 3.5–5.0)
Alkaline Phosphatase: 99 U/L (ref 38–126)
Anion gap: 7 (ref 5–15)
BUN: 50 mg/dL — ABNORMAL HIGH (ref 8–23)
CO2: 26 mmol/L (ref 22–32)
Calcium: 9.3 mg/dL (ref 8.9–10.3)
Chloride: 103 mmol/L (ref 98–111)
Creatinine, Ser: 1.29 mg/dL — ABNORMAL HIGH (ref 0.44–1.00)
GFR, Estimated: 40 mL/min — ABNORMAL LOW (ref >60)
Glucose, Bld: 114 mg/dL — ABNORMAL HIGH (ref 70–99)
Potassium: 4.1 mmol/L (ref 3.5–5.1)
Sodium: 136 mmol/L (ref 135–145)
Total Bilirubin: 0.6 mg/dL (ref 0.3–1.2)
Total Protein: 7.2 g/dL (ref 6.5–8.1)

## 2021-11-29 LAB — CBC WITH DIFFERENTIAL/PLATELET
Abs Immature Granulocytes: 0.34 10*3/uL — ABNORMAL HIGH (ref 0.00–0.07)
Basophils Absolute: 0.1 10*3/uL (ref 0.0–0.1)
Basophils Relative: 0 %
Eosinophils Absolute: 0.2 10*3/uL (ref 0.0–0.5)
Eosinophils Relative: 1 %
HCT: 45.3 % (ref 36.0–46.0)
Hemoglobin: 14.7 g/dL (ref 12.0–15.0)
Immature Granulocytes: 2 %
Lymphocytes Relative: 9 %
Lymphs Abs: 1.6 10*3/uL (ref 0.7–4.0)
MCH: 29.1 pg (ref 26.0–34.0)
MCHC: 32.5 g/dL (ref 30.0–36.0)
MCV: 89.7 fL (ref 80.0–100.0)
Monocytes Absolute: 1.7 10*3/uL — ABNORMAL HIGH (ref 0.1–1.0)
Monocytes Relative: 10 %
Neutro Abs: 13.5 10*3/uL — ABNORMAL HIGH (ref 1.7–7.7)
Neutrophils Relative %: 78 %
Platelets: 432 10*3/uL — ABNORMAL HIGH (ref 150–400)
RBC: 5.05 MIL/uL (ref 3.87–5.11)
RDW: 14.6 % (ref 11.5–15.5)
WBC: 17.4 10*3/uL — ABNORMAL HIGH (ref 4.0–10.5)
nRBC: 0 % (ref 0.0–0.2)

## 2021-11-29 LAB — BLOOD GAS, VENOUS
Acid-base deficit: 1.9 mmol/L (ref 0.0–2.0)
Bicarbonate: 24.3 mmol/L (ref 20.0–28.0)
O2 Saturation: 49.6 %
Patient temperature: 37
pCO2, Ven: 46 mmHg (ref 44–60)
pH, Ven: 7.33 (ref 7.25–7.43)
pO2, Ven: 36 mmHg (ref 32–45)

## 2021-11-29 LAB — LACTIC ACID, PLASMA: Lactic Acid, Venous: 1.5 mmol/L (ref 0.5–1.9)

## 2021-11-29 LAB — RESP PANEL BY RT-PCR (FLU A&B, COVID) ARPGX2
Influenza A by PCR: NEGATIVE
Influenza B by PCR: NEGATIVE
SARS Coronavirus 2 by RT PCR: NEGATIVE

## 2021-11-29 LAB — PROTIME-INR
INR: 1 (ref 0.8–1.2)
Prothrombin Time: 13.1 seconds (ref 11.4–15.2)

## 2021-11-29 MED ORDER — VANCOMYCIN HCL 1250 MG/250ML IV SOLN: 1250.0000 mg | INTRAVENOUS | Status: DC

## 2021-11-29 MED ORDER — VANCOMYCIN HCL IN DEXTROSE 1-5 GM/200ML-% IV SOLN
1000.0000 mg | Freq: Once | INTRAVENOUS | Status: AC
  Administered 2021-11-30: 1000 mg via INTRAVENOUS
  Filled 2021-11-29: qty 200

## 2021-11-29 MED ORDER — SODIUM CHLORIDE 0.9 % IV BOLUS
500.0000 mL | Freq: Once | INTRAVENOUS | Status: AC
Start: 2021-11-29 — End: 2021-11-29
  Administered 2021-11-29: 500 mL via INTRAVENOUS

## 2021-11-29 MED ORDER — BUDESONIDE 0.25 MG/2ML IN SUSP
0.2500 mg | Freq: Once | RESPIRATORY_TRACT | Status: AC
  Administered 2021-11-29: 0.25 mg via RESPIRATORY_TRACT
  Filled 2021-11-29: qty 2

## 2021-11-29 MED ORDER — SODIUM CHLORIDE 0.9 % IV BOLUS
500.0000 mL | Freq: Once | INTRAVENOUS | Status: AC
Start: 2021-11-29 — End: 2021-11-30
  Administered 2021-11-29: 500 mL via INTRAVENOUS

## 2021-11-29 MED ORDER — METHYLPREDNISOLONE SODIUM SUCC 125 MG IJ SOLR
125.0000 mg | Freq: Once | INTRAMUSCULAR | Status: AC
  Administered 2021-11-29: 125 mg via INTRAVENOUS
  Filled 2021-11-29: qty 2

## 2021-11-29 MED ORDER — SODIUM CHLORIDE 0.9 % IV SOLN
2.0000 g | Freq: Once | INTRAVENOUS | Status: AC
  Administered 2021-11-29: 2 g via INTRAVENOUS
  Filled 2021-11-29: qty 2

## 2021-11-29 MED ORDER — IOHEXOL 350 MG/ML SOLN
100.0000 mL | Freq: Once | INTRAVENOUS | Status: AC | PRN
  Administered 2021-11-29: 80 mL via INTRAVENOUS

## 2021-11-29 NOTE — ED Provider Notes (Signed)
[facility] DEPT Provider Note   CSN: [id number] Arrival date & time: 11/29/21  2028     History  Chief Complaint  Patient presents with   Shortness of Breath   Urinary Tract Infection    Sheila Mccoy is a 88 y.o. female.  88 yo woman presents today from assisted living facility where she stated she "could not get a good breath" and had hypotension. EMS reported to nursing that patient was hypotensive to 70s/30s. Patient was recently hospitalized from 11/22/21 to 11/27/21 for COPD exacerbation. She reports she caught a cold last week that set off the exacerbation. At her baseline she is on 3-4L O2 via [place], currently satting well on 4L [place]. During her hospitalization, she was treated with nebulized budesonide and brovana, sent home on a steroid taper and cephalexin (also for incidental UTI). She denies any new symptoms, no chest pain, n/v/d.       Home Medications Prior to Admission medications   Medication Sig Start Date End Date Taking? Authorizing Provider  acetaminophen (TYLENOL) 500 MG tablet Take 1,000 mg by mouth every 6 (six) hours as needed for mild pain.    [provider]  albuterol (PROVENTIL) (2.5 MG/3ML) 0.083% nebulizer solution Take 3 mLs (2.5 mg total) by nebulization every 4 (four) hours as needed for shortness of breath or wheezing. 11/27/21   Bonnielee Haff, MD  albuterol (VENTOLIN HFA) 108 (90 Base) MCG/ACT inhaler Inhale 1-2 puffs into the lungs every 6 (six) hours as needed for wheezing or shortness of breath. 11/27/21   Bonnielee Haff, MD  BREO ELLIPTA 100-25 MCG/ACT AEPB Inhale 1 puff into the lungs daily. 11/20/21   [provider]  buPROPion (WELLBUTRIN) 75 MG tablet Take 75 mg by mouth daily.  06/13/20   [provider]  cephALEXin (KEFLEX) 500 MG capsule Take 1 capsule (500 mg total) by mouth every 8 (eight) hours for 4 days. 11/27/21 12/01/21  Bonnielee Haff, MD  Cholecalciferol (VITAMIN D3) 25 MCG (1000  UT) CAPS Take 1 capsule (1,000 Units total) by mouth 2 (two) times daily. 11/27/21   Bonnielee Haff, MD  clopidogrel (PLAVIX) 75 MG tablet Take 1 tablet (75 mg total) by mouth daily. 12/28/20   Ngetich, Dinah C, NP  Coenzyme Q10 (CO Q 10 PO) Take 120 mg by mouth 2 (two) times daily.    [provider]  cyanocobalamin 1000 MCG tablet Take 1,000 mcg by mouth daily.    [provider]  cyclobenzaprine (FLEXERIL) 10 MG tablet Take 1 tablet (10 mg total) by mouth 3 (three) times daily. Use with caution due to fall risk 02/09/21   Ngetich, Dinah C, NP  diclofenac Sodium (VOLTAREN) 1 % GEL Apply 2 g topically 4 (four) times daily as needed (pain).    [provider]  dorzolamide-timolol (COSOPT) 22.3-6.8 MG/ML ophthalmic solution Place 1 drop into the left eye 2 (two) times daily.    [provider]  doxycycline (VIBRA-TABS) 100 MG tablet Take 1 tablet (100 mg total) by mouth every 12 (twelve) hours for 4 days. 11/27/21 12/01/21  Bonnielee Haff, MD  famotidine (PEPCID) 10 MG tablet Take 10 mg by mouth every 12 (twelve) hours as needed for heartburn.    [provider]  fluticasone (FLONASE) 50 MCG/ACT nasal spray Place 1 spray into both nostrils daily. 01/18/21   Mercy Riding, MD  furosemide (LASIX) 20 MG tablet Take 20 mg by mouth every other day.    [provider]  guaiFENesin (  MUCINEX) 600 MG 12 hr tablet Take 1 tablet (600 mg total) by mouth 2 (two) times daily. 02/09/21   Ngetich, Dinah C, NP  latanoprost (XALATAN) 0.005 % ophthalmic solution Place 1 drop into both eyes at bedtime.    [provider]  levothyroxine (SYNTHROID) 137 MCG tablet Take 68.5 mcg by mouth daily before breakfast. empty stomach 1 hour before a meal with glass of water.    [provider]  loperamide (IMODIUM A-D) 2 MG tablet Take 2 mg by mouth every 8 (eight) hours as needed for diarrhea or loose stools. 07/23/21   [provider]  loratadine (CLARITIN)  10 MG tablet Take 1 tablet (10 mg total) by mouth daily. 01/19/21   Mercy Riding, MD  magnesium oxide (MAG-OX) 400 MG tablet Take 400 mg by mouth daily.    [provider]  Menthol, Topical Analgesic, (BIOFREEZE) 4 % GEL Apply 1 application topically every 12 (twelve) hours as needed (pain). Apply to Right Shoulder    [provider]  mirtazapine (REMERON) 15 MG tablet Take 1 tablet (15 mg total) by mouth at bedtime. 02/09/21   Ngetich, Dinah C, NP  pantoprazole (PROTONIX) 40 MG tablet Take 40 mg by mouth daily. 11/01/20   [provider]  Polyethyl Glycol-Propyl Glycol (SYSTANE) 0.4-0.3 % SOLN Place 1 drop into both eyes in the morning, at noon, in the evening, and at bedtime.    [provider]  polyethylene glycol (MIRALAX / GLYCOLAX) 17 g packet Take 17 g by mouth 2 (two) times daily as needed for mild constipation. 01/18/21   Mercy Riding, MD  predniSONE (DELTASONE) 20 MG tablet Take 3 tablets once daily for 3 days followed by 2 tablets once daily for 3 days followed by 1 tablet once daily for 3 days and then stop 11/27/21   Bonnielee Haff, MD  QUEtiapine (SEROQUEL) 25 MG tablet Take 1 tablet (25 mg total) by mouth at bedtime. 03/28/21   Kathrynn Ducking, MD  RESTASIS 0.05 % ophthalmic emulsion Place 1 drop into both eyes in the morning and at bedtime. 10/17/21   [provider]  RHOPRESSA 0.02 % SOLN Apply 1 drop to eye at bedtime. 11/18/21   [provider]      Allergies    Atorvastatin, Nitrofurantoin, Rosuvastatin, Hydromorphone, Morphine and related, Morphine, and Tramadol    Review of Systems   Review of Systems  Constitutional:  Negative for activity change, appetite change, chills and fever.  HENT:  Negative for congestion, rhinorrhea, sneezing and sore throat.   Respiratory:  Positive for cough, shortness of breath and wheezing.   Cardiovascular:  Negative for chest pain, palpitations and leg swelling.  Gastrointestinal:  Negative  for abdominal distention, abdominal pain, constipation, diarrhea, nausea and vomiting.  Genitourinary:  Negative for dysuria, frequency and urgency.  Skin:  Negative for color change.  Neurological:  Negative for dizziness, weakness and headaches.  Psychiatric/Behavioral:  Negative for confusion.    Physical Exam Updated Vital Signs BP (!) 102/56    Pulse 60    Temp 98.5 F (36.9 C) (Oral)    Resp 18    Ht 5\' 3"  (1.6 m)    Wt 65.8 kg    SpO2 95%    BMI 25.69 kg/m  Physical Exam Vitals and nursing note reviewed.  Constitutional:      General: She is not in acute distress.    Appearance: She is not ill-appearing, toxic-appearing or diaphoretic.  Comments: Frail, elderly woman, resting in bed, NAD  HENT:     Head: Normocephalic and atraumatic.  Eyes:     Pupils: Pupils are equal, round, and reactive to light.  Cardiovascular:     Rate and Rhythm: Normal rate and regular rhythm.  Pulmonary:     Effort: No tachypnea, accessory muscle usage or respiratory distress.     Breath sounds: Examination of the right-upper field reveals wheezing and rales. Examination of the left-upper field reveals wheezing. Examination of the right-middle field reveals wheezing. Examination of the left-middle field reveals wheezing. Examination of the right-lower field reveals wheezing and rales. Examination of the left-lower field reveals wheezing and rales. Wheezing and rales present.  Chest:     Chest wall: No tenderness or crepitus. There is no dullness to percussion.  Abdominal:     General: Bowel sounds are normal.     Palpations: Abdomen is soft. There is no mass.     Tenderness: There is no abdominal tenderness. There is no guarding or rebound.  Musculoskeletal:     Cervical back: Normal range of motion.     Right lower leg: No tenderness. No edema.     Left lower leg: No tenderness. No edema.  Skin:    General: Skin is warm and dry.     Capillary Refill: Capillary refill takes less than 2  seconds.     Coloration: Skin is pale.  Neurological:     General: No focal deficit present.     Mental Status: She is alert and oriented to person, place, and time.     Cranial Nerves: No cranial nerve deficit.     Motor: No weakness.  Psychiatric:        Mood and Affect: Mood normal.        Behavior: Behavior normal.    ED Results / Procedures / Treatments   Labs (all labs ordered are listed, but only abnormal results are displayed) Labs Reviewed  COMPREHENSIVE METABOLIC PANEL - Abnormal; Notable for the following components:      Result Value   Glucose, Bld 114 (*)    BUN 50 (*)    Creatinine, Ser 1.29 (*)    GFR, Estimated 40 (*)    All other components within normal limits  CBC WITH DIFFERENTIAL/PLATELET - Abnormal; Notable for the following components:   WBC 17.4 (*)    Platelets 432 (*)    Neutro Abs 13.5 (*)    Monocytes Absolute 1.7 (*)    Abs Immature Granulocytes 0.34 (*)    All other components within normal limits  RESP PANEL BY RT-PCR (FLU A&B, COVID) [id number]  CULTURE, BLOOD (ROUTINE X 2)  CULTURE, BLOOD (ROUTINE X 2)  LACTIC ACID, PLASMA  PROTIME-INR  BLOOD GAS, VENOUS  LACTIC ACID, PLASMA  URINALYSIS, ROUTINE W REFLEX MICROSCOPIC  I-STAT CHEM 8, ED    EKG EKG Interpretation  Date/Time:  Thursday November 29 2021 21:16:42 EST Ventricular Rate:  62 PR Interval:  286 QRS Duration: 138 QT Interval:  454 QTC Calculation: 462 R Axis:   91 Text Interpretation: Sinus rhythm Prolonged PR interval Nonspecific intraventricular conduction delay Anteroseptal infarct, old Repol abnrm suggests ischemia, lateral leads Confirmed by Wandra Arthurs [phone]) on 11/29/2021 9:41:38 PM  Radiology DG Chest 2 View  Result Date: 11/29/2021 CLINICAL DATA:  Suspected sepsis.  Shortness of breath EXAM: CHEST - 2 VIEW COMPARISON:  Radiograph 4 days ago 11/25/2021, CT 6 days ago 11/23/2021 FINDINGS: Left-sided pacemaker remains in place. Stable  heart size and mediastinal contours.  Aortic atherosclerosis. Increasing left lung base atelectasis with slight blunting of costophrenic angle. Biapical pleuroparenchymal scarring. No pulmonary edema. No pneumothorax. Postsurgical change right proximal humerus. Chronic deformity left proximal humerus. IMPRESSION: Increasing left lung base atelectasis. Possible small left pleural effusion. Electronically Signed   By: Narda Rutherford M.D.   On: 11/29/2021 21:35   CT Angio Chest PE W and/or Wo Contrast  Result Date: 11/29/2021 CLINICAL DATA:  Concern for pulmonary embolism. EXAM: CT ANGIOGRAPHY CHEST WITH CONTRAST TECHNIQUE: Multidetector CT imaging of the chest was performed using the standard protocol during bolus administration of intravenous contrast. Multiplanar CT image reconstructions and MIPs were obtained to evaluate the vascular anatomy. RADIATION DOSE REDUCTION: This exam was performed according to the departmental dose-optimization program which includes automated exposure control, adjustment of the mA and/or kV according to patient size and/or use of iterative reconstruction technique. CONTRAST:  59mL OMNIPAQUE IOHEXOL 350 MG/ML SOLN COMPARISON:  Chest CT dated 11/23/2021. FINDINGS: Evaluation of this exam is limited due to respiratory motion artifact. Cardiovascular: There is no cardiomegaly or pericardial effusion. There is coronary vascular calcification. Left pectoral pacemaker device. Mild atherosclerotic calcification of the thoracic aorta. No aneurysmal dilatation or dissection. Evaluation of the pulmonary arteries is limited due to respiratory motion artifact. Are no large or central pulmonary artery embolus identified. Mediastinum/Nodes: No hilar or mediastinal adenopathy. Small hiatal hernia. The esophagus is grossly unremarkable. No mediastinal fluid collection. Lungs/Pleura: Background of emphysema. Bibasilar dependent atelectasis. Pneumonia is not excluded. No pleural effusion or pneumothorax. The central airways are patent.  Upper Abdomen: No acute abnormality. Musculoskeletal: Osteopenia with degenerative changes of the spine. Right shoulder fixation plate and screws. No acute osseous pathology. Old right posterior rib fractures. Review of the MIP images confirms the above findings. IMPRESSION: 1. No CT evidence of central pulmonary artery embolus. 2. Bibasilar dependent atelectasis/scarring. Pneumonia is not excluded. 3. Aortic Atherosclerosis (ICD10-I70.0) and Emphysema (ICD10-J43.9). Electronically Signed   By: Elgie Collard M.D.   On: 11/29/2021 22:50    Procedures Procedures    Medications Ordered in ED Medications  ceFEPIme (MAXIPIME) 2 g in sodium chloride 0.9 % 100 mL IVPB (has no administration in time range)  vancomycin (VANCOCIN) IVPB 1000 mg/200 mL premix (has no administration in time range)  sodium chloride 0.9 % bolus 500 mL (has no administration in time range)  vancomycin (VANCOREADY) IVPB 1250 mg/250 mL (has no administration in time range)  sodium chloride 0.9 % bolus 500 mL (0 mLs Intravenous Stopped 11/29/21 2251)  methylPREDNISolone sodium succinate (SOLU-MEDROL) 125 mg/2 mL injection 125 mg (125 mg Intravenous Given 11/29/21 2156)  budesonide (PULMICORT) nebulizer solution 0.25 mg (0.25 mg Nebulization Given 11/29/21 2218)  iohexol (OMNIPAQUE) 350 MG/ML injection 100 mL (80 mLs Intravenous Contrast Given 11/29/21 2232)    ED Course/ Medical Decision Making/ A&P Clinical Course as of 11/29/21 2335  Thu Nov 29, 2021  2217 Patient had repeat hypotensive BP to 60s/40s, improved to 90s over 50s. Will treat with fluids and reassess. [CM]  2218 S cr is around baseline (1.08 to 1.29). Leukocytosis of 17 stable for the last week, she is also on steroids. PT/INR, LA, VBG all WNL. [CM]  2220 CXR with L lung base atelectasis, possible small left lung base pleural effusion, consistent with physical exam crackles. [CM]  2247 Awaiting CTA chest. Patient currently having borderline blood pressures,  90s/50s. IV bolus currently running. [CM]  2308 CTA chest shows atelectasis, cannot rule out pneumonia. BP improved  to 100s/60s, but intermittently 90s/50s. She is requiring 5L of [place]. Spoke with daughter who was caring for her today. She noted that her mother was working harder to breath and was confused (couldn't work her iphone which she normally can use without issue, couldn't follow instructions to get to the bathroom). She had low blood pressure 70s/30s at home, and had SpO2 to low-mid 80s and daughter called EMS. In setting of physical exam with crackles and CTA chest with possible pneumonia, will treat for HAP with cefepime and vancomycin and call for admission. [CM]  2319 Discussed with Dr. Myna Hidalgo, who will admit patient. [CM]    Clinical Course User Index [CM] Gladys Damme, MD                           Medical Decision Making 88 yo woman presents from assisted living facility with complaint of dyspnea at rest and significant hypotension. She has no new symptoms and no infectious symptoms, no fever. She is currently finishing a prednisone taper, on 60 mg prednisone today. She is also finishing keflex. PE significant for wheezing diffusely and crackles in bases. Patient is high risk for PE, recently had 1 week stay in hospital. Will obtain CTA chest. Will obtain basic lab work, CBC, CMP, troponin, ekg. Also will treat with budesonide nebulizer as this is what she did well with during admission.  Amount and/or Complexity of Data Reviewed External Data Reviewed: labs, radiology and notes. Labs: ordered. Radiology: ordered. ECG/medicine tests: ordered.  Risk Prescription drug management.           Final Clinical Impression(s) / ED Diagnoses Final diagnoses:  HAP (hospital-acquired pneumonia)  Hypotensive episode    Rx / DC Orders ED Discharge Orders     None         Gladys Damme, MD 11/29/21 2336    Drenda Freeze, MD 12/04/21 [phone]

## 2021-11-29 NOTE — ED Notes (Signed)
Respiratory called for breathing treatment.

## 2021-11-29 NOTE — Progress Notes (Signed)
Pharmacy Antibiotic Note  Sheila Mccoy is a 88 y.o. female admitted on 11/29/2021 with SOB., pt was recently hospitalized from 11/22/21 to 11/27/21 for COPD exacerbation  Pharmacy has been consulted to dose vancomycin for pna.  Plan: Vancomycin 1gm IV x 1 then 1250mg  IV q48h (AUC 525.7, Scr 1.29 Follow renal function and clinical course  Height: 5\' 3"  (160 cm) Weight: 65.8 kg (145 lb) IBW/kg (Calculated) : 52.4  Temp (24hrs), Avg:98.5 F (36.9 C), Min:98.5 F (36.9 C), Max:98.5 F (36.9 C)  Recent Labs  Lab 11/24/21 0219 11/25/21 0234 11/27/21 0356 11/29/21 2140  WBC 14.9* 17.4* 17.9* 17.4*  CREATININE 1.08* 0.93 1.08* 1.29*  LATICACIDVEN  --   --   --  1.5    Estimated Creatinine Clearance: 27.5 mL/min (A) (by C-G formula based on SCr of 1.29 mg/dL (H)).    Allergies  Allergen Reactions   Atorvastatin Other (See Comments)    Muscle paralysis. Other reaction(s): Other Unable to use legs Muscle paralysis.   Nitrofurantoin Other (See Comments)    tired Other reaction(s): Other PT reports possible allergy -- weakness, tiredness when taking tired   Rosuvastatin Other (See Comments)    Lower extremity weakness, gait instability and confusion Other reaction(s): Other Lower extremity weakness, gait instability and confusion   Hydromorphone Other (See Comments)    Very Disoriented & Confused   Morphine And Related Other (See Comments)    Unknown   Morphine     Other reaction(s): Confusion, Other (See Comments) Unsure of reaction Unsure of reaction    Tramadol Other (See Comments)    Causes confusion     Antimicrobials this admission: 2/24 cefepime x1 2/24 vanc >>  Dose adjustments this admission:   Microbiology results: 2/23 BCx:    Thank you for allowing pharmacy to be a part of this patients care.  3/24 RPh 11/29/2021, 11:27 PM

## 2021-11-29 NOTE — ED Triage Notes (Signed)
Shob x 1 week, being treated for a UTI, was recently admitted for same, family reports it's gotten worse.  Pulmonologist wanted patient re-evaluated.

## 2021-11-30 ENCOUNTER — Encounter (HOSPITAL_COMMUNITY): Payer: Self-pay | Admitting: Family Medicine

## 2021-11-30 DIAGNOSIS — Z7189 Other specified counseling: Secondary | ICD-10-CM

## 2021-11-30 DIAGNOSIS — N1831 Chronic kidney disease, stage 3a: Secondary | ICD-10-CM

## 2021-11-30 DIAGNOSIS — J441 Chronic obstructive pulmonary disease with (acute) exacerbation: Secondary | ICD-10-CM | POA: Diagnosis not present

## 2021-11-30 DIAGNOSIS — Z515 Encounter for palliative care: Secondary | ICD-10-CM

## 2021-11-30 DIAGNOSIS — N179 Acute kidney failure, unspecified: Secondary | ICD-10-CM | POA: Diagnosis not present

## 2021-11-30 DIAGNOSIS — I5032 Chronic diastolic (congestive) heart failure: Secondary | ICD-10-CM | POA: Diagnosis not present

## 2021-11-30 LAB — CBC
HCT: 43.3 % (ref 36.0–46.0)
Hemoglobin: 14.2 g/dL (ref 12.0–15.0)
MCH: 29 pg (ref 26.0–34.0)
MCHC: 32.8 g/dL (ref 30.0–36.0)
MCV: 88.5 fL (ref 80.0–100.0)
Platelets: 375 10*3/uL (ref 150–400)
RBC: 4.89 MIL/uL (ref 3.87–5.11)
RDW: 14.4 % (ref 11.5–15.5)
WBC: 15.8 10*3/uL — ABNORMAL HIGH (ref 4.0–10.5)
nRBC: 0 % (ref 0.0–0.2)

## 2021-11-30 LAB — SODIUM, URINE, RANDOM: Sodium, Ur: 110 mmol/L

## 2021-11-30 LAB — BASIC METABOLIC PANEL
Anion gap: 7 (ref 5–15)
BUN: 36 mg/dL — ABNORMAL HIGH (ref 8–23)
CO2: 21 mmol/L — ABNORMAL LOW (ref 22–32)
Calcium: 8.6 mg/dL — ABNORMAL LOW (ref 8.9–10.3)
Chloride: 108 mmol/L (ref 98–111)
Creatinine, Ser: 0.79 mg/dL (ref 0.44–1.00)
GFR, Estimated: >60 mL/min (ref >60)
Glucose, Bld: 139 mg/dL — ABNORMAL HIGH (ref 70–99)
Potassium: 4.3 mmol/L (ref 3.5–5.1)
Sodium: 136 mmol/L (ref 135–145)

## 2021-11-30 LAB — CREATININE, URINE, RANDOM: Creatinine, Urine: 59.64 mg/dL

## 2021-11-30 LAB — MRSA NEXT GEN BY PCR, NASAL: MRSA by PCR Next Gen: NOT DETECTED

## 2021-11-30 LAB — PROCALCITONIN: Procalcitonin: <0.1 ng/mL

## 2021-11-30 MED ORDER — BUPROPION HCL 75 MG PO TABS
75.0000 mg | ORAL_TABLET | Freq: Every day | ORAL | Status: DC
  Administered 2021-11-30 – 2021-12-03 (×4): 75 mg via ORAL
  Filled 2021-11-30 (×4): qty 1

## 2021-11-30 MED ORDER — PHENAZOPYRIDINE HCL 100 MG PO TABS: 100.0000 mg | ORAL_TABLET | Freq: Three times a day (TID) | ORAL | Status: DC

## 2021-11-30 MED ORDER — CLOPIDOGREL BISULFATE 75 MG PO TABS
75.0000 mg | ORAL_TABLET | Freq: Every day | ORAL | Status: DC
  Administered 2021-11-30 – 2021-12-03 (×4): 75 mg via ORAL
  Filled 2021-11-30 (×4): qty 1

## 2021-11-30 MED ORDER — CYCLOBENZAPRINE HCL 10 MG PO TABS: 10.0000 mg | ORAL_TABLET | Freq: Every day | ORAL | Status: DC | PRN

## 2021-11-30 MED ORDER — NETARSUDIL DIMESYLATE 0.02 % OP SOLN: 1.0000 [drp] | Freq: Every day | OPHTHALMIC | Status: DC

## 2021-11-30 MED ORDER — LACTATED RINGERS IV SOLN: INTRAVENOUS | Status: DC

## 2021-11-30 MED ORDER — ACETAMINOPHEN 325 MG PO TABS: 650.0000 mg | ORAL_TABLET | Freq: Four times a day (QID) | ORAL | Status: DC | PRN

## 2021-11-30 MED ORDER — FAMOTIDINE 20 MG PO TABS: 10.0000 mg | ORAL_TABLET | Freq: Two times a day (BID) | ORAL | Status: DC | PRN

## 2021-11-30 MED ORDER — SODIUM CHLORIDE 0.9 % IV SOLN
500.0000 mg | INTRAVENOUS | Status: DC
  Administered 2021-11-30 – 2021-12-01 (×2): 500 mg via INTRAVENOUS
  Filled 2021-11-30 (×2): qty 5

## 2021-11-30 MED ORDER — QUETIAPINE FUMARATE 25 MG PO TABS
25.0000 mg | ORAL_TABLET | Freq: Every day | ORAL | Status: DC
  Administered 2021-11-30 – 2021-12-02 (×3): 25 mg via ORAL
  Filled 2021-11-30 (×3): qty 1

## 2021-11-30 MED ORDER — ALBUTEROL SULFATE (2.5 MG/3ML) 0.083% IN NEBU
2.5000 mg | INHALATION_SOLUTION | RESPIRATORY_TRACT | Status: DC | PRN
  Administered 2021-11-30 – 2021-12-02 (×3): 2.5 mg via RESPIRATORY_TRACT
  Filled 2021-11-30 (×4): qty 3

## 2021-11-30 MED ORDER — VANCOMYCIN HCL 750 MG/150ML IV SOLN: 750.0000 mg | INTRAVENOUS | Status: DC

## 2021-11-30 MED ORDER — METHYLPREDNISOLONE SODIUM SUCC 40 MG IJ SOLR
40.0000 mg | Freq: Two times a day (BID) | INTRAMUSCULAR | Status: DC
  Administered 2021-11-30 – 2021-12-01 (×3): 40 mg via INTRAVENOUS
  Filled 2021-11-30 (×3): qty 1

## 2021-11-30 MED ORDER — DICLOFENAC SODIUM 1 % EX GEL: 2.0000 g | Freq: Four times a day (QID) | CUTANEOUS | Status: DC | PRN

## 2021-11-30 MED ORDER — SODIUM CHLORIDE 0.9 % IV SOLN
2.0000 g | INTRAVENOUS | Status: DC
  Administered 2021-11-30: 2 g via INTRAVENOUS
  Filled 2021-11-30 (×2): qty 20

## 2021-11-30 MED ORDER — MIRTAZAPINE 15 MG PO TABS
15.0000 mg | ORAL_TABLET | Freq: Every day | ORAL | Status: DC
  Administered 2021-11-30 – 2021-12-02 (×3): 15 mg via ORAL
  Filled 2021-11-30 (×3): qty 1

## 2021-11-30 MED ORDER — GUAIFENESIN ER 600 MG PO TB12
600.0000 mg | ORAL_TABLET | Freq: Two times a day (BID) | ORAL | Status: DC
  Administered 2021-11-30 – 2021-12-01 (×3): 600 mg via ORAL
  Filled 2021-11-30 (×3): qty 1

## 2021-11-30 MED ORDER — POLYETHYLENE GLYCOL 3350 17 G PO PACK: 17.0000 g | PACK | Freq: Every day | ORAL | Status: DC | PRN

## 2021-11-30 MED ORDER — PENTOSAN POLYSULFATE SODIUM 100 MG PO CAPS
100.0000 mg | ORAL_CAPSULE | Freq: Three times a day (TID) | ORAL | Status: DC
  Administered 2021-12-01 – 2021-12-03 (×6): 100 mg via ORAL
  Filled 2021-11-30 (×11): qty 1

## 2021-11-30 MED ORDER — LATANOPROST 0.005 % OP SOLN
1.0000 [drp] | Freq: Every day | OPHTHALMIC | Status: DC
  Administered 2021-11-30 – 2021-12-02 (×3): 1 [drp] via OPHTHALMIC
  Filled 2021-11-30: qty 2.5

## 2021-11-30 MED ORDER — ACETAMINOPHEN 650 MG RE SUPP: 650.0000 mg | Freq: Four times a day (QID) | RECTAL | Status: DC | PRN

## 2021-11-30 MED ORDER — OXYCODONE HCL 5 MG/5ML PO SOLN
2.5000 mg | ORAL | Status: DC | PRN
  Administered 2021-11-30: 2.5 mg via ORAL
  Filled 2021-11-30: qty 5

## 2021-11-30 MED ORDER — SODIUM CHLORIDE 0.9% FLUSH
3.0000 mL | Freq: Two times a day (BID) | INTRAVENOUS | Status: DC
  Administered 2021-11-30 – 2021-12-03 (×6): 3 mL via INTRAVENOUS

## 2021-11-30 MED ORDER — ARTIFICIAL TEARS OPHTHALMIC OINT
1.0000 "application " | TOPICAL_OINTMENT | Freq: Three times a day (TID) | OPHTHALMIC | Status: DC
  Administered 2021-11-30 – 2021-12-03 (×10): 1 via OPHTHALMIC
  Filled 2021-11-30: qty 3.5

## 2021-11-30 MED ORDER — HEPARIN SODIUM (PORCINE) 5000 UNIT/ML IJ SOLN
5000.0000 [IU] | Freq: Three times a day (TID) | INTRAMUSCULAR | Status: DC
  Administered 2021-11-30 – 2021-12-03 (×9): 5000 [IU] via SUBCUTANEOUS
  Filled 2021-11-30 (×9): qty 1

## 2021-11-30 MED ORDER — IPRATROPIUM-ALBUTEROL 0.5-2.5 (3) MG/3ML IN SOLN
3.0000 mL | Freq: Three times a day (TID) | RESPIRATORY_TRACT | Status: DC
  Administered 2021-11-30 (×3): 3 mL via RESPIRATORY_TRACT
  Filled 2021-11-30 (×5): qty 3

## 2021-11-30 MED ORDER — LEVOTHYROXINE SODIUM 25 MCG PO TABS
68.5000 ug | ORAL_TABLET | Freq: Every day | ORAL | Status: DC
  Administered 2021-11-30 – 2021-12-03 (×4): 68.5 ug via ORAL
  Filled 2021-11-30: qty 0.5
  Filled 2021-11-30 (×3): qty 1
  Filled 2021-11-30: qty 0.5

## 2021-11-30 MED ORDER — DORZOLAMIDE HCL-TIMOLOL MAL 2-0.5 % OP SOLN
1.0000 [drp] | Freq: Two times a day (BID) | OPHTHALMIC | Status: DC
  Administered 2021-11-30 – 2021-12-03 (×7): 1 [drp] via OPHTHALMIC
  Filled 2021-11-30: qty 10

## 2021-11-30 MED ORDER — PANTOPRAZOLE SODIUM 40 MG PO TBEC
40.0000 mg | DELAYED_RELEASE_TABLET | Freq: Every day | ORAL | Status: DC
  Administered 2021-11-30 – 2021-12-03 (×4): 40 mg via ORAL
  Filled 2021-11-30 (×4): qty 1

## 2021-11-30 NOTE — ED Notes (Signed)
Resp called about patient requesting a breathing treatment.

## 2021-11-30 NOTE — Progress Notes (Signed)
PROGRESS NOTE Sheila Mccoy  [id number] DOB: 22-Jun-1931 DOA: 11/29/2021 PCP: Pcp, No   Brief Narrative/Hospital Course: 88 y.o. female with medical history significant for COPD, chronic hypoxic respiratory failure, chronic diastolic CHF, hypothyroidism, CKD 3A, depression, anxiety, and history of TIA, now presenting to the emergency department for evaluation of shortness of breath and hypotension.   she had worsening shortness of breath since the recent hospital discharge, becoming severe just prior to her presentation to the ED and was found to be hypotensive with systolic pressure in the [id number] at her ALF.she reports that her productive cough is chronic but worsening over the past couple days. In ED-afebrile, saturating 90s on 4 L/min of supplemental oxygen, tachypneic, and with initial blood pressure 73 systolic.  CTA chest is negative for PE.Chemistry panel notable for creatinine 1.29.  CBC with stable leukocytosis and mild thrombocytosis.  Lactic acid was normal.  CT angio chest no PE, bibasilar dependent atelectasis/scarring, pneumonia not excluded, aortic atherosclerosis and emphysema , chest x-ray with increasing left lung base atelectasis possible left pleural effusion.Blood cultures collected and the patient was treated with IV Solu-Medrol, cefepime, vancomycin, Pulmicort, and IV fluids and she is being admitted for acute Ciprodex medicine with chronic respiratory failure, hypotension AKI on CKD stage III, chronic diastolic CHF anxiety TIA and hypothyroidism    Subjective: Seen and examined this morning.  Patient still complains of shortness of breath, sees on 4 L nasal cannula and saturating 93%.  At baseline she uses 4 L ambulation per liter at rest.  Complains of dry cough no chest pain nausea vomiting.  Assessment and Plan: * COPD with acute exacerbation ([place])- (present on admission) Chronic hypoxic respiratory failure on 3 to 4 L [place] at the facility: patient with shortness of breath and  dyspnea although oxygen requirement close to baseline.  Having cough dry dyspnea.  We will continue with iv steroids since still wheezing, antibiotics rocephin/azithro, and d/c vanco as procal neg. And quickly de-escalate antibiotic.  Follow-up culture data. Recent Labs  Lab 11/24/21 0219 11/25/21 0234 11/27/21 0356 11/29/21 2140 11/30/21 0600  WBC 14.9* 17.4* 17.9* 17.4* 15.8*  LATICACIDVEN  --   --   --  1.5  --   PROCALCITON  --   --   --   --  <0.10    Chronic respiratory failure with hypoxia ([place])- (present on admission) On 3 to nasal cannula at rest and 4 L on ambulation.  Currently on 4 L at rest.  Acute renal failure superimposed on stage 3a chronic kidney disease ([place])- (present on admission) This is likely in the setting of hypotension and prerenal.Creatinine has improved to 0.7 from 1.29.  Encourage oral hydration.  Holding Lasix.   Recent Labs  Lab 11/24/21 0219 11/25/21 0234 11/27/21 0356 11/29/21 2140 11/30/21 0600  BUN 22 22 34* 50* 36*  CREATININE 1.08* 0.93 1.08* 1.29* 0.79    Hypotension- (present on admission) Improved with IV fluids.  Holding Lasix.  Encourage oral intake.  Chronic diastolic CHF (congestive heart failure) ([place])- (present on admission) Dehydrated on admission, will continue to hold Lasix, monitor fluid status closely Net IO Since Admission: 750 mL [11/30/21 1109]  Filed Weights   11/29/21 2037  Weight: 65.8 kg    Goals of care, counseling/discussion [facility] was planning to meet with the patient and patient's family at assisted living facility to enroll into hospice care, will consult palliative care.  Currently DNR.  Hypothyroidism- (present on admission) Continue home Synthroid  TIA (transient ischemic  attack)- (present on admission) TIA history for which she is on Plavix.  DVT prophylaxis: heparin injection 5,000 Units Start: 11/30/21 0600 Code Status:   Code Status: DNR Family Communication: plan of care discussed  with patient at bedside.  Disposition: Currently not  medically stable for discharge. Status is: Inpatient Remains inpatient appropriate because: For ongoing management of COPD exacerbation dyspnea Objective: Vitals last 24 hrs: Vitals:   11/30/21 0345 11/30/21 0400 11/30/21 0435 11/30/21 0810  BP: (!) 88/72 107/82  122/74  Pulse: 71 70  63  Resp: (!) 28 (!) 26  (!) 21  Temp:      TempSrc:      SpO2: 90% 92% 94% 94%  Weight:      Height:       Weight change:   Physical Examination: General exam: AA,older than stated age, weak appearing.  Appears dyspneic. HEENT:Oral mucosa moist, Ear/Nose WNL grossly, dentition normal. Respiratory system: bilaterally diminished BS, with expiratory wheezing on deep breath, no use of accessory muscle Cardiovascular system: S1 & S2 +, No JVD,. Gastrointestinal system: Abdomen soft,NT,ND, BS+ Nervous System:Alert, awake, moving extremities and grossly nonfocal Extremities: LE edema none,distal peripheral pulses palpable.  Skin: No rashes,no icterus. MSK: Normal muscle bulk,tone, power  Medications reviewed: Scheduled Meds:  artificial tears  1 application Both Eyes TID   buPROPion  75 mg Oral Daily   clopidogrel  75 mg Oral Daily   dorzolamide-timolol  1 drop Both Eyes BID   guaiFENesin  600 mg Oral BID   heparin  5,000 Units Subcutaneous Q8H   ipratropium-albuterol  3 mL Nebulization TID   latanoprost  1 drop Both Eyes QHS   levothyroxine  68.5 mcg Oral Q0600   methylPREDNISolone (SOLU-MEDROL) injection  40 mg Intravenous Q12H   mirtazapine  15 mg Oral QHS   Netarsudil Dimesylate  1 drop Both Eyes QHS   pantoprazole  40 mg Oral Daily   QUEtiapine  25 mg Oral QHS   sodium chloride flush  3 mL Intravenous Q12H   Continuous Infusions:  azithromycin Stopped (11/30/21 0512)   cefTRIAXone (ROCEPHIN)  IV Stopped (11/30/21 1009)      Diet Order             Diet regular Room service appropriate? Yes; Fluid consistency: Thin  Diet  effective now                            Intake/Output Summary (Last 24 hours) at 11/30/2021 1115 Last data filed at 11/30/2021 0513 Gross per 24 hour  Intake 750 ml  Output --  Net 750 ml   Net IO Since Admission: 750 mL [11/30/21 1115]  Wt Readings from Last 3 Encounters:  11/29/21 65.8 kg  11/22/21 64.9 kg  03/28/21 64.9 kg     Unresulted Labs (From admission, onward)     Start     Ordered   11/30/21 [id number]  Basic metabolic panel  Daily,   R      11/30/21 0334   11/30/21 0500  CBC  Daily,   R      11/30/21 0334   11/30/21 0336  Sodium, urine, random  Once,   R        11/30/21 0335   11/30/21 0336  Creatinine, urine, random  Once,   R        11/30/21 0335   11/30/21 0336  Urea nitrogen, urine  Once,   R  11/30/21 0335   11/30/21 0334  MRSA Next Gen by PCR, Nasal  (COPD / Pneumonia / Cellulitis / Lower Extremity Wound)  Once,   R        11/30/21 0334   11/30/21 0333  Expectorated Sputum Assessment w Gram Stain, Rflx to Resp Cult  Once,   R        11/30/21 0334   11/29/21 2048  Culture, blood (Routine x 2)  BLOOD CULTURE X 2,   STAT      11/29/21 2048          Data Reviewed: I have personally reviewed following labs and imaging studies CBC: Recent Labs  Lab 11/24/21 0219 11/25/21 0234 11/27/21 0356 11/29/21 2140 11/30/21 0600  WBC 14.9* 17.4* 17.9* 17.4* 15.8*  NEUTROABS  --   --   --  13.5*  --   HGB 14.0 13.4 13.6 14.7 14.2  HCT 40.7 40.5 41.2 45.3 43.3  MCV 84.4 87.3 87.5 89.7 88.5  PLT 371 360 394 432* [id number]   Basic Metabolic Panel: Recent Labs  Lab 11/24/21 0219 11/25/21 0234 11/27/21 0356 11/29/21 2140 11/30/21 0600  NA 128* 136 135 136 136  K 4.4 4.0 4.4 4.1 4.3  CL 93* 103 101 103 108  CO2 24 23 26 26  21*  GLUCOSE 155* 99 95 114* 139*  BUN 22 22 34* 50* 36*  CREATININE 1.08* 0.93 1.08* 1.29* 0.79  CALCIUM 9.8 8.8* 9.3 9.3 8.6*   GFR: Estimated Creatinine Clearance: 44.4 mL/min (by C-G formula based on SCr of 0.79  mg/dL). Liver Function Tests: Recent Labs  Lab 11/29/21 2140  AST 19  ALT 28  ALKPHOS 99  BILITOT 0.6  PROT 7.2  ALBUMIN 3.7   No results for input(s): LIPASE, AMYLASE in the last 168 hours. No results for input(s): AMMONIA in the last 168 hours. Coagulation Profile: Recent Labs  Lab 11/29/21 2140  INR 1.0   Cardiac Enzymes: No results for input(s): CKTOTAL, CKMB, CKMBINDEX, TROPONINI in the last 168 hours. BNP (last 3 results) No results for input(s): PROBNP in the last 8760 hours. HbA1C: No results for input(s): HGBA1C in the last 72 hours. CBG: Recent Labs  Lab 11/26/21 1138  GLUCAP 193*   Lipid Profile: No results for input(s): CHOL, HDL, LDLCALC, TRIG, CHOLHDL, LDLDIRECT in the last 72 hours. Thyroid Function Tests: No results for input(s): TSH, T4TOTAL, FREET4, T3FREE, THYROIDAB in the last 72 hours. Anemia Panel: No results for input(s): VITAMINB12, FOLATE, FERRITIN, TIBC, IRON, RETICCTPCT in the last 72 hours. Sepsis Labs: Recent Labs  Lab 11/29/21 2140 11/30/21 0600  PROCALCITON  --  <0.10  LATICACIDVEN 1.5  --     Recent Results (from the past 240 hour(s))  Resp Panel by RT-PCR (Flu A&B, Covid) Nasopharyngeal Swab     Status: None   Collection Time: 11/23/21  8:40 AM   Specimen: Nasopharyngeal Swab; Nasopharyngeal(NP) swabs in vial transport medium  Result Value Ref Range Status   SARS Coronavirus 2 by RT PCR NEGATIVE NEGATIVE Final    Comment: (NOTE) SARS-CoV-2 target nucleic acids are NOT DETECTED.  The SARS-CoV-2 RNA is generally detectable in upper respiratory specimens during the acute phase of infection. The lowest concentration of SARS-CoV-2 viral copies this assay can detect is 138 copies/mL. A negative result does not preclude SARS-Cov-2 infection and should not be used as the sole basis for treatment or other patient management decisions. A negative result may occur with  improper specimen collection/handling, submission of specimen  other than nasopharyngeal swab, presence of viral mutation(s) within the areas targeted by this assay, and inadequate number of viral copies(<138 copies/mL). A negative result must be combined with clinical observations, patient history, and epidemiological information. The expected result is Negative.  Fact Sheet for Patients:  EntrepreneurPulse.com.au  Fact Sheet for Healthcare Providers:  IncredibleEmployment.be  This test is no t yet approved or cleared by the [place] FDA and  has been authorized for detection and/or diagnosis of SARS-CoV-2 by FDA under an Emergency Use Authorization (EUA). This EUA will remain  in effect (meaning this test can be used) for the duration of the COVID-19 declaration under Section 564(b)(1) of the Act, 21 U.S.C.section 360bbb-3(b)(1), unless the authorization is terminated  or revoked sooner.       Influenza A by PCR NEGATIVE NEGATIVE Final   Influenza B by PCR NEGATIVE NEGATIVE Final    Comment: (NOTE) The Xpert Xpress SARS-CoV-2/FLU/RSV plus assay is intended as an aid in the diagnosis of influenza from Nasopharyngeal swab specimens and should not be used as a sole basis for treatment. Nasal washings and aspirates are unacceptable for Xpert Xpress SARS-CoV-2/FLU/RSV testing.  Fact Sheet for Patients: EntrepreneurPulse.com.au  Fact Sheet for Healthcare Providers: IncredibleEmployment.be  This test is not yet approved or cleared by the [place] FDA and has been authorized for detection and/or diagnosis of SARS-CoV-2 by FDA under an Emergency Use Authorization (EUA). This EUA will remain in effect (meaning this test can be used) for the duration of the COVID-19 declaration under Section 564(b)(1) of the Act, 21 U.S.C. section 360bbb-3(b)(1), unless the authorization is terminated or revoked.  Performed at [facility], [address]  [place], [place] [address]   Culture, blood (Routine x 2)     Status: None (Preliminary result)   Collection Time: 11/29/21  9:40 PM   Specimen: BLOOD  Result Value Ref Range Status   Specimen Description   Final    BLOOD RIGHT ANTECUBITAL Performed at [facility] [address] [place], [place] [address]    Special Requests   Final    BOTTLES DRAWN AEROBIC AND ANAEROBIC Blood Culture adequate volume Performed at [facility] [address], [place], [place] [address]    Culture   Final    NO GROWTH < 12 HOURS Performed at [facility] [address] [place], [place] [address]    Report Status PENDING  Incomplete  Resp Panel by RT-PCR (Flu A&B, Covid) Nasopharyngeal Swab     Status: None   Collection Time: 11/29/21 10:00 PM   Specimen: Nasopharyngeal Swab; Nasopharyngeal(NP) swabs in vial transport medium  Result Value Ref Range Status   SARS Coronavirus 2 by RT PCR NEGATIVE NEGATIVE Final    Comment: (NOTE) SARS-CoV-2 target nucleic acids are NOT DETECTED.  The SARS-CoV-2 RNA is generally detectable in upper respiratory specimens during the acute phase of infection. The lowest concentration of SARS-CoV-2 viral copies this assay can detect is 138 copies/mL. A negative result does not preclude SARS-Cov-2 infection and should not be used as the sole basis for treatment or other patient management decisions. A negative result may occur with  improper specimen collection/handling, submission of specimen other than nasopharyngeal swab, presence of viral mutation(s) within the areas targeted by this assay, and inadequate number of viral copies(<138 copies/mL). A negative result must be combined with clinical observations, patient history, and epidemiological information. The expected result is Negative.  Fact Sheet for Patients:  EntrepreneurPulse.com.au  Fact Sheet for Healthcare Providers:   IncredibleEmployment.be  This test is no t yet approved or cleared by the [place] FDA and  has been authorized for detection and/or diagnosis of SARS-CoV-2 by FDA under an Emergency Use Authorization (EUA). This EUA will remain  in effect (meaning this test can be used) for the duration of the COVID-19 declaration under Section 564(b)(1) of the Act, 21 U.S.C.section 360bbb-3(b)(1), unless the authorization is terminated  or revoked sooner.       Influenza A by PCR NEGATIVE NEGATIVE Final   Influenza B by PCR NEGATIVE NEGATIVE Final    Comment: (NOTE) The Xpert Xpress SARS-CoV-2/FLU/RSV plus assay is intended as an aid in the diagnosis of influenza from Nasopharyngeal swab specimens and should not be used as a sole basis for treatment. Nasal washings and aspirates are unacceptable for Xpert Xpress SARS-CoV-2/FLU/RSV testing.  Fact Sheet for Patients: EntrepreneurPulse.com.au  Fact Sheet for Healthcare Providers: IncredibleEmployment.be  This test is not yet approved or cleared by the [place] FDA and has been authorized for detection and/or diagnosis of SARS-CoV-2 by FDA under an Emergency Use Authorization (EUA). This EUA will remain in effect (meaning this test can be used) for the duration of the COVID-19 declaration under Section 564(b)(1) of the Act, 21 U.S.C. section 360bbb-3(b)(1), unless the authorization is terminated or revoked.  Performed at [facility], [place] [address] [place], [place] [address]     Antimicrobials: Anti-infectives (From admission, onward)    Start     Dose/Rate Route Frequency Ordered Stop   12/01/21 2200  vancomycin (VANCOREADY) IVPB 1250 mg/250 mL  Status:  Discontinued        1,250 mg 166.7 mL/hr over 90 Minutes Intravenous Every 48 hours 11/29/21 2330 11/30/21 0948   11/30/21 2200  vancomycin (VANCOREADY) IVPB 750 mg/150 mL  Status:  Discontinued         750 mg 150 mL/hr over 60 Minutes Intravenous Every 24 hours 11/30/21 0948 11/30/21 1114   11/30/21 0800  cefTRIAXone (ROCEPHIN) 2 g in sodium chloride 0.9 % 100 mL IVPB        2 g 200 mL/hr over 30 Minutes Intravenous Every 24 hours 11/30/21 0334 12/05/21 0759   11/30/21 0345  azithromycin (ZITHROMAX) 500 mg in sodium chloride 0.9 % 250 mL IVPB        500 mg 250 mL/hr over 60 Minutes Intravenous Every 24 hours 11/30/21 0334 12/05/21 0344   11/29/21 2330  ceFEPIme (MAXIPIME) 2 g in sodium chloride 0.9 % 100 mL IVPB        2 g 200 mL/hr over 30 Minutes Intravenous  Once 11/29/21 2321 11/30/21 0041   11/29/21 2330  vancomycin (VANCOCIN) IVPB 1000 mg/200 mL premix        1,000 mg 200 mL/hr over 60 Minutes Intravenous  Once 11/29/21 2323 11/30/21 [id number]      Culture/Microbiology    Component Value Date/Time   SDES  11/29/2021 2140    BLOOD RIGHT ANTECUBITAL Performed at [facility], [place] [address] [place], [place] [address]    SPECREQUEST  11/29/2021 2140    BOTTLES DRAWN AEROBIC AND ANAEROBIC Blood Culture adequate volume Performed at [facility] [address] [place], [place] [address]    CULT  11/29/2021 2140    NO GROWTH < 12 HOURS Performed at [facility] [address] [place], [place] [address]    REPTSTATUS PENDING 11/29/2021 2140    Other  culture-see note  Radiology Studies: DG Chest 2 View  Result Date: 11/29/2021 CLINICAL DATA:  Suspected sepsis.  Shortness of breath EXAM: CHEST - 2 VIEW COMPARISON:  Radiograph 4 days ago 11/25/2021, CT 6 days ago 11/23/2021 FINDINGS: Left-sided pacemaker remains in place. Stable heart size and mediastinal contours. Aortic atherosclerosis. Increasing left lung base atelectasis with slight blunting of costophrenic angle. Biapical pleuroparenchymal scarring. No pulmonary edema. No pneumothorax. Postsurgical change right proximal humerus. Chronic deformity left proximal humerus. IMPRESSION:  Increasing left lung base atelectasis. Possible small left pleural effusion. Electronically Signed   By: Keith Rake M.D.   On: 11/29/2021 21:35   CT Angio Chest PE W and/or Wo Contrast  Result Date: 11/29/2021 CLINICAL DATA:  Concern for pulmonary embolism. EXAM: CT ANGIOGRAPHY CHEST WITH CONTRAST TECHNIQUE: Multidetector CT imaging of the chest was performed using the standard protocol during bolus administration of intravenous contrast. Multiplanar CT image reconstructions and MIPs were obtained to evaluate the vascular anatomy. RADIATION DOSE REDUCTION: This exam was performed according to the departmental dose-optimization program which includes automated exposure control, adjustment of the mA and/or kV according to patient size and/or use of iterative reconstruction technique. CONTRAST:  65mL OMNIPAQUE IOHEXOL 350 MG/ML SOLN COMPARISON:  Chest CT dated 11/23/2021. FINDINGS: Evaluation of this exam is limited due to respiratory motion artifact. Cardiovascular: There is no cardiomegaly or pericardial effusion. There is coronary vascular calcification. Left pectoral pacemaker device. Mild atherosclerotic calcification of the thoracic aorta. No aneurysmal dilatation or dissection. Evaluation of the pulmonary arteries is limited due to respiratory motion artifact. Are no large or central pulmonary artery embolus identified. Mediastinum/Nodes: No hilar or mediastinal adenopathy. Small hiatal hernia. The esophagus is grossly unremarkable. No mediastinal fluid collection. Lungs/Pleura: Background of emphysema. Bibasilar dependent atelectasis. Pneumonia is not excluded. No pleural effusion or pneumothorax. The central airways are patent. Upper Abdomen: No acute abnormality. Musculoskeletal: Osteopenia with degenerative changes of the spine. Right shoulder fixation plate and screws. No acute osseous pathology. Old right posterior rib fractures. Review of the MIP images confirms the above findings. IMPRESSION:  1. No CT evidence of central pulmonary artery embolus. 2. Bibasilar dependent atelectasis/scarring. Pneumonia is not excluded. 3. Aortic Atherosclerosis (ICD10-I70.0) and Emphysema (ICD10-J43.9). Electronically Signed   By: Anner Crete M.D.   On: 11/29/2021 22:50     LOS: 1 day   Antonieta Pert, MD [facility]  11/30/2021, 11:15 AM

## 2021-11-30 NOTE — ED Notes (Signed)
Patient cleaned of bowel/bladder incontinence, purewick placed.

## 2021-11-30 NOTE — Assessment & Plan Note (Addendum)
BP stable. Improved with IV fluids

## 2021-11-30 NOTE — Progress Notes (Signed)
Pharmacy Antibiotic Note  Sheila Mccoy is a 88 y.o. female recently hospitalized from 2/16 - 2/21 at [facility] for COPD exacerbation and UTI.  She was treated with ceftriaxone inpatient and discharged on keflex.  She presented back to the ED on 11/29/2021 with SOB.  CXR showed "increasing left lung base atelectasis."  She's currently on azithromycin, ceftriaxone and vancomycin for PNA.   Today, 11/30/2021: - day #1 abx - afeb, wbc down 15.8 - scr down 0.79 (crcl~44)   Plan: - adjust vancomycin to 750 mg q24h for est AUC 418 - ceftriaxone 2gm q24h and azithromycin 500 mg IV q24h per MD - f/u MRSA PCR  __________________________________________  Height: 5\' 3"  (160 cm) Weight: 65.8 kg (145 lb) IBW/kg (Calculated) : 52.4  Temp (24hrs), Avg:98.5 F (36.9 C), Min:98.5 F (36.9 C), Max:98.5 F (36.9 C)  Recent Labs  Lab 11/24/21 0219 11/25/21 0234 11/27/21 0356 11/29/21 2140 11/30/21 0600  WBC 14.9* 17.4* 17.9* 17.4* 15.8*  CREATININE 1.08* 0.93 1.08* 1.29* 0.79  LATICACIDVEN  --   --   --  1.5  --     Estimated Creatinine Clearance: 44.4 mL/min (by C-G formula based on SCr of 0.79 mg/dL).    Allergies  Allergen Reactions   Atorvastatin Other (See Comments)    Muscle paralysis and unable to use the legs   Nitrofurantoin Other (See Comments)    PT reports possible allergy -- weakness, tiredness when taking    Rosuvastatin Other (See Comments)    Lower extremity weakness, gait instability and confusion   Hydromorphone Other (See Comments)    Very Disoriented & Confused   Morphine And Related Other (See Comments)    Reaction not noted on MAR    Morphine Other (See Comments)    Confusion     Tramadol Other (See Comments)    Causes confusion     Thank you for allowing pharmacy to be a part of this patients care.  12/02/21 11/30/2021 9:41 AM

## 2021-11-30 NOTE — Plan of Care (Signed)
  Problem: Activity: Goal: Ability to tolerate increased activity will improve Outcome: Progressing   Problem: Clinical Measurements: Goal: Ability to maintain a body temperature in the normal range will improve Outcome: Progressing   Problem: Respiratory: Goal: Ability to maintain adequate ventilation will improve Outcome: Progressing Goal: Ability to maintain a clear airway will improve Outcome: Progressing   Problem: Education: Goal: Knowledge of General Education information will improve Description: Including pain rating scale, medication(s)/side effects and non-pharmacologic comfort measures Outcome: Progressing   Problem: Health Behavior/Discharge Planning: Goal: Ability to manage health-related needs will improve Outcome: Progressing   Problem: Clinical Measurements: Goal: Ability to maintain clinical measurements within normal limits will improve Outcome: Progressing Goal: Will remain free from infection Outcome: Progressing   

## 2021-11-30 NOTE — Assessment & Plan Note (Addendum)
On 3 to nasal cannula at rest and 4 L on ambulation.  Oxygen requirement has not increased ands stable spo2.

## 2021-11-30 NOTE — Progress Notes (Signed)
° °  We were prepared to meet with family and pt today at her assisted living facility to enroll pt into hospice care. However, the pt ended up going to hospital last night and is being admitted. The Daughter Barnie Alderman has called to update [place] this morning prior to the 1000am meeting and would like to proceed with  hospice referral process when d/c.   Please let us know if we can assist with d/c plan-- Norm Parcel RN

## 2021-11-30 NOTE — Hospital Course (Addendum)
88 y.o. female with medical history significant for COPD, chronic hypoxic respiratory failure, chronic diastolic CHF, hypothyroidism, CKD 3A, depression, anxiety, and history of TIA, now presenting to the emergency department for evaluation of shortness of breath and hypotension.   she had worsening shortness of breath since the recent hospital discharge, becoming severe just prior to her presentation to the ED and was found to be hypotensive with systolic pressure in the [id number] at her ALF.she reports that her productive cough is chronic but worsening over the past couple days. In ED-afebrile, saturating 90s on 4 L/min of supplemental oxygen, tachypneic, and with initial blood pressure 73 systolic.  CTA chest is negative for PE.Chemistry panel notable for creatinine 1.29.  CBC with stable leukocytosis and mild thrombocytosis.  Lactic acid was normal.  CT angio chest no PE, bibasilar dependent atelectasis/scarring, pneumonia not excluded, aortic atherosclerosis and emphysema , chest x-ray with increasing left lung base atelectasis possible left pleural effusion.Blood cultures collected and the patient was treated with IV Solu-Medrol, cefepime, vancomycin, Pulmicort, and IV fluids and she is being admitted for acute Ciprodex medicine with chronic respiratory failure, hypotension AKI on CKD stage III, chronic diastolic CHF anxiety TIA and hypothyroidism. Patient has clinically improved with his steroids supplemental oxygen and bronchodilators. Patient had episodes of diarrhea GI panel/C. difficile was ordered 2/24 given recent antibiotic use and leukocytosis. Patient had no recurrence of diarrhea stool sample unable to be sent.  She had some dysuria but UA unremarkable.  WBC count up to 21 K and likely from patient's steroid, as she is afebrile.  Antibiotic regimen de-escalated and now on oral doxycycline to cover for COPD, oral prednisone.  She had some rash that is improving on the cheeks-unclear  Etiology. Seen by  palliative care, had a family meeting and plan is for back to facility with with hospice. Today am she feels better, not short of breath. She feels she is ready for return to ALF with hospice care.

## 2021-11-30 NOTE — Assessment & Plan Note (Signed)
-   Continue home Synthroid °

## 2021-11-30 NOTE — Assessment & Plan Note (Addendum)
Secondary to hypotension/prerenal.resolved. cont on home Lasix  Recent Labs  Lab 11/27/21 0356 11/29/21 2140 11/30/21 0600 12/01/21 0640  BUN 34* 50* 36* 23  CREATININE 1.08* 1.29* 0.79 0.70

## 2021-11-30 NOTE — H&P (Signed)
History and Physical    Dewayna Guiang BZM:[id number] DOB: 12-31-1930 DOA: 11/29/2021  PCP: Pcp, No   Patient coming from: ALF   Chief Complaint: SOB, hypotension   HPI: Sheila Mccoy is a pleasant 88 y.o. female with medical history significant for COPD, chronic hypoxic respiratory failure, chronic diastolic CHF, hypothyroidism, CKD 3A, depression, anxiety, and history of TIA, now presenting to the emergency department for evaluation of shortness of breath and hypotension.  Patient reports developing worsening shortness of breath since the recent hospital discharge, becoming severe just prior to her presentation to the ED today.  She was found to be hypotensive with systolic pressure in the 70s at her ALF.  She denies chest pain or leg swelling.  She reports that her productive cough is chronic but worsening over the past couple days.  ED Course: Upon arrival to the ED, patient is found to be afebrile, saturating 90s on 4 L/min of supplemental oxygen, tachypneic, and with initial blood pressure 73 systolic.  CTA chest is negative for PE.  Chemistry panel notable for creatinine 1.29.  CBC with stable leukocytosis and mild thrombocytosis.  Lactic acid was normal.  Blood cultures collected and the patient was treated with IV Solu-Medrol, cefepime, vancomycin, Pulmicort, and IV fluids.  Review of Systems:  All other systems reviewed and apart from HPI, are negative.  Past Medical History:  Diagnosis Date   Acute metabolic encephalopathy 07/09/2020   Adnexal cyst 07/09/2020   Anxiety    Artificial pacemaker    Bipolar disorder (HCC)    COPD (chronic obstructive pulmonary disease) (HCC)    Depression    History of CT scan 2021   Legs.   Hypothyroidism 09/30/2020   Memory difficulty 01/02/2021   Sacral fracture (HCC) 07/09/2020   TIA (transient ischemic attack) 09/29/2020   UTI (urinary tract infection) 07/09/2020    Past Surgical History:  Procedure Laterality Date   arm fracture  surgery     MRI  2021   Lungs.   right shoulder      pins in right shoulder.    Social History:   reports that she has never smoked. She has never used smokeless tobacco. She reports that she does not drink alcohol and does not use drugs.  Allergies  Allergen Reactions   Atorvastatin Other (See Comments)    Muscle paralysis and unable to use the legs   Nitrofurantoin Other (See Comments)    PT reports possible allergy -- weakness, tiredness when taking    Rosuvastatin Other (See Comments)    Lower extremity weakness, gait instability and confusion   Hydromorphone Other (See Comments)    Very Disoriented & Confused   Morphine And Related Other (See Comments)    Reaction not noted on MAR    Morphine Other (See Comments)    Confusion     Tramadol Other (See Comments)    Causes confusion     Family History  Problem Relation Age of Onset   Lung cancer Mother    Cancer Sister    Cancer Brother    Cancer Sister    Sarcoidosis Brother    Leukemia Daughter      Prior to Admission medications   Medication Sig Start Date End Date Taking? Authorizing Provider  acetaminophen (TYLENOL) 500 MG tablet Take 1,000 mg by mouth every 6 (six) hours as needed for mild pain.   Yes [provider]  albuterol (PROVENTIL) (2.5 MG/3ML) 0.083% nebulizer solution Take 3 mLs (2.5 mg total) by nebulization  every 4 (four) hours as needed for shortness of breath or wheezing. Patient taking differently: Take 2.5 mg by nebulization every 4 (four) hours as needed for shortness of breath. 11/27/21  Yes Osvaldo ShipperKrishnan, Gokul, MD  albuterol (VENTOLIN HFA) 108 (90 Base) MCG/ACT inhaler Inhale 1-2 puffs into the lungs every 6 (six) hours as needed for wheezing or shortness of breath. Patient taking differently: Inhale 2 puffs into the lungs every 6 (six) hours as needed for wheezing or shortness of breath. 11/27/21  Yes Osvaldo ShipperKrishnan, Gokul, MD  BREO ELLIPTA 100-25 MCG/ACT AEPB Inhale 1 puff into the lungs  daily. 11/20/21  Yes [provider]  buPROPion (WELLBUTRIN) 75 MG tablet Take 75 mg by mouth daily.  06/13/20  Yes [provider]  cephALEXin (KEFLEX) 500 MG capsule Take 1 capsule (500 mg total) by mouth every 8 (eight) hours for 4 days. 11/27/21 12/01/21 Yes Osvaldo ShipperKrishnan, Gokul, MD  Cholecalciferol (VITAMIN D3) 25 MCG (1000 UT) CAPS Take 1 capsule (1,000 Units total) by mouth 2 (two) times daily. 11/27/21  Yes Osvaldo ShipperKrishnan, Gokul, MD  clopidogrel (PLAVIX) 75 MG tablet Take 1 tablet (75 mg total) by mouth daily. 12/28/20  Yes Ngetich, Dinah C, NP  Coenzyme Q10 (CO Q 10) 60 MG CAPS Take 120 mg by mouth in the morning and at bedtime.   Yes [provider]  COSOPT 22.3-6.8 MG/ML ophthalmic solution 1 drop 2 (two) times daily.   Yes [provider]  cyanocobalamin 1000 MCG tablet Take 1,000 mcg by mouth daily.   Yes [provider]  cyclobenzaprine (FLEXERIL) 10 MG tablet Take 1 tablet (10 mg total) by mouth 3 (three) times daily. Use with caution due to fall risk Patient taking differently: Take 10 mg by mouth daily as needed for muscle spasms (Use with caution due to fall risk). 02/09/21  Yes Ngetich, Dinah C, NP  diclofenac Sodium (VOLTAREN) 1 % GEL Apply 2 g topically every 6 (six) hours as needed (for back pain).   Yes [provider]  doxycycline (VIBRA-TABS) 100 MG tablet Take 1 tablet (100 mg total) by mouth every 12 (twelve) hours for 4 days. 11/27/21 12/01/21 Yes Osvaldo ShipperKrishnan, Gokul, MD  famotidine (PEPCID) 10 MG tablet Take 10 mg by mouth every 12 (twelve) hours as needed for heartburn.   Yes [provider]  fluticasone (FLONASE) 50 MCG/ACT nasal spray Place 1 spray into both nostrils daily. Patient taking differently: Place 2 sprays into both nostrils daily as needed (for congestion). 01/18/21  Yes Almon HerculesGonfa, Taye T, MD  furosemide (LASIX) 20 MG tablet Take 20 mg by mouth every other day.   Yes [provider]  guaiFENesin (MUCINEX) 600 MG 12 hr  tablet Take 1 tablet (600 mg total) by mouth 2 (two) times daily. 02/09/21  Yes Ngetich, Dinah C, NP  latanoprost (XALATAN) 0.005 % ophthalmic solution Place 1 drop into both eyes at bedtime.   Yes [provider]  loperamide (IMODIUM A-D) 2 MG tablet Take 2 mg by mouth every 8 (eight) hours as needed (for diarrhea). 07/23/21  Yes [provider]  loratadine (CLARITIN) 10 MG tablet Take 1 tablet (10 mg total) by mouth daily. 01/19/21  Yes Almon HerculesGonfa, Taye T, MD  magnesium oxide (MAG-OX) 400 MG tablet Take 400 mg by mouth daily.   Yes [provider]  Menthol, Topical Analgesic, (BIOFREEZE) 4 % GEL Apply 1 application topically every 12 (twelve) hours as needed (for right shoulder pain).   Yes [provider]  mirtazapine (REMERON) 15  MG tablet Take 1 tablet (15 mg total) by mouth at bedtime. 02/09/21  Yes Ngetich, Dinah C, NP  OXYGEN Inhale 3-4 L/min into the lungs continuous. Inhale 3 L/min of oxygen into the lungs continuously and increase to 4 L/min when ambulating or sats drop below 90%   Yes [provider]  pantoprazole (PROTONIX) 40 MG tablet Take 40 mg by mouth daily. 11/01/20  Yes [provider]  polyethylene glycol (MIRALAX / GLYCOLAX) 17 g packet Take 17 g by mouth 2 (two) times daily as needed for mild constipation. Patient taking differently: Take 17 g by mouth daily as needed for mild constipation. 01/18/21  Yes Almon Hercules, MD  predniSONE (DELTASONE) 20 MG tablet Take 3 tablets once daily for 3 days followed by 2 tablets once daily for 3 days followed by 1 tablet once daily for 3 days and then stop Patient taking differently: Take 20-60 mg by mouth See admin instructions. Starting on 11/28/2021, take 60 mg by mouth once a day for 3 days, 40 mg once a day for 3 days, 20 mg once a day for 3 days, then stop 11/27/21  Yes Osvaldo Shipper, MD  QUEtiapine (SEROQUEL) 25 MG tablet Take 1 tablet (25 mg total) by mouth at bedtime. 03/28/21  Yes York Spaniel, MD  RHOPRESSA 0.02 % SOLN Place 1 drop into both eyes at bedtime. 11/18/21  Yes [provider]  SYNTHROID 137 MCG tablet Take 68.5 mcg by mouth daily before breakfast.   Yes [provider]  SYSTANE 0.4-0.3 % GEL ophthalmic gel Place 1 application into both eyes 3 (three) times daily.   Yes [provider]  SYSTANE 0.4-0.3 % SOLN Place 1 drop into both eyes 4 (four) times daily.   Yes [provider]    Physical Exam: Vitals:   11/29/21 2245 11/29/21 2300 11/29/21 2315 11/30/21 0245  BP: (!) 119/102 (!) 126/56 (!) 102/56 (!) 120/54  Pulse: (!) 125 62 60 60  Resp: (!) 23 19 18 18   Temp:      TempSrc:      SpO2: 94% 97% 95% 96%  Weight:      Height:        Constitutional: NAD, calm  Eyes: PERTLA, lids and conjunctivae normal ENMT: Mucous membranes are moist. Posterior pharynx clear of any exudate or lesions.   Neck: supple, no masses  Respiratory: Diminished breath sounds bilaterally, prolonged expiratory phase, wheezes. Speaking full sentences.   Cardiovascular: S1 & S2 heard, regular rate and rhythm. No extremity edema.  Abdomen: No distension, no tenderness, soft. Bowel sounds active.  Musculoskeletal: no clubbing / cyanosis. No joint deformity upper and lower extremities.   Skin: no significant rashes, lesions, ulcers. Warm, dry, well-perfused. Neurologic: CN 2-12 grossly intact. Moving all extremities. Alert and oriented to person and place.  Psychiatric: Very pleasant. Cooperative.    Labs and Imaging on Admission: I have personally reviewed following labs and imaging studies  CBC: Recent Labs  Lab 11/24/21 0219 11/25/21 0234 11/27/21 0356 11/29/21 2140  WBC 14.9* 17.4* 17.9* 17.4*  NEUTROABS  --   --   --  13.5*  HGB 14.0 13.4 13.6 14.7  HCT 40.7 40.5 41.2 45.3  MCV 84.4 87.3 87.5 89.7  PLT 371 360 394 432*   Basic Metabolic Panel: Recent Labs  Lab 11/24/21 0219 11/25/21 0234 11/27/21 0356 11/29/21 2140  NA  128* 136 135 136  K 4.4 4.0 4.4 4.1  CL 93* 103 101 103  CO2 24 23 26 26   GLUCOSE 155* 99 95 114*  BUN 22 22 34* 50*  CREATININE 1.08* 0.93 1.08* 1.29*  CALCIUM 9.8 8.8* 9.3 9.3   GFR: Estimated Creatinine Clearance: 27.5 mL/min (A) (by C-G formula based on SCr of 1.29 mg/dL (H)). Liver Function Tests: Recent Labs  Lab 11/29/21 2140  AST 19  ALT 28  ALKPHOS 99  BILITOT 0.6  PROT 7.2  ALBUMIN 3.7   No results for input(s): LIPASE, AMYLASE in the last 168 hours. No results for input(s): AMMONIA in the last 168 hours. Coagulation Profile: Recent Labs  Lab 11/29/21 2140  INR 1.0   Cardiac Enzymes: No results for input(s): CKTOTAL, CKMB, CKMBINDEX, TROPONINI in the last 168 hours. BNP (last 3 results) No results for input(s): PROBNP in the last 8760 hours. HbA1C: No results for input(s): HGBA1C in the last 72 hours. CBG: Recent Labs  Lab 11/26/21 1138  GLUCAP 193*   Lipid Profile: No results for input(s): CHOL, HDL, LDLCALC, TRIG, CHOLHDL, LDLDIRECT in the last 72 hours. Thyroid Function Tests: No results for input(s): TSH, T4TOTAL, FREET4, T3FREE, THYROIDAB in the last 72 hours. Anemia Panel: No results for input(s): VITAMINB12, FOLATE, FERRITIN, TIBC, IRON, RETICCTPCT in the last 72 hours. Urine analysis:    Component Value Date/Time   COLORURINE YELLOW 11/23/2021 0117   APPEARANCEUR CLEAR 11/23/2021 0117   LABSPEC 1.010 11/23/2021 0117   PHURINE 7.0 11/23/2021 0117   GLUCOSEU NEGATIVE 11/23/2021 0117   HGBUR NEGATIVE 11/23/2021 0117   BILIRUBINUR NEGATIVE 11/23/2021 0117   KETONESUR NEGATIVE 11/23/2021 0117   PROTEINUR NEGATIVE 11/23/2021 0117   NITRITE NEGATIVE 11/23/2021 0117   LEUKOCYTESUR MODERATE (A) 11/23/2021 0117   Sepsis Labs: @LABRCNTIP (procalcitonin:4,lacticidven:4) ) Recent Results (from the past 240 hour(s))  Resp Panel by RT-PCR (Flu A&B, Covid) Nasopharyngeal Swab     Status: None   Collection Time: 11/23/21  8:40 AM   Specimen:  Nasopharyngeal Swab; Nasopharyngeal(NP) swabs in vial transport medium  Result Value Ref Range Status   SARS Coronavirus 2 by RT PCR NEGATIVE NEGATIVE Final    Comment: (NOTE) SARS-CoV-2 target nucleic acids are NOT DETECTED.  The SARS-CoV-2 RNA is generally detectable in upper respiratory specimens during the acute phase of infection. The lowest concentration of SARS-CoV-2 viral copies this assay can detect is 138 copies/mL. A negative result does not preclude SARS-Cov-2 infection and should not be used as the sole basis for treatment or other patient management decisions. A negative result may occur with  improper specimen collection/handling, submission of specimen other than nasopharyngeal swab, presence of viral mutation(s) within the areas targeted by this assay, and inadequate number of viral copies(<138 copies/mL). A negative result must be combined with clinical observations, patient history, and epidemiological information. The expected result is Negative.  Fact Sheet for Patients:   Fact Sheet for Healthcare Providers:  11/25/21  This test is no t yet approved or cleared by the [employer] FDA and  has been authorized for detection and/or diagnosis of SARS-CoV-2 by FDA under an Emergency Use Authorization (EUA). This EUA will remain  in effect (meaning this test can be used) for the duration of the COVID-19 declaration under Section 564(b)(1) of the Act, 21 U.S.C.section 360bbb-3(b)(1), unless the authorization is terminated  or revoked sooner.       Influenza A by PCR NEGATIVE NEGATIVE Final   Influenza B by PCR NEGATIVE NEGATIVE Final    Comment: (NOTE) The Xpert Xpress SARS-CoV-2/FLU/RSV plus assay is intended as an  aid in the diagnosis of influenza from Nasopharyngeal swab specimens and should not be used as a sole basis for treatment. Nasal washings and aspirates are unacceptable for  Xpert Xpress SARS-CoV-2/FLU/RSV testing.  Fact Sheet for Patients: [employer]com  Fact Sheet for Healthcare Providers: SeriousBroker.it  This test is not yet approved or cleared by the [place] FDA and has been authorized for detection and/or diagnosis of SARS-CoV-2 by FDA under an Emergency Use Authorization (EUA). This EUA will remain in effect (meaning this test can be used) for the duration of the COVID-19 declaration under Section 564(b)(1) of the Act, 21 U.S.C. section 360bbb-3(b)(1), unless the authorization is terminated or revoked.  Performed at [facility], [address] [place], [place] [address]   Resp Panel by RT-PCR (Flu A&B, Covid) Nasopharyngeal Swab     Status: None   Collection Time: 11/29/21 10:00 PM   Specimen: Nasopharyngeal Swab; Nasopharyngeal(NP) swabs in vial transport medium  Result Value Ref Range Status   SARS Coronavirus 2 by RT PCR NEGATIVE NEGATIVE Final    Comment: (NOTE) SARS-CoV-2 target nucleic acids are NOT DETECTED.  The SARS-CoV-2 RNA is generally detectable in upper respiratory specimens during the acute phase of infection. The lowest concentration of SARS-CoV-2 viral copies this assay can detect is 138 copies/mL. A negative result does not preclude SARS-Cov-2 infection and should not be used as the sole basis for treatment or other patient management decisions. A negative result may occur with  improper specimen collection/handling, submission of specimen other than nasopharyngeal swab, presence of viral mutation(s) within the areas targeted by this assay, and inadequate number of viral copies(<138 copies/mL). A negative result must be combined with clinical observations, patient history, and epidemiological information. The expected result is Negative.  Fact Sheet for Patients:  [employer]Course[employer]com  Fact Sheet for Healthcare Providers:   SeriousBroker.it  This test is no t yet approved or cleared by the [place] FDA and  has been authorized for detection and/or diagnosis of SARS-CoV-2 by FDA under an Emergency Use Authorization (EUA). This EUA will remain  in effect (meaning this test can be used) for the duration of the COVID-19 declaration under Section 564(b)(1) of the Act, 21 U.S.C.section 360bbb-3(b)(1), unless the authorization is terminated  or revoked sooner.       Influenza A by PCR NEGATIVE NEGATIVE Final   Influenza B by PCR NEGATIVE NEGATIVE Final    Comment: (NOTE) The Xpert Xpress SARS-CoV-2/FLU/RSV plus assay is intended as an aid in the diagnosis of influenza from Nasopharyngeal swab specimens and should not be used as a sole basis for treatment. Nasal washings and aspirates are unacceptable for Xpert Xpress SARS-CoV-2/FLU/RSV testing.  Fact Sheet for Patients: BloggerCourse.com  Fact Sheet for Healthcare Providers: SeriousBroker.it  This test is not yet approved or cleared by the [place] FDA and has been authorized for detection and/or diagnosis of SARS-CoV-2 by FDA under an Emergency Use Authorization (EUA). This EUA will remain in effect (meaning this test can be used) for the duration of the COVID-19 declaration under Section 564(b)(1) of the Act, 21 U.S.C. section 360bbb-3(b)(1), unless the authorization is terminated or revoked.  Performed at [facility], [facility], [address], [place], [place] [address]      Radiological Exams on Admission: DG Chest 2 View  Result Date: 11/29/2021 CLINICAL DATA:  Suspected sepsis.  Shortness of breath EXAM: CHEST - 2 VIEW COMPARISON:  Radiograph 4 days ago 11/25/2021, CT 6 days ago 11/23/2021 FINDINGS: Left-sided pacemaker  remains in place. Stable heart size and mediastinal contours. Aortic atherosclerosis. Increasing left lung base atelectasis with  slight blunting of costophrenic angle. Biapical pleuroparenchymal scarring. No pulmonary edema. No pneumothorax. Postsurgical change right proximal humerus. Chronic deformity left proximal humerus. IMPRESSION: Increasing left lung base atelectasis. Possible small left pleural effusion. Electronically Signed   By: Narda RutherfordMelanie  Sanford M.D.   On: 11/29/2021 21:35   CT Angio Chest PE W and/or Wo Contrast  Result Date: 11/29/2021 CLINICAL DATA:  Concern for pulmonary embolism. EXAM: CT ANGIOGRAPHY CHEST WITH CONTRAST TECHNIQUE: Multidetector CT imaging of the chest was performed using the standard protocol during bolus administration of intravenous contrast. Multiplanar CT image reconstructions and MIPs were obtained to evaluate the vascular anatomy. RADIATION DOSE REDUCTION: This exam was performed according to the departmental dose-optimization program which includes automated exposure control, adjustment of the mA and/or kV according to patient size and/or use of iterative reconstruction technique. CONTRAST:  80mL OMNIPAQUE IOHEXOL 350 MG/ML SOLN COMPARISON:  Chest CT dated 11/23/2021. FINDINGS: Evaluation of this exam is limited due to respiratory motion artifact. Cardiovascular: There is no cardiomegaly or pericardial effusion. There is coronary vascular calcification. Left pectoral pacemaker device. Mild atherosclerotic calcification of the thoracic aorta. No aneurysmal dilatation or dissection. Evaluation of the pulmonary arteries is limited due to respiratory motion artifact. Are no large or central pulmonary artery embolus identified. Mediastinum/Nodes: No hilar or mediastinal adenopathy. Small hiatal hernia. The esophagus is grossly unremarkable. No mediastinal fluid collection. Lungs/Pleura: Background of emphysema. Bibasilar dependent atelectasis. Pneumonia is not excluded. No pleural effusion or pneumothorax. The central airways are patent. Upper Abdomen: No acute abnormality. Musculoskeletal: Osteopenia  with degenerative changes of the spine. Right shoulder fixation plate and screws. No acute osseous pathology. Old right posterior rib fractures. Review of the MIP images confirms the above findings. IMPRESSION: 1. No CT evidence of central pulmonary artery embolus. 2. Bibasilar dependent atelectasis/scarring. Pneumonia is not excluded. 3. Aortic Atherosclerosis (ICD10-I70.0) and Emphysema (ICD10-J43.9). Electronically Signed   By: Elgie CollardArash  Radparvar M.D.   On: 11/29/2021 22:50    EKG: Independently reviewed. Sinus rhythm, 1st degree AV block, non-specific IVCD with repolarization abnormality.   Assessment/Plan   1. Acute COPD exacerbation; chronic respiratory failure  - Pt with COPD and chronic hypoxic respiratory failure presents with increased SOB, increased cough, and hypotension  - She was started on steroids and treated for possible HCAP in ED  - Check MRSA pcr and procalcitonin, check sputum culture, continue systemic steroids, continue antibiotics and deescalate based on MRSA pcr and procalcitonin   2. Hypotension  - SBP 70s at ALF and again in ED, improved to low 100s with a liter of NS  - Lactic acid is normal in ED and there is no fever, chest pain, or bleeding  - Hold Lasix, give additional IVF if needed    3. AKI superimposed on CKD IIIa  - SCr is 1.29 on admission, up from baseline of 0.9   - Likely mild acute prerenal azotemia in setting of hypotension  - Hold Lasix, renally-dose medications, monitor   4. Chronic diastolic CHF  - Appears compensated  - Hold Lasix initially in light of hypotension and AKI    5. Anxiety, depression, memory loss  - Continue Seroquel, Remeron, Wellbutrin, delirium precautions    6. Hx of TIA  - Continue Plavix    7. Hypothyroidism  - Continue Synthroid     DVT prophylaxis: sq heparin  Code Status: DNR, documentation at bedside  Level of Care:  Level of care: Progressive Family Communication: none available  Disposition Plan:  Patient is  from: ALF  Anticipated d/c is to: TBD Anticipated d/c date is: 12/02/21  Patient currently: pending stable BP, improved respiratory status  Consults called: none  Admission status: Inpatient     Briscoe Deutscher, MD [facility]  11/30/2021, 3:36 AM

## 2021-11-30 NOTE — Assessment & Plan Note (Addendum)
Dehydrated on admission. Resolved. cont Lasix

## 2021-11-30 NOTE — Progress Notes (Signed)
Scheduled neb tx given at 1400. MAR is Read Only- RN aware. Sp02 on 4 LPM 96% (decreased to 3 LPM (3-4 LPM Dep)), HR 68, BBS diminished (exp whz in throat). PT tolerated tx well.

## 2021-11-30 NOTE — Assessment & Plan Note (Signed)
TIA history for which she is on Plavix.

## 2021-11-30 NOTE — Assessment & Plan Note (Addendum)
AECOPD Chronic hypoxic respiratory failure on 3 to 4 [facility]: having dyspnea/SOB although oxygen requirement has been stable, with dry cough and wheezing.wheezing improved- cont po steroid and taper off at [facility] . CONT PO doxycycline. He Procalcitonin reassuring,  afebrile given the bump in WBC count I feel its mostly reactive from steroid. Placed on low dose oxy to help with anxiety/pain by PMT Recent Labs  Lab 11/27/21 0356 11/29/21 2140 11/30/21 0600 12/01/21 0640 12/02/21 0401  WBC 17.9* 17.4* 15.8* 20.4* 21.7*  LATICACIDVEN  --  1.5  --   --   --   PROCALCITON  --   --  <0.10  --   --

## 2021-11-30 NOTE — Assessment & Plan Note (Addendum)
Appreciate palliative care input.  [facility] was planning to meet with the patient and patient's family at assisted living facility to enroll into hospice care.plan to discharge back to facility with hospice.

## 2021-11-30 NOTE — ED Notes (Signed)
Patient's daughter updated over the phone.

## 2021-12-01 DIAGNOSIS — J441 Chronic obstructive pulmonary disease with (acute) exacerbation: Secondary | ICD-10-CM | POA: Diagnosis not present

## 2021-12-01 DIAGNOSIS — E873 Alkalosis: Secondary | ICD-10-CM

## 2021-12-01 DIAGNOSIS — R197 Diarrhea, unspecified: Secondary | ICD-10-CM

## 2021-12-01 DIAGNOSIS — I5032 Chronic diastolic (congestive) heart failure: Secondary | ICD-10-CM | POA: Diagnosis not present

## 2021-12-01 DIAGNOSIS — Z7189 Other specified counseling: Secondary | ICD-10-CM | POA: Diagnosis not present

## 2021-12-01 DIAGNOSIS — Z515 Encounter for palliative care: Secondary | ICD-10-CM | POA: Diagnosis not present

## 2021-12-01 LAB — BASIC METABOLIC PANEL
Anion gap: 8 (ref 5–15)
BUN: 23 mg/dL (ref 8–23)
CO2: 19 mmol/L — ABNORMAL LOW (ref 22–32)
Calcium: 9.1 mg/dL (ref 8.9–10.3)
Chloride: 104 mmol/L (ref 98–111)
Creatinine, Ser: 0.7 mg/dL (ref 0.44–1.00)
GFR, Estimated: >60 mL/min (ref >60)
Glucose, Bld: 122 mg/dL — ABNORMAL HIGH (ref 70–99)
Potassium: 4.5 mmol/L (ref 3.5–5.1)
Sodium: 131 mmol/L — ABNORMAL LOW (ref 135–145)

## 2021-12-01 LAB — CBC
HCT: 43.1 % (ref 36.0–46.0)
Hemoglobin: 13.5 g/dL (ref 12.0–15.0)
MCH: 28.8 pg (ref 26.0–34.0)
MCHC: 31.3 g/dL (ref 30.0–36.0)
MCV: 91.9 fL (ref 80.0–100.0)
Platelets: 255 10*3/uL (ref 150–400)
RBC: 4.69 MIL/uL (ref 3.87–5.11)
RDW: 14.4 % (ref 11.5–15.5)
WBC: 20.4 10*3/uL — ABNORMAL HIGH (ref 4.0–10.5)
nRBC: 0 % (ref 0.0–0.2)

## 2021-12-01 MED ORDER — GUAIFENESIN ER 600 MG PO TB12
1200.0000 mg | ORAL_TABLET | Freq: Two times a day (BID) | ORAL | Status: DC
  Administered 2021-12-01 – 2021-12-03 (×4): 1200 mg via ORAL
  Filled 2021-12-01 (×4): qty 2

## 2021-12-01 MED ORDER — DOXYCYCLINE HYCLATE 100 MG PO TABS
100.0000 mg | ORAL_TABLET | Freq: Two times a day (BID) | ORAL | Status: DC
  Administered 2021-12-02 – 2021-12-03 (×3): 100 mg via ORAL
  Filled 2021-12-01 (×3): qty 1

## 2021-12-01 MED ORDER — OXYCODONE HCL 5 MG/5ML PO SOLN
2.0000 mg | ORAL | Status: DC | PRN
Start: 2021-12-01 — End: 2021-12-03
  Administered 2021-12-01 – 2021-12-02 (×3): 2 mg via ORAL
  Filled 2021-12-01 (×3): qty 5

## 2021-12-01 MED ORDER — PREDNISONE 20 MG PO TABS: 40.0000 mg | ORAL_TABLET | Freq: Every day | ORAL | Status: DC

## 2021-12-01 NOTE — Assessment & Plan Note (Addendum)
Monitor

## 2021-12-01 NOTE — Consult Note (Signed)
Consultation Note Date: 12/01/2021   Patient Name: Sheila Mccoy  DOB: [birth year]  MRN: [id number]  Age / Sex: 88 y.o., female  PCP: Pcp, No Referring Physician: Antonieta Pert, MD  Reason for Consultation: Establishing goals of care  HPI/Patient Profile: 88 y.o. female  with past medical history of COPD, chronic hypoxic respiratory failure, chronic diastolic heart failure, hypothyroidism, CKD, depression, anxiety, history of TIA admitted on 11/29/2021 with shortness of breath and hypotension.  She was recently admitted for COPD exacerbation and transition back to her assisted living facility.  Since then, she has continued to have issues with shortness of breath and chronic cough, however this acutely worsened today and she was brought to the ED and is currently being treated for COPD exacerbation.   Clinical Assessment and Goals of Care: I met today with Sheila Mccoy and her daughter, Sheila Mccoy.   I introduced palliative care as specialized medical care for people living with serious illness. It focuses on providing relief from the symptoms and stress of a serious illness. The goal is to improve quality of life for both the patient and the family.  They are familiar with palliative care as she is followed as an outpatient and was actually planning to elect hospice benefits at her assisted living facility but acutely worsened and was sent to the hospital.  We discussed clinical course as well as wishes moving forward in regard to advanced directives.  Concepts specific to code status and rehospitalization discussed.  We discussed difference between a aggressive medical intervention path and a palliative, comfort focused care path.  Values and goals of care important to patient and family were attempted to be elicited.   Goal remains to add as much time and quality to her life as possible.  Her daughter hopes that she  stabilizes and enjoys better quality of life with appropriate symptom management through hospice.  They do understand she has life limiting illness and we discussed how it is often the case that people with hospice services actually live better and longer than those without due to appropriate symptom management which allows better quality of life and functional status.   Questions and concerns addressed.   PMT will continue to support holistically.   SUMMARY OF RECOMMENDATIONS   -DNR/DNI -Reviewed medications in detail with patient's daughter.  She was concerned with ensuring that she is on her home medications such as mirtazapine and Seroquel.  She also wanted to ensure she was not getting medications that have caused her adverse reactions in the past (discontinued Flexeril at her request).   -Shortness of breath: Discussed addition of low-dose opioid to see if this helps with perceived shortness of breath and functional status.  She does not do well with morphine nor does she do well with Dilaudid.  Therefore made addition of oxycodone 2.5 mg every 4 hours as needed for shortness of breath.  She does not have symptoms at night as long as she gets her mirtazapine and would avoid giving opioids at night in conjunction with  mirtazapine to avoid oversedation. -Overall, goal is to medically maximize her during hospitalization and transition back to assisted living facility with hospice support.  They were actually on scheduled to be enrolled with [facility] [place] on the day she was admitted.  They would like to work with [facility] [place] at time of discharge.  Referral placed to TOC.  Code Status/Advance Care Planning: DNR  Symptom Management:  Shortness of breath: Trial of oxycodone 2.5 mg every 4 hours as needed.  Palliative Prophylaxis:  Bowel Regimen and Frequent Pain Assessment  Additional Recommendations (Limitations, Scope, Preferences): Avoid  Hospitalization  Psycho-social/Spiritual:  Desire for further Chaplaincy support: Not addressed today Additional Recommendations: Education on Hospice  Prognosis:  < 6 months  Discharge Planning:  Assisted living with hospice when she is medically maximized and ready for discharge       Primary Diagnoses: Present on Admission:  Hypotension  Chronic respiratory failure with hypoxia (HCC)  Hypothyroidism  COPD with acute exacerbation (HCC)  Chronic diastolic CHF (congestive heart failure) ([place])  Acute renal failure superimposed on stage 3a chronic kidney disease ([place])  TIA (transient ischemic attack)   I have reviewed the medical record, interviewed the patient and family, and examined the patient. The following aspects are pertinent.  Past Medical History:  Diagnosis Date   Acute metabolic encephalopathy 46/50/3546   Adnexal cyst 07/09/2020   Anxiety    Artificial pacemaker    Bipolar disorder ([place])    COPD (chronic obstructive pulmonary disease) (HCC)    Depression    History of CT scan 2021   Legs.   Hypothyroidism 09/30/2020   Memory difficulty 01/02/2021   Sacral fracture ([place]) 07/09/2020   TIA (transient ischemic attack) 09/29/2020   UTI (urinary tract infection) 07/09/2020   Social History   Socioeconomic History   Marital status: Divorced    Spouse name: Not on file   Number of children: 8   Years of education: Not on file   Highest education level: Associate degree: academic program  Occupational History   Not on file  Tobacco Use   Smoking status: Never   Smokeless tobacco: Never  Vaping Use   Vaping Use: Never used  Substance and Sexual Activity   Alcohol use: Never   Drug use: Never   Sexual activity: Not on file  Other Topics Concern   Not on file  Social History Narrative   01/02/21 lives in [place] [facility],   Right Handed   Drinks no caffeine   Social Determinants of Health   Financial Resource Strain: Not  on file  Food Insecurity: Not on file  Transportation Needs: Not on file  Physical Activity: Not on file  Stress: Not on file  Social Connections: Not on file   Family History  Problem Relation Age of Onset   Lung cancer Mother    Cancer Sister    Cancer Brother    Cancer Sister    Sarcoidosis Brother    Leukemia Daughter    Scheduled Meds:  artificial tears  1 application Both Eyes TID   buPROPion  75 mg Oral Daily   clopidogrel  75 mg Oral Daily   dorzolamide-timolol  1 drop Both Eyes BID   guaiFENesin  600 mg Oral BID   heparin  5,000 Units Subcutaneous Q8H   ipratropium-albuterol  3 mL Nebulization TID   latanoprost  1 drop Both Eyes QHS   levothyroxine  68.5 mcg Oral Q0600   methylPREDNISolone (  SOLU-MEDROL) injection  40 mg Intravenous Q12H   mirtazapine  15 mg Oral QHS   Netarsudil Dimesylate  1 drop Both Eyes QHS   pantoprazole  40 mg Oral Daily   pentosan polysulfate  100 mg Oral TID   QUEtiapine  25 mg Oral QHS   sodium chloride flush  3 mL Intravenous Q12H   Continuous Infusions:  azithromycin 500 mg (12/01/21 0422)   cefTRIAXone (ROCEPHIN)  IV Stopped (11/30/21 1009)   lactated ringers 50 mL/hr at 11/30/21 1847   PRN Meds:.acetaminophen **OR** acetaminophen, albuterol, diclofenac Sodium, famotidine, oxyCODONE, polyethylene glycol Medications Prior to Admission:  Prior to Admission medications   Medication Sig Start Date End Date Taking? Authorizing Provider  acetaminophen (TYLENOL) 500 MG tablet Take 1,000 mg by mouth every 6 (six) hours as needed for mild pain.   Yes [provider]  albuterol (PROVENTIL) (2.5 MG/3ML) 0.083% nebulizer solution Take 3 mLs (2.5 mg total) by nebulization every 4 (four) hours as needed for shortness of breath or wheezing. Patient taking differently: Take 2.5 mg by nebulization every 4 (four) hours as needed for shortness of breath. 11/27/21  Yes Bonnielee Haff, MD  albuterol (VENTOLIN HFA) 108 (90 Base) MCG/ACT inhaler  Inhale 1-2 puffs into the lungs every 6 (six) hours as needed for wheezing or shortness of breath. Patient taking differently: Inhale 2 puffs into the lungs every 6 (six) hours as needed for wheezing or shortness of breath. 11/27/21  Yes Bonnielee Haff, MD  BREO ELLIPTA 100-25 MCG/ACT AEPB Inhale 1 puff into the lungs daily. 11/20/21  Yes [provider]  buPROPion (WELLBUTRIN) 75 MG tablet Take 75 mg by mouth daily.  06/13/20  Yes [provider]  cephALEXin (KEFLEX) 500 MG capsule Take 1 capsule (500 mg total) by mouth every 8 (eight) hours for 4 days. 11/27/21 12/01/21 Yes Bonnielee Haff, MD  Cholecalciferol (VITAMIN D3) 25 MCG (1000 UT) CAPS Take 1 capsule (1,000 Units total) by mouth 2 (two) times daily. 11/27/21  Yes Bonnielee Haff, MD  clopidogrel (PLAVIX) 75 MG tablet Take 1 tablet (75 mg total) by mouth daily. 12/28/20  Yes Ngetich, Dinah C, NP  Coenzyme Q10 (CO Q 10) 60 MG CAPS Take 120 mg by mouth in the morning and at bedtime.   Yes [provider]  COSOPT 22.3-6.8 MG/ML ophthalmic solution 1 drop 2 (two) times daily.   Yes [provider]  cyanocobalamin 1000 MCG tablet Take 1,000 mcg by mouth daily.   Yes [provider]  cyclobenzaprine (FLEXERIL) 10 MG tablet Take 1 tablet (10 mg total) by mouth 3 (three) times daily. Use with caution due to fall risk Patient taking differently: Take 10 mg by mouth daily as needed for muscle spasms (Use with caution due to fall risk). 02/09/21  Yes Ngetich, Dinah C, NP  diclofenac Sodium (VOLTAREN) 1 % GEL Apply 2 g topically every 6 (six) hours as needed (for back pain).   Yes [provider]  doxycycline (VIBRA-TABS) 100 MG tablet Take 1 tablet (100 mg total) by mouth every 12 (twelve) hours for 4 days. 11/27/21 12/01/21 Yes Bonnielee Haff, MD  famotidine (PEPCID) 10 MG tablet Take 10 mg by mouth every 12 (twelve) hours as needed for heartburn.   Yes [provider]  fluticasone (FLONASE) 50  MCG/ACT nasal spray Place 1 spray into both nostrils daily. Patient taking differently: Place 2 sprays into both nostrils daily as needed (for congestion). 01/18/21  Yes Mercy Riding, MD  furosemide (LASIX) 20  MG tablet Take 20 mg by mouth every other day.   Yes [provider]  guaiFENesin (MUCINEX) 600 MG 12 hr tablet Take 1 tablet (600 mg total) by mouth 2 (two) times daily. 02/09/21  Yes Ngetich, Dinah C, NP  latanoprost (XALATAN) 0.005 % ophthalmic solution Place 1 drop into both eyes at bedtime.   Yes [provider]  loperamide (IMODIUM A-D) 2 MG tablet Take 2 mg by mouth every 8 (eight) hours as needed (for diarrhea). 07/23/21  Yes [provider]  loratadine (CLARITIN) 10 MG tablet Take 1 tablet (10 mg total) by mouth daily. 01/19/21  Yes Mercy Riding, MD  magnesium oxide (MAG-OX) 400 MG tablet Take 400 mg by mouth daily.   Yes [provider]  Menthol, Topical Analgesic, (BIOFREEZE) 4 % GEL Apply 1 application topically every 12 (twelve) hours as needed (for right shoulder pain).   Yes [provider]  mirtazapine (REMERON) 15 MG tablet Take 1 tablet (15 mg total) by mouth at bedtime. 02/09/21  Yes Ngetich, Dinah C, NP  OXYGEN Inhale 3-4 L/min into the lungs continuous. Inhale 3 L/min of oxygen into the lungs continuously and increase to 4 L/min when ambulating or sats drop below 90%   Yes [provider]  pantoprazole (PROTONIX) 40 MG tablet Take 40 mg by mouth daily. 11/01/20  Yes [provider]  polyethylene glycol (MIRALAX / GLYCOLAX) 17 g packet Take 17 g by mouth 2 (two) times daily as needed for mild constipation. Patient taking differently: Take 17 g by mouth daily as needed for mild constipation. 01/18/21  Yes Mercy Riding, MD  predniSONE (DELTASONE) 20 MG tablet Take 3 tablets once daily for 3 days followed by 2 tablets once daily for 3 days followed by 1 tablet once daily for 3 days and then stop Patient taking  differently: Take 20-60 mg by mouth See admin instructions. Starting on 11/28/2021, take 60 mg by mouth once a day for 3 days, 40 mg once a day for 3 days, 20 mg once a day for 3 days, then stop 11/27/21  Yes Bonnielee Haff, MD  QUEtiapine (SEROQUEL) 25 MG tablet Take 1 tablet (25 mg total) by mouth at bedtime. 03/28/21  Yes Kathrynn Ducking, MD  RHOPRESSA 0.02 % SOLN Place 1 drop into both eyes at bedtime. 11/18/21  Yes [provider]  SYNTHROID 137 MCG tablet Take 68.5 mcg by mouth daily before breakfast.   Yes [provider]  SYSTANE 0.4-0.3 % GEL ophthalmic gel Place 1 application into both eyes 3 (three) times daily.   Yes [provider]  SYSTANE 0.4-0.3 % SOLN Place 1 drop into both eyes 4 (four) times daily.   Yes [provider]   Allergies  Allergen Reactions   Atorvastatin Other (See Comments)    Muscle paralysis and unable to use the legs   Nitrofurantoin Other (See Comments)    PT reports possible allergy -- weakness, tiredness when taking    Rosuvastatin Other (See Comments)    Lower extremity weakness, gait instability and confusion   Hydromorphone Other (See Comments)    Very Disoriented & Confused   Morphine And Related Other (See Comments)    Catatonic   Morphine Other (See Comments)    Confusion     Tramadol Other (See Comments)    Causes confusion    Review of Systems  Constitutional:  Positive for activity change.  Respiratory:  Positive for cough and shortness of breath.  Neurological:  Positive for weakness.  Psychiatric/Behavioral:  Positive for sleep disturbance.    Physical Exam General: Alert, awake, in no acute distress.   Lungs: Good air movement, clear Abdomen: Soft, nondistended Ext: No significant edema Skin: Warm and dry Neuro: Grossly intact, nonfocal.   Vital Signs: BP (!) 158/71    Pulse (!) 59    Temp 97.7 F (36.5 C) (Oral)    Resp 16    Ht 5' 3"  (1.6 m)    Wt 65.8 kg    SpO2 97%    BMI 25.69 kg/m   Pain Scale: 0-10   Pain Score: 0-No pain   SpO2: SpO2: 97 % O2 Device:SpO2: 97 % O2 Flow Rate: .O2 Flow Rate (L/min): 4 L/min  IO: Intake/output summary:  Intake/Output Summary (Last 24 hours) at 12/01/2021 0851 Last data filed at 12/01/2021 0540 Gross per 24 hour  Intake 571.64 ml  Output 450 ml  Net 121.64 ml    LBM: Last BM Date : 11/30/21 Baseline Weight: Weight: 65.8 kg Most recent weight: Weight: 65.8 kg     Palliative Assessment/Data:   Flowsheet Rows    Flowsheet Row Most Recent Value  Intake Tab   Referral Department Hospitalist  Unit at Time of Referral Med/Surg Unit  Palliative Care Primary Diagnosis Pulmonary  Date Notified 11/30/21  Palliative Care Type New Palliative care  Reason for referral Clarify Goals of Care  Date of Admission 11/29/21  Date first seen by Palliative Care 11/30/21  # of days Palliative referral response time 0 Day(s)  # of days IP prior to Palliative referral 1  Clinical Assessment   Palliative Performance Scale Score 40%  Psychosocial & Spiritual Assessment   Palliative Care Outcomes   Patient/Family meeting held? Yes  Who was at the meeting? Patient, daughter       Time In: 49 Time Out: 1700 Time Total: 80 Greater than 50%  of this time was spent counseling and coordinating care related to the above assessment and plan.  Signed by: Micheline Rough, MD   Please contact Palliative Medicine Team phone at [phone] for questions and concerns.  For individual provider: See Shea Evans

## 2021-12-01 NOTE — Progress Notes (Signed)
PROGRESS NOTE Sheila Mccoy  [id number] DOB: 04/24/1931 DOA: 11/29/2021 PCP: Pcp, No   Brief Narrative/Hospital Course: 88 y.o. female with medical history significant for COPD, chronic hypoxic respiratory failure, chronic diastolic CHF, hypothyroidism, CKD 3A, depression, anxiety, and history of TIA, now presenting to the emergency department for evaluation of shortness of breath and hypotension.   she had worsening shortness of breath since the recent hospital discharge, becoming severe just prior to her presentation to the ED and was found to be hypotensive with systolic pressure in the [id number] at her ALF.she reports that her productive cough is chronic but worsening over the past couple days. In ED-afebrile, saturating 90s on 4 L/min of supplemental oxygen, tachypneic, and with initial blood pressure 73 systolic.  CTA chest is negative for PE.Chemistry panel notable for creatinine 1.29.  CBC with stable leukocytosis and mild thrombocytosis.  Lactic acid was normal.  CT angio chest no PE, bibasilar dependent atelectasis/scarring, pneumonia not excluded, aortic atherosclerosis and emphysema , chest x-ray with increasing left lung base atelectasis possible left pleural effusion.Blood cultures collected and the patient was treated with IV Solu-Medrol, cefepime, vancomycin, Pulmicort, and IV fluids and she is being admitted for acute Ciprodex medicine with chronic respiratory failure, hypotension AKI on CKD stage III, chronic diastolic CHF anxiety TIA and hypothyroidism. Patient has clinically improved with his steroids supplemental oxygen and bronchodilators. Patient had episodes of diarrhea GI panel/C. difficile was ordered 2/24 given recent antibiotic use and leukocytosis.    Subjective:  Seen and examined.  She reports overall she is feeling much better no shortness of breath this morning  Had diarrhea yesterday but none this morning Overnight remains on 4 L nasal cannula, WBC bumped up but no fever  and is on IV steroid.  Assessment and Plan: * COPD with acute exacerbation ([place])- (present on admission) AECOPD Chronic hypoxic respiratory failure on 3 to 4 L [place] atALF: having dyspnea/SOB although oxygen requirement has been stable, with dry cough and wheezing.continue with iv steroid. Off vanco as procal neg, will dc rocephin and cont azithro only, elevated WBC count but afebrile suspected from steroid. Blood Cx ngtd.Continue bronchodilators, I-S, OOB, obtain PMT consult as she was being enrolled into hospice at ALF. Recent Labs  Lab 11/25/21 0234 11/27/21 0356 11/29/21 2140 11/30/21 0600 12/01/21 0640  WBC 17.4* 17.9* 17.4* 15.8* 20.4*  LATICACIDVEN  --   --  1.5  --   --   PROCALCITON  --   --   --  <0.10  --     Chronic respiratory failure with hypoxia ([place])- (present on admission) On 3 to nasal cannula at rest and 4 L on ambulation.  Stable  Acute renal failure superimposed on stage 3a chronic kidney disease ([place])- (present on admission) From hypotension and prerenal.resolved, encourage continue to hold Lasix  Recent Labs  Lab 11/25/21 0234 11/27/21 0356 11/29/21 2140 11/30/21 0600 12/01/21 0640  BUN 22 34* 50* 36* 23  CREATININE 0.93 1.08* 1.29* 0.79 0.70    Hypotension- (present on admission) Improved with IV fluids. Holding Lasix.Encourage oral intake.  Chronic diastolic CHF (congestive heart failure) ([place])- (present on admission) Dehydrated on admission, will continue to hold Lasix, monitor fluid status closely. Check bnp.Net IO Since Admission: 871.64 mL [12/01/21 1001]  Filed Weights   11/29/21 2037  Weight: [id number] kg    Metabolic alkalosis Mild, monitor.  Diarrhea in adult patient Patient had diarrhea episodes 2/24 C diff/GI panel ordered given recent antibiotic use leukocytosis. No recurrence of diarrhea now-continue supportive  care. On enteric precautions for now.  Goals of care, counseling/discussion [facility] was planning to meet with the  patient and patient's family at assisted living facility to enroll into hospice care.given patient's respiratory failure emphysema COPD she will benefit PMT discussion.Continue DNR.    Hyponatremia- (present on admission) Mild, likely 2/2 diarrhea, decreased po.  Hypothyroidism- (present on admission) Continue home Synthroid  TIA (transient ischemic attack)- (present on admission) TIA history for which she is on Plavix.  DVT prophylaxis: heparin injection 5,000 Units Start: 11/30/21 0600 Code Status:   Code Status: DNR Family Communication: plan of care discussed with patient at bedside.  Called and updated daughter on the phone 2/24  Disposition: Currently not  medically stable for discharge. Status is: Inpatient Remains inpatient appropriate because: For ongoing management of COPD exacerbation dyspnea/ diarrhea.  Objective: Vitals last 24 hrs: Vitals:   11/30/21 2346 12/01/21 0000 12/01/21 0400 12/01/21 0641  BP: (!) 154/78   (!) 158/71  Pulse: (!) 59   (!) 59  Resp: 16 13 14 16   Temp: 98 F (36.7 C)   97.7 F (36.5 C)  TempSrc:    Oral  SpO2: 100%  100% 97%  Weight:      Height:       Weight change:   Physical Examination: General exam: AA oriented, elderly, frail, older than stated age, weak appearing. HEENT:Oral mucosa moist, Ear/Nose WNL grossly, dentition normal. Respiratory system: bilaterally diminished breath sounds with minimal wheezing no use of accessory muscle Cardiovascular system: S1 & S2 +, No JVD,. Gastrointestinal system: Abdomen soft,NT,ND,BS+ Nervous System:Alert, awake, moving extremities and grossly nonfocal Extremities: LE ankle edema none, distal peripheral pulses palpable.  Skin: No rashes,no icterus. MSK: Normal muscle bulk,tone, power   Medications reviewed: Scheduled Meds:  artificial tears  1 application Both Eyes TID   buPROPion  75 mg Oral Daily   clopidogrel  75 mg Oral Daily   dorzolamide-timolol  1 drop Both Eyes BID   guaiFENesin   600 mg Oral BID   heparin  5,000 Units Subcutaneous Q8H   ipratropium-albuterol  3 mL Nebulization TID   latanoprost  1 drop Both Eyes QHS   levothyroxine  68.5 mcg Oral Q0600   methylPREDNISolone (SOLU-MEDROL) injection  40 mg Intravenous Q12H   mirtazapine  15 mg Oral QHS   Netarsudil Dimesylate  1 drop Both Eyes QHS   pantoprazole  40 mg Oral Daily   pentosan polysulfate  100 mg Oral TID   QUEtiapine  25 mg Oral QHS   sodium chloride flush  3 mL Intravenous Q12H  Continuous Infusions:  azithromycin 500 mg (12/01/21 0422)   lactated ringers 50 mL/hr at 11/30/21 1847     Diet Order             Diet regular Room service appropriate? Yes; Fluid consistency: Thin  Diet effective now                   Intake/Output Summary (Last 24 hours) at 12/01/2021 1134 Last data filed at 12/01/2021 1000 Gross per 24 hour  Intake 931.64 ml  Output 450 ml  Net 481.64 ml   Net IO Since Admission: 1,231.64 mL [12/01/21 1134]  Wt Readings from Last 3 Encounters:  11/29/21 65.8 kg  11/22/21 64.9 kg  03/28/21 64.9 kg     Unresulted Labs (From admission, onward)     Start     Ordered   11/30/21 1815  C Difficile Quick Screen w PCR reflex  (  C Difficile quick screen w PCR reflex panel )  Once, for 24 hours,   TIMED       References:    CDiff Information Tool   11/30/21 1814   11/30/21 1814  Gastrointestinal Panel by PCR , Stool  (Gastrointestinal Panel by PCR, Stool                                                                                                                                                     **Does Not include CLOSTRIDIUM DIFFICILE testing. **If CDIFF testing is needed, place order from the "C Difficile Testing" order set.**)  Once,   R        11/30/21 1814   11/30/21 0500  Basic metabolic panel  Daily,   R      11/30/21 0334   11/30/21 0500  CBC  Daily,   R      11/30/21 0334   11/30/21 0336  Urea nitrogen, urine  Once,   R        11/30/21 0335   11/30/21 0333   Expectorated Sputum Assessment w Gram Stain, Rflx to Resp Cult  Once,   R        11/30/21 0334   11/29/21 2048  Culture, blood (Routine x 2)  BLOOD CULTURE X 2,   STAT      11/29/21 2048          Data Reviewed: I have personally reviewed following labs and imaging studies CBC: Recent Labs  Lab 11/25/21 0234 11/27/21 0356 11/29/21 2140 11/30/21 0600 12/01/21 0640  WBC 17.4* 17.9* 17.4* 15.8* 20.4*  NEUTROABS  --   --  13.5*  --   --   HGB 13.4 13.6 14.7 14.2 13.5  HCT 40.5 41.2 45.3 43.3 43.1  MCV 87.3 87.5 89.7 88.5 91.9  PLT 360 394 432* 375 255   Basic Metabolic Panel: Recent Labs  Lab 11/25/21 0234 11/27/21 0356 11/29/21 2140 11/30/21 0600 12/01/21 0640  NA 136 135 136 136 131*  K 4.0 4.4 4.1 4.3 4.5  CL 103 101 103 108 104  CO2 23 26 26  21* 19*  GLUCOSE 99 95 114* 139* 122*  BUN 22 34* 50* 36* 23  CREATININE 0.93 1.08* 1.29* 0.79 0.70  CALCIUM 8.8* 9.3 9.3 8.6* 9.1   GFR: Estimated Creatinine Clearance: 44.4 mL/min (by C-G formula based on SCr of 0.7 mg/dL). Liver Function Tests: Recent Labs  Lab 11/29/21 2140  AST 19  ALT 28  ALKPHOS 99  BILITOT 0.6  PROT 7.2  ALBUMIN 3.7   No results for input(s): LIPASE, AMYLASE in the last 168 hours. No results for input(s): AMMONIA in the last 168 hours. Coagulation Profile: Recent Labs  Lab 11/29/21 2140  INR 1.0   Cardiac Enzymes: No results for input(s): CKTOTAL, CKMB, CKMBINDEX, TROPONINI in the  last 168 hours. BNP (last 3 results) No results for input(s): PROBNP in the last 8760 hours. HbA1C: No results for input(s): HGBA1C in the last 72 hours. CBG: Recent Labs  Lab 11/26/21 1138  GLUCAP 193*   Lipid Profile: No results for input(s): CHOL, HDL, LDLCALC, TRIG, CHOLHDL, LDLDIRECT in the last 72 hours. Thyroid Function Tests: No results for input(s): TSH, T4TOTAL, FREET4, T3FREE, THYROIDAB in the last 72 hours. Anemia Panel: No results for input(s): VITAMINB12, FOLATE, FERRITIN, TIBC, IRON,  RETICCTPCT in the last 72 hours. Sepsis Labs: Recent Labs  Lab 11/29/21 2140 11/30/21 0600  PROCALCITON  --  <0.10  LATICACIDVEN 1.5  --     Recent Results (from the past 240 hour(s))  Resp Panel by RT-PCR (Flu A&B, Covid) Nasopharyngeal Swab     Status: None   Collection Time: 11/23/21  8:40 AM   Specimen: Nasopharyngeal Swab; Nasopharyngeal(NP) swabs in vial transport medium  Result Value Ref Range Status   SARS Coronavirus 2 by RT PCR NEGATIVE NEGATIVE Final    Comment: (NOTE) SARS-CoV-2 target nucleic acids are NOT DETECTED.  The SARS-CoV-2 RNA is generally detectable in upper respiratory specimens during the acute phase of infection. The lowest concentration of SARS-CoV-2 viral copies this assay can detect is 138 copies/mL. A negative result does not preclude SARS-Cov-2 infection and should not be used as the sole basis for treatment or other patient management decisions. A negative result may occur with  improper specimen collection/handling, submission of specimen other than nasopharyngeal swab, presence of viral mutation(s) within the areas targeted by this assay, and inadequate number of viral copies(<138 copies/mL). A negative result must be combined with clinical observations, patient history, and epidemiological information. The expected result is Negative.  Fact Sheet for Patients:  EntrepreneurPulse.com.au  Fact Sheet for Healthcare Providers:  IncredibleEmployment.be  This test is no t yet approved or cleared by the [place] FDA and  has been authorized for detection and/or diagnosis of SARS-CoV-2 by FDA under an Emergency Use Authorization (EUA). This EUA will remain  in effect (meaning this test can be used) for the duration of the COVID-19 declaration under Section 564(b)(1) of the Act, 21 U.S.C.section 360bbb-3(b)(1), unless the authorization is terminated  or revoked sooner.       Influenza A by PCR NEGATIVE  NEGATIVE Final   Influenza B by PCR NEGATIVE NEGATIVE Final    Comment: (NOTE) The Xpert Xpress SARS-CoV-2/FLU/RSV plus assay is intended as an aid in the diagnosis of influenza from Nasopharyngeal swab specimens and should not be used as a sole basis for treatment. Nasal washings and aspirates are unacceptable for Xpert Xpress SARS-CoV-2/FLU/RSV testing.  Fact Sheet for Patients: EntrepreneurPulse.com.au  Fact Sheet for Healthcare Providers: IncredibleEmployment.be  This test is not yet approved or cleared by the [place] FDA and has been authorized for detection and/or diagnosis of SARS-CoV-2 by FDA under an Emergency Use Authorization (EUA). This EUA will remain in effect (meaning this test can be used) for the duration of the COVID-19 declaration under Section 564(b)(1) of the Act, 21 U.S.C. section 360bbb-3(b)(1), unless the authorization is terminated or revoked.  Performed at [facility], [address] [place], [place] [address]   Culture, blood (Routine x 2)     Status: None (Preliminary result)   Collection Time: 11/29/21  9:40 PM   Specimen: BLOOD  Result Value Ref Range Status   Specimen Description   Final    BLOOD RIGHT ANTECUBITAL Performed at [facility], [place] [address], [place], [place] [address]    Special Requests   Final    BOTTLES DRAWN AEROBIC AND ANAEROBIC Blood Culture adequate volume Performed at [facility] [address], [place], [place] [address]    Culture   Final    NO GROWTH 2 DAYS Performed at [facility] [address] [place], [place] [address]    Report Status PENDING  Incomplete  Resp Panel by RT-PCR (Flu A&B, Covid) Nasopharyngeal Swab     Status: None   Collection Time: 11/29/21 10:00 PM   Specimen: Nasopharyngeal Swab; Nasopharyngeal(NP) swabs in vial transport medium  Result Value Ref Range Status   SARS Coronavirus 2 by RT PCR  NEGATIVE NEGATIVE Final    Comment: (NOTE) SARS-CoV-2 target nucleic acids are NOT DETECTED.  The SARS-CoV-2 RNA is generally detectable in upper respiratory specimens during the acute phase of infection. The lowest concentration of SARS-CoV-2 viral copies this assay can detect is 138 copies/mL. A negative result does not preclude SARS-Cov-2 infection and should not be used as the sole basis for treatment or other patient management decisions. A negative result may occur with  improper specimen collection/handling, submission of specimen other than nasopharyngeal swab, presence of viral mutation(s) within the areas targeted by this assay, and inadequate number of viral copies(<138 copies/mL). A negative result must be combined with clinical observations, patient history, and epidemiological information. The expected result is Negative.  Fact Sheet for Patients:  EntrepreneurPulse.com.au  Fact Sheet for Healthcare Providers:  IncredibleEmployment.be  This test is no t yet approved or cleared by the [place] FDA and  has been authorized for detection and/or diagnosis of SARS-CoV-2 by FDA under an Emergency Use Authorization (EUA). This EUA will remain  in effect (meaning this test can be used) for the duration of the COVID-19 declaration under Section 564(b)(1) of the Act, 21 U.S.C.section 360bbb-3(b)(1), unless the authorization is terminated  or revoked sooner.       Influenza A by PCR NEGATIVE NEGATIVE Final   Influenza B by PCR NEGATIVE NEGATIVE Final    Comment: (NOTE) The Xpert Xpress SARS-CoV-2/FLU/RSV plus assay is intended as an aid in the diagnosis of influenza from Nasopharyngeal swab specimens and should not be used as a sole basis for treatment. Nasal washings and aspirates are unacceptable for Xpert Xpress SARS-CoV-2/FLU/RSV testing.  Fact Sheet for Patients: EntrepreneurPulse.com.au  Fact Sheet for  Healthcare Providers: IncredibleEmployment.be  This test is not yet approved or cleared by the [place] FDA and has been authorized for detection and/or diagnosis of SARS-CoV-2 by FDA under an Emergency Use Authorization (EUA). This EUA will remain in effect (meaning this test can be used) for the duration of the COVID-19 declaration under Section 564(b)(1) of the Act, 21 U.S.C. section 360bbb-3(b)(1), unless the authorization is terminated or revoked.  Performed at [facility], [place] [address] [place], [place] [address]   MRSA Next Gen by PCR, Nasal     Status: None   Collection Time: 11/30/21  3:34 AM   Specimen: Nasal Mucosa; Nasal Swab  Result Value Ref Range Status   MRSA by PCR Next Gen NOT DETECTED NOT DETECTED Final    Comment: (NOTE) The GeneXpert MRSA Assay (FDA approved for NASAL specimens only), is one component of a comprehensive MRSA colonization surveillance program. It is not intended to diagnose MRSA infection nor to guide or monitor treatment for MRSA infections. Test performance is not FDA approved in patients less  than 13 years old. Performed at [facility], [place] [address] [place], [place] [address]     Antimicrobials: Anti-infectives (From admission, onward)    Start     Dose/Rate Route Frequency Ordered Stop   12/01/21 2200  vancomycin (VANCOREADY) IVPB 1250 mg/250 mL  Status:  Discontinued        1,250 mg 166.7 mL/hr over 90 Minutes Intravenous Every 48 hours 11/29/21 2330 11/30/21 0948   11/30/21 2200  vancomycin (VANCOREADY) IVPB 750 mg/150 mL  Status:  Discontinued        750 mg 150 mL/hr over 60 Minutes Intravenous Every 24 hours 11/30/21 0948 11/30/21 1114   11/30/21 0800  cefTRIAXone (ROCEPHIN) 2 g in sodium chloride 0.9 % 100 mL IVPB  Status:  Discontinued        2 g 200 mL/hr over 30 Minutes Intravenous Every 24 hours 11/30/21 0334 12/01/21 1000   11/30/21 0345  azithromycin  (ZITHROMAX) 500 mg in sodium chloride 0.9 % 250 mL IVPB        500 mg 250 mL/hr over 60 Minutes Intravenous Every 24 hours 11/30/21 0334 12/05/21 0344   11/29/21 2330  ceFEPIme (MAXIPIME) 2 g in sodium chloride 0.9 % 100 mL IVPB        2 g 200 mL/hr over 30 Minutes Intravenous  Once 11/29/21 2321 11/30/21 0041   11/29/21 2330  vancomycin (VANCOCIN) IVPB 1000 mg/200 mL premix        1,000 mg 200 mL/hr over 60 Minutes Intravenous  Once 11/29/21 2323 11/30/21 [id number]      Culture/Microbiology    Component Value Date/Time   SDES  11/29/2021 2140    BLOOD RIGHT ANTECUBITAL Performed at [facility], [place] [address] [place], [place] [address]    SPECREQUEST  11/29/2021 2140    BOTTLES DRAWN AEROBIC AND ANAEROBIC Blood Culture adequate volume Performed at [facility] [place] [address] [place], [place] [address]    CULT  11/29/2021 2140    NO GROWTH 2 DAYS Performed at [facility] [address] [place], [place] [address]    REPTSTATUS PENDING 11/29/2021 2140    Other culture-see note  Radiology Studies: DG Chest 2 View  Result Date: 11/29/2021 CLINICAL DATA:  Suspected sepsis.  Shortness of breath EXAM: CHEST - 2 VIEW COMPARISON:  Radiograph 4 days ago 11/25/2021, CT 6 days ago 11/23/2021 FINDINGS: Left-sided pacemaker remains in place. Stable heart size and mediastinal contours. Aortic atherosclerosis. Increasing left lung base atelectasis with slight blunting of costophrenic angle. Biapical pleuroparenchymal scarring. No pulmonary edema. No pneumothorax. Postsurgical change right proximal humerus. Chronic deformity left proximal humerus. IMPRESSION: Increasing left lung base atelectasis. Possible small left pleural effusion. Electronically Signed   By: Keith Rake M.D.   On: 11/29/2021 21:35   CT Angio Chest PE W and/or Wo Contrast  Result Date: 11/29/2021 CLINICAL DATA:  Concern for pulmonary embolism. EXAM: CT ANGIOGRAPHY CHEST WITH  CONTRAST TECHNIQUE: Multidetector CT imaging of the chest was performed using the standard protocol during bolus administration of intravenous contrast. Multiplanar CT image reconstructions and MIPs were obtained to evaluate the vascular anatomy. RADIATION DOSE REDUCTION: This exam was performed according to the departmental dose-optimization program which includes automated exposure control, adjustment of the mA and/or kV according to patient size and/or use of iterative reconstruction technique. CONTRAST:  23mL OMNIPAQUE IOHEXOL 350 MG/ML SOLN COMPARISON:  Chest CT dated 11/23/2021. FINDINGS: Evaluation of this exam is limited due to respiratory motion  artifact. Cardiovascular: There is no cardiomegaly or pericardial effusion. There is coronary vascular calcification. Left pectoral pacemaker device. Mild atherosclerotic calcification of the thoracic aorta. No aneurysmal dilatation or dissection. Evaluation of the pulmonary arteries is limited due to respiratory motion artifact. Are no large or central pulmonary artery embolus identified. Mediastinum/Nodes: No hilar or mediastinal adenopathy. Small hiatal hernia. The esophagus is grossly unremarkable. No mediastinal fluid collection. Lungs/Pleura: Background of emphysema. Bibasilar dependent atelectasis. Pneumonia is not excluded. No pleural effusion or pneumothorax. The central airways are patent. Upper Abdomen: No acute abnormality. Musculoskeletal: Osteopenia with degenerative changes of the spine. Right shoulder fixation plate and screws. No acute osseous pathology. Old right posterior rib fractures. Review of the MIP images confirms the above findings. IMPRESSION: 1. No CT evidence of central pulmonary artery embolus. 2. Bibasilar dependent atelectasis/scarring. Pneumonia is not excluded. 3. Aortic Atherosclerosis (ICD10-I70.0) and Emphysema (ICD10-J43.9). Electronically Signed   By: Anner Crete M.D.   On: 11/29/2021 22:50     LOS: 2 days   Antonieta Pert,  MD [facility]  12/01/2021, 11:34 AM

## 2021-12-01 NOTE — Assessment & Plan Note (Signed)
Mild, monitor.

## 2021-12-01 NOTE — Progress Notes (Addendum)
°   12/01/21 1300  Clinical Encounter Type  Visited With Patient and family together  Visit Type Initial  Referral From Nurse  Consult/Referral To Chaplain  Spiritual Encounters  Spiritual Needs Sacred text;Prayer;Ritual;Emotional  Stress Factors  Patient Stress Factors Health changes;Loss of control;Major life changes  Family Stress Factors Exhausted;Family relationships;Loss of control;Major life changes   Chaplain visited patient at bedside with daughter Diedre at side this afternoon.  Baird Lyons RN requested visit from [place].  Winifred greeted Orthoptist and was engaged in talking and requested a visit from a [employer].  [place] provided emotional and spiritual support.  Chaplain informed patient that every effort would be made to have a [employer] visit her while shew  was admitted.  Chaplain ended visit with a departing blessing.

## 2021-12-01 NOTE — Assessment & Plan Note (Addendum)
Patient had diarrhea episodes 2/24 C diff/GI panel ordered given recent antibiotic use leukocytosis.  No recurrence of diarrhea-DC C. difficile and GI panel.

## 2021-12-02 DIAGNOSIS — R0602 Shortness of breath: Secondary | ICD-10-CM | POA: Diagnosis not present

## 2021-12-02 DIAGNOSIS — N179 Acute kidney failure, unspecified: Secondary | ICD-10-CM | POA: Diagnosis not present

## 2021-12-02 DIAGNOSIS — J441 Chronic obstructive pulmonary disease with (acute) exacerbation: Secondary | ICD-10-CM | POA: Diagnosis not present

## 2021-12-02 DIAGNOSIS — Z515 Encounter for palliative care: Secondary | ICD-10-CM | POA: Diagnosis not present

## 2021-12-02 LAB — URINALYSIS, COMPLETE (UACMP) WITH MICROSCOPIC
Bilirubin Urine: NEGATIVE
Glucose, UA: NEGATIVE mg/dL
Ketones, ur: NEGATIVE mg/dL
Nitrite: NEGATIVE
Protein, ur: NEGATIVE mg/dL
Specific Gravity, Urine: 1.012 (ref 1.005–1.030)
pH: 6 (ref 5.0–8.0)

## 2021-12-02 LAB — UREA NITROGEN, URINE: Urea Nitrogen, Ur: 875 mg/dL

## 2021-12-02 LAB — CBC
HCT: 40.1 % (ref 36.0–46.0)
Hemoglobin: 13.2 g/dL (ref 12.0–15.0)
MCH: 29.3 pg (ref 26.0–34.0)
MCHC: 32.9 g/dL (ref 30.0–36.0)
MCV: 89.1 fL (ref 80.0–100.0)
Platelets: 385 10*3/uL (ref 150–400)
RBC: 4.5 MIL/uL (ref 3.87–5.11)
RDW: 14.4 % (ref 11.5–15.5)
WBC: 21.7 10*3/uL — ABNORMAL HIGH (ref 4.0–10.5)
nRBC: 0 % (ref 0.0–0.2)

## 2021-12-02 MED ORDER — IPRATROPIUM-ALBUTEROL 0.5-2.5 (3) MG/3ML IN SOLN
3.0000 mL | Freq: Two times a day (BID) | RESPIRATORY_TRACT | Status: DC
  Administered 2021-12-02 – 2021-12-03 (×2): 3 mL via RESPIRATORY_TRACT
  Filled 2021-12-02 (×2): qty 3

## 2021-12-02 MED ORDER — PREDNISONE 20 MG PO TABS
40.0000 mg | ORAL_TABLET | Freq: Every day | ORAL | Status: DC
Start: 2021-12-02 — End: 2021-12-03
  Administered 2021-12-02 – 2021-12-03 (×2): 40 mg via ORAL
  Filled 2021-12-02 (×2): qty 2

## 2021-12-02 MED ORDER — PREDNISONE 10 MG PO TABS: 10.0000 mg | ORAL_TABLET | Freq: Every day | ORAL | Status: DC

## 2021-12-02 MED ORDER — HYDROXYZINE HCL 10 MG PO TABS
5.0000 mg | ORAL_TABLET | Freq: Three times a day (TID) | ORAL | Status: DC | PRN
  Administered 2021-12-02: 5 mg via ORAL
  Filled 2021-12-02: qty 1

## 2021-12-02 MED ORDER — METHYLPREDNISOLONE SODIUM SUCC 40 MG IJ SOLR
40.0000 mg | Freq: Once | INTRAMUSCULAR | Status: AC
Start: 2021-12-02 — End: 2021-12-02
  Administered 2021-12-02: 40 mg via INTRAVENOUS
  Filled 2021-12-02: qty 1

## 2021-12-02 MED ORDER — FLUTICASONE FUROATE-VILANTEROL 100-25 MCG/ACT IN AEPB
1.0000 | INHALATION_SPRAY | Freq: Every day | RESPIRATORY_TRACT | Status: DC
  Administered 2021-12-02 – 2021-12-03 (×2): 1 via RESPIRATORY_TRACT
  Filled 2021-12-02: qty 28

## 2021-12-02 MED ORDER — FUROSEMIDE 20 MG PO TABS
20.0000 mg | ORAL_TABLET | ORAL | Status: DC
  Administered 2021-12-02: 20 mg via ORAL
  Filled 2021-12-02: qty 1

## 2021-12-02 NOTE — Progress Notes (Signed)
PROGRESS NOTE Nikolina Tanori  [id number] DOB: 02/02/1931 DOA: 11/29/2021 PCP: Pcp, No   Brief Narrative/Hospital Course: 88 y.o. female with medical history significant for COPD, chronic hypoxic respiratory failure, chronic diastolic CHF, hypothyroidism, CKD 3A, depression, anxiety, and history of TIA, now presenting to the emergency department for evaluation of shortness of breath and hypotension.   she had worsening shortness of breath since the recent hospital discharge, becoming severe just prior to her presentation to the ED and was found to be hypotensive with systolic pressure in the [id number] at her ALF.she reports that her productive cough is chronic but worsening over the past couple days. In ED-afebrile, saturating 90s on 4 L/min of supplemental oxygen, tachypneic, and with initial blood pressure 73 systolic.  CTA chest is negative for PE.Chemistry panel notable for creatinine 1.29.  CBC with stable leukocytosis and mild thrombocytosis.  Lactic acid was normal.  CT angio chest no PE, bibasilar dependent atelectasis/scarring, pneumonia not excluded, aortic atherosclerosis and emphysema , chest x-ray with increasing left lung base atelectasis possible left pleural effusion.Blood cultures collected and the patient was treated with IV Solu-Medrol, cefepime, vancomycin, Pulmicort, and IV fluids and she is being admitted for acute Ciprodex medicine with chronic respiratory failure, hypotension AKI on CKD stage III, chronic diastolic CHF anxiety TIA and hypothyroidism. Patient has clinically improved with his steroids supplemental oxygen and bronchodilators. Patient had episodes of diarrhea GI panel/C. difficile was ordered 2/24 given recent antibiotic use and leukocytosis. Patient had no recurrence of diarrhea stool sample unable to be sent.  She had some dysuria but UA unremarkable.  WBC count up to 21 K and likely from patient's steroid, as she is afebrile.  Antibiotic regimen de-escalated and now on oral  doxycycline to cover for COPD, oral prednisone.  She had some rash that is improving on the cheeks-unclear  Etiology. Seen by palliative care, had a family meeting and plan is for skilled nursing versus with hospice once improved.     Subjective: Seen and examined this morning, she complains of being short of breath although no increasing oxygen requirement pulse ox stable she appeared comfortable not in dyspnea. No more diarrhea. WBC trending up but no fever.  Assessment and Plan: * COPD with acute exacerbation ([place])- (present on admission) AECOPD Chronic hypoxic respiratory failure on 3 to 4 L [place] atALF: having dyspnea/SOB although oxygen requirement has been stable, with dry cough and wheezing.wheezing improved we will do 1 more dose of IV steroids and transition to p.o. a steroid, of Rocephin/azithromycin and on doxycycline at this time procalcitonin reassuring afebrile given the bump in WBC count I feel its mostly reactive from steroid.  Patient complains of ongoing shortness of breath-question if anxiety playing a role in the setting of her chronic hypoxia and COPD emphysema-discussed with the daughter-would like to try with Atarax, she is on oxy low-dose. Recent Labs  Lab 11/27/21 0356 11/29/21 2140 11/30/21 0600 12/01/21 0640 12/02/21 0401  WBC 17.9* 17.4* 15.8* 20.4* 21.7*  LATICACIDVEN  --  1.5  --   --   --   PROCALCITON  --   --  <0.10  --   --     Chronic respiratory failure with hypoxia ([place])- (present on admission) On 3 to nasal cannula at rest and 4 L on ambulation.  Oxygen requirement has not increased  Acute renal failure superimposed on stage 3a chronic kidney disease ([place])- (present on admission) Secondary to hypotension/prerenal.resolved.resume Lasix  Recent Labs  Lab 11/27/21 0356 11/29/21 2140 11/30/21 0600  12/01/21 0640  BUN 34* 50* 36* 23  CREATININE 1.08* 1.29* 0.79 0.70    Hypotension- (present on admission) BP stable. Improved with IV  fluids  Chronic diastolic CHF (congestive heart failure) ([place])- (present on admission) Dehydrated on admission.  Will resume Lasix  Metabolic alkalosis Mild, monitor.  Diarrhea in adult patient Patient had diarrhea episodes 2/24 C diff/GI panel ordered given recent antibiotic use leukocytosis.  No recurrence of diarrhea-DC C. difficile and GI panel  Goals of care, counseling/discussion Appreciate palliative care input.  [facility] was planning to meet with the patient and patient's family at assisted living facility to enroll into hospice care.plan to discharge back to a skilled nursing facility with hospice.    Hyponatremia- (present on admission) Monitor  Hypothyroidism- (present on admission) Continue home Synthroid  TIA (transient ischemic attack)- (present on admission) TIA history for which she is on Plavix.  DVT prophylaxis: heparin injection 5,000 Units Start: 11/30/21 0600 Code Status:   Code Status: DNR Family Communication: plan of care discussed with patient at bedside.  Called and updated daughter on the phone 2/24  Disposition: Currently not  medically stable for discharge. Status is: Inpatient Remains inpatient appropriate because: For ongoing management of COPD exacerbation dyspnea/ diarrhea.  Objective: Vitals last 24 hrs: Vitals:   12/01/21 2229 12/02/21 0534 12/02/21 1154 12/02/21 1344  BP: (!) 129/58 (!) 158/73  (!) 163/77  Pulse: (!) 58 (!) 59  (!) 59  Resp: 20 16  18   Temp: 98.9 F (37.2 C) 98.4 F (36.9 C)  97.9 F (36.6 C)  TempSrc: Oral Oral  Oral  SpO2: 97% 93% 97% 94%  Weight:      Height:       Weight change:   Physical Examination: General exam: AA, not in dyspnea or distress older than stated age, weak appearing. HEENT:Oral mucosa moist, Ear/Nose WNL grossly, dentition normal. Respiratory system: bilaterally clear breath sounds has coughing with deep breath, nouse of accessory muscle Cardiovascular system: S1 & S2 +, No  JVD,. Gastrointestinal system: Abdomen soft,NT,ND, BS+ Nervous System:Alert, awake, moving extremities and grossly nonfocal Extremities: edema neg,distal peripheral pulses palpable.  Skin: No rashes,no icterus. MSK: Normal muscle bulk,tone, power   Medications reviewed: Scheduled Meds:  artificial tears  1 application Both Eyes TID   buPROPion  75 mg Oral Daily   clopidogrel  75 mg Oral Daily   dorzolamide-timolol  1 drop Both Eyes BID   doxycycline  100 mg Oral Q12H   fluticasone furoate-vilanterol  1 puff Inhalation Daily   furosemide  20 mg Oral QODAY   guaiFENesin  1,200 mg Oral BID   heparin  5,000 Units Subcutaneous Q8H   ipratropium-albuterol  3 mL Nebulization BID   latanoprost  1 drop Both Eyes QHS   levothyroxine  68.5 mcg Oral Q0600   mirtazapine  15 mg Oral QHS   Netarsudil Dimesylate  1 drop Both Eyes QHS   pantoprazole  40 mg Oral Daily   pentosan polysulfate  100 mg Oral TID   predniSONE  40 mg Oral Q breakfast   QUEtiapine  25 mg Oral QHS   sodium chloride flush  3 mL Intravenous Q12H  Continuous Infusions:  lactated ringers 50 mL/hr at 11/30/21 1847     Diet Order             Diet regular Room service appropriate? Yes; Fluid consistency: Thin  Diet effective now  Intake/Output Summary (Last 24 hours) at 12/02/2021 1404 Last data filed at 12/02/2021 0942 Gross per 24 hour  Intake 1560 ml  Output 1050 ml  Net 510 ml   Net IO Since Admission: 1,041.64 mL [12/02/21 1404]  Wt Readings from Last 3 Encounters:  11/29/21 65.8 kg  11/22/21 64.9 kg  03/28/21 64.9 kg     Unresulted Labs (From admission, onward)     Start     Ordered   11/30/21 1814  Gastrointestinal Panel by PCR , Stool  (Gastrointestinal Panel by PCR, Stool                                                                                                                                                     **Does Not include CLOSTRIDIUM DIFFICILE testing. **If CDIFF  testing is needed, place order from the "C Difficile Testing" order set.**)  Once,   R        11/30/21 1814   11/30/21 0333  Expectorated Sputum Assessment w Gram Stain, Rflx to Resp Cult  Once,   R        11/30/21 0334          Data Reviewed: I have personally reviewed following labs and imaging studies CBC: Recent Labs  Lab 11/27/21 0356 11/29/21 2140 11/30/21 0600 12/01/21 0640 12/02/21 0401  WBC 17.9* 17.4* 15.8* 20.4* 21.7*  NEUTROABS  --  13.5*  --   --   --   HGB 13.6 14.7 14.2 13.5 13.2  HCT 41.2 45.3 43.3 43.1 40.1  MCV 87.5 89.7 88.5 91.9 89.1  PLT 394 432* 375 255 0000000   Basic Metabolic Panel: Recent Labs  Lab 11/27/21 0356 11/29/21 2140 11/30/21 0600 12/01/21 0640  NA 135 136 136 131*  K 4.4 4.1 4.3 4.5  CL 101 103 108 104  CO2 26 26 21* 19*  GLUCOSE 95 114* 139* 122*  BUN 34* 50* 36* 23  CREATININE 1.08* 1.29* 0.79 0.70  CALCIUM 9.3 9.3 8.6* 9.1   GFR: Estimated Creatinine Clearance: 44.4 mL/min (by C-G formula based on SCr of 0.7 mg/dL). Liver Function Tests: Recent Labs  Lab 11/29/21 2140  AST 19  ALT 28  ALKPHOS 99  BILITOT 0.6  PROT 7.2  ALBUMIN 3.7   No results for input(s): LIPASE, AMYLASE in the last 168 hours. No results for input(s): AMMONIA in the last 168 hours. Coagulation Profile: Recent Labs  Lab 11/29/21 2140  INR 1.0   Cardiac Enzymes: No results for input(s): CKTOTAL, CKMB, CKMBINDEX, TROPONINI in the last 168 hours. BNP (last 3 results) No results for input(s): PROBNP in the last 8760 hours. HbA1C: No results for input(s): HGBA1C in the last 72 hours. CBG: Recent Labs  Lab 11/26/21 1138  GLUCAP 193*   Lipid Profile: No results for input(s): CHOL, HDL, LDLCALC, TRIG, CHOLHDL,  LDLDIRECT in the last 72 hours. Thyroid Function Tests: No results for input(s): TSH, T4TOTAL, FREET4, T3FREE, THYROIDAB in the last 72 hours. Anemia Panel: No results for input(s): VITAMINB12, FOLATE, FERRITIN, TIBC, IRON, RETICCTPCT in  the last 72 hours. Sepsis Labs: Recent Labs  Lab 11/29/21 2140 11/30/21 0600  PROCALCITON  --  <0.10  LATICACIDVEN 1.5  --     Recent Results (from the past 240 hour(s))  Resp Panel by RT-PCR (Flu A&B, Covid) Nasopharyngeal Swab     Status: None   Collection Time: 11/23/21  8:40 AM   Specimen: Nasopharyngeal Swab; Nasopharyngeal(NP) swabs in vial transport medium  Result Value Ref Range Status   SARS Coronavirus 2 by RT PCR NEGATIVE NEGATIVE Final    Comment: (NOTE) SARS-CoV-2 target nucleic acids are NOT DETECTED.  The SARS-CoV-2 RNA is generally detectable in upper respiratory specimens during the acute phase of infection. The lowest concentration of SARS-CoV-2 viral copies this assay can detect is 138 copies/mL. A negative result does not preclude SARS-Cov-2 infection and should not be used as the sole basis for treatment or other patient management decisions. A negative result may occur with  improper specimen collection/handling, submission of specimen other than nasopharyngeal swab, presence of viral mutation(s) within the areas targeted by this assay, and inadequate number of viral copies(<138 copies/mL). A negative result must be combined with clinical observations, patient history, and epidemiological information. The expected result is Negative.  Fact Sheet for Patients:  EntrepreneurPulse.com.au  Fact Sheet for Healthcare Providers:  IncredibleEmployment.be  This test is no t yet approved or cleared by the [place] FDA and  has been authorized for detection and/or diagnosis of SARS-CoV-2 by FDA under an Emergency Use Authorization (EUA). This EUA will remain  in effect (meaning this test can be used) for the duration of the COVID-19 declaration under Section 564(b)(1) of the Act, 21 U.S.C.section 360bbb-3(b)(1), unless the authorization is terminated  or revoked sooner.       Influenza A by PCR NEGATIVE NEGATIVE Final    Influenza B by PCR NEGATIVE NEGATIVE Final    Comment: (NOTE) The Xpert Xpress SARS-CoV-2/FLU/RSV plus assay is intended as an aid in the diagnosis of influenza from Nasopharyngeal swab specimens and should not be used as a sole basis for treatment. Nasal washings and aspirates are unacceptable for Xpert Xpress SARS-CoV-2/FLU/RSV testing.  Fact Sheet for Patients: EntrepreneurPulse.com.au  Fact Sheet for Healthcare Providers: IncredibleEmployment.be  This test is not yet approved or cleared by the [place] FDA and has been authorized for detection and/or diagnosis of SARS-CoV-2 by FDA under an Emergency Use Authorization (EUA). This EUA will remain in effect (meaning this test can be used) for the duration of the COVID-19 declaration under Section 564(b)(1) of the Act, 21 U.S.C. section 360bbb-3(b)(1), unless the authorization is terminated or revoked.  Performed at [facility], [address] [place], [place] [address]   Culture, blood (Routine x 2)     Status: None (Preliminary result)   Collection Time: 11/29/21  9:40 PM   Specimen: BLOOD  Result Value Ref Range Status   Specimen Description   Final    BLOOD RIGHT ANTECUBITAL Performed at [facility] [address], [place], [place] [address]    Special Requests   Final    BOTTLES DRAWN AEROBIC AND ANAEROBIC Blood Culture adequate volume Performed at [facility] [address], [place],  [address]    Culture   Final  NO GROWTH 3 DAYS Performed at [facility], [place] [address] [place], [place] [address]    Report Status PENDING  Incomplete  Resp Panel by RT-PCR (Flu A&B, Covid) Nasopharyngeal Swab     Status: None   Collection Time: 11/29/21 10:00 PM   Specimen: Nasopharyngeal Swab; Nasopharyngeal(NP) swabs in vial transport medium  Result Value Ref Range Status   SARS Coronavirus 2 by RT PCR NEGATIVE  NEGATIVE Final    Comment: (NOTE) SARS-CoV-2 target nucleic acids are NOT DETECTED.  The SARS-CoV-2 RNA is generally detectable in upper respiratory specimens during the acute phase of infection. The lowest concentration of SARS-CoV-2 viral copies this assay can detect is 138 copies/mL. A negative result does not preclude SARS-Cov-2 infection and should not be used as the sole basis for treatment or other patient management decisions. A negative result may occur with  improper specimen collection/handling, submission of specimen other than nasopharyngeal swab, presence of viral mutation(s) within the areas targeted by this assay, and inadequate number of viral copies(<138 copies/mL). A negative result must be combined with clinical observations, patient history, and epidemiological information. The expected result is Negative.  Fact Sheet for Patients:  EntrepreneurPulse.com.au  Fact Sheet for Healthcare Providers:  IncredibleEmployment.be  This test is no t yet approved or cleared by the [place] FDA and  has been authorized for detection and/or diagnosis of SARS-CoV-2 by FDA under an Emergency Use Authorization (EUA). This EUA will remain  in effect (meaning this test can be used) for the duration of the COVID-19 declaration under Section 564(b)(1) of the Act, 21 U.S.C.section 360bbb-3(b)(1), unless the authorization is terminated  or revoked sooner.       Influenza A by PCR NEGATIVE NEGATIVE Final   Influenza B by PCR NEGATIVE NEGATIVE Final    Comment: (NOTE) The Xpert Xpress SARS-CoV-2/FLU/RSV plus assay is intended as an aid in the diagnosis of influenza from Nasopharyngeal swab specimens and should not be used as a sole basis for treatment. Nasal washings and aspirates are unacceptable for Xpert Xpress SARS-CoV-2/FLU/RSV testing.  Fact Sheet for Patients: EntrepreneurPulse.com.au  Fact Sheet for Healthcare  Providers: IncredibleEmployment.be  This test is not yet approved or cleared by the [place] FDA and has been authorized for detection and/or diagnosis of SARS-CoV-2 by FDA under an Emergency Use Authorization (EUA). This EUA will remain in effect (meaning this test can be used) for the duration of the COVID-19 declaration under Section 564(b)(1) of the Act, 21 U.S.C. section 360bbb-3(b)(1), unless the authorization is terminated or revoked.  Performed at [facility], [place] [address], [place], [place] [address]   MRSA Next Gen by PCR, Nasal     Status: None   Collection Time: 11/30/21  3:34 AM   Specimen: Nasal Mucosa; Nasal Swab  Result Value Ref Range Status   MRSA by PCR Next Gen NOT DETECTED NOT DETECTED Final    Comment: (NOTE) The GeneXpert MRSA Assay (FDA approved for NASAL specimens only), is one component of a comprehensive MRSA colonization surveillance program. It is not intended to diagnose MRSA infection nor to guide or monitor treatment for MRSA infections. Test performance is not FDA approved in patients less than 73 years old. Performed at [facility], [place] [address] [place],  [address]     Antimicrobials: Anti-infectives (From admission, onward)    Start     Dose/Rate Route Frequency Ordered Stop   12/02/21 1000  doxycycline (VIBRA-TABS) tablet 100 mg  100 mg Oral Every 12 hours 12/01/21 1817     12/01/21 2200  vancomycin (VANCOREADY) IVPB 1250 mg/250 mL  Status:  Discontinued        1,250 mg 166.7 mL/hr over 90 Minutes Intravenous Every 48 hours 11/29/21 2330 11/30/21 0948   11/30/21 2200  vancomycin (VANCOREADY) IVPB 750 mg/150 mL  Status:  Discontinued        750 mg 150 mL/hr over 60 Minutes Intravenous Every 24 hours 11/30/21 0948 11/30/21 1114   11/30/21 0800  cefTRIAXone (ROCEPHIN) 2 g in sodium chloride 0.9 % 100 mL IVPB  Status:  Discontinued        2 g 200 mL/hr over 30  Minutes Intravenous Every 24 hours 11/30/21 0334 12/01/21 1000   11/30/21 0345  azithromycin (ZITHROMAX) 500 mg in sodium chloride 0.9 % 250 mL IVPB  Status:  Discontinued        500 mg 250 mL/hr over 60 Minutes Intravenous Every 24 hours 11/30/21 0334 12/01/21 1817   11/29/21 2330  ceFEPIme (MAXIPIME) 2 g in sodium chloride 0.9 % 100 mL IVPB        2 g 200 mL/hr over 30 Minutes Intravenous  Once 11/29/21 2321 11/30/21 0041   11/29/21 2330  vancomycin (VANCOCIN) IVPB 1000 mg/200 mL premix        1,000 mg 200 mL/hr over 60 Minutes Intravenous  Once 11/29/21 2323 11/30/21 [id number]      Culture/Microbiology    Component Value Date/Time   SDES  11/29/2021 2140    BLOOD RIGHT ANTECUBITAL Performed at [facility], [place] [address], [place], [place] [address]    SPECREQUEST  11/29/2021 2140    BOTTLES DRAWN AEROBIC AND ANAEROBIC Blood Culture adequate volume Performed at [facility] [address], [place], [place] [address]    CULT  11/29/2021 2140    NO GROWTH 3 DAYS Performed at [facility] [address], [place],  [address]    REPTSTATUS PENDING 11/29/2021 2140    Other culture-see note  Radiology Studies: No results found.   LOS: 3 days   Antonieta Pert, MD [facility]  12/02/2021, 2:04 PM

## 2021-12-02 NOTE — Progress Notes (Signed)
SATURATION QUALIFICATIONS: (This note is used to comply with regulatory documentation for home oxygen)  Patient Saturations on Room Air at Rest = 88%  Patient Saturations on Room Air while Ambulating = 82%  Patient Saturations on 4 Liters of oxygen while Ambulating = 95-100%  Please briefly explain why patient needs home oxygen:

## 2021-12-02 NOTE — Progress Notes (Signed)
Daily Progress Note   Patient Name: Sheila Mccoy       Date: 12/02/2021 DOB: 10/28/1930  Age: 88 y.o. MRN#: [id number] Attending Physician: Antonieta Pert, MD Primary Care Physician: Pcp, No Admit Date: 11/29/2021  Reason for Consultation/Follow-up: Establishing goals of care  Subjective: I saw and examined [name] today.  She was sitting in bed in no distress.  Reports that she is feeling "may be a little better" but still complains of shortness of breath.  She does think that the oral oxycodone is helpful.  Length of Stay: 3  Current Medications: Scheduled Meds:   artificial tears  1 application Both Eyes TID   buPROPion  75 mg Oral Daily   clopidogrel  75 mg Oral Daily   dorzolamide-timolol  1 drop Both Eyes BID   doxycycline  100 mg Oral Q12H   fluticasone furoate-vilanterol  1 puff Inhalation Daily   furosemide  20 mg Oral QODAY   guaiFENesin  1,200 mg Oral BID   heparin  5,000 Units Subcutaneous Q8H   ipratropium-albuterol  3 mL Nebulization BID   latanoprost  1 drop Both Eyes QHS   levothyroxine  68.5 mcg Oral Q0600   mirtazapine  15 mg Oral QHS   Netarsudil Dimesylate  1 drop Both Eyes QHS   pantoprazole  40 mg Oral Daily   pentosan polysulfate  100 mg Oral TID   predniSONE  40 mg Oral Q breakfast   QUEtiapine  25 mg Oral QHS   sodium chloride flush  3 mL Intravenous Q12H    Continuous Infusions:  lactated ringers 50 mL/hr at 11/30/21 1847    PRN Meds: acetaminophen **OR** acetaminophen, albuterol, diclofenac Sodium, famotidine, hydrOXYzine, oxyCODONE, polyethylene glycol  Physical Exam         General: Alert, awake, in no acute distress.  Frail Heart: Regular rate and rhythm. No murmur appreciated. Lungs: Decreased air movement Abdomen: Soft, nontender,  nondistended, positive bowel sounds.   Ext: No significant edema Skin: Warm and dry Neuro: Grossly intact, nonfocal.   Vital Signs: BP (!) 163/77 (BP Location: Left Arm)    Pulse (!) 59    Temp 97.9 F (36.6 C) (Oral)    Resp 18    Ht 5\' 3"  (1.6 m)    Wt 65.8 kg    SpO2 94%    BMI 25.69 kg/m  SpO2: SpO2: 94 % O2 Device: O2 Device: Nasal Cannula O2 Flow Rate: O2 Flow Rate (L/min): 4 L/min  Intake/output summary:  Intake/Output Summary (Last 24 hours) at 12/02/2021 1709 Last data filed at 12/02/2021 1424 Gross per 24 hour  Intake 1200 ml  Output 1700 ml  Net -500 ml    LBM: Last BM Date : 11/30/21 Baseline Weight: Weight: 65.8 kg Most recent weight: Weight: 65.8 kg       Palliative Assessment/Data:    Flowsheet Rows    Flowsheet Row Most Recent Value  Intake Tab   Referral Department Hospitalist  Unit at Time of Referral Med/Surg Unit  Palliative Care Primary Diagnosis Pulmonary  Date Notified 11/30/21  Palliative Care Type New Palliative care  Reason for referral Clarify Goals of Care  Date of Admission 11/29/21  Date first seen  by Palliative Care 11/30/21  # of days Palliative referral response time 0 Day(s)  # of days IP prior to Palliative referral 1  Clinical Assessment   Palliative Performance Scale Score 40%  Psychosocial & Spiritual Assessment   Palliative Care Outcomes   Patient/Family meeting held? Yes  Who was at the meeting? Patient, daughter       Patient Active Problem List   Diagnosis Date Noted   Diarrhea in adult patient 0000000   Metabolic alkalosis 0000000   Acute renal failure superimposed on stage 3a chronic kidney disease ([place]) 11/30/2021   Goals of care, counseling/discussion 11/30/2021   Acute and chronic respiratory failure with hypoxia ([place]) 11/29/2021   Hypotension 11/29/2021   Hyponatremia 11/24/2021   Acute on chronic respiratory failure with hypoxia ([place]) 11/23/2021   COPD with acute exacerbation ([place]) 11/23/2021    Glaucoma (increased eye pressure) 11/23/2021   DNR (do not resuscitate) 11/23/2021   Muscle weakness (generalized) 03/28/2021   Chronic respiratory failure with hypoxia ([place]) 01/12/2021   Chronic diastolic CHF (congestive heart failure) ([place]) 01/12/2021   GERD without esophagitis 01/12/2021   Traumatic closed fracture of one rib of right side with minimal displacement, initial encounter 01/11/2021   Memory difficulty 01/02/2021   Hypothyroidism 09/30/2020   TIA (transient ischemic attack) 09/29/2020   Anxiety and depression 07/12/2020   Acute lower UTI 07/09/2020   Adnexal cyst 07/09/2020    Palliative Care Assessment & Plan   Patient Profile: 88 y.o. female  with past medical history of COPD, chronic hypoxic respiratory failure, chronic diastolic heart failure, hypothyroidism, CKD, depression, anxiety, history of TIA admitted on 11/29/2021 with shortness of breath and hypotension.  She was recently admitted for COPD exacerbation and transition back to her assisted living facility.  Since then, she has continued to have issues with shortness of breath and chronic cough, however this acutely worsened today and she was brought to the ED and is currently being treated for COPD exacerbation.   Recommendations/Plan: DNR/DNI Continue current care.  Goal is to be medically maximized and then transition back to facility where she is planning on the likely her hospice benefits with [facility] the [place]. Shortness of breath: Continue low-dose opioids.  Discussed uptitrating to see if she felt better but she declined today.  I encouraged her to use more frequently as needed. Congestion: She requested increasing Mucinex.   Code Status:    Code Status Orders  (From admission, onward)           Start     Ordered   11/30/21 0332  Do not attempt resuscitation (DNR)  Continuous       Question Answer Comment  In the event of cardiac or respiratory ARREST Do not call a code blue   In the  event of cardiac or respiratory ARREST Do not perform Intubation, CPR, defibrillation or ACLS   In the event of cardiac or respiratory ARREST Use medication by any route, position, wound care, and other measures to relive pain and suffering. May use oxygen, suction and manual treatment of airway obstruction as needed for comfort.      11/30/21 0334           Code Status History     Date Active Date Inactive Code Status Order ID Comments User Context   11/23/2021 1720 11/27/2021 2021 DNR [id number]  Karmen Bongo, MD Inpatient   01/12/2021 0429 01/19/2021 0200 DNR [id number]  Vernelle Emerald, MD Inpatient   09/30/2020 [phone] 10/02/2020 2104  DNR [id number]  Marcelyn Bruins, MD Inpatient   07/09/2020 2223 07/13/2020 2206 DNR [id number]  Toy Baker, MD Inpatient      Advance Directive Documentation    Flowsheet Row Most Recent Value  Type of Advance Directive Out of facility DNR (pink MOST or yellow form)  Pre-existing out of facility DNR order (yellow form or pink MOST form) --  "MOST" Form in Place? --       Prognosis:  Unable to determine  Discharge Planning: [facility] with Hospice once medically maximized  Care plan was discussed with patient, daughter, RN, attending  Thank you for allowing the Palliative Medicine Team to assist in the care of this patient.  Micheline Rough, MD  Please contact Palliative Medicine Team phone at [phone] for questions and concerns.

## 2021-12-02 NOTE — Progress Notes (Signed)
Daily Progress Note   Patient Name: Sheila Mccoy       Date: 12/02/2021 DOB: Aug 23, 1931  Age: 88 y.o. MRN#: [id number] Attending Physician: Antonieta Pert, MD Primary Care Physician: Pcp, No Admit Date: 11/29/2021  Reason for Consultation/Follow-up: Establishing goals of care  Subjective: I saw and examined Sheila Mccoy this morning.  She was lying in bed in no distress.  No family present at time of my encounter.  She reports that she is feeling a little bit better today.  Still with some shortness of breath and feels that she is having continued chest congestion.  MAR reveals she had 1 dose of oxycodone last night but she does not remember if it was helpful to her.  She does ask for something to help her "cough up" congestion in her chest.  I also ran into her daughter, Sheila Mccoy, in the hallway.  Sheila Mccoy told me that her mother was complaining of some urinary pain last night.  Additionally, we discussed that she has a little bit of a malar rash on her cheeks that she believes started after one of the antibiotics was given.  I passed these concerns along to Dr. Lupita Mccoy.  Length of Stay: 3  Current Medications: Scheduled Meds:   artificial tears  1 application Both Eyes TID   buPROPion  75 mg Oral Daily   clopidogrel  75 mg Oral Daily   dorzolamide-timolol  1 drop Both Eyes BID   doxycycline  100 mg Oral Q12H   guaiFENesin  1,200 mg Oral BID   heparin  5,000 Units Subcutaneous Q8H   latanoprost  1 drop Both Eyes QHS   levothyroxine  68.5 mcg Oral Q0600   mirtazapine  15 mg Oral QHS   Netarsudil Dimesylate  1 drop Both Eyes QHS   pantoprazole  40 mg Oral Daily   pentosan polysulfate  100 mg Oral TID   predniSONE  40 mg Oral Q breakfast   QUEtiapine  25 mg Oral QHS   sodium chloride flush  3 mL  Intravenous Q12H    Continuous Infusions:  lactated ringers 50 mL/hr at 11/30/21 1847    PRN Meds: acetaminophen **OR** acetaminophen, albuterol, diclofenac Sodium, famotidine, oxyCODONE, polyethylene glycol  Physical Exam         General: Alert, awake, in no acute distress.  Frail  Heart: Regular rate and rhythm. No murmur appreciated. Lungs: Decreased air movement Abdomen: Soft, nontender, nondistended, positive bowel sounds.   Ext: No significant edema Skin: Warm and dry Neuro: Grossly intact, nonfocal.   Vital Signs: BP (!) 158/73 (BP Location: Left Arm)    Pulse (!) 59    Temp 98.4 F (36.9 C) (Oral)    Resp 16    Ht 5\' 3"  (1.6 m)    Wt 65.8 kg    SpO2 93%    BMI 25.69 kg/m  SpO2: SpO2: 93 % O2 Device: O2 Device: Nasal Cannula O2 Flow Rate: O2 Flow Rate (L/min): 4 L/min  Intake/output summary:  Intake/Output Summary (Last 24 hours) at 12/02/2021 [id number] Last data filed at 12/02/2021 0557 Gross per 24 hour  Intake 1800 ml  Output 1150 ml  Net 650 ml   LBM: Last BM Date : 11/30/21 Baseline Weight: Weight: 65.8 kg Most recent weight: Weight: 65.8 kg       Palliative Assessment/Data:    Flowsheet Rows    Flowsheet Row Most Recent Value  Intake Tab   Referral Department Hospitalist  Unit at Time of Referral Med/Surg Unit  Palliative Care Primary Diagnosis Pulmonary  Date Notified 11/30/21  Palliative Care Type New Palliative care  Reason for referral Clarify Goals of Care  Date of Admission 11/29/21  Date first seen by Palliative Care 11/30/21  # of days Palliative referral response time 0 Day(s)  # of days IP prior to Palliative referral 1  Clinical Assessment   Palliative Performance Scale Score 40%  Psychosocial & Spiritual Assessment   Palliative Care Outcomes   Patient/Family meeting held? Yes  Who was at the meeting? Patient, daughter       Patient Active Problem List   Diagnosis Date Noted   Diarrhea in adult patient 0000000   Metabolic  alkalosis 0000000   Acute renal failure superimposed on stage 3a chronic kidney disease ([place]) 11/30/2021   Goals of care, counseling/discussion 11/30/2021   Acute and chronic respiratory failure with hypoxia ([place]) 11/29/2021   Hypotension 11/29/2021   Hyponatremia 11/24/2021   Acute on chronic respiratory failure with hypoxia ([place]) 11/23/2021   COPD with acute exacerbation ([place]) 11/23/2021   Glaucoma (increased eye pressure) 11/23/2021   DNR (do not resuscitate) 11/23/2021   Muscle weakness (generalized) 03/28/2021   Chronic respiratory failure with hypoxia ([place]) 01/12/2021   Chronic diastolic CHF (congestive heart failure) ([place]) 01/12/2021   GERD without esophagitis 01/12/2021   Traumatic closed fracture of one rib of right side with minimal displacement, initial encounter 01/11/2021   Memory difficulty 01/02/2021   Hypothyroidism 09/30/2020   TIA (transient ischemic attack) 09/29/2020   Anxiety and depression 07/12/2020   Acute lower UTI 07/09/2020   Adnexal cyst 07/09/2020    Palliative Care Assessment & Plan   Patient Profile: 88 y.o. female  with past medical history of COPD, chronic hypoxic respiratory failure, chronic diastolic heart failure, hypothyroidism, CKD, depression, anxiety, history of TIA admitted on 11/29/2021 with shortness of breath and hypotension.  She was recently admitted for COPD exacerbation and transition back to her assisted living facility.  Since then, she has continued to have issues with shortness of breath and chronic cough, however this acutely worsened today and she was brought to the ED and is currently being treated for COPD exacerbation.   Recommendations/Plan: DNR/DNI Continue current care.  Goal is to be medically maximized and then transition back to facility where she is planning on the likely her hospice  benefits with [facility] [place]. Shortness of breath: She does not remember if low-dose opioids were beneficial.  I told her this is  something she may want to try again today. Congestion: She requested increasing Mucinex.   Code Status:    Code Status Orders  (From admission, onward)           Start     Ordered   11/30/21 0332  Do not attempt resuscitation (DNR)  Continuous       Question Answer Comment  In the event of cardiac or respiratory ARREST Do not call a code blue   In the event of cardiac or respiratory ARREST Do not perform Intubation, CPR, defibrillation or ACLS   In the event of cardiac or respiratory ARREST Use medication by any route, position, wound care, and other measures to relive pain and suffering. May use oxygen, suction and manual treatment of airway obstruction as needed for comfort.      11/30/21 0334           Code Status History     Date Active Date Inactive Code Status Order ID Comments User Context   11/23/2021 1720 11/27/2021 2021 DNR [id number]  Karmen Bongo, MD Inpatient   01/12/2021 0429 01/19/2021 0200 DNR [id number]  Vernelle Emerald, MD Inpatient   09/30/2020 0052 10/02/2020 2104 DNR [id number]  Marcelyn Bruins, MD Inpatient   07/09/2020 2223 07/13/2020 2206 DNR [id number]  Toy Baker, MD Inpatient      Advance Directive Documentation    Flowsheet Row Most Recent Value  Type of Advance Directive Out of facility DNR (pink MOST or yellow form)  Pre-existing out of facility DNR order (yellow form or pink MOST form) --  "MOST" Form in Place? --       Prognosis:  Unable to determine  Discharge Planning: [facility] with Hospice once medically maximized  Care plan was discussed with patient, daughter, RN, attending  Thank you for allowing the Palliative Medicine Team to assist in the care of this patient.   Time In: 1000 Time Out: 1050 Total Time 50 Prolonged Time Billed No   Roxas Clymer Domingo Cocking, MD  Please contact Palliative Medicine Team phone at [phone] for questions and concerns.

## 2021-12-03 MED ORDER — PREDNISONE 20 MG PO TABS: 40.0000 mg | ORAL_TABLET | Freq: Every day | ORAL | 0 refills | Status: AC

## 2021-12-03 MED ORDER — OXYCODONE HCL 5 MG/5ML PO SOLN: 2.0000 mg | ORAL | 0 refills | Status: DC | PRN

## 2021-12-03 MED ORDER — SALINE SPRAY 0.65 % NA SOLN
1.0000 | NASAL | Status: DC | PRN
  Administered 2021-12-03: 1 via NASAL
  Filled 2021-12-03: qty 44

## 2021-12-03 MED ORDER — DOXYCYCLINE HYCLATE 100 MG PO TABS
100.0000 mg | ORAL_TABLET | Freq: Two times a day (BID) | ORAL | 0 refills | Status: AC
Start: 2021-12-03 — End: 2021-12-07

## 2021-12-03 NOTE — Discharge Summary (Addendum)
Physician Discharge Summary   Patient: Sheila Mccoy MRN: [id number] DOB: [birth year]  Admit date:     11/29/2021  Discharge date: 12/03/21  Discharge Physician: Antonieta Pert   PCP: Pcp, No   Recommendations at discharge:    Hospice care to start on discharge at [facility] follow up in 1 wk  Hospital Course: 88 y.o. female with medical history significant for COPD, chronic hypoxic respiratory failure, chronic diastolic CHF, hypothyroidism, CKD 3A, depression, anxiety, and history of TIA, now presenting to the emergency department for evaluation of shortness of breath and hypotension.   she had worsening shortness of breath since the recent hospital discharge, becoming severe just prior to her presentation to the ED and was found to be hypotensive with systolic pressure in the [id number] at her ALF.she reports that her productive cough is chronic but worsening over the past couple days. In ED-afebrile, saturating 90s on 4 L/min of supplemental oxygen, tachypneic, and with initial blood pressure 73 systolic.  CTA chest is negative for PE.Chemistry panel notable for creatinine 1.29.  CBC with stable leukocytosis and mild thrombocytosis.  Lactic acid was normal.  CT angio chest no PE, bibasilar dependent atelectasis/scarring, pneumonia not excluded, aortic atherosclerosis and emphysema , chest x-ray with increasing left lung base atelectasis possible left pleural effusion.Blood cultures collected and the patient was treated with IV Solu-Medrol, cefepime, vancomycin, Pulmicort, and IV fluids and she is being admitted for acute Ciprodex medicine with chronic respiratory failure, hypotension AKI on CKD stage III, chronic diastolic CHF anxiety TIA and hypothyroidism. Patient has clinically improved with his steroids supplemental oxygen and bronchodilators. Patient had episodes of diarrhea GI panel/C. difficile was ordered 2/24 given recent antibiotic use and leukocytosis. Patient had no recurrence of diarrhea stool  sample unable to be sent.  She had some dysuria but UA unremarkable.  WBC count up to 21 K and likely from patient's steroid, as she is afebrile.  Antibiotic regimen de-escalated and now on oral doxycycline to cover for COPD, oral prednisone.  She had some rash that is improving on the cheeks-unclear  Etiology. Seen by palliative care, had a family meeting and plan is for back to facility with with hospice. Today am she feels better, not short of breath. She feels she is ready for return to ALF with hospice care.  Discharge Diagnoses: Principal Problem:   COPD with acute exacerbation ([place]) Active Problems:   Chronic respiratory failure with hypoxia (HCC)   Hypotension   Acute renal failure superimposed on stage 3a chronic kidney disease (HCC)   Chronic diastolic CHF (congestive heart failure) (HCC)   TIA (transient ischemic attack)   Hypothyroidism   Hyponatremia   Goals of care, counseling/discussion   Diarrhea in adult patient   Metabolic alkalosis * COPD with acute exacerbation ([place])- (present on admission) AECOPD Chronic hypoxic respiratory failure on 3 to 4 L [place] atALF: having dyspnea/SOB although oxygen requirement has been stable, with dry cough and wheezing.wheezing improved- cont po steroid and taper off at [place] . CONT PO doxycycline. He Procalcitonin reassuring,  afebrile given the bump in WBC count I feel its mostly reactive from steroid. Placed on low dose oxy to help with anxiety/pain by PMT Recent Labs  Lab 11/27/21 0356 11/29/21 2140 11/30/21 0600 12/01/21 0640 12/02/21 0401  WBC 17.9* 17.4* 15.8* 20.4* 21.7*  LATICACIDVEN  --  1.5  --   --   --   PROCALCITON  --   --  <0.10  --   --  Chronic respiratory failure with hypoxia ([place])- (present on admission) On 3 to nasal cannula at rest and 4 L on ambulation.  Oxygen requirement has not increased ands stable spo2.  Acute renal failure superimposed on stage 3a chronic kidney disease ([place])- (present on  admission) Secondary to hypotension/prerenal.resolved. cont on home Lasix  Recent Labs  Lab 11/27/21 0356 11/29/21 2140 11/30/21 0600 12/01/21 0640  BUN 34* 50* 36* 23  CREATININE 1.08* 1.29* 0.79 0.70    Hypotension- (present on admission) BP stable. Improved with IV fluids  Chronic diastolic CHF (congestive heart failure) ([place])- (present on admission) Dehydrated on admission. Resolved. cont Lasix  Metabolic alkalosis Mild, monitor.  Diarrhea in adult patient Patient had diarrhea episodes 2/24 C diff/GI panel ordered given recent antibiotic use leukocytosis.  No recurrence of diarrhea-DC C. difficile and GI panel.  Goals of care, counseling/discussion Appreciate palliative care input.  [facility] was planning to meet with the patient and patient's family at assisted living facility to enroll into hospice care.plan to discharge back to facility with hospice.    Hyponatremia- (present on admission) Monitor  Hypothyroidism- (present on admission) Continue home Synthroid  History of TIA (transient ischemic attack) TIA history for which she is on Plavix.   Consultants: PMT Procedures performed: NONE  Disposition:  alf w/ hospice care Diet recommendation:  Diet Orders (From admission, onward)     Start     Ordered   11/30/21 0222  Diet regular Room service appropriate? Yes; Fluid consistency: Thin  Diet effective now       Question Answer Comment  Room service appropriate? Yes   Fluid consistency: Thin      11/30/21 0221             DISCHARGE MEDICATION: Allergies as of 12/03/2021       Reactions   Atorvastatin Other (See Comments)   Muscle paralysis and unable to use the legs   Nitrofurantoin Other (See Comments)   PT reports possible allergy -- weakness, tiredness when taking   Rosuvastatin Other (See Comments)   Lower extremity weakness, gait instability and confusion   Hydromorphone Other (See Comments)   Very Disoriented & Confused    Morphine And Related Other (See Comments)   Catatonic   Morphine Other (See Comments)   Confusion   Tramadol Other (See Comments)   Causes confusion        Medication List     STOP taking these medications    cephALEXin 500 MG capsule Commonly known as: KEFLEX       TAKE these medications    acetaminophen 500 MG tablet Commonly known as: TYLENOL Take 1,000 mg by mouth every 6 (six) hours as needed for mild pain.   albuterol 108 (90 Base) MCG/ACT inhaler Commonly known as: VENTOLIN HFA Inhale 1-2 puffs into the lungs every 6 (six) hours as needed for wheezing or shortness of breath. What changed: how much to take   albuterol (2.5 MG/3ML) 0.083% nebulizer solution Commonly known as: PROVENTIL Take 3 mLs (2.5 mg total) by nebulization every 4 (four) hours as needed for shortness of breath or wheezing. What changed: reasons to take this   Biofreeze 4 % Gel Generic drug: Menthol (Topical Analgesic) Apply 1 application topically every 12 (twelve) hours as needed (for right shoulder pain).   Breo Ellipta 100-25 MCG/ACT Aepb Generic drug: fluticasone furoate-vilanterol Inhale 1 puff into the lungs daily.   buPROPion 75 MG tablet Commonly known as: WELLBUTRIN Take 75 mg by mouth daily.  clopidogrel 75 MG tablet Commonly known as: PLAVIX Take 1 tablet (75 mg total) by mouth daily.   Co Q 10 60 MG Caps Take 120 mg by mouth in the morning and at bedtime.   Cosopt 22.3-6.8 MG/ML ophthalmic solution Generic drug: dorzolamide-timolol 1 drop 2 (two) times daily.   cyanocobalamin 1000 MCG tablet Take 1,000 mcg by mouth daily.   cyclobenzaprine 10 MG tablet Commonly known as: FLEXERIL Take 1 tablet (10 mg total) by mouth 3 (three) times daily. Use with caution due to fall risk What changed:  when to take this reasons to take this additional instructions   diclofenac Sodium 1 % Gel Commonly known as: VOLTAREN Apply 2 g topically every 6 (six) hours as needed  (for back pain).   doxycycline 100 MG tablet Commonly known as: VIBRA-TABS Take 1 tablet (100 mg total) by mouth every 12 (twelve) hours for 4 days.   famotidine 10 MG tablet Commonly known as: PEPCID Take 10 mg by mouth every 12 (twelve) hours as needed for heartburn.   fluticasone 50 MCG/ACT nasal spray Commonly known as: FLONASE Place 1 spray into both nostrils daily. What changed:  how much to take when to take this reasons to take this   furosemide 20 MG tablet Commonly known as: LASIX Take 20 mg by mouth every other day.   guaiFENesin 600 MG 12 hr tablet Commonly known as: Mucinex Take 1 tablet (600 mg total) by mouth 2 (two) times daily.   latanoprost 0.005 % ophthalmic solution Commonly known as: XALATAN Place 1 drop into both eyes at bedtime.   loperamide 2 MG tablet Commonly known as: IMODIUM A-D Take 2 mg by mouth every 8 (eight) hours as needed (for diarrhea).   loratadine 10 MG tablet Commonly known as: CLARITIN Take 1 tablet (10 mg total) by mouth daily.   magnesium oxide 400 MG tablet Commonly known as: MAG-OX Take 400 mg by mouth daily.   mirtazapine 15 MG tablet Commonly known as: REMERON Take 1 tablet (15 mg total) by mouth at bedtime.   oxyCODONE 5 MG/5ML solution Commonly known as: ROXICODONE Take 2 mLs (2 mg total) by mouth every 4 (four) hours as needed for up to 6 doses (shortness of breath).   OXYGEN Inhale 3-4 L/min into the lungs continuous. Inhale 3 L/min of oxygen into the lungs continuously and increase to 4 L/min when ambulating or sats drop below 90%   pantoprazole 40 MG tablet Commonly known as: PROTONIX Take 40 mg by mouth daily.   polyethylene glycol 17 g packet Commonly known as: MIRALAX / GLYCOLAX Take 17 g by mouth 2 (two) times daily as needed for mild constipation. What changed: when to take this   predniSONE 20 MG tablet Commonly known as: DELTASONE Take 2 tablets (40 mg total) by mouth daily with breakfast for 20  doses. Take PO 4 tabs daily x 2 days,3 tabs daily x 2 days,2 tabs daily x 2 days,1 tab daily x 2 days then stop. Start taking on: December 04, 2021 What changed:  how much to take how to take this when to take this additional instructions   QUEtiapine 25 MG tablet Commonly known as: SEROquel Take 1 tablet (25 mg total) by mouth at bedtime.   Rhopressa 0.02 % Soln Generic drug: Netarsudil Dimesylate Place 1 drop into both eyes at bedtime.   Synthroid 137 MCG tablet Generic drug: levothyroxine Take 68.5 mcg by mouth daily before breakfast.   Systane 0.4-0.3 % Soln Generic  drug: Polyethyl Glycol-Propyl Glycol Place 1 drop into both eyes 4 (four) times daily.   Systane 0.4-0.3 % Gel ophthalmic gel Generic drug: Polyethyl Glycol-Propyl Glycol Place 1 application into both eyes 3 (three) times daily.   Vitamin D3 25 MCG (1000 UT) Caps Take 1 capsule (1,000 Units total) by mouth 2 (two) times daily.         Discharge Exam: Filed Weights   11/29/21 2037  Weight: 65.8 kg  Condition at discharge: fair  The results of significant diagnostics from this hospitalization (including imaging, microbiology, ancillary and laboratory) are listed below for reference.   Imaging Studies: DG Chest 2 View  Result Date: 11/29/2021 CLINICAL DATA:  Suspected sepsis.  Shortness of breath EXAM: CHEST - 2 VIEW COMPARISON:  Radiograph 4 days ago 11/25/2021, CT 6 days ago 11/23/2021 FINDINGS: Left-sided pacemaker remains in place. Stable heart size and mediastinal contours. Aortic atherosclerosis. Increasing left lung base atelectasis with slight blunting of costophrenic angle. Biapical pleuroparenchymal scarring. No pulmonary edema. No pneumothorax. Postsurgical change right proximal humerus. Chronic deformity left proximal humerus. IMPRESSION: Increasing left lung base atelectasis. Possible small left pleural effusion. Electronically Signed   By: Keith Rake M.D.   On: 11/29/2021 21:35   CT  Angio Chest PE W and/or Wo Contrast  Result Date: 11/29/2021 CLINICAL DATA:  Concern for pulmonary embolism. EXAM: CT ANGIOGRAPHY CHEST WITH CONTRAST TECHNIQUE: Multidetector CT imaging of the chest was performed using the standard protocol during bolus administration of intravenous contrast. Multiplanar CT image reconstructions and MIPs were obtained to evaluate the vascular anatomy. RADIATION DOSE REDUCTION: This exam was performed according to the departmental dose-optimization program which includes automated exposure control, adjustment of the mA and/or kV according to patient size and/or use of iterative reconstruction technique. CONTRAST:  67mL OMNIPAQUE IOHEXOL 350 MG/ML SOLN COMPARISON:  Chest CT dated 11/23/2021. FINDINGS: Evaluation of this exam is limited due to respiratory motion artifact. Cardiovascular: There is no cardiomegaly or pericardial effusion. There is coronary vascular calcification. Left pectoral pacemaker device. Mild atherosclerotic calcification of the thoracic aorta. No aneurysmal dilatation or dissection. Evaluation of the pulmonary arteries is limited due to respiratory motion artifact. Are no large or central pulmonary artery embolus identified. Mediastinum/Nodes: No hilar or mediastinal adenopathy. Small hiatal hernia. The esophagus is grossly unremarkable. No mediastinal fluid collection. Lungs/Pleura: Background of emphysema. Bibasilar dependent atelectasis. Pneumonia is not excluded. No pleural effusion or pneumothorax. The central airways are patent. Upper Abdomen: No acute abnormality. Musculoskeletal: Osteopenia with degenerative changes of the spine. Right shoulder fixation plate and screws. No acute osseous pathology. Old right posterior rib fractures. Review of the MIP images confirms the above findings. IMPRESSION: 1. No CT evidence of central pulmonary artery embolus. 2. Bibasilar dependent atelectasis/scarring. Pneumonia is not excluded. 3. Aortic Atherosclerosis  (ICD10-I70.0) and Emphysema (ICD10-J43.9). Electronically Signed   By: Anner Crete M.D.   On: 11/29/2021 22:50   CT Angio Chest PE W and/or Wo Contrast  Result Date: 11/23/2021 CLINICAL DATA:  Chest pain and shortness of breath. EXAM: CT ANGIOGRAPHY CHEST WITH CONTRAST TECHNIQUE: Multidetector CT imaging of the chest was performed using the standard protocol during bolus administration of intravenous contrast. Multiplanar CT image reconstructions and MIPs were obtained to evaluate the vascular anatomy. RADIATION DOSE REDUCTION: This exam was performed according to the departmental dose-optimization program which includes automated exposure control, adjustment of the mA and/or kV according to patient size and/or use of iterative reconstruction technique. CONTRAST:  15mL OMNIPAQUE IOHEXOL 350 MG/ML SOLN COMPARISON:  Chest radiograph dated 11/22/2021 and CT dated 03/22/2021. FINDINGS: Evaluation of this exam is limited due to respiratory motion artifact. Cardiovascular: There is no cardiomegaly or pericardial effusion. There is coronary vascular calcification. Left pectoral pacemaker device. Mild atherosclerotic calcification of the thoracic aorta. Top-normal ascending aorta measure up to 4 cm similar to prior CT. No dissection. Evaluation of the pulmonary arteries is limited due to respiratory motion artifact. No central pulmonary artery embolus identified. Mediastinum/Nodes: Mildly enlarged right hilar lymph node measures 12 mm, likely reactive. There is a small hiatal hernia. The esophagus is grossly unremarkable. No mediastinal fluid collection. Lungs/Pleura: Bibasilar atelectasis. There is background of emphysema and diffuse chronic interstitial coarsening. No consolidative changes, pleural effusion, or pneumothorax. The central airways are patent. Upper Abdomen: No acute abnormality. Musculoskeletal: Degenerative changes of the spine. Osteopenia. Old-appearing right rib fractures. Right shoulder  fixation. No acute osseous pathology. Review of the MIP images confirms the above findings. IMPRESSION: 1. No acute intrathoracic pathology. No large or central pulmonary artery embolus. 2. Aortic Atherosclerosis (ICD10-I70.0) and Emphysema (ICD10-J43.9). Electronically Signed   By: Anner Crete M.D.   On: 11/23/2021 02:04   DG CHEST PORT 1 VIEW  Result Date: 11/25/2021 CLINICAL DATA:  Shortness of breath and wheezing, initial encounter EXAM: PORTABLE CHEST 1 VIEW COMPARISON:  11/22/2021 FINDINGS: Cardiac shadow is stable. Pacing device is again noted. Lungs are well aerated bilaterally. Mild scarring is noted in the left base. No focal infiltrate or sizable effusion is noted. Aortic calcifications are seen. IMPRESSION: No acute abnormality noted. Electronically Signed   By: Inez Catalina M.D.   On: 11/25/2021 19:06   DG Chest Portable 1 View  Result Date: 11/22/2021 CLINICAL DATA:  Short of breath, nonproductive cough, hypoxia EXAM: PORTABLE CHEST 1 VIEW COMPARISON:  03/22/2021 FINDINGS: Single frontal view of the chest demonstrates dual lead pacer overlying left chest. The cardiac silhouette is unremarkable. No acute airspace disease, effusion, or pneumothorax. Stable basilar predominant scarring and fibrosis. Stable hilar prominence consistent with vascular shadow and adenopathy. No acute bony abnormalities. IMPRESSION: 1. Stable basilar predominant scarring and fibrosis. No acute airspace disease. Electronically Signed   By: Randa Ngo M.D.   On: 11/22/2021 23:18    Microbiology: Results for orders placed or performed during the hospital encounter of 11/29/21  Culture, blood (Routine x 2)     Status: None (Preliminary result)   Collection Time: 11/29/21  9:40 PM   Specimen: BLOOD  Result Value Ref Range Status   Specimen Description   Final    BLOOD RIGHT ANTECUBITAL Performed at [facility] [address], [place], [place] [address]    Special Requests   Final     BOTTLES DRAWN AEROBIC AND ANAEROBIC Blood Culture adequate volume Performed at [facility] [address] [place], [place] [address]    Culture   Final    NO GROWTH 4 DAYS Performed at [facility], [place] [address], [place], [place] [address]    Report Status PENDING  Incomplete  Resp Panel by RT-PCR (Flu A&B, Covid) Nasopharyngeal Swab     Status: None   Collection Time: 11/29/21 10:00 PM   Specimen: Nasopharyngeal Swab; Nasopharyngeal(NP) swabs in vial transport medium  Result Value Ref Range Status   SARS Coronavirus 2 by RT PCR NEGATIVE NEGATIVE Final    Comment: (NOTE) SARS-CoV-2 target nucleic acids are NOT DETECTED.  The SARS-CoV-2 RNA is generally detectable in upper respiratory specimens during the acute phase of infection.  The lowest concentration of SARS-CoV-2 viral copies this assay can detect is 138 copies/mL. A negative result does not preclude SARS-Cov-2 infection and should not be used as the sole basis for treatment or other patient management decisions. A negative result may occur with  improper specimen collection/handling, submission of specimen other than nasopharyngeal swab, presence of viral mutation(s) within the areas targeted by this assay, and inadequate number of viral copies(<138 copies/mL). A negative result must be combined with clinical observations, patient history, and epidemiological information. The expected result is Negative.  Fact Sheet for Patients:  BloggerCourse.com  Fact Sheet for Healthcare Providers:  SeriousBroker.it  This test is no t yet approved or cleared by the [place] FDA and  has been authorized for detection and/or diagnosis of SARS-CoV-2 by FDA under an Emergency Use Authorization (EUA). This EUA will remain  in effect (meaning this test can be used) for the duration of the COVID-19 declaration under Section 564(b)(1) of the Act,  21 U.S.C.section 360bbb-3(b)(1), unless the authorization is terminated  or revoked sooner.       Influenza A by PCR NEGATIVE NEGATIVE Final   Influenza B by PCR NEGATIVE NEGATIVE Final    Comment: (NOTE) The Xpert Xpress SARS-CoV-2/FLU/RSV plus assay is intended as an aid in the diagnosis of influenza from Nasopharyngeal swab specimens and should not be used as a sole basis for treatment. Nasal washings and aspirates are unacceptable for Xpert Xpress SARS-CoV-2/FLU/RSV testing.  Fact Sheet for Patients: BloggerCourse.com  Fact Sheet for Healthcare Providers: SeriousBroker.it  This test is not yet approved or cleared by the [place] FDA and has been authorized for detection and/or diagnosis of SARS-CoV-2 by FDA under an Emergency Use Authorization (EUA). This EUA will remain in effect (meaning this test can be used) for the duration of the COVID-19 declaration under Section 564(b)(1) of the Act, 21 U.S.C. section 360bbb-3(b)(1), unless the authorization is terminated or revoked.  Performed at [facility], [address] [place], [place] [address]   MRSA Next Gen by PCR, Nasal     Status: None   Collection Time: 11/30/21  3:34 AM   Specimen: Nasal Mucosa; Nasal Swab  Result Value Ref Range Status   MRSA by PCR Next Gen NOT DETECTED NOT DETECTED Final    Comment: (NOTE) The GeneXpert MRSA Assay (FDA approved for NASAL specimens only), is one component of a comprehensive MRSA colonization surveillance program. It is not intended to diagnose MRSA infection nor to guide or monitor treatment for MRSA infections. Test performance is not FDA approved in patients less than 7 years old. Performed at [facility], [address], [place], [place] [address]     Labs: CBC: Recent Labs  Lab 11/27/21 0356 11/29/21 2140 11/30/21 0600 12/01/21 0640 12/02/21 0401  WBC 17.9* 17.4*  15.8* 20.4* 21.7*  NEUTROABS  --  13.5*  --   --   --   HGB 13.6 14.7 14.2 13.5 13.2  HCT 41.2 45.3 43.3 43.1 40.1  MCV 87.5 89.7 88.5 91.9 89.1  PLT 394 432* 375 255 385   Basic Metabolic Panel: Recent Labs  Lab 11/27/21 0356 11/29/21 2140 11/30/21 0600 12/01/21 0640  NA 135 136 136 131*  K 4.4 4.1 4.3 4.5  CL 101 103 108 104  CO2 26 26 21* 19*  GLUCOSE 95 114* 139* 122*  BUN 34* 50* 36* 23  CREATININE 1.08* 1.29* 0.79 0.70  CALCIUM 9.3 9.3 8.6* 9.1   Liver Function Tests:  Recent Labs  Lab 11/29/21 2140  AST 19  ALT 28  ALKPHOS 99  BILITOT 0.6  PROT 7.2  ALBUMIN 3.7   CBG: No results for input(s): GLUCAP in the last 168 hours.   Discharge time spent: 35 minutes.  Signed: Antonieta Pert, MD [facility] 12/03/2021

## 2021-12-03 NOTE — Treatment Plan (Signed)
Patient discharged w/ daughter [name] whom is transporting her to [place]. Pt assisted on vehicle safely w/ x2 assist stand/pivot. Cell phone, kindle tablet, chargers, dentures & glasses w/ pt at d/c. AVS also placed in bag (daughter aware). Daughter provided oxygen tank for pt transport & all questions & concerns addressed.

## 2021-12-03 NOTE — Plan of Care (Signed)
°  Problem: Activity: Goal: Ability to tolerate increased activity will improve Outcome: Progressing Goal: Will verbalize the importance of balancing activity with adequate rest periods Outcome: Progressing   Problem: Respiratory: Goal: Ability to maintain a clear airway will improve Outcome: Progressing   Problem: Activity: Goal: Ability to tolerate increased activity will improve Outcome: Progressing

## 2021-12-03 NOTE — Progress Notes (Signed)
Palliative Care Brief Note  I ran into Sheila Mccoy as she was being wheeled to entrance for discharge.   She looked to be breathing easier today and we discussed continuing medications for shortness of breath as symptoms arise and that hospice will be there to support her moving forward.  She expressed appreciation for care provided.  Romie Minus, MD [facility] Palliative Medicine Team [phone]  NO CHARGE NOTE

## 2021-12-03 NOTE — TOC Transition Note (Signed)
Transition of Care [facility]) - CM/SW Discharge Note   Patient Details  Name: Sheila Mccoy MRN: [id number] Date of Birth: [birth year]  Transition of Care [facility]) CM/SW Contact:  Golda Acre, RN Phone Number: 12/03/2021, 2:23 PM   Clinical Narrative:    Patient followed for toc needs.  Dcd back to [place] with hospice care through [facility].     Final next level of care: Assisted Living Barriers to Discharge: No Barriers Identified   Patient Goals and CMS Choice Patient states their goals for this hospitalization and ongoing recovery are:: to go home CMS Medicare.gov Compare Post Acute Care list provided to:: Patient Choice offered to / list presented to : Patient  Discharge Placement                       Discharge Plan and Services   Discharge Planning Services: CM Consult                                 Social Determinants of Health (SDOH) Interventions     Readmission Risk Interventions Readmission Risk Prevention Plan 12/03/2021  Transportation Screening Complete  PCP or Specialist Appt within 3-5 Days Complete  HRI or Home Care Consult Complete  Social Work Consult for Recovery Care Planning/Counseling Complete  Palliative Care Screening Complete  Medication Review [facility]) Complete

## 2021-12-03 NOTE — TOC Progression Note (Addendum)
Transition of Care [facility] - Progression Note    Patient Details  Name: Sheila Mccoy MRN: [id number] Date of Birth: Oct 04, 1931  Transition of Care [facility]) CM/SW Contact  Leeroy Cha, RN Phone Number: 12/03/2021, 8:50 AM  Clinical Narrative:    Patient requesting to speak to [facility]-text with information sent to [facility].  Return notification from [facility] is actively following. Tct-daughter [name]/notified patient has been dcd  back to [facility] on [address].Jenne Pane made aware and will follow at the alf for hospice care. Tct [facility]/notified of return dc summary and orders faxed to [phone].      Expected Discharge Plan and Services                                                 Social Determinants of Health (SDOH) Interventions    Readmission Risk Interventions No flowsheet data found.

## 2021-12-04 LAB — CULTURE, BLOOD (ROUTINE X 2)
Culture: NO GROWTH
Special Requests: ADEQUATE

## 2022-01-27 ENCOUNTER — Encounter (HOSPITAL_COMMUNITY): Payer: Self-pay | Admitting: Emergency Medicine

## 2022-01-27 ENCOUNTER — Emergency Department (HOSPITAL_COMMUNITY)

## 2022-01-27 ENCOUNTER — Other Ambulatory Visit: Payer: Self-pay

## 2022-01-27 ENCOUNTER — Inpatient Hospital Stay (HOSPITAL_COMMUNITY)
Admission: EM | Admit: 2022-01-27 | Discharge: 2022-01-31 | DRG: 535 | Disposition: A | Discharge status: Skilled Nursing Facility | Source: Skilled Nursing Facility | Attending: Internal Medicine | Admitting: Internal Medicine

## 2022-01-27 ENCOUNTER — Inpatient Hospital Stay (HOSPITAL_COMMUNITY): Admitting: Anesthesiology

## 2022-01-27 DIAGNOSIS — Z885 Allergy status to narcotic agent status: Secondary | ICD-10-CM

## 2022-01-27 DIAGNOSIS — Z79890 Hormone replacement therapy (postmenopausal): Secondary | ICD-10-CM | POA: Diagnosis not present

## 2022-01-27 DIAGNOSIS — Z79899 Other long term (current) drug therapy: Secondary | ICD-10-CM

## 2022-01-27 DIAGNOSIS — Z515 Encounter for palliative care: Secondary | ICD-10-CM

## 2022-01-27 DIAGNOSIS — W19XXXA Unspecified fall, initial encounter: Principal | ICD-10-CM

## 2022-01-27 DIAGNOSIS — S72115A Nondisplaced fracture of greater trochanter of left femur, initial encounter for closed fracture: Principal | ICD-10-CM | POA: Diagnosis present

## 2022-01-27 DIAGNOSIS — F319 Bipolar disorder, unspecified: Secondary | ICD-10-CM | POA: Diagnosis present

## 2022-01-27 DIAGNOSIS — Z7982 Long term (current) use of aspirin: Secondary | ICD-10-CM | POA: Diagnosis not present

## 2022-01-27 DIAGNOSIS — N1831 Chronic kidney disease, stage 3a: Secondary | ICD-10-CM | POA: Diagnosis present

## 2022-01-27 DIAGNOSIS — Z8673 Personal history of transient ischemic attack (TIA), and cerebral infarction without residual deficits: Secondary | ICD-10-CM | POA: Diagnosis not present

## 2022-01-27 DIAGNOSIS — Y92009 Unspecified place in unspecified non-institutional (private) residence as the place of occurrence of the external cause: Secondary | ICD-10-CM

## 2022-01-27 DIAGNOSIS — W010XXA Fall on same level from slipping, tripping and stumbling without subsequent striking against object, initial encounter: Secondary | ICD-10-CM | POA: Diagnosis present

## 2022-01-27 DIAGNOSIS — I5032 Chronic diastolic (congestive) heart failure: Secondary | ICD-10-CM | POA: Diagnosis present

## 2022-01-27 DIAGNOSIS — M25552 Pain in left hip: Secondary | ICD-10-CM

## 2022-01-27 DIAGNOSIS — K5900 Constipation, unspecified: Secondary | ICD-10-CM | POA: Diagnosis present

## 2022-01-27 DIAGNOSIS — S72009A Fracture of unspecified part of neck of unspecified femur, initial encounter for closed fracture: Secondary | ICD-10-CM | POA: Diagnosis not present

## 2022-01-27 DIAGNOSIS — F419 Anxiety disorder, unspecified: Secondary | ICD-10-CM | POA: Diagnosis present

## 2022-01-27 DIAGNOSIS — J9621 Acute and chronic respiratory failure with hypoxia: Secondary | ICD-10-CM | POA: Diagnosis present

## 2022-01-27 DIAGNOSIS — S72002A Fracture of unspecified part of neck of left femur, initial encounter for closed fracture: Secondary | ICD-10-CM

## 2022-01-27 DIAGNOSIS — Z7951 Long term (current) use of inhaled steroids: Secondary | ICD-10-CM | POA: Diagnosis not present

## 2022-01-27 DIAGNOSIS — E039 Hypothyroidism, unspecified: Secondary | ICD-10-CM | POA: Diagnosis present

## 2022-01-27 DIAGNOSIS — Z66 Do not resuscitate: Secondary | ICD-10-CM | POA: Diagnosis present

## 2022-01-27 DIAGNOSIS — Z888 Allergy status to other drugs, medicaments and biological substances status: Secondary | ICD-10-CM

## 2022-01-27 DIAGNOSIS — J441 Chronic obstructive pulmonary disease with (acute) exacerbation: Secondary | ICD-10-CM | POA: Diagnosis present

## 2022-01-27 DIAGNOSIS — F32A Depression, unspecified: Secondary | ICD-10-CM | POA: Diagnosis present

## 2022-01-27 LAB — URINALYSIS, ROUTINE W REFLEX MICROSCOPIC
Bilirubin Urine: NEGATIVE
Glucose, UA: NEGATIVE mg/dL
Hgb urine dipstick: NEGATIVE
Ketones, ur: NEGATIVE mg/dL
Leukocytes,Ua: NEGATIVE
Nitrite: NEGATIVE
Protein, ur: NEGATIVE mg/dL
Specific Gravity, Urine: 1.008 (ref 1.005–1.030)
pH: 7 (ref 5.0–8.0)

## 2022-01-27 LAB — BASIC METABOLIC PANEL
Anion gap: 12 (ref 5–15)
BUN: 21 mg/dL (ref 8–23)
CO2: 26 mmol/L (ref 22–32)
Calcium: 9.6 mg/dL (ref 8.9–10.3)
Chloride: 97 mmol/L — ABNORMAL LOW (ref 98–111)
Creatinine, Ser: 0.89 mg/dL (ref 0.44–1.00)
GFR, Estimated: >60 mL/min (ref >60)
Glucose, Bld: 120 mg/dL — ABNORMAL HIGH (ref 70–99)
Potassium: 4.2 mmol/L (ref 3.5–5.1)
Sodium: 135 mmol/L (ref 135–145)

## 2022-01-27 LAB — CBC
HCT: 42.8 % (ref 36.0–46.0)
Hemoglobin: 13.6 g/dL (ref 12.0–15.0)
MCH: 28.8 pg (ref 26.0–34.0)
MCHC: 31.8 g/dL (ref 30.0–36.0)
MCV: 90.7 fL (ref 80.0–100.0)
Platelets: 389 10*3/uL (ref 150–400)
RBC: 4.72 MIL/uL (ref 3.87–5.11)
RDW: 14.7 % (ref 11.5–15.5)
WBC: 15.4 10*3/uL — ABNORMAL HIGH (ref 4.0–10.5)
nRBC: 0 % (ref 0.0–0.2)

## 2022-01-27 MED ORDER — ACETAMINOPHEN 500 MG PO TABS
1000.0000 mg | ORAL_TABLET | Freq: Four times a day (QID) | ORAL | Status: DC | PRN
  Administered 2022-01-27 – 2022-01-30 (×4): 1000 mg via ORAL
  Filled 2022-01-27 (×5): qty 2

## 2022-01-27 MED ORDER — BUPIVACAINE-EPINEPHRINE (PF) 0.5% -1:200000 IJ SOLN
INTRAMUSCULAR | Status: DC | PRN
  Administered 2022-01-27: 25 mL via PERINEURAL

## 2022-01-27 MED ORDER — FAMOTIDINE 20 MG PO TABS: 10.0000 mg | ORAL_TABLET | Freq: Two times a day (BID) | ORAL | Status: DC | PRN

## 2022-01-27 MED ORDER — FENTANYL CITRATE (PF) 100 MCG/2ML IJ SOLN
INTRAMUSCULAR | Status: AC
  Filled 2022-01-27: qty 2

## 2022-01-27 MED ORDER — ENOXAPARIN SODIUM 30 MG/0.3ML IJ SOSY
30.0000 mg | PREFILLED_SYRINGE | INTRAMUSCULAR | Status: DC
  Administered 2022-01-27 – 2022-01-30 (×4): 30 mg via SUBCUTANEOUS
  Filled 2022-01-27 (×4): qty 0.3

## 2022-01-27 MED ORDER — DORZOLAMIDE HCL-TIMOLOL MAL 2-0.5 % OP SOLN
1.0000 [drp] | Freq: Two times a day (BID) | OPHTHALMIC | Status: DC
  Administered 2022-01-28 – 2022-01-31 (×7): 1 [drp] via OPHTHALMIC
  Filled 2022-01-27: qty 10

## 2022-01-27 MED ORDER — QUETIAPINE FUMARATE 25 MG PO TABS
25.0000 mg | ORAL_TABLET | Freq: Every day | ORAL | Status: DC
  Administered 2022-01-27 – 2022-01-30 (×4): 25 mg via ORAL
  Filled 2022-01-27 (×5): qty 1

## 2022-01-27 MED ORDER — MIRTAZAPINE 15 MG PO TABS
15.0000 mg | ORAL_TABLET | Freq: Every day | ORAL | Status: DC
  Administered 2022-01-27 – 2022-01-30 (×4): 15 mg via ORAL
  Filled 2022-01-27 (×5): qty 1

## 2022-01-27 MED ORDER — LEVOTHYROXINE SODIUM 25 MCG PO TABS
68.5000 ug | ORAL_TABLET | Freq: Every day | ORAL | Status: DC
  Administered 2022-01-28 – 2022-01-31 (×4): 68.5 ug via ORAL
  Filled 2022-01-27 (×4): qty 1

## 2022-01-27 MED ORDER — FLUTICASONE FUROATE-VILANTEROL 100-25 MCG/ACT IN AEPB
1.0000 | INHALATION_SPRAY | Freq: Every day | RESPIRATORY_TRACT | Status: DC
  Administered 2022-01-28 – 2022-01-29 (×2): 1 via RESPIRATORY_TRACT
  Filled 2022-01-27: qty 28

## 2022-01-27 MED ORDER — FUROSEMIDE 20 MG PO TABS
20.0000 mg | ORAL_TABLET | ORAL | Status: DC
  Administered 2022-01-28 – 2022-01-30 (×2): 20 mg via ORAL
  Filled 2022-01-27 (×3): qty 1

## 2022-01-27 MED ORDER — OXYCODONE HCL 5 MG PO TABS
5.0000 mg | ORAL_TABLET | Freq: Four times a day (QID) | ORAL | Status: DC | PRN
  Administered 2022-01-27 – 2022-01-29 (×3): 5 mg via ORAL
  Filled 2022-01-27 (×3): qty 1

## 2022-01-27 MED ORDER — VITAMIN B-12 1000 MCG PO TABS
1000.0000 ug | ORAL_TABLET | Freq: Every day | ORAL | Status: DC
  Administered 2022-01-28 – 2022-01-31 (×4): 1000 ug via ORAL
  Filled 2022-01-27 (×4): qty 1

## 2022-01-27 MED ORDER — PANTOPRAZOLE SODIUM 40 MG PO TBEC
40.0000 mg | DELAYED_RELEASE_TABLET | Freq: Every day | ORAL | Status: DC
  Administered 2022-01-28 – 2022-01-31 (×4): 40 mg via ORAL
  Filled 2022-01-27 (×4): qty 1

## 2022-01-27 MED ORDER — BISACODYL 5 MG PO TBEC
5.0000 mg | DELAYED_RELEASE_TABLET | Freq: Every day | ORAL | Status: DC | PRN
  Administered 2022-01-29: 5 mg via ORAL
  Filled 2022-01-27: qty 1

## 2022-01-27 MED ORDER — AEROCHAMBER PLUS FLO-VU LARGE MISC
1.0000 | Freq: Once | Status: DC
Start: 2022-01-27 — End: 2022-01-31

## 2022-01-27 MED ORDER — OXYCODONE HCL 5 MG PO TABS
5.0000 mg | ORAL_TABLET | ORAL | Status: DC | PRN
  Administered 2022-01-29 – 2022-01-30 (×2): 10 mg via ORAL
  Filled 2022-01-27 (×2): qty 2

## 2022-01-27 MED ORDER — CLOPIDOGREL BISULFATE 75 MG PO TABS
75.0000 mg | ORAL_TABLET | Freq: Every day | ORAL | Status: DC
  Administered 2022-01-28 – 2022-01-31 (×4): 75 mg via ORAL
  Filled 2022-01-27 (×4): qty 1

## 2022-01-27 MED ORDER — VITAMIN D 25 MCG (1000 UNIT) PO TABS
1000.0000 [IU] | ORAL_TABLET | Freq: Two times a day (BID) | ORAL | Status: DC
  Administered 2022-01-28 – 2022-01-31 (×7): 1000 [IU] via ORAL
  Filled 2022-01-27 (×7): qty 1

## 2022-01-27 MED ORDER — LORATADINE 10 MG PO TABS
10.0000 mg | ORAL_TABLET | Freq: Every day | ORAL | Status: DC
  Administered 2022-01-28 – 2022-01-31 (×4): 10 mg via ORAL
  Filled 2022-01-27 (×4): qty 1

## 2022-01-27 MED ORDER — BUPROPION HCL 75 MG PO TABS
75.0000 mg | ORAL_TABLET | Freq: Every day | ORAL | Status: DC
Start: 2022-01-28 — End: 2022-01-31
  Administered 2022-01-28 – 2022-01-31 (×4): 75 mg via ORAL
  Filled 2022-01-27 (×4): qty 1

## 2022-01-27 NOTE — ED Provider Notes (Signed)
?[facility] ?Provider Note ? ? ?CSN: [id number] ?Arrival date & time: 01/27/22  1215 ? ?  ? ?History ? ?Chief Complaint  ?Patient presents with  ? Fall  ? Hip Pain  ? ? ?Sheila Journeyleanor Mccoy is a 88 y.o. female presenting form [facility] with a fall yesterday morning, onto left hip, now complaining of pain in her left hip and lower back significantly worse with walking and bearing weight.  She normally walks with a rollator walker, but has not been able to bear weight since her fall.  Her daughter at bedside provides supplemental history, as well as paramedics.  The patient reports her pain is minimal or gone when she is completely at rest, but significantly worse with standing.  She is also having dysuria and burning with urination which is a recurring symptom of UTIs, and her daughter requested UTI testing. ? ?Her daughter confirms that the patient is on hospice care for worsening hypoxia and pulmonary disease, now on 4 L nasal cannula, with issues with air hunger.  The patient is a DNR/DNI with a signed form at the bedside. ? ?HPI ? ?  ? ?Home Medications ?Prior to Admission medications   ?Medication Sig Start Date End Date Taking? Authorizing Provider  ?acetaminophen (TYLENOL) 500 MG tablet Take 1,000 mg by mouth every 6 (six) hours as needed for mild pain.    [provider]  ?albuterol (PROVENTIL) (2.5 MG/3ML) 0.083% nebulizer solution Take 3 mLs (2.5 mg total) by nebulization every 4 (four) hours as needed for shortness of breath or wheezing. ?Patient taking differently: Take 2.5 mg by nebulization every 4 (four) hours as needed for shortness of breath. 11/27/21   Osvaldo ShipperKrishnan, Gokul, MD  ?albuterol (VENTOLIN HFA) 108 (90 Base) MCG/ACT inhaler Inhale 1-2 puffs into the lungs every 6 (six) hours as needed for wheezing or shortness of breath. ?Patient taking differently: Inhale 2 puffs into the lungs every 6 (six) hours as needed for wheezing or shortness of breath. 11/27/21    Osvaldo ShipperKrishnan, Gokul, MD  ?Virgel BouquetBREO ELLIPTA 100-25 MCG/ACT AEPB Inhale 1 puff into the lungs daily. 11/20/21   [provider]  ?buPROPion (WELLBUTRIN) 75 MG tablet Take 75 mg by mouth daily.  06/13/20   [provider]  ?Cholecalciferol (VITAMIN D3) 25 MCG (1000 UT) CAPS Take 1 capsule (1,000 Units total) by mouth 2 (two) times daily. 11/27/21   Osvaldo ShipperKrishnan, Gokul, MD  ?clopidogrel (PLAVIX) 75 MG tablet Take 1 tablet (75 mg total) by mouth daily. 12/28/20   Ngetich, Donalee Citrininah C, NP  ?Coenzyme Q10 (CO Q 10) 60 MG CAPS Take 120 mg by mouth in the morning and at bedtime.    [provider]  ?COSOPT 22.3-6.8 MG/ML ophthalmic solution 1 drop 2 (two) times daily.    [provider]  ?cyanocobalamin 1000 MCG tablet Take 1,000 mcg by mouth daily.    [provider]  ?cyclobenzaprine (FLEXERIL) 10 MG tablet Take 1 tablet (10 mg total) by mouth 3 (three) times daily. Use with caution due to fall risk ?Patient taking differently: Take 10 mg by mouth daily as needed for muscle spasms (Use with caution due to fall risk). 02/09/21   Ngetich, Dinah C, NP  ?diclofenac Sodium (VOLTAREN) 1 % GEL Apply 2 g topically every 6 (six) hours as needed (for back pain).    [provider]  ?famotidine (PEPCID) 10 MG tablet Take 10 mg by mouth every 12 (twelve) hours as needed for heartburn.    [provider]  ?fluticasone (FLONASE) 50 MCG/ACT nasal spray Place 1 spray into both nostrils daily. ?Patient taking differently: Place 2 sprays into both nostrils daily as needed (for congestion). 01/18/21   Almon Hercules, MD  ?furosemide (LASIX) 20 MG tablet Take 20 mg by mouth every other day.    [provider]  ?guaiFENesin (MUCINEX) 600 MG 12 hr tablet Take 1 tablet (600 mg total) by mouth 2 (two) times daily. 02/09/21   Ngetich, Dinah C, NP  ?latanoprost (XALATAN) 0.005 % ophthalmic solution Place 1 drop into both eyes at bedtime.    [provider]  ?loperamide (IMODIUM A-D) 2 MG tablet  Take 2 mg by mouth every 8 (eight) hours as needed (for diarrhea). 07/23/21   [provider]  ?loratadine (CLARITIN) 10 MG tablet Take 1 tablet (10 mg total) by mouth daily. 01/19/21   Almon Hercules, MD  ?magnesium oxide (MAG-OX) 400 MG tablet Take 400 mg by mouth daily.    [provider]  ?Menthol, Topical Analgesic, (BIOFREEZE) 4 % GEL Apply 1 application topically every 12 (twelve) hours as needed (for right shoulder pain).    [provider]  ?mirtazapine (REMERON) 15 MG tablet Take 1 tablet (15 mg total) by mouth at bedtime. 02/09/21   Ngetich, Dinah C, NP  ?oxyCODONE (ROXICODONE) 5 MG/5ML solution Take 2 mLs (2 mg total) by mouth every 4 (four) hours as needed for up to 6 doses (shortness of breath). 12/03/21   Lanae Boast, MD  ?OXYGEN Inhale 3-4 L/min into the lungs continuous. Inhale 3 L/min of oxygen into the lungs continuously and increase to 4 L/min when ambulating or sats drop below 90%    [provider]  ?pantoprazole (PROTONIX) 40 MG tablet Take 40 mg by mouth daily. 11/01/20   [provider]  ?polyethylene glycol (MIRALAX / GLYCOLAX) 17 g packet Take 17 g by mouth 2 (two) times daily as needed for mild constipation. ?Patient taking differently: Take 17 g by mouth daily as needed for mild constipation. 01/18/21   Almon Hercules, MD  ?QUEtiapine (SEROQUEL) 25 MG tablet Take 1 tablet (25 mg total) by mouth at bedtime. 03/28/21   York Spaniel, MD  ?RHOPRESSA 0.02 % SOLN Place 1 drop into both eyes at bedtime. 11/18/21   [provider]  ?SYNTHROID 137 MCG tablet Take 68.5 mcg by mouth daily before breakfast.    [provider]  ?SYSTANE 0.4-0.3 % GEL ophthalmic gel Place 1 application into both eyes 3 (three) times daily.    [provider]  ?SYSTANE 0.4-0.3 % SOLN Place 1 drop into both eyes 4 (four) times daily.    [provider]  ?   ? ?Allergies    ?Atorvastatin, Nitrofurantoin, Rosuvastatin, Hydromorphone, Morphine and  related, Morphine, and Tramadol   ? ?Review of Systems   ?Review of Systems ? ?Physical Exam ?Updated Vital Signs ?BP 100/67   Pulse 70   Temp 99.3 ?F (37.4 ?C) (Oral)   Resp (!) 22   SpO2 92%  ?Physical Exam ?Constitutional:   ?   General: She is not in acute distress. ?HENT:  ?   Head: Normocephalic and atraumatic.  ?Eyes:  ?   Conjunctiva/sclera: Conjunctivae normal.  ?   Pupils: Pupils are equal, round, and reactive to light.  ?Cardiovascular:  ?   Rate and Rhythm: Normal rate and regular rhythm.  ?Pulmonary:  ?   Effort: Pulmonary effort is normal. No respiratory distress.  ?   Comments:  On 4L [place] ?Abdominal:  ?   General: There is no distension.  ?   Tenderness: There is no abdominal tenderness.  ?Skin: ?   General: Skin is warm and dry.  ?Neurological:  ?   General: No focal deficit present.  ?   Mental Status: She is alert and oriented to person, place, and time. Mental status is at baseline.  ?Psychiatric:     ?   Mood and Affect: Mood normal.     ?   Behavior: Behavior normal.  ? ? ?ED Results / Procedures / Treatments   ?Labs ?(all labs ordered are listed, but only abnormal results are displayed) ?Labs Reviewed  ?URINALYSIS, ROUTINE W REFLEX MICROSCOPIC  ?BASIC METABOLIC PANEL  ?CBC  ? ? ?EKG ?None ? ?Radiology ?CT Lumbar Spine Wo Contrast ? ?Result Date: 01/27/2022 ?CLINICAL DATA:  Back trauma, no prior imaging (Age >= 16y). Hip trauma, fracture suspected, xray done EXAM: CT LUMBAR SPINE WITHOUT CONTRAST TECHNIQUE: Multidetector CT imaging of the lumbar spine was performed without intravenous contrast administration. Multiplanar CT image reconstructions were also generated. RADIATION DOSE REDUCTION: This exam was performed according to the departmental dose-optimization program which includes automated exposure control, adjustment of the mA and/or kV according to patient size and/or use of iterative reconstruction technique. COMPARISON:  CT lumbar spine 07/09/2020 FINDINGS: Segmentation: 5 lumbar type  vertebrae. Alignment: Normal. Vertebrae: Interval worsening of a chronic L1 compression fracture with now up to 60% height loss anteriorly. Associated old nonunionized right L1 transverse process fracture an

## 2022-01-27 NOTE — Anesthesia Preprocedure Evaluation (Addendum)
Anesthesia Evaluation  ?Patient identified by MRN, date of birth, ID band ?Patient awake ? ? ? ?Reviewed: ?Allergy & Precautions, Patient's Chart, lab work & pertinent test results ? ?History of Anesthesia Complications ?Negative for: history of anesthetic complications ? ?Airway ?Mallampati: III ? ?TM Distance: >3 FB ?Neck ROM: Full ? ? ? Dental ? ?(+) Dental Advisory Given ?  ?Pulmonary ?COPD,  COPD inhaler and oxygen dependent,  ?  ? ?+ decreased breath sounds ? ? ? ? ? Cardiovascular ?+CHF  ? ?Rhythm:Regular  ?1. Left ventricular ejection fraction, by estimation, is 60 to 65%. The  ?left ventricle has normal function. The left ventricle has no regional  ?wall motion abnormalities. There is moderate concentric left ventricular  ?hypertrophy. Left ventricular  ?diastolic parameters are consistent with Grade II diastolic dysfunction  ?(pseudonormalization). Elevated left atrial pressure.  ??2. Right ventricular systolic function is normal. The right ventricular  ?size is normal.  ??3. The mitral valve is normal in structure. Mild to moderate mitral valve  ?regurgitation. No evidence of mitral stenosis. Moderate mitral annular  ?calcification.  ??4. The aortic valve is normal in structure. There is mild calcification  ?of the aortic valve. There is moderate thickening of the aortic valve.  ?Aortic valve regurgitation is mild. Mild to moderate aortic valve  ?sclerosis/calcification is present, without  ?any evidence of aortic stenosis.  ??5. The inferior vena cava is normal in size with greater than 50%  ?respiratory variability, suggesting right atrial pressure of 3 mmHg.  ?  ?Neuro/Psych ?PSYCHIATRIC DISORDERS Anxiety TIA  ? GI/Hepatic ?GERD  ,  ?Endo/Other  ?Hypothyroidism  ? Renal/GU ?Lab Results ?     Component                Value               Date                 ?     CREATININE               0.89                01/27/2022           ?  ? ?  ?Musculoskeletal ? ? Abdominal ?   ?Peds ? Hematology ?negative hematology ROS ?(+) Lab Results ?     Component                Value               Date                 ?     WBC                      15.4 (H)            01/27/2022           ?     HGB                      13.6                01/27/2022           ?     HCT                      42.8  01/27/2022           ?     MCV                      90.7                01/27/2022           ?     PLT                      389                 01/27/2022           ?   ?Anesthesia Other Findings ?Chronic pain meds ? Reproductive/Obstetrics ? ?  ? ? ? ? ? ? ? ? ? ? ? ? ? ?  ?  ? ? ? ? ? ? ? ?Anesthesia Physical ?Anesthesia Plan ? ?ASA: 4 ? ?Anesthesia Plan: Regional  ? ?Post-op Pain Management: Regional block*  ? ?Induction:  ? ?PONV Risk Score and Plan: 2 and Treatment may vary due to age or medical condition ? ?Airway Management Planned: Natural Airway and Nasal Cannula ? ?Additional Equipment: None ? ?Intra-op Plan:  ? ?Post-operative Plan:  ? ?Informed Consent: I have reviewed the patients History and Physical, chart, labs and discussed the procedure including the risks, benefits and alternatives for the proposed anesthesia with the patient or authorized representative who has indicated his/her understanding and acceptance.  ? ? ? ? ? ?Plan Discussed with:  ? ?Anesthesia Plan Comments:   ? ? ? ? ? ?Anesthesia Quick Evaluation ? ?

## 2022-01-27 NOTE — ED Notes (Signed)
Patient transported to X-ray 

## 2022-01-27 NOTE — Anesthesia Procedure Notes (Addendum)
Anesthesia Regional Block: Peng block  ? ?Pre-Anesthetic Checklist: , timeout performed,  Correct Patient, Correct Site, Correct Laterality,  Correct Procedure, Correct Position, site marked,  Risks and benefits discussed,  Surgical consent,  Pre-op evaluation,  At surgeon's request and post-op pain management ? ?Laterality: Left and Lower ? ?Prep: chloraprep     ?  ?Needles:  ?Injection technique: Single-shot ? ?  ? ? ?Needle Length: 9cm  ?Needle Gauge: 22  ? ? ? ?Additional Needles: ?Arrow? StimuQuik? ECHO Echogenic Stimulating PNB Needle ? ?Procedures:,,,, ultrasound used (permanent image in chart),,    ?Narrative:  ?Start time: 01/27/2022 6:38 PM ?End time: 01/27/2022 6:45 PM ?Injection made incrementally with aspirations every 5 mL. ? ?Performed by: Personally  ?Anesthesiologist: Val Eagle, MD ? ? ? ? ?

## 2022-01-27 NOTE — ED Provider Notes (Signed)
Care transferred to me.  Orthopedics has seen patient and recommends weightbearing as tolerated.  Given her poor pain control and living in assisted living, will admit. D/w Dr. Chipper Herb ?  ?Pricilla Loveless, MD ?01/27/22 1657 ? ?

## 2022-01-27 NOTE — H&P (Signed)
?History and Physical  ? ? ?Sheila Mccoy [id number] DOB: 05/16/1931 DOA: 01/27/2022 ? ?PCP: Pcp, No (Confirm with patient/family/NH records and if not entered, this has to be entered at [facility] point of entry) ?Patient coming from: Home/assisted living ? ?I have personally briefly reviewed patient's old medical records in [place] ? ?Chief Complaint: left hip hurts ? ?HPI: Sheila Mccoy is a 88 y.o. female with medical history significant of advanced COPD with chronic hypoxic respite failure on home oxygen, severe anxiety on narcotics, hypothyroidism, CKD stage IIIa, chronic diastolic CHF, chronic ambulation dysfunction baseline with roller walker plus wheelchair, presented with mechanical fall and left hip fracture. ? ?Patient unsteady at baseline, has been using combination of roller walker+ wheelchair alternately for ambulation, and today patient lost balance and fell on the left hip.  Denies any head injury, no loss of consciousness. ? ?ED Course: ED showed minimally displaced left trochanter fracture.  Evaluated by orthopedic surgery recommended conservative management with pain control and PT ? ?Review of Systems: As per HPI otherwise 14 point review of systems negative.  ? ? ?Past Medical History:  ?Diagnosis Date  ? Acute metabolic encephalopathy [id number]  ? Adnexal cyst 07/09/2020  ? Anxiety   ? Artificial pacemaker   ? Bipolar disorder ([place])   ? COPD (chronic obstructive pulmonary disease) ([place])   ? Depression   ? History of CT scan 2021  ? Legs.  ? Hypothyroidism 09/30/2020  ? Memory difficulty 01/02/2021  ? Sacral fracture ([place]) 07/09/2020  ? TIA (transient ischemic attack) 09/29/2020  ? UTI (urinary tract infection) 07/09/2020  ? ? ?Past Surgical History:  ?Procedure Laterality Date  ? arm fracture surgery    ? MRI  2021  ? Lungs.  ? right shoulder     ? pins in right shoulder.  ? ? ? reports that she has never smoked. She has never used smokeless tobacco. She reports that she does not drink  alcohol and does not use drugs. ? ?Allergies  ?Allergen Reactions  ? Atorvastatin Other (See Comments)  ?  Muscle paralysis and unable to use the legs  ? Nitrofurantoin Other (See Comments)  ?  PT reports possible allergy -- weakness, tiredness when taking ?  ? Rosuvastatin Other (See Comments)  ?  Lower extremity weakness, gait instability and confusion  ? Hydromorphone Other (See Comments)  ?  Very Disoriented & Confused  ? Morphine And Related Other (See Comments)  ?  Catatonic  ? Morphine Other (See Comments)  ?  Confusion ? ?  ? Tramadol Other (See Comments)  ?  Causes confusion ?  ? ? ?Family History  ?Problem Relation Age of Onset  ? Lung cancer Mother   ? Cancer Sister   ? Cancer Brother   ? Cancer Sister   ? Sarcoidosis Brother   ? Leukemia Daughter   ? ? ?Prior to Admission medications   ?Medication Sig Start Date End Date Taking? Authorizing Provider  ?acetaminophen (TYLENOL) 500 MG tablet Take 1,000 mg by mouth every 6 (six) hours as needed for mild pain.    [provider]  ?albuterol (PROVENTIL) (2.5 MG/3ML) 0.083% nebulizer solution Take 3 mLs (2.5 mg total) by nebulization every 4 (four) hours as needed for shortness of breath or wheezing. ?Patient taking differently: Take 2.5 mg by nebulization every 4 (four) hours as needed for shortness of breath. 11/27/21   Bonnielee Haff, MD  ?albuterol (VENTOLIN HFA) 108 (90 Base) MCG/ACT inhaler Inhale 1-2 puffs into the lungs every  6 (six) hours as needed for wheezing or shortness of breath. ?Patient taking differently: Inhale 2 puffs into the lungs every 6 (six) hours as needed for wheezing or shortness of breath. 11/27/21   Bonnielee Haff, MD  ?Memory [place] ELLIPTA 100-25 MCG/ACT AEPB Inhale 1 puff into the lungs daily. 11/20/21   [provider]  ?buPROPion (WELLBUTRIN) 75 MG tablet Take 75 mg by mouth daily.  06/13/20   [provider]  ?Cholecalciferol (VITAMIN D3) 25 MCG (1000 UT) CAPS Take 1 capsule (1,000 Units total) by mouth 2 (two)  times daily. 11/27/21   Bonnielee Haff, MD  ?clopidogrel (PLAVIX) 75 MG tablet Take 1 tablet (75 mg total) by mouth daily. 12/28/20   Ngetich, Nelda Bucks, NP  ?Coenzyme Q10 (CO Q 10) 60 MG CAPS Take 120 mg by mouth in the morning and at bedtime.    [provider]  ?COSOPT 22.3-6.8 MG/ML ophthalmic solution 1 drop 2 (two) times daily.    [provider]  ?cyanocobalamin 1000 MCG tablet Take 1,000 mcg by mouth daily.    [provider]  ?cyclobenzaprine (FLEXERIL) 10 MG tablet Take 1 tablet (10 mg total) by mouth 3 (three) times daily. Use with caution due to fall risk ?Patient taking differently: Take 10 mg by mouth daily as needed for muscle spasms (Use with caution due to fall risk). 02/09/21   Ngetich, Dinah C, NP  ?diclofenac Sodium (VOLTAREN) 1 % GEL Apply 2 g topically every 6 (six) hours as needed (for back pain).    [provider]  ?famotidine (PEPCID) 10 MG tablet Take 10 mg by mouth every 12 (twelve) hours as needed for heartburn.    [provider]  ?fluticasone (FLONASE) 50 MCG/ACT nasal spray Place 1 spray into both nostrils daily. ?Patient taking differently: Place 2 sprays into both nostrils daily as needed (for congestion). 01/18/21   Mercy Riding, MD  ?furosemide (LASIX) 20 MG tablet Take 20 mg by mouth every other day.    [provider]  ?guaiFENesin (MUCINEX) 600 MG 12 hr tablet Take 1 tablet (600 mg total) by mouth 2 (two) times daily. 02/09/21   Ngetich, Dinah C, NP  ?latanoprost (XALATAN) 0.005 % ophthalmic solution Place 1 drop into both eyes at bedtime.    [provider]  ?loperamide (IMODIUM A-D) 2 MG tablet Take 2 mg by mouth every 8 (eight) hours as needed (for diarrhea). 07/23/21   [provider]  ?loratadine (CLARITIN) 10 MG tablet Take 1 tablet (10 mg total) by mouth daily. 01/19/21   Mercy Riding, MD  ?magnesium oxide (MAG-OX) 400 MG tablet Take 400 mg by mouth daily.    [provider]  ?Menthol, Topical  Analgesic, (BIOFREEZE) 4 % GEL Apply 1 application topically every 12 (twelve) hours as needed (for right shoulder pain).    [provider]  ?mirtazapine (REMERON) 15 MG tablet Take 1 tablet (15 mg total) by mouth at bedtime. 02/09/21   Ngetich, Dinah C, NP  ?oxyCODONE (ROXICODONE) 5 MG/5ML solution Take 2 mLs (2 mg total) by mouth every 4 (four) hours as needed for up to 6 doses (shortness of breath). 12/03/21   Antonieta Pert, MD  ?OXYGEN Inhale 3-4 L/min into the lungs continuous. Inhale 3 L/min of oxygen into the lungs continuously and increase to 4 L/min when ambulating or sats drop below 90%    [provider]  ?pantoprazole (PROTONIX) 40 MG tablet Take 40 mg by mouth daily. 11/01/20   [provider]  ?polyethylene glycol (MIRALAX / GLYCOLAX) 17 g packet Take 17 g by mouth 2 (two) times daily as needed for mild constipation. ?Patient taking differently: Take 17 g by mouth daily as needed for mild constipation. 01/18/21   Mercy Riding, MD  ?QUEtiapine (SEROQUEL) 25 MG tablet Take 1 tablet (25 mg total) by mouth at bedtime. 03/28/21   Kathrynn Ducking, MD  ?RHOPRESSA 0.02 % SOLN Place 1 drop into both eyes at bedtime. 11/18/21   [provider]  ?SYNTHROID 137 MCG tablet Take 68.5 mcg by mouth daily before breakfast.    [provider]  ?SYSTANE 0.4-0.3 % GEL ophthalmic gel Place 1 application into both eyes 3 (three) times daily.    [provider]  ?SYSTANE 0.4-0.3 % SOLN Place 1 drop into both eyes 4 (four) times daily.    [provider]  ? ? ?Physical Exam: ?Vitals:  ? 01/27/22 1300 01/27/22 1400 01/27/22 1517 01/27/22 1615  ?BP: 113/63 111/60 100/67 102/84  ?Pulse: 71 69 70 80  ?Resp: (!) 21 (!) 21 (!) 22 19  ?Temp:      ?TempSrc:      ?SpO2: 93% 93% 92% 95%  ? ? ?Constitutional: NAD, calm, comfortable ?Vitals:  ? 01/27/22 1300 01/27/22 1400 01/27/22 1517 01/27/22 1615  ?BP: 113/63 111/60 100/67 102/84  ?Pulse: 71 69 70 80  ?Resp: (!) 21 (!) 21 (!) 22  19  ?Temp:      ?TempSrc:      ?SpO2: 93% 93% 92% 95%  ? ?Eyes: PERRL, lids and conjunctivae normal ?ENMT: Mucous membranes are moist. Posterior pharynx clear of any exudate or lesions.Normal dentition.  ?N

## 2022-01-27 NOTE — Consult Note (Signed)
? ?ORTHOPAEDIC CONSULTATION ? ?REQUESTING PHYSICIAN: Emeline General, MD ? ?Chief Complaint: Left hip pain ? ?HPI: ?Sheila Mccoy is a 88 y.o. female who presents with with left hip pain after a fall while at home.  She at baseline uses a rolling walker as well as a wheelchair.  She is here today with her daughter who helps with her medical decision making.  Her daughter states that she has recently been hospitalized for COPD exacerbation which has resulted in significant air hunger.  She is currently on hospice and is DNR DNI.  She has baseline oxycodone for this as well.  Denies any issues with the left hip specifically.  She was able to walk on it after the fall although there was significant pain. ? ?Past Medical History:  ?Diagnosis Date  ? Acute metabolic encephalopathy 07/09/2020  ? Adnexal cyst 07/09/2020  ? Anxiety   ? Artificial pacemaker   ? Bipolar disorder (HCC)   ? COPD (chronic obstructive pulmonary disease) (HCC)   ? Depression   ? History of CT scan 2021  ? Legs.  ? Hypothyroidism 09/30/2020  ? Memory difficulty 01/02/2021  ? Sacral fracture (HCC) 07/09/2020  ? TIA (transient ischemic attack) 09/29/2020  ? UTI (urinary tract infection) 07/09/2020  ? ?Past Surgical History:  ?Procedure Laterality Date  ? arm fracture surgery    ? MRI  2021  ? Lungs.  ? right shoulder     ? pins in right shoulder.  ? ?Social History  ? ?Socioeconomic History  ? Marital status: Divorced  ?  Spouse name: Not on file  ? Number of children: 8  ? Years of education: Not on file  ? Highest education level: Associate degree: academic program  ?Occupational History  ? Not on file  ?Tobacco Use  ? Smoking status: Never  ? Smokeless tobacco: Never  ?Vaping Use  ? Vaping Use: Never used  ?Substance and Sexual Activity  ? Alcohol use: Never  ? Drug use: Never  ? Sexual activity: Not on file  ?Other Topics Concern  ? Not on file  ?Social History Narrative  ? 01/02/21 lives in [facility] @ [address],  ? Right  Handed  ? Drinks no caffeine  ? ?Social Determinants of Health  ? ?Financial Resource Strain: Not on file  ?Food Insecurity: Not on file  ?Transportation Needs: Not on file  ?Physical Activity: Not on file  ?Stress: Not on file  ?Social Connections: Not on file  ? ?Family History  ?Problem Relation Age of Onset  ? Lung cancer Mother   ? Cancer Sister   ? Cancer Brother   ? Cancer Sister   ? Sarcoidosis Brother   ? Leukemia Daughter   ? ?- negative except otherwise stated in the family history section ?Allergies  ?Allergen Reactions  ? Atorvastatin Other (See Comments)  ?  Muscle paralysis and unable to use the legs  ? Nitrofurantoin Other (See Comments)  ?  PT reports possible allergy -- weakness, tiredness when taking ?  ? Rosuvastatin Other (See Comments)  ?  Lower extremity weakness, gait instability and confusion  ? Hydromorphone Other (See Comments)  ?  Very Disoriented & Confused  ? Morphine And Related Other (See Comments)  ?  Catatonic  ? Morphine Other (See Comments)  ?  Confusion ? ?  ? Tramadol Other (See Comments)  ?  Causes confusion ?  ? ?Prior to Admission medications   ?Medication Sig Start Date End Date Taking? Authorizing  Provider  ?acetaminophen (TYLENOL) 500 MG tablet Take 1,000 mg by mouth every 6 (six) hours as needed for mild pain.    [provider]  ?albuterol (PROVENTIL) (2.5 MG/3ML) 0.083% nebulizer solution Take 3 mLs (2.5 mg total) by nebulization every 4 (four) hours as needed for shortness of breath or wheezing. ?Patient taking differently: Take 2.5 mg by nebulization every 4 (four) hours as needed for shortness of breath. 11/27/21   Osvaldo Shipper, MD  ?albuterol (VENTOLIN HFA) 108 (90 Base) MCG/ACT inhaler Inhale 1-2 puffs into the lungs every 6 (six) hours as needed for wheezing or shortness of breath. ?Patient taking differently: Inhale 2 puffs into the lungs every 6 (six) hours as needed for wheezing or shortness of breath. 11/27/21   Osvaldo Shipper, MD  ?Virgel Bouquet ELLIPTA  100-25 MCG/ACT AEPB Inhale 1 puff into the lungs daily. 11/20/21   [provider]  ?buPROPion (WELLBUTRIN) 75 MG tablet Take 75 mg by mouth daily.  06/13/20   [provider]  ?Cholecalciferol (VITAMIN D3) 25 MCG (1000 UT) CAPS Take 1 capsule (1,000 Units total) by mouth 2 (two) times daily. 11/27/21   Osvaldo Shipper, MD  ?clopidogrel (PLAVIX) 75 MG tablet Take 1 tablet (75 mg total) by mouth daily. 12/28/20   Ngetich, Donalee Citrin, NP  ?Coenzyme Q10 (CO Q 10) 60 MG CAPS Take 120 mg by mouth in the morning and at bedtime.    [provider]  ?COSOPT 22.3-6.8 MG/ML ophthalmic solution 1 drop 2 (two) times daily.    [provider]  ?cyanocobalamin 1000 MCG tablet Take 1,000 mcg by mouth daily.    [provider]  ?cyclobenzaprine (FLEXERIL) 10 MG tablet Take 1 tablet (10 mg total) by mouth 3 (three) times daily. Use with caution due to fall risk ?Patient taking differently: Take 10 mg by mouth daily as needed for muscle spasms (Use with caution due to fall risk). 02/09/21   Ngetich, Dinah C, NP  ?diclofenac Sodium (VOLTAREN) 1 % GEL Apply 2 g topically every 6 (six) hours as needed (for back pain).    [provider]  ?famotidine (PEPCID) 10 MG tablet Take 10 mg by mouth every 12 (twelve) hours as needed for heartburn.    [provider]  ?fluticasone (FLONASE) 50 MCG/ACT nasal spray Place 1 spray into both nostrils daily. ?Patient taking differently: Place 2 sprays into both nostrils daily as needed (for congestion). 01/18/21   Almon Hercules, MD  ?furosemide (LASIX) 20 MG tablet Take 20 mg by mouth every other day.    [provider]  ?guaiFENesin (MUCINEX) 600 MG 12 hr tablet Take 1 tablet (600 mg total) by mouth 2 (two) times daily. 02/09/21   Ngetich, Dinah C, NP  ?latanoprost (XALATAN) 0.005 % ophthalmic solution Place 1 drop into both eyes at bedtime.    [provider]  ?loperamide (IMODIUM A-D) 2 MG tablet Take 2 mg by mouth every 8 (eight)  hours as needed (for diarrhea). 07/23/21   [provider]  ?loratadine (CLARITIN) 10 MG tablet Take 1 tablet (10 mg total) by mouth daily. 01/19/21   Almon Hercules, MD  ?magnesium oxide (MAG-OX) 400 MG tablet Take 400 mg by mouth daily.    [provider]  ?Menthol, Topical Analgesic, (BIOFREEZE) 4 % GEL Apply 1 application topically every 12 (twelve) hours as needed (for right shoulder pain).    [provider]  ?mirtazapine (REMERON) 15 MG tablet Take 1 tablet (15 mg total) by mouth at  bedtime. 02/09/21   Ngetich, Dinah C, NP  ?oxyCODONE (ROXICODONE) 5 MG/5ML solution Take 2 mLs (2 mg total) by mouth every 4 (four) hours as needed for up to 6 doses (shortness of breath). 12/03/21   Lanae BoastKc, Ramesh, MD  ?OXYGEN Inhale 3-4 L/min into the lungs continuous. Inhale 3 L/min of oxygen into the lungs continuously and increase to 4 L/min when ambulating or sats drop below 90%    [provider]  ?pantoprazole (PROTONIX) 40 MG tablet Take 40 mg by mouth daily. 11/01/20   [provider]  ?polyethylene glycol (MIRALAX / GLYCOLAX) 17 g packet Take 17 g by mouth 2 (two) times daily as needed for mild constipation. ?Patient taking differently: Take 17 g by mouth daily as needed for mild constipation. 01/18/21   Almon HerculesGonfa, Taye T, MD  ?QUEtiapine (SEROQUEL) 25 MG tablet Take 1 tablet (25 mg total) by mouth at bedtime. 03/28/21   York SpanielWillis, Charles K, MD  ?RHOPRESSA 0.02 % SOLN Place 1 drop into both eyes at bedtime. 11/18/21   [provider]  ?SYNTHROID 137 MCG tablet Take 68.5 mcg by mouth daily before breakfast.    [provider]  ?SYSTANE 0.4-0.3 % GEL ophthalmic gel Place 1 application into both eyes 3 (three) times daily.    [provider]  ?SYSTANE 0.4-0.3 % SOLN Place 1 drop into both eyes 4 (four) times daily.    [provider]  ? ?CT Lumbar Spine Wo Contrast ? ?Result Date: 01/27/2022 ?CLINICAL DATA:  Back trauma, no prior imaging (Age >= 16y). Hip trauma,  fracture suspected, xray done EXAM: CT LUMBAR SPINE WITHOUT CONTRAST TECHNIQUE: Multidetector CT imaging of the lumbar spine was performed without intravenous contrast administration. Multiplanar CT image

## 2022-01-27 NOTE — ED Triage Notes (Signed)
Pt BIB [facility] from [facility]. Pt had a mechanical fall yesterday at 1100. Pt takes plavix and hit head without obvious injury. Pt endorses worsening L hip pain today with movement, denies pain at rest. No shortening or rotation noted to L leg. Pt also endorses burning with urination that started yesterday. Pt is a DNR/Hospice pt. On 4L  via [place] at baseline. ?EMS VS- BP 120/76, HR 74 ?

## 2022-01-28 DIAGNOSIS — S72002A Fracture of unspecified part of neck of left femur, initial encounter for closed fracture: Secondary | ICD-10-CM | POA: Diagnosis not present

## 2022-01-28 DIAGNOSIS — F32A Depression, unspecified: Secondary | ICD-10-CM

## 2022-01-28 DIAGNOSIS — F419 Anxiety disorder, unspecified: Secondary | ICD-10-CM | POA: Diagnosis not present

## 2022-01-28 MED ORDER — ORAL CARE MOUTH RINSE
15.0000 mL | Freq: Two times a day (BID) | OROMUCOSAL | Status: DC
  Administered 2022-01-28 – 2022-01-31 (×7): 15 mL via OROMUCOSAL

## 2022-01-28 MED ORDER — ADULT MULTIVITAMIN W/MINERALS CH
1.0000 | ORAL_TABLET | Freq: Every day | ORAL | Status: DC
  Administered 2022-01-28 – 2022-01-31 (×4): 1 via ORAL
  Filled 2022-01-28 (×4): qty 1

## 2022-01-28 MED ORDER — CLONAZEPAM 0.25 MG PO TBDP: 0.2500 mg | ORAL_TABLET | Freq: Two times a day (BID) | ORAL | 0 refills | Status: DC

## 2022-01-28 MED ORDER — IPRATROPIUM-ALBUTEROL 0.5-2.5 (3) MG/3ML IN SOLN
3.0000 mL | Freq: Four times a day (QID) | RESPIRATORY_TRACT | Status: DC
  Administered 2022-01-28 – 2022-01-29 (×3): 3 mL via RESPIRATORY_TRACT
  Filled 2022-01-28 (×3): qty 3

## 2022-01-28 MED ORDER — ALBUTEROL SULFATE (2.5 MG/3ML) 0.083% IN NEBU: 2.5000 mg | INHALATION_SOLUTION | Freq: Four times a day (QID) | RESPIRATORY_TRACT | Status: DC | PRN

## 2022-01-28 MED ORDER — BISACODYL 5 MG PO TBEC
5.0000 mg | DELAYED_RELEASE_TABLET | Freq: Every day | ORAL | 0 refills | Status: AC | PRN
Start: 2022-01-28 — End: ?

## 2022-01-28 MED ORDER — CLONAZEPAM 0.25 MG PO TBDP
0.2500 mg | ORAL_TABLET | Freq: Two times a day (BID) | ORAL | Status: DC
Start: 2022-01-28 — End: 2022-01-31
  Administered 2022-01-28 – 2022-01-31 (×7): 0.25 mg via ORAL
  Filled 2022-01-28 (×7): qty 1

## 2022-01-28 MED ORDER — ENSURE ENLIVE PO LIQD
237.0000 mL | Freq: Two times a day (BID) | ORAL | Status: DC
  Administered 2022-01-29 – 2022-01-31 (×5): 237 mL via ORAL

## 2022-01-28 MED ORDER — OXYCODONE HCL 5 MG PO TABS: 5.0000 mg | ORAL_TABLET | Freq: Four times a day (QID) | ORAL | 0 refills | Status: DC

## 2022-01-28 NOTE — Plan of Care (Signed)

## 2022-01-28 NOTE — Progress Notes (Addendum)
? ?  This pt is currently active with [facility]. This is a related hospitalization. After speaking with the family over the weekend. Ferrilyn (pt's daughter) is requesting that pt go to a SNF in [place] if available. She is aware that if pt goes to use skilled nursing day she will need to revoke her mothers hospice benefit to pursue this. If pt is not a candidate for skilled therapy and will be going there for higher level of care instead of back to ALF where she come from [facility]) she can continue with current hospice services.  ? ?Please reach out with questions. We will continue to follow until disposition is decided.  ? ?Our SW had began the process last week for a SNF due to decline. The family at that time was interested in [facility] and [facility]. I have updated Tammy Sours in [facility] on the family choice and he is aware and will follow up with family.  ? ?Norm Parcel RN [phone]  ? ? ?

## 2022-01-28 NOTE — TOC CAGE-AID Note (Signed)
Transition of Care (TOC) - CAGE-AID Screening ? ? ?Patient Details  ?Name: Sheila Mccoy ?MRN: [id number] ?Date of Birth: 1931-04-26 ? ?Transition of Care (TOC) CM/SW Contact:    ?Tashica Provencio C Tarpley-Carter, LCSWA ?Phone Number: ?01/28/2022, 1:00 PM ? ? ?Clinical Narrative: ?Pt participated in Cage-Aid.  Pts daughter stated pt does not use substance or ETOH.  Pt was not offered resources, due to no usage of substance or ETOH.    ? ?Insurance underwriter, MSW, LCSW-A ?Pronouns:  She/Her/Hers ?[facility] ?Clinical Social Worker ?Direct Number:  [phone] ?[email or web address] ? ?CAGE-AID Screening: ?  ? ?Have You Ever Felt You Ought to Cut Down on Your Drinking or Drug Use?: No ?Have People Annoyed You By Critizing Your Drinking Or Drug Use?: No ?Have You Felt Bad Or Guilty About Your Drinking Or Drug Use?: No ?Have You Ever Had a Drink or Used Drugs First Thing In The Morning to Steady Your Nerves or to Get Rid of a Hangover?: No ?CAGE-AID Score: 0 ? ?Substance Abuse Education Offered: No ? ?  ? ? ? ? ? ? ?

## 2022-01-28 NOTE — Progress Notes (Signed)
Initial Nutrition Assessment ? ?DOCUMENTATION CODES:  ? ?Not applicable ? ?INTERVENTION:  ? ?Ensure Enlive po BID, each supplement provides 350 kcal and 20 grams of protein. ? ?MVI with minerals daily. ? ?Liberalize diet to regular, room service with assistance.  ? ?NUTRITION DIAGNOSIS:  ? ?Increased nutrient needs related to hip fracture as evidenced by estimated needs. ? ?GOAL:  ? ?Patient will meet greater than or equal to 90% of their needs ? ?MONITOR:  ? ?PO intake, Supplement acceptance, Labs ? ?REASON FOR ASSESSMENT:  ? ?Consult ?Hip fracture protocol ? ?ASSESSMENT:  ? ?88 yo female admitted with L hip fracture S/P mechanical fall at ALF. PMH includes advanced COPD on 4 L oxygen at home, severe anxiety, hypothyroidism, CKD stage IIIa, CHF, chronic ambulatory dysfunction. ? ?No orthopedic surgery was recommended on admission. She is receiving physical therapy, WBAT. ? ?Patient reports that she was eating well PTA. Since admission she has been eating poorly "because they have the same thing everyday."  She likes chocolate PO supplements. RD to order Ensure BID between meals. Per daughter, patient needs assistance with ordering meals. Will liberalize diet to regular and change to room service yes with assistance.  ? ?Patient is currently active with [facility].  ? ?Labs reviewed.  ?Medications reviewed and include cholecalciferol, Lasix, Remeron, vitamin B-12. ? ?No recent height or weight available. Most recent reading is from 11/29/21. ? ?NUTRITION - FOCUSED PHYSICAL EXAM: ? ?Unable to complete, RN and tech in room to provide care. ? ?Diet Order:   ?Diet Order   ? ?       ?  Diet Heart Room service appropriate? Yes; Fluid consistency: Thin; Fluid restriction: 1200 mL Fluid  Diet effective now       ?  ? ?  ?  ? ?  ? ? ?EDUCATION NEEDS:  ? ?No education needs have been identified at this time ? ?Skin:  Skin Assessment: Reviewed RN Assessment ? ?Last BM:  no BM documented ? ?Height:  ? ?Ht Readings  from Last 1 Encounters:  ?11/29/21 5\' 3"  (1.6 m)  ? ? ?Weight:  ? ?Wt Readings from Last 1 Encounters:  ?11/29/21 65.8 kg  ? ? ?BMI:  25.7 (using above height and weight) ? ?Estimated Nutritional Needs:  ? ?Kcal:  1700-1900 ? ?Protein:  80-95 gm ? ?Fluid:  >/= 1.8 L ? ? ?Lucas Mallow RD, LDN, CNSC ?Please refer to Amion for contact information.                                                       ? ?

## 2022-01-28 NOTE — Evaluation (Signed)
Physical Therapy Evaluation ?Patient Details ?Name: Sheila Mccoy ?MRN: [id number] ?DOB: 05-Jan-1931 ?Today's Date: 01/28/2022 ? ?History of Present Illness ? 88 y.o. female presented with mechanical fall and left hip fracture. Pt is current with Hospice for her COPD, and per Orthopedic consult, no orthopedic sx and WBAT. PMH: advanced COPD with chronic hypoxic respite failure on home oxygen, severe anxiety on narcotics, hypothyroidism, CKD stage IIIa, chronic diastolic CHF, chronic ambulation dysfunction baseline with roller walker plus wheelchair,  ?Clinical Impression ? PTA pt living at [facility], using RW for ambulation in room and w/c for longer distance mobility. Facility provides setup for bathing and dressing, meals and meds management. Pt is limited in safe mobility today by increased pain with L LE pain with movement and weightbearing. Pt is max A for bed mobility    ?   ? ?Recommendations for follow up therapy are one component of a multi-disciplinary discharge planning process, led by the attending physician.  Recommendations may be updated based on patient status, additional functional criteria and insurance authorization. ? ?Follow Up Recommendations Skilled nursing-short term rehab (<3 hours/day) ? ?  ?Assistance Recommended at Discharge Frequent or constant Supervision/Assistance  ?Patient can return home with the following ? Two people to help with walking and/or transfers;Two people to help with bathing/dressing/bathroom;Assistance with cooking/housework;Assistance with feeding;Direct supervision/assist for medications management;Direct supervision/assist for financial management;Assist for transportation;Help with stairs or ramp for entrance ? ?  ?Equipment Recommendations  (has necessary equipment)  ?Recommendations for Other Services ? OT consult  ?  ?Functional Status Assessment Patient has had a recent decline in their functional status and demonstrates the ability to make significant  improvements in function in a reasonable and predictable amount of time.  ? ?  ?Precautions / Restrictions Precautions ?Precautions: Fall ?Restrictions ?Weight Bearing Restrictions: Yes ?LLE Weight Bearing: Weight bearing as tolerated  ? ?  ? ?Mobility ? Bed Mobility ?Overal bed mobility: Needs Assistance ?Bed Mobility: Supine to Sit ?  ?  ?Supine to sit: HOB elevated, Max assist ?  ?  ?General bed mobility comments: attempted to have pt offweight hip by pushing R foot into bed to lip hips to ease L hip movement, able to tolerate one scoot with mod A for L LE movement, could not tolerate more and requires total A for scooting hips to EOB and max A for bringing trunk to upright ?  ? ?Transfers ?Overall transfer level: Needs assistance ?Equipment used: Rolling walker (2 wheels) ?Transfers: Sit to/from Stand, Bed to chair/wheelchair/BSC ?Sit to Stand: Mod assist, From elevated surface ?  ?Step pivot transfers: Max assist ?  ?  ?  ?General transfer comment: modA for power up and steadying in standing, increased pain with weightbearing through L hip, max A for steadying with stepping from bed to recliner, maximal cuing for sequencing and not sitting before she was in front fo chair ?  ? ?Ambulation/Gait ?  ?  ?  ?  ?  ?  ?  ?General Gait Details: unable due to pain ? ? ?  ? ?Balance Overall balance assessment: Needs assistance ?Sitting-balance support: Feet supported, Bilateral upper extremity supported ?Sitting balance-Leahy Scale: Fair ?  ?  ?Standing balance support: Reliant on assistive device for balance, During functional activity ?Standing balance-Leahy Scale: Poor ?Standing balance comment: needs AD and outside support for balance. ?  ?  ?  ?  ?  ?  ?  ?  ?  ?  ?  ?   ? ? ? ?Pertinent Vitals/Pain  Pain Assessment ?Pain Assessment: 0-10 ?Pain Score: 5  ?Pain Location: L hip with movement/weightbearing ?Pain Descriptors / Indicators: Lambert Mody, Shooting ?Pain Intervention(s): Limited activity within patient's tolerance,  Monitored during session, Repositioned  ? ? ?Home Living Family/patient expects to be discharged to:: Assisted living ?  ?  ?  ?  ?  ?  ?  ?  ?Home Equipment: Rollator (4 wheels);Wheelchair - manual ?Additional Comments: [facility]  ?  ?Prior Function Prior Level of Function : Needs assist ?  ?  ?  ?Physical Assist : Mobility (physical);ADLs (physical) ?Mobility (physical): Gait ?ADLs (physical): IADLs (set up) ?Mobility Comments: ambulates in her room with RW and use WC to get to dining room for meals, either self propelled or pushed ?ADLs Comments: reports independence with bathing and dressing with set up, facility provides for all iADLs including med management ?  ? ? ?   ?Extremity/Trunk Assessment  ? Upper Extremity Assessment ?Upper Extremity Assessment: Overall WFL for tasks assessed ?  ? ?Lower Extremity Assessment ?Lower Extremity Assessment: RLE deficits/detail;LLE deficits/detail ?RLE Deficits / Details: AROM WFL, strength grossly 3+/5 ?RLE Sensation: WNL ?RLE Coordination: WNL ?LLE Deficits / Details: PROM limited by pain for knee and hip movement ?LLE: Unable to fully assess due to pain ?LLE Coordination: decreased fine motor;decreased gross motor ?  ? ?   ?Communication  ? Communication: HOH  ?Cognition Arousal/Alertness: Awake/alert ?Behavior During Therapy: [facility] for tasks assessed/performed ?Overall Cognitive Status: Within Functional Limits for tasks assessed ?  ?  ?  ?  ?  ?  ?  ?  ?  ?  ?  ?  ?  ?  ?  ?  ?  ?  ?  ? ?  ?General Comments General comments (skin integrity, edema, etc.): Pt on 3L O2 via [place] on entry SpO2 96%O2, with stepping to recliner HR to 130s, and SpO2 dropped to 82%O2, once seated with cues for deep breathing quickly recovered to 93%O2 ? ?  ?   ? ?Assessment/Plan  ?  ?PT Assessment Patient needs continued PT services  ?PT Problem List Decreased strength;Decreased range of motion;Decreased activity tolerance;Decreased balance;Decreased mobility;Decreased coordination;Decreased  safety awareness;Pain ? ?   ?  ?PT Treatment Interventions DME instruction;Gait training;Functional mobility training;Therapeutic activities;Therapeutic exercise;Balance training;Cognitive remediation;Patient/family education   ? ?PT Goals (Current goals can be found in the Care Plan section)  ?Acute Rehab PT Goals ?Patient Stated Goal: walk ?PT Goal Formulation: With patient ?Time For Goal Achievement: 02/11/22 ?Potential to Achieve Goals: Fair ? ?  ?Frequency Min 3X/week ?  ? ? ?   ?AM-PAC PT "6 Clicks" Mobility  ?Outcome Measure Help needed turning from your back to your side while in a flat bed without using bedrails?: A Lot ?Help needed moving from lying on your back to sitting on the side of a flat bed without using bedrails?: Total ?Help needed moving to and from a bed to a chair (including a wheelchair)?: Total ?Help needed standing up from a chair using your arms (e.g., wheelchair or bedside chair)?: Total ?Help needed to walk in hospital room?: Total ?Help needed climbing 3-5 steps with a railing? : Total ?6 Click Score: 7 ? ?  ?End of Session Equipment Utilized During Treatment: Gait belt;Oxygen ?Activity Tolerance: Patient limited by pain ?Patient left: in chair;with call bell/phone within reach;with chair alarm set ?Nurse Communication: Mobility status ?PT Visit Diagnosis: Unsteadiness on feet (R26.81);Other abnormalities of gait and mobility (R26.89);History of falling (Z91.81);Muscle weakness (generalized) (M62.81);Difficulty in walking, not elsewhere  classified (R26.2);Pain ?  ? ?Time: [id number] ?PT Time Calculation (min) (ACUTE ONLY): 25 min ? ? ?Charges:   PT Evaluation ?$PT Eval Moderate Complexity: 1 Mod ?PT Treatments ?$Therapeutic Activity: 8-22 mins ?  ?   ? ? ?Kindle Strohmeier B. Migdalia Dk PT, DPT ?Acute Rehabilitation Services ?Pager [phone] ?Office [phone] ? ? ?[place] ?01/28/2022, 12:47 PM ? ?

## 2022-01-28 NOTE — Progress Notes (Signed)
?PROGRESS NOTE ? ? ? ?Sheila Mccoy  [id number] DOB: 1931-05-29 DOA: 01/27/2022 ?PCP: Pcp, No  ? ? ?Brief Narrative:  ?88 year old with history of advanced COPD and chronic hypoxemic respiratory failure on 4 L oxygen at home, severe anxiety on narcotics, hypothyroidism, CKD stage IIIa, chronic diastolic dysfunction, chronic ambulatory dysfunction from assisted living facility presented with mechanical fall and left hip fracture.  She is also with hospice follow-up at assisted living facility.  Brought to the emergency room and she was found to have minimally displaced left trochanteric fracture.  She was evaluated by orthopedics, recommended pain management and therapies. ? ? ?Assessment & Plan: ?  ?Close traumatic left greater trochanteric fracture: ?Seen by orthopedics, nonoperative management advised because of patient's poor mobility and advanced COPD. ?Pain medications, referred Tylenol and oxycodone.  Avoid morphine or Dilaudid.  Continue laxatives. ?Mobilize with PT OT, weightbearing as tolerated as per orthopedics. ?She needs to go to short-term rehab before transitioning back to assisted living facility. ?DVT prophylaxis with Lovenox. ? ?Chronic hypoxemic respiratory failure due to COPD: ?Patient on a scheduled treatment, optimized on COPD treatment along with 4 L of oxygen at rest.  Currently stable.  Continue bronchodilator therapy, steroid inhalers.  Keep on oxygen to keep saturations more than 89%.  Patient is on Lasix and euvolemic. ? ?Hypothyroidism: On Synthroid. ? ?Chronic medical management with hospice care: Patient needing a skilled level of care.  Referred to short-term rehab and ultimate reengaged with hospice once she is able to go back to assisted living facility or long-term care. ? ? ?DVT prophylaxis: enoxaparin (LOVENOX) injection 30 mg Start: 01/27/22 1700 ? ? ?Code Status: DNR ?Family Communication: Daughter on the phone ?Disposition Plan: Status is: Inpatient ?Remains inpatient  appropriate because: Unsafe discharge plan ?  ? ? ?Consultants:  ?Orthopedics ? ?Procedures:  ?None ? ?Antimicrobials:  ?None ? ? ?Subjective: ?Patient seen and examined.  At rest, she denies any complaints.  She is looking forward to mobilize at least out of the bed.  Denies any shortness of breath and at about her baseline. ? ?Objective: ?Vitals:  ? 01/27/22 2101 01/28/22 0541 01/28/22 0726 01/28/22 1314  ?BP: (!) 129/59 (!) 99/42 105/82   ?Pulse: 78 69 72   ?Resp: 18 18 18    ?Temp: 98 ?F (36.7 ?C) (!) 97 ?F (36.1 ?C) 98 ?F (36.7 ?C)   ?TempSrc: Axillary Axillary Oral   ?SpO2: 99% 98% 94% 96%  ? ? ?Intake/Output Summary (Last 24 hours) at 01/28/2022 1322 ?Last data filed at 01/28/2022 [phone] ?Gross per 24 hour  ?Intake 480 ml  ?Output --  ?Net 480 ml  ? ?There were no vitals filed for this visit. ? ?Examination: ? ?General exam: Appears calm and comfortable  ?Frail and debilitated but not in any distress. ?Respiratory system: Clear to auscultation. Respiratory effort normal.  Patient was on 4 L of oxygen. ?Cardiovascular system: S1 & S2 heard, RRR. Marland Kitchen ?Gastrointestinal system: Soft.  Nontender.  Bowel sound present. ?Central nervous system: Alert and oriented. No focal neurological deficits. ?Extremities: Symmetric 5 x 5 power.  Generalized weakness. ?She has some point tenderness on the left hip without deformity. ? ? ? ?Data Reviewed: I have personally reviewed following labs and imaging studies ? ?CBC: ?Recent Labs  ?Lab 01/27/22 ?1455  ?WBC 15.4*  ?HGB 13.6  ?HCT 42.8  ?MCV 90.7  ?PLT 389  ? ?Basic Metabolic Panel: ?Recent Labs  ?Lab 01/27/22 ?1455  ?NA 135  ?K 4.2  ?CL 97*  ?CO2 26  ?  GLUCOSE 120*  ?BUN 21  ?CREATININE 0.89  ?CALCIUM 9.6  ? ?GFR: ?CrCl cannot be calculated (Unknown ideal weight.). ?Liver Function Tests: ?No results for input(s): AST, ALT, ALKPHOS, BILITOT, PROT, ALBUMIN in the last 168 hours. ?No results for input(s): LIPASE, AMYLASE in the last 168 hours. ?No results for input(s): AMMONIA in the  last 168 hours. ?Coagulation Profile: ?No results for input(s): INR, PROTIME in the last 168 hours. ?Cardiac Enzymes: ?No results for input(s): CKTOTAL, CKMB, CKMBINDEX, TROPONINI in the last 168 hours. ?BNP (last 3 results) ?No results for input(s): PROBNP in the last 8760 hours. ?HbA1C: ?No results for input(s): HGBA1C in the last 72 hours. ?CBG: ?No results for input(s): GLUCAP in the last 168 hours. ?Lipid Profile: ?No results for input(s): CHOL, HDL, LDLCALC, TRIG, CHOLHDL, LDLDIRECT in the last 72 hours. ?Thyroid Function Tests: ?No results for input(s): TSH, T4TOTAL, FREET4, T3FREE, THYROIDAB in the last 72 hours. ?Anemia Panel: ?No results for input(s): VITAMINB12, FOLATE, FERRITIN, TIBC, IRON, RETICCTPCT in the last 72 hours. ?Sepsis Labs: ?No results for input(s): PROCALCITON, LATICACIDVEN in the last 168 hours. ? ?No results found for this or any previous visit (from the past 240 hour(s)).  ? ? ? ? ? ?Radiology Studies: ?CT Lumbar Spine Wo Contrast ? ?Result Date: 01/27/2022 ?CLINICAL DATA:  Back trauma, no prior imaging (Age >= 16y). Hip trauma, fracture suspected, xray done EXAM: CT LUMBAR SPINE WITHOUT CONTRAST TECHNIQUE: Multidetector CT imaging of the lumbar spine was performed without intravenous contrast administration. Multiplanar CT image reconstructions were also generated. RADIATION DOSE REDUCTION: This exam was performed according to the departmental dose-optimization program which includes automated exposure control, adjustment of the mA and/or kV according to patient size and/or use of iterative reconstruction technique. COMPARISON:  CT lumbar spine 07/09/2020 FINDINGS: Segmentation: 5 lumbar type vertebrae. Alignment: Normal. Vertebrae: Interval worsening of a chronic L1 compression fracture with now up to 60% height loss anteriorly. Associated old nonunionized right L1 transverse process fracture and 4 mm retropulsion into the central canal. Multilevel severe degenerative changes of the  spine with no evidence of associated severe osseous neural foraminal or osseous central canal stenosis. No focal pathologic process. Old healed S4-S5 sacral fracture. Paraspinal and other soft tissues: Negative. Disc levels: Intervertebral disc space vacuum phenomenon at the L1-L2, L2-L3, L3-L4, L4-L5 levels. Other: Atherosclerotic plaque.  Colonic diverticulosis. IMPRESSION: 1. Interval worsening of a chronic L1 compression fracture with now up to 60% height loss anteriorly. Associated 4 mm retropulsion into the central canal. Correlate with point tenderness to palpation to evaluate for an acute component. 2.  Aortic Atherosclerosis (ICD10-I70.0). 3. Please see separately dictated CT left hip 01/27/2022. Electronically Signed   By: Iven Finn M.D.   On: 01/27/2022 15:13  ? ?CT Hip Left Wo Contrast ? ?Result Date: 01/27/2022 ?CLINICAL DATA:  Hip trauma, fracture suspected, xray done trochanteric deformity EXAM: CT OF THE LEFT HIP WITHOUT CONTRAST TECHNIQUE: Multidetector CT imaging of the left hip was performed according to the standard protocol. Multiplanar CT image reconstructions were also generated. RADIATION DOSE REDUCTION: This exam was performed according to the departmental dose-optimization program which includes automated exposure control, adjustment of the mA and/or kV according to patient size and/or use of iterative reconstruction technique. COMPARISON:  CT pelvis 07/09/2020 FINDINGS: Bones/Joint/Cartilage Minimally displaced greater trochanter fracture with extension to the lateral left femoral neck (9:54). No definite extension to the lesser trochanter. No acute displaced fracture of the left pelvis. No hip dislocation. Old healed S4-S5 sacral fracture. No  aggressive appearing osseous lesion. No severe degenerative changes of the left hip. Degenerative changes of the pubic symphysis. No joint effusion. Ligaments Suboptimally assessed by CT. Muscles and Tendons Grossly unremarkable. Soft tissues No  large hematoma formation. Other: Atherosclerotic plaque.  Scattered colonic diverticulosis. IMPRESSION: Minimally displaced greater trochanter fracture with extension to the lateral left femoral neck. Electroni

## 2022-01-29 DIAGNOSIS — F419 Anxiety disorder, unspecified: Secondary | ICD-10-CM | POA: Diagnosis not present

## 2022-01-29 DIAGNOSIS — F32A Depression, unspecified: Secondary | ICD-10-CM | POA: Diagnosis not present

## 2022-01-29 DIAGNOSIS — S72002A Fracture of unspecified part of neck of left femur, initial encounter for closed fracture: Secondary | ICD-10-CM | POA: Diagnosis not present

## 2022-01-29 MED ORDER — FLEET ENEMA 7-19 GM/118ML RE ENEM
1.0000 | ENEMA | Freq: Once | RECTAL | Status: AC
Start: 2022-01-30 — End: 2022-01-29
  Administered 2022-01-29: 1 via RECTAL
  Filled 2022-01-29: qty 1

## 2022-01-29 MED ORDER — IPRATROPIUM-ALBUTEROL 0.5-2.5 (3) MG/3ML IN SOLN
3.0000 mL | Freq: Two times a day (BID) | RESPIRATORY_TRACT | Status: DC
  Administered 2022-01-29 – 2022-01-30 (×3): 3 mL via RESPIRATORY_TRACT
  Filled 2022-01-29 (×5): qty 3

## 2022-01-29 MED ORDER — POLYETHYLENE GLYCOL 3350 17 G PO PACK
17.0000 g | PACK | Freq: Once | ORAL | Status: AC | PRN
  Administered 2022-01-29: 17 g via ORAL
  Filled 2022-01-29: qty 1

## 2022-01-29 NOTE — Progress Notes (Signed)
Physical Therapy Treatment ?Patient Details ?Name: Sheila Mccoy ?MRN: [id number] ?DOB: 07-Jun-1931 ?Today's Date: 01/29/2022 ? ? ?History of Present Illness Pt is an 88 y.o. female admitted from ALF on 01/27/22 after fall sustaining L hip fx. Ofnote, pt is hospice patient with advanced COPD; per ortho, plan for non-operative management of L hip fx. PMH includes advanced COPD (on home O2), severe anxiety on narcotics, hypothyroidism, CKD stage IIIa, chronic diastolic CHF, chronic ambulation dysfunction. ?  ?PT Comments  ? ? Pt progressing with mobility. Today's session focused on transfer training, pre-gait activity and LE therex; pt requiring consistent modA; pt having difficulty progressing ambulation secondary to c/o L hip pain in WB and fearful of falling. Pt remains limited by generalized weakness, decreased activity tolerance, poor balance strategies/postural reactions and impaired cognition. Continue to recommend SNF-level therapies to maximize functional mobility and independence prior to return home. ?   ?Recommendations for follow up therapy are one component of a multi-disciplinary discharge planning process, led by the attending physician.  Recommendations may be updated based on patient status, additional functional criteria and insurance authorization. ? ?Follow Up Recommendations ? Skilled nursing-short term rehab (<3 hours/day) ?  ?  ?Assistance Recommended at Discharge Frequent or constant Supervision/Assistance  ?Patient can return home with the following Two people to help with walking and/or transfers;Two people to help with bathing/dressing/bathroom;Assistance with cooking/housework;Assistance with feeding;Direct supervision/assist for medications management;Direct supervision/assist for financial management;Assist for transportation;Help with stairs or ramp for entrance ?  ?Equipment Recommendations ? None recommended by PT  ?  ?Recommendations for Other Services   ? ? ?  ?Precautions / Restrictions  Precautions ?Precautions: Fall;Other (comment) ?Precaution Comments: wears 2-4L O2 baseline ?Restrictions ?Weight Bearing Restrictions: Yes ?LLE Weight Bearing: Weight bearing as tolerated  ?  ? ?Mobility ? Bed Mobility ?Overal bed mobility: Needs Assistance ?Bed Mobility: Supine to Sit, Sit to Supine ?  ?  ?Supine to sit: Mod assist, HOB elevated ?Sit to supine: Mod assist ?  ?General bed mobility comments: increased time and effort with cues for sequencing bed mobility, including BLE movement and use of bed rail, modA for scooting hips to EOB, minA for trunk elevation for elevated HOB; modA for return to supine as pt's hips starting to slide from EOB ?  ? ?Transfers ?Overall transfer level: Needs assistance ?Equipment used: Rolling walker (2 wheels) ?Transfers: Sit to/from Stand ?Sit to Stand: Mod assist, From elevated surface ?  ?  ?  ?  ?  ?General transfer comment: performed 3x sit<>stands from elevated HOB, initially with good hand placement, requiring increased cues for this and sequencing on subsequent trials (suspect related to fatigue); endorses LLE pain with weight bearing ?  ? ?Ambulation/Gait ?Ambulation/Gait assistance: Mod assist ?  ?Assistive device: Rolling walker (2 wheels) ?Gait Pattern/deviations: Step-to pattern, Trunk flexed, Decreased weight shift to left, Antalgic ?  ?  ?Pre-gait activities: pt with difficulty accepting weight onto LLE in order to take step with R foot while holding RW; performed side steps towards HOB (L foot stepping, R foot sliding) with external assist for RW management and repeated verbal cues for sequencing and upright posture ?  ? ? ?Stairs ?  ?  ?  ?  ?  ? ? ?Wheelchair Mobility ?  ? ?Modified Rankin (Stroke Patients Only) ?  ? ? ?  ?Balance Overall balance assessment: Needs assistance ?Sitting-balance support: Feet supported, Bilateral upper extremity supported, Single extremity supported ?Sitting balance-Leahy Scale: Fair ?Sitting balance - Comments: repeated cues  for upright posture;  after last stand, pt with difficulty repositioning hips to scoot back on EOB requiring assist to lay supine in order to prevent fall from EOB ?  ?Standing balance support: Reliant on assistive device for balance, During functional activity, Bilateral upper extremity supported ?Standing balance-Leahy Scale: Poor ?Standing balance comment: reliant on BUE support and external assist for static standing ?  ?  ?  ?  ?  ?  ?  ?  ?  ?  ?  ?  ? ?  ?Cognition Arousal/Alertness: Awake/alert ?Behavior During Therapy: [facility] for tasks assessed/performed, Flat affect ?Overall Cognitive Status: No family/caregiver present to determine baseline cognitive functioning ?  ?  ?  ?  ?  ?  ?  ?  ?  ?  ?  ?  ?  ?  ?  ?  ?General Comments: following majority of simple commands and seemingly answering questions appropriately; decreased problem solving, increased time for processing ?  ?  ? ?  ?Exercises General Exercises - Lower Extremity ?Ankle Circles/Pumps: AROM, Both, Supine ?Short Arc Quad: AROM, Both, Supine ?Heel Slides: AROM, Both, Supine ?Hip ABduction/ADduction: AAROM, Left, Supine ?Straight Leg Raises: PROM, Left, Supine ? ?  ?General Comments General comments (skin integrity, edema, etc.): SpO2 93% on 4L O2 with activity ?  ?  ? ?Pertinent Vitals/Pain Pain Assessment ?Pain Assessment: Faces ?Faces Pain Scale: Hurts even more ?Pain Location: L hip with movement/weightbearing ?Pain Descriptors / Indicators: Aching, Guarding, Sore, Moaning ?Pain Intervention(s): Monitored during session, Limited activity within patient's tolerance  ? ? ?Home Living Family/patient expects to be discharged to:: Assisted living ?  ?  ?  ?  ?  ?  ?  ?  ?Home Equipment: Rollator (4 wheels);Wheelchair - manual ?Additional Comments: [facility]; Pt reports using a lift chair at ALF  ?  ?Prior Function    ?  ?  ?   ? ?PT Goals (current goals can now be found in the care plan section) Progress towards PT goals: Progressing toward goals  (slowly) ? ?  ?Frequency ? ? ? Min 3X/week ? ? ? ?  ?PT Plan Current plan remains appropriate  ? ? ?Co-evaluation   ?  ?  ?  ?  ? ?  ?AM-PAC PT "6 Clicks" Mobility   ?Outcome Measure ? Help needed turning from your back to your side while in a flat bed without using bedrails?: A Lot ?Help needed moving from lying on your back to sitting on the side of a flat bed without using bedrails?: A Lot ?Help needed moving to and from a bed to a chair (including a wheelchair)?: A Lot ?Help needed standing up from a chair using your arms (e.g., wheelchair or bedside chair)?: A Lot ?Help needed to walk in hospital room?: Total ?Help needed climbing 3-5 steps with a railing? : Total ?6 Click Score: 10 ? ?  ?End of Session Equipment Utilized During Treatment: Gait belt;Oxygen ?Activity Tolerance: Patient tolerated treatment well;Patient limited by pain ?Patient left: in bed;with call bell/phone within reach;with bed alarm set ?Nurse Communication: Mobility status ?PT Visit Diagnosis: Unsteadiness on feet (R26.81);Other abnormalities of gait and mobility (R26.89);History of falling (Z91.81);Muscle weakness (generalized) (M62.81);Difficulty in walking, not elsewhere classified (R26.2);Pain ?Pain - Right/Left: Left ?Pain - part of body: Hip ?  ? ? ?Time: [id number] ?PT Time Calculation (min) (ACUTE ONLY): 25 min ? ?Charges:  $Therapeutic Exercise: 8-22 mins ?$Therapeutic Activity: 8-22 mins          ?          ? ?  Mabeline Caras, PT, DPT ?Acute Rehabilitation Services  ?Pager [phone] ?Office [phone] ? ?Derry Lory ?01/29/2022, 12:38 PM ? ?

## 2022-01-29 NOTE — Progress Notes (Signed)
?PROGRESS NOTE ? ? ? ?Sheila Mccoy  [id number] DOB: [birth year] DOA: 01/27/2022 ?PCP: Pcp, No  ? ? ?Brief Narrative:  ?88 year old with history of advanced COPD and chronic hypoxemic respiratory failure on 4 L oxygen at home, severe anxiety on narcotics, hypothyroidism, CKD stage IIIa, chronic diastolic dysfunction, chronic ambulatory dysfunction from assisted living facility presented with mechanical fall and left hip fracture.  She is also with hospice follow-up at assisted living facility.  Brought to the emergency room and she was found to have minimally displaced left trochanteric fracture.  She was evaluated by orthopedics, recommended pain management and therapies. ? ? ?Assessment & Plan: ?  ?Close traumatic left greater trochanteric fracture: ?Seen by orthopedics, nonoperative management advised because of patient's poor mobility and advanced COPD. ?Pain medications, Tylenol and oxycodone.  Avoid morphine or Dilaudid.  Continue laxatives. ?Mobilize with PT OT, weightbearing as tolerated as per orthopedics. ?She needs to go to short-term rehab before transitioning back to assisted living facility. ?DVT prophylaxis with Lovenox. ? ?Chronic hypoxemic respiratory failure due to COPD: ?Patient on a scheduled treatment, optimized on COPD treatment along with 4 L of oxygen at rest.  Currently stable.  Continue bronchodilator therapy, steroid inhalers.  Keep on oxygen to keep saturations more than 89%.  Patient is on Lasix and euvolemic. ? ?Hypothyroidism: On Synthroid. ? ?Chronic medical management with hospice care: Patient needing a skilled level of care.  Referred to short-term rehab and ultimate reengaged with hospice once she is able to go back to assisted living facility or long-term care. ? ? ?DVT prophylaxis: enoxaparin (LOVENOX) injection 30 mg Start: 01/27/22 1700 ? ? ?Code Status: DNR ?Family Communication: Daughter on the phone 4/24 ?Disposition Plan: Status is: Inpatient ?Remains inpatient appropriate  because: Unsafe discharge plan, patient is medically stable to transfer to skilled level of care when bed is available. ?  ? ? ?Consultants:  ?Orthopedics ? ?Procedures:  ?None ? ?Antimicrobials:  ?None ? ? ?Subjective: ?Seen and examined.  Denies any complaints.  Painful to mobilize, however no trouble at rest.  Denies any cough wheezing or shortness of breath. ? ?Objective: ?Vitals:  ? 01/29/22 0256 01/29/22 [id number] 01/29/22 0801 01/29/22 0841  ?BP:  117/64  111/64  ?Pulse:  66    ?Resp:  16    ?Temp:  97.7 ?F (36.5 ?C)    ?TempSrc:  Oral    ?SpO2: 93% 96% 96% 96%  ? ? ?Intake/Output Summary (Last 24 hours) at 01/29/2022 1122 ?Last data filed at 01/28/2022 2130 ?Gross per 24 hour  ?Intake 120 ml  ?Output --  ?Net 120 ml  ? ? ?There were no vitals filed for this visit. ? ?Examination: ? ?General: Looks fairly comfortable.  Frail and debilitated. ?Cardiovascular: S1-S2 normal.  Regular rate rhythm. ?Respiratory: Bilateral clear.  Currently on 4 L oxygen. ?Gastrointestinal: Soft.  Nontender.  Bowel sound present. ?Ext: No edema or swelling.  Tenderness left hip.  No deformity. ?Neuro: Alert oriented x4.  Intact. ? ? ? ? ? ?Data Reviewed: I have personally reviewed following labs and imaging studies ? ?CBC: ?Recent Labs  ?Lab 01/27/22 ?1455  ?WBC 15.4*  ?HGB 13.6  ?HCT 42.8  ?MCV 90.7  ?PLT 389  ? ? ?Basic Metabolic Panel: ?Recent Labs  ?Lab 01/27/22 ?1455  ?NA 135  ?K 4.2  ?CL 97*  ?CO2 26  ?GLUCOSE 120*  ?BUN 21  ?CREATININE 0.89  ?CALCIUM 9.6  ? ? ?GFR: ?CrCl cannot be calculated (Unknown ideal weight.). ?Liver Function Tests: ?No results for  input(s): AST, ALT, ALKPHOS, BILITOT, PROT, ALBUMIN in the last 168 hours. ?No results for input(s): LIPASE, AMYLASE in the last 168 hours. ?No results for input(s): AMMONIA in the last 168 hours. ?Coagulation Profile: ?No results for input(s): INR, PROTIME in the last 168 hours. ?Cardiac Enzymes: ?No results for input(s): CKTOTAL, CKMB, CKMBINDEX, TROPONINI in the last 168  hours. ?BNP (last 3 results) ?No results for input(s): PROBNP in the last 8760 hours. ?HbA1C: ?No results for input(s): HGBA1C in the last 72 hours. ?CBG: ?No results for input(s): GLUCAP in the last 168 hours. ?Lipid Profile: ?No results for input(s): CHOL, HDL, LDLCALC, TRIG, CHOLHDL, LDLDIRECT in the last 72 hours. ?Thyroid Function Tests: ?No results for input(s): TSH, T4TOTAL, FREET4, T3FREE, THYROIDAB in the last 72 hours. ?Anemia Panel: ?No results for input(s): VITAMINB12, FOLATE, FERRITIN, TIBC, IRON, RETICCTPCT in the last 72 hours. ?Sepsis Labs: ?No results for input(s): PROCALCITON, LATICACIDVEN in the last 168 hours. ? ?No results found for this or any previous visit (from the past 240 hour(s)).  ? ? ? ? ? ?Radiology Studies: ?CT Lumbar Spine Wo Contrast ? ?Result Date: 01/27/2022 ?CLINICAL DATA:  Back trauma, no prior imaging (Age >= 16y). Hip trauma, fracture suspected, xray done EXAM: CT LUMBAR SPINE WITHOUT CONTRAST TECHNIQUE: Multidetector CT imaging of the lumbar spine was performed without intravenous contrast administration. Multiplanar CT image reconstructions were also generated. RADIATION DOSE REDUCTION: This exam was performed according to the departmental dose-optimization program which includes automated exposure control, adjustment of the mA and/or kV according to patient size and/or use of iterative reconstruction technique. COMPARISON:  CT lumbar spine 07/09/2020 FINDINGS: Segmentation: 5 lumbar type vertebrae. Alignment: Normal. Vertebrae: Interval worsening of a chronic L1 compression fracture with now up to 60% height loss anteriorly. Associated old nonunionized right L1 transverse process fracture and 4 mm retropulsion into the central canal. Multilevel severe degenerative changes of the spine with no evidence of associated severe osseous neural foraminal or osseous central canal stenosis. No focal pathologic process. Old healed S4-S5 sacral fracture. Paraspinal and other soft  tissues: Negative. Disc levels: Intervertebral disc space vacuum phenomenon at the L1-L2, L2-L3, L3-L4, L4-L5 levels. Other: Atherosclerotic plaque.  Colonic diverticulosis. IMPRESSION: 1. Interval worsening of a chronic L1 compression fracture with now up to 60% height loss anteriorly. Associated 4 mm retropulsion into the central canal. Correlate with point tenderness to palpation to evaluate for an acute component. 2.  Aortic Atherosclerosis (ICD10-I70.0). 3. Please see separately dictated CT left hip 01/27/2022. Electronically Signed   By: Iven Finn M.D.   On: 01/27/2022 15:13  ? ?CT Hip Left Wo Contrast ? ?Result Date: 01/27/2022 ?CLINICAL DATA:  Hip trauma, fracture suspected, xray done trochanteric deformity EXAM: CT OF THE LEFT HIP WITHOUT CONTRAST TECHNIQUE: Multidetector CT imaging of the left hip was performed according to the standard protocol. Multiplanar CT image reconstructions were also generated. RADIATION DOSE REDUCTION: This exam was performed according to the departmental dose-optimization program which includes automated exposure control, adjustment of the mA and/or kV according to patient size and/or use of iterative reconstruction technique. COMPARISON:  CT pelvis 07/09/2020 FINDINGS: Bones/Joint/Cartilage Minimally displaced greater trochanter fracture with extension to the lateral left femoral neck (9:54). No definite extension to the lesser trochanter. No acute displaced fracture of the left pelvis. No hip dislocation. Old healed S4-S5 sacral fracture. No aggressive appearing osseous lesion. No severe degenerative changes of the left hip. Degenerative changes of the pubic symphysis. No joint effusion. Ligaments Suboptimally assessed by CT. Muscles  and Tendons Grossly unremarkable. Soft tissues No large hematoma formation. Other: Atherosclerotic plaque.  Scattered colonic diverticulosis. IMPRESSION: Minimally displaced greater trochanter fracture with extension to the lateral left  femoral neck. Electronically Signed   By: Iven Finn M.D.   On: 01/27/2022 15:08  ? ?DG HIP UNILAT WITH PELVIS 2-3 VIEWS LEFT ? ?Result Date: 01/27/2022 ?CLINICAL DATA:  Fall, pain EXAM: DG HIP (WITH OR WITHOUT PE

## 2022-01-29 NOTE — Plan of Care (Signed)
  Problem: Education: Goal: Knowledge of General Education information will improve Description: Including pain rating scale, medication(s)/side effects and non-pharmacologic comfort measures Outcome: Not Progressing   Problem: Health Behavior/Discharge Planning: Goal: Ability to manage health-related needs will improve Outcome: Not Progressing   Problem: Clinical Measurements: Goal: Ability to maintain clinical measurements within normal limits will improve Outcome: Not Progressing Goal: Will remain free from infection Outcome: Not Progressing Goal: Diagnostic test results will improve Outcome: Not Progressing Goal: Respiratory complications will improve Outcome: Not Progressing Goal: Cardiovascular complication will be avoided Outcome: Not Progressing   Problem: Activity: Goal: Risk for activity intolerance will decrease Outcome: Not Progressing   Problem: Nutrition: Goal: Adequate nutrition will be maintained Outcome: Not Progressing   Problem: Coping: Goal: Level of anxiety will decrease Outcome: Not Progressing   Problem: Elimination: Goal: Will not experience complications related to bowel motility Outcome: Not Progressing Goal: Will not experience complications related to urinary retention Outcome: Not Progressing   Problem: Pain Managment: Goal: General experience of comfort will improve Outcome: Not Progressing   Problem: Safety: Goal: Ability to remain free from injury will improve Outcome: Not Progressing   Problem: Skin Integrity: Goal: Risk for impaired skin integrity will decrease Outcome: Not Progressing   Problem: Education: Goal: Verbalization of understanding the information provided (i.e., activity precautions, restrictions, etc) will improve Outcome: Not Progressing Goal: Individualized Educational Video(s) Outcome: Not Progressing   Problem: Activity: Goal: Ability to ambulate and perform ADLs will improve Outcome: Not Progressing    Problem: Clinical Measurements: Goal: Postoperative complications will be avoided or minimized Outcome: Not Progressing   Problem: Self-Concept: Goal: Ability to maintain and perform role responsibilities to the fullest extent possible will improve Outcome: Not Progressing   Problem: Pain Management: Goal: Pain level will decrease Outcome: Not Progressing   

## 2022-01-29 NOTE — TOC Initial Note (Addendum)
Transition of Care (TOC) - Initial/Assessment Note  ? ? ?Patient Details  ?Name: Sheila Mccoy ?MRN: [id number] ?Date of Birth: [birth year] ? ?Transition of Care [facility]) CM/SW Contact:    ?Sheila Chars, LCSW ?Phone Number: ?01/29/2022, 11:56 AM ? ?Clinical Narrative:   CSW spoke with pt regarding DC recommendation for SNF.  Pt agreeable to this, choice document given, permission given to send out referral in hub.  Permission given to speak with pt 2 daughters listed on face sheet.  [facility].   ? ?CSW spoke with pt daughter Sheila Mccoy, who confirms this choice, would like facility in [place] area.  Discussed process.  Either Sheila Mccoy or her sister will be here lator today to discuss any bed offers. ? ?Referral sent out in hub for SNF.  ? ?1550: Bed offers provided to pt and daughter Sheila Mccoy, they will review.               ? ? ?Expected Discharge Plan: [place] ?Barriers to Discharge: Continued Medical Work up, SNF Pending bed offer ? ? ?Patient Goals and CMS Choice ?Patient states their goals for this hospitalization and ongoing recovery are:: walk again ?CMS Medicare.gov Compare Post Acute Care list provided to:: Patient ?Choice offered to / list presented to : Patient, Adult Children ? ?Expected Discharge Plan and Services ?Expected Discharge Plan: [place] ?In-house Referral: Clinical Social Work ?  ?Post Acute Care Choice: [place] ?Living arrangements for the past 2 months: [facility]) ?                ?  ?  ?  ?  ?  ?  ?  ?  ?  ?  ? ?Prior Living Arrangements/Services ?Living arrangements for the past 2 months: [facility]) ?Lives with:: Facility Resident ?Patient language and need for interpreter reviewed:: Yes ?Do you feel safe going back to the place where you live?: Yes      ?Need for Family Participation in Patient Care: Yes (Comment) ?Care giver support system  in place?: Yes (comment) ?Current home services: Other (comment) (na) ?Criminal Activity/Legal Involvement Pertinent to Current Situation/Hospitalization: No - Comment as needed ? ?Activities of Daily Living ?Home Assistive Devices/Equipment: Gilford Rile (specify type) (4 wheel) ?ADL Screening (condition at time of admission) ?Patient's cognitive ability adequate to safely complete daily activities?: No ?Is the patient deaf or have difficulty hearing?: Yes ?Does the patient have difficulty seeing, even when wearing glasses/contacts?: No ?Does the patient have difficulty concentrating, remembering, or making decisions?: Yes ?Patient able to express need for assistance with ADLs?: Yes ?Does the patient have difficulty dressing or bathing?: Yes ?Independently performs ADLs?: No ?Communication: Independent ?Dressing (OT): Needs assistance ?Is this a change from baseline?: Change from baseline, expected to last >3 days ?Grooming: Needs assistance ?Is this a change from baseline?: Change from baseline, expected to last >3 days ?Feeding: Independent ?Bathing: Needs assistance ?Is this a change from baseline?: Change from baseline, expected to last >3 days ?Toileting: Needs assistance ?Is this a change from baseline?: Change from baseline, expected to last >3days ?In/Out Bed: Needs assistance ?Is this a change from baseline?: Change from baseline, expected to last >3 days ?Walks in Home: Needs assistance ?Is this a change from baseline?: Change from baseline, expected to last >3 days ?Does the patient have difficulty walking or climbing stairs?: Yes ?Weakness of Legs: Both ?Weakness of Arms/Hands: Both ? ?Permission Sought/Granted ?Permission sought  to share information with : Family Supports ?Permission granted to share information with : Yes, Verbal Permission Granted ? Share Information with NAME: both listed daughters ? Permission granted to share info w AGENCY: SNF ?   ?   ? ?Emotional Assessment ?Appearance:: Appears  stated age ?Attitude/Demeanor/Rapport: Engaged ?Affect (typically observed): Appropriate, Pleasant ?Orientation: : Oriented to Self, Oriented to Place, Oriented to  Time, Oriented to Situation ?Alcohol / Substance Use: Not Applicable ?Psych Involvement: No (comment) ? ?Admission diagnosis:  Hip fracture ([place]) [S72.009A] ?Left hip pain [M25.552] ?Fall, initial encounter [W19.XXXA] ?Patient Active Problem List  ? Diagnosis Date Noted  ? Closed left hip fracture ([place]) 01/27/2022  ? Hip fracture ([place]) 01/27/2022  ? Diarrhea in adult patient 12/01/2021  ? Metabolic alkalosis 0000000  ? Acute renal failure superimposed on stage 3a chronic kidney disease ([place]) 11/30/2021  ? Goals of care, counseling/discussion 11/30/2021  ? Acute and chronic respiratory failure with hypoxia ([place]) 11/29/2021  ? Hypotension 11/29/2021  ? Hyponatremia 11/24/2021  ? Acute on chronic respiratory failure with hypoxia ([place]) 11/23/2021  ? COPD with acute exacerbation ([place]) 11/23/2021  ? Glaucoma (increased eye pressure) 11/23/2021  ? DNR (do not resuscitate) 11/23/2021  ? Muscle weakness (generalized) 03/28/2021  ? Chronic respiratory failure with hypoxia ([place]) 01/12/2021  ? Chronic diastolic CHF (congestive heart failure) ([place]) 01/12/2021  ? GERD without esophagitis 01/12/2021  ? Traumatic closed fracture of one rib of right side with minimal displacement, initial encounter 01/11/2021  ? Memory difficulty 01/02/2021  ? Hypothyroidism 09/30/2020  ? History of TIA (transient ischemic attack) 09/29/2020  ? Anxiety and depression 07/12/2020  ? Acute lower UTI 07/09/2020  ? Adnexal cyst 07/09/2020  ? ?PCP:  Pcp, No ?Pharmacy:   ?[place], [place] [address] ?[place] [address] ?[address] ?[place] [address] ?Phone: [phone] Fax: [phone] ? ? ? ? ?Social Determinants of Health (SDOH) Interventions ?  ? ?Readmission Risk Interventions ? ?  12/03/2021  ?  1:28 PM  ?Readmission Risk  Prevention Plan  ?Transportation Screening Complete  ?PCP or Specialist Appt within 3-5 Days Complete  ?[place] or Home Care Consult Complete  ?Social Work Consult for [place] Planning/Counseling Complete  ?Palliative Care Screening Complete  ?Medication Review Press photographer) Complete  ? ? ? ?

## 2022-01-29 NOTE — Evaluation (Signed)
Occupational Therapy Evaluation ?Patient Details ?Name: Sheila Mccoy ?MRN: [id number] ?DOB: July 19, 1931 ?Today's Date: 01/29/2022 ? ? ?History of Present Illness 88 y.o. female presented with mechanical fall and left hip fracture. Pt is current with Hospice for her COPD, and per Orthopedic consult, no orthopedic sx and WBAT. PMH: advanced COPD with chronic hypoxic respite failure on home oxygen, severe anxiety on narcotics, hypothyroidism, CKD stage IIIa, chronic diastolic CHF, chronic ambulation dysfunction baseline with roller walker plus wheelchair,  ? ?Clinical Impression ?  ? Pt admitted as above, presenting with deficits as listed below (refer to OT problem list). Pt was seen for acute OT assessment today. Prior to this admission, she was set-up assist at her ALF for bathing and dressing. Meals and medication management was provided. She reports using a rollator, w/c and lift chair for mobility. Pt is with limited active R shoulder flexion secondary to an old injury. Pt was educated in role of OT following evaluation and then participated in OT ADL retraining session with focus on bed mobility, grooming, dressing (UB/LB), bathing UB, sitting at EOB. She declined OOB transfer to recliner and was Min A +1 for LLE with increased time to reposition and scoot up in bed. She is appropriate for acute OT to assist in maximizing independence with bed mobility and ADL's prior to anticipated d/c to SNF prior to ALF. ?   ? ?Recommendations for follow up therapy are one component of a multi-disciplinary discharge planning process, led by the attending physician.  Recommendations may be updated based on patient status, additional functional criteria and insurance authorization.  ? ?Follow Up Recommendations ? Skilled nursing-short term rehab (<3 hours/day)  ?  ?Assistance Recommended at Discharge Frequent or constant Supervision/Assistance  ?Patient can return home with the following A lot of help with walking and/or  transfers;A lot of help with bathing/dressing/bathroom;Assistance with cooking/housework;Direct supervision/assist for medications management;Assist for transportation ? ?  ?Functional Status Assessment ? Patient has had a recent decline in their functional status and demonstrates the ability to make significant improvements in function in a reasonable and predictable amount of time.  ?Equipment Recommendations ? Other (comment) (Defer to next venue)  ?  ?Recommendations for Other Services   ? ? ?  ?Precautions / Restrictions Precautions ?Precautions: Fall ?Restrictions ?Weight Bearing Restrictions: Yes ?LLE Weight Bearing: Weight bearing as tolerated  ? ?  ? ?Mobility Bed Mobility ?Overal bed mobility: Needs Assistance ?Bed Mobility: Supine to Sit, Sit to Supine ?  ?  ?Supine to sit: Mod assist, HOB elevated ?Sit to supine: Min assist, HOB elevated ?  ?General bed mobility comments: Pt was able to offweight hip by pushing R foot into bed to lift hips & ease L hip movement, able to scoot to [facility] w/ min A for L LE and increased time. HOB elevated to bring trunk to upright ?  ? ?Transfers ?Overall transfer level:  (Pt declined OOB transfer to chair today.) ?  ? ?  ?Balance Overall balance assessment: Needs assistance ?Sitting-balance support: Feet supported, Bilateral upper extremity supported, Single extremity supported ?Sitting balance-Leahy Scale: Fair ?Sitting balance - Comments: Pt required vc's to sit upright at EOB as she was noted to flex at her trunk in sitting initially. Sat at EOB for ADL's ?  ?Standing balance support:  (Pt declined OOB activity this morning) ?   ? ?ADL either performed or assessed with clinical judgement  ? ?ADL Overall ADL's : Needs assistance/impaired ?Eating/Feeding: Modified independent;Sitting ?  ?Grooming: Wash/dry hands;Wash/dry face;Oral care;Set up;Sitting ?Grooming  Details (indicate cue type and reason): Sitting up at EOB ?Upper Body Bathing: Set up;Sitting ?Upper Body Bathing  Details (indicate cue type and reason): Sitting EOB ?Lower Body Bathing: Minimal assistance;Total assistance;Sitting/lateral leans ?Lower Body Bathing Details (indicate cue type and reason): Min A R LE, total assist LLE secondary to hip fracture. ?Upper Body Dressing : Minimal assistance;Sitting ?Upper Body Dressing Details (indicate cue type and reason): Don/doff gown ?Lower Body Dressing: Total assistance;Minimal assistance;Sitting/lateral leans;Sit to/from stand;+2 for physical assistance;+2 for safety/equipment ?Lower Body Dressing Details (indicate cue type and reason): Min A don socks R LE and total assist LLE - pt would be +2 A for sit to stand anticipated ?Toilet Transfer: Moderate assistance;Maximal assistance;+2 for physical assistance;+2 for safety/equipment;Stand-pivot;Squat-pivot;BSC/3in1;Rolling walker (2 wheels) ?  ?Toileting- Clothing Manipulation and Hygiene: Maximal assistance;Bed level ?  ?Functional mobility during ADLs:  (Pt sat at EOB for grooming, bathing and dressing, however, she declined OOB activity at this time. Anticipate +2 assist to progress to mobility) ?General ADL Comments: Pt was seen for acute OT assessment today. Prior to this admission, she was set-up assist at her ALF for bathing and dressing. Meals and medication management was provided. Pt is with limited active R shoulder flexion secondary to an old injury. She is RHD. Pt participated in OT ADL retraining session with focus on bed mobility and grooming, dressing, bathing UB, sitting at EOB. She declined OOB transfer to recliner and was Min A +1 for LLE with increased time to reposition and scoot up in bed. She is appropriate for acute OT to assist in maximizing independence with bed mobility and ADL's prior to anticipated d/c to SNF prior to ALF.  ? ? ? ?Vision Baseline Vision/History: 1 Wears glasses (glasses for reading) ?Patient Visual Report: No change from baseline ?Vision Assessment?: No apparent visual deficits  ?   ?    ?   ? ?Pertinent Vitals/Pain Pain Assessment ?Pain Assessment: Faces ?Faces Pain Scale: Hurts little more ?Pain Location: L hip with movement/weightbearing ?Pain Descriptors / Indicators: Aching, Guarding, Sore ?Pain Intervention(s): Limited activity within patient's tolerance, Monitored during session, Repositioned, RN gave pain meds during session  ? ? ? ?Hand Dominance Right ?  ?Extremity/Trunk Assessment Upper Extremity Assessment ?Upper Extremity Assessment: RUE deficits/detail;Overall [facility] for tasks assessed ?RUE Deficits / Details: Significantly Limited active shoulder flexion to ~40*.  Pt reports old injury "A long time ago" when training for a 1/2 marathon and slipped on an acorn. Elbow, wrist and hand A/ROM is WFL's. ?RUE Sensation: WNL ?RUE Coordination: WNL ?  ?Lower Extremity Assessment ?Lower Extremity Assessment: Defer to PT evaluation ?  ?Cervical / Trunk Assessment ?Cervical / Trunk Assessment: Kyphotic ?  ?Communication Communication ?Communication: HOH ?  ?Cognition Arousal/Alertness: Awake/alert ?Behavior During Therapy: [facility] for tasks assessed/performed ?Overall Cognitive Status: Within Functional Limits for tasks assessed ?  ?General Comments: Pt was a + O x4 then asked "Am I still in the hospital" will cont to monitor cognition ?  ?  ?General Comments  Pt on 4L O2 via [place] on entry at 96%. When sitting up at OEB, pt benefit from vc's for breathing techniques, O2 assessed at 92-94% ? ?  ? ?Home Living Family/patient expects to be discharged to:: Assisted living ?  ?Home Equipment: Rollator (4 wheels);Wheelchair - manual ?  ?Additional Comments: [facility]; Pt reports using a lift chair at ALF ?  ? ?  ?Prior Functioning/Environment Prior Level of Function : Needs assist ?  ?  ?  ?Physical Assist : Mobility (  physical);ADLs (physical) ?Mobility (physical): Gait ?ADLs (physical): IADLs (set-up) ?Mobility Comments: ambulates in her room with RW and use WC to get to dining room for meals, either  self propelled or pushed ?ADLs Comments: reports independence with bathing and dressing with set up, facility provides for all iADLs including med management ?  ? ?  ?  ?OT Problem List: Decreased knowledge of use

## 2022-01-29 NOTE — Plan of Care (Signed)

## 2022-01-29 NOTE — [place] FL2 (Signed)
?[place] MEDICAID FL2 LEVEL OF CARE SCREENING TOOL  ?  ? ?IDENTIFICATION  ?Patient Name: ?Sheila Mccoy Birthdate: [birth year] Sex: female Admission Date (Current Location): ?01/27/2022  ?[place] and [place] Number: ? [place] ?  Facility and Address:  ?[facility] [place] [address] [place], [place] [address] ?     Provider Number: ?[id number]  ?Attending Physician Name and Address:  ?Barb Merino, MD ? Relative Name and Phone Number:  ?Arrie Aran Daughter   [phone] ?   ?Current Level of Care: ?Hospital Recommended Level of Care: ?[place] Prior Approval Number: ?  ? ?Date Approved/Denied: ?  PASRR Number: ?[id number] ? ?Discharge Plan: ?SNF ?  ? ?Current Diagnoses: ?Patient Active Problem List  ? Diagnosis Date Noted  ? Closed left hip fracture ([place]) 01/27/2022  ? Hip fracture ([place]) 01/27/2022  ? Diarrhea in adult patient 12/01/2021  ? Metabolic alkalosis 0000000  ? Acute renal failure superimposed on stage 3a chronic kidney disease ([place]) 11/30/2021  ? Goals of care, counseling/discussion 11/30/2021  ? Acute and chronic respiratory failure with hypoxia ([place]) 11/29/2021  ? Hypotension 11/29/2021  ? Hyponatremia 11/24/2021  ? Acute on chronic respiratory failure with hypoxia ([place]) 11/23/2021  ? COPD with acute exacerbation ([place]) 11/23/2021  ? Glaucoma (increased eye pressure) 11/23/2021  ? DNR (do not resuscitate) 11/23/2021  ? Muscle weakness (generalized) 03/28/2021  ? Chronic respiratory failure with hypoxia ([place]) 01/12/2021  ? Chronic diastolic CHF (congestive heart failure) ([place]) 01/12/2021  ? GERD without esophagitis 01/12/2021  ? Traumatic closed fracture of one rib of right side with minimal displacement, initial encounter 01/11/2021  ? Memory difficulty 01/02/2021  ? Hypothyroidism 09/30/2020  ? History of TIA (transient ischemic attack) 09/29/2020  ? Anxiety and depression 07/12/2020  ? Acute lower UTI 07/09/2020  ? Adnexal cyst 07/09/2020   ? ? ?Orientation RESPIRATION BLADDER Height & Weight   ?  ?Self, Time, Situation, Place ? O2 Incontinent Weight:   ?Height:     ?BEHAVIORAL SYMPTOMS/MOOD NEUROLOGICAL BOWEL NUTRITION STATUS  ?    Continent Diet (see discharge summary)  ?AMBULATORY STATUS COMMUNICATION OF NEEDS Skin   ?Total Care Verbally Normal, Other (Comment) (dry) ?  ?  ?  ?    ?     ?     ? ? ?Personal Care Assistance Level of Assistance  ?Bathing, Feeding, Dressing, Total care Bathing Assistance: Maximum assistance ?Feeding assistance: Independent ?Dressing Assistance: Maximum assistance ?Total Care Assistance: Maximum assistance  ? ?Functional Limitations Info  ?Sight, Hearing, Speech Sight Info: Adequate ?Hearing Info: Adequate ?Speech Info: Adequate  ? ? ?SPECIAL CARE FACTORS FREQUENCY  ?PT (By licensed PT), OT (By licensed OT)   ?  ?PT Frequency: 5x week ?OT Frequency: 5x week ?  ?  ?  ?   ? ? ?Contractures Contractures Info: Not present  ? ? ?Additional Factors Info  ?Code Status, Allergies Code Status Info: DNR ?Allergies Info: Atorvastatin, Nitrofurantoin, Rosuvastatin, Hydromorphone, Morphine And Related, Morphine, Tramadol ?  ?  ?  ?   ? ?Current Medications (01/29/2022):  This is the current hospital active medication list ?Current Facility-Administered Medications  ?Medication Dose Route Frequency Provider Last Rate Last Admin  ? acetaminophen (TYLENOL) tablet 1,000 mg  1,000 mg Oral Q6H PRN Wyvonnia Dusky, MD   1,000 mg at 01/28/22 2134  ? AeroChamber Plus Flo-Vu Large MISC 1 each  1 each Other Once Sherwood Gambler, MD      ? albuterol (PROVENTIL) (2.5 MG/3ML) 0.083% nebulizer solution  2.5 mg  2.5 mg Inhalation Q6H PRN Barb Merino, MD      ? bisacodyl (DULCOLAX) EC tablet 5 mg  5 mg Oral Daily PRN Wynetta Fines T, MD      ? buPROPion [facility]) tablet 75 mg  75 mg Oral Daily Wyvonnia Dusky, MD   75 mg at 01/29/22 [id number]  ? cholecalciferol (VITAMIN D3) tablet 1,000 Units  1,000 Units Oral BID Wyvonnia Dusky, MD   1,000  Units at 01/29/22 [id number]  ? clonazePAM (KLONOPIN) disintegrating tablet 0.25 mg  0.25 mg Oral BID Barb Merino, MD   0.25 mg at 01/29/22 [id number]  ? clopidogrel (PLAVIX) tablet 75 mg  75 mg Oral Daily Wyvonnia Dusky, MD   75 mg at 01/29/22 [id number]  ? dorzolamide-timolol (COSOPT) 22.3-6.8 MG/ML ophthalmic solution 1 drop  1 drop Both Eyes BID Wyvonnia Dusky, MD   1 drop at 01/29/22 [id number]  ? enoxaparin (LOVENOX) injection 30 mg  30 mg Subcutaneous Q24H Wynetta Fines T, MD   30 mg at 01/28/22 1915  ? famotidine (PEPCID) tablet 10 mg  10 mg Oral Q12H PRN Wyvonnia Dusky, MD      ? feeding supplement (ENSURE ENLIVE / ENSURE PLUS) liquid 237 mL  237 mL Oral BID BM Ghimire, Kuber, MD      ? fluticasone furoate-vilanterol (BREO ELLIPTA) 100-25 MCG/ACT 1 puff  1 puff Inhalation Daily Wyvonnia Dusky, MD   1 puff at 01/29/22 0800  ? furosemide (LASIX) tablet 20 mg  20 mg Oral Lauretta Chester, MD   20 mg at 01/28/22 [id number]  ? ipratropium-albuterol (DUONEB) 0.5-2.5 (3) MG/3ML nebulizer solution 3 mL  3 mL Inhalation BID Barb Merino, MD   3 mL at 01/29/22 0800  ? levothyroxine (SYNTHROID) tablet 68.5 mcg  68.5 mcg Oral QAC breakfast Wyvonnia Dusky, MD   68.5 mcg at 01/29/22 [id number]  ? loratadine (CLARITIN) tablet 10 mg  10 mg Oral Daily Wyvonnia Dusky, MD   10 mg at 01/29/22 [id number]  ? MEDLINE mouth rinse  15 mL Mouth Rinse BID Barb Merino, MD   15 mL at 01/29/22 [id number]  ? mirtazapine (REMERON) tablet 15 mg  15 mg Oral QHS Wyvonnia Dusky, MD   15 mg at 01/28/22 2134  ? multivitamin with minerals tablet 1 tablet  1 tablet Oral Daily Barb Merino, MD   1 tablet at 01/29/22 [id number]  ? oxyCODONE (Oxy IR/ROXICODONE) immediate release tablet 5 mg  5 mg Oral Q6H PRN Wyvonnia Dusky, MD   5 mg at 01/29/22 [id number]  ? oxyCODONE (Oxy IR/ROXICODONE) immediate release tablet 5-10 mg  5-10 mg Oral Q4H PRN Wynetta Fines T, MD      ? pantoprazole (PROTONIX) EC tablet 40 mg  40 mg Oral Daily Wyvonnia Dusky, MD   40 mg at 01/29/22  [id number]  ? QUEtiapine (SEROQUEL) tablet 25 mg  25 mg Oral QHS Wyvonnia Dusky, MD   25 mg at 01/28/22 2134  ? vitamin B-12 (CYANOCOBALAMIN) tablet 1,000 mcg  1,000 mcg Oral Daily Wyvonnia Dusky, MD   1,000 mcg at 01/29/22 [id number]  ? ? ? ?Discharge Medications: ?Please see discharge summary for a list of discharge medications. ? ?Relevant Imaging Results: ? ?Relevant Lab Results: ? ? ?Additional Information ?SSN: [id number] ? ?Joanne Chars, LCSW ? ? ? ? ?

## 2022-01-29 NOTE — Progress Notes (Addendum)
[id number] [facility]) Hospital Liaison Note ?  Arrie Aran [id number] contacted [facility] referral center reporting that she would like to speak with [facility] staff regarding [facility] hospice services. MSW contated [name] and had a lengthy conversation about GOC, D/C plans & hospice services. Patient is currently active with [facility] but is interested in pursuing alternative options. ? ?Family is currently pursuing SNF for patient to complete PT/OT and voiced interest in having a palliative referral made and PMT follow while she completes rehab and then be switched to Hospice. Family did not want to make an official referral today as they wanted to think everything over first and touch base with MSW/HLT tomorrow, 4.25.  ?  ?[facility] information and contact numbers given to family & above information shared with TOC/Gregory. ?  ?Please call with any questions/concerns.  ?  ?Thank you for the opportunity to participate in this patient's care. ?  ?Daphene Calamity, MSW ?[facility] Liaison  ?[phone] ? ?

## 2022-01-29 NOTE — Progress Notes (Addendum)
1945 Pt and daughter are complaining of pain and needing to have a bowel movement. On call MD alerted and meds given.  ? ?2300 Pt still complaining of pain and needing to have bowel movement. Daughter is on phone and given update. Education given about trying to give current meds and then moving to something else if current plan doesn't work. They are both understanding. On call MD is alerted. Please see orders.  ? ? ?0645 Pt still having an urge to have a bowel movement. Will text MD. ?

## 2022-01-30 DIAGNOSIS — S72002A Fracture of unspecified part of neck of left femur, initial encounter for closed fracture: Secondary | ICD-10-CM | POA: Diagnosis not present

## 2022-01-30 DIAGNOSIS — F32A Depression, unspecified: Secondary | ICD-10-CM

## 2022-01-30 DIAGNOSIS — F419 Anxiety disorder, unspecified: Secondary | ICD-10-CM

## 2022-01-30 LAB — URINALYSIS, ROUTINE W REFLEX MICROSCOPIC
Bilirubin Urine: NEGATIVE
Glucose, UA: NEGATIVE mg/dL
Hgb urine dipstick: NEGATIVE
Ketones, ur: NEGATIVE mg/dL
Leukocytes,Ua: NEGATIVE
Nitrite: NEGATIVE
Protein, ur: 100 mg/dL — AB
Specific Gravity, Urine: 1.027 (ref 1.005–1.030)
pH: 8 (ref 5.0–8.0)

## 2022-01-30 MED ORDER — BISACODYL 10 MG RE SUPP
10.0000 mg | Freq: Once | RECTAL | Status: DC
  Filled 2022-01-30: qty 1

## 2022-01-30 MED ORDER — LACTULOSE 10 GM/15ML PO SOLN
20.0000 g | Freq: Once | ORAL | Status: AC
  Administered 2022-01-30: 20 g via ORAL
  Filled 2022-01-30: qty 30

## 2022-01-30 NOTE — TOC Progression Note (Addendum)
Transition of Care (TOC) - Progression Note  ? ? ?Patient Details  ?Name: Sheila Mccoy ?MRN: [id number] ?Date of Birth: 10/03/1931 ? ?Transition of Care (TOC) CM/SW Contact  ?Lorri Frederick, LCSW ?Phone Number: ?01/30/2022, 10:20 AM ? ?Clinical Narrative:   CSW spoke with pt daughter [name] by phone.  She would like to accept bed offer at [facility].  [facility] informed. ? ?CSW spoke with [employer], they do manage this human[employer] policy for SNF auth.  Auth request submitted in [place]. ? ?CSW spoke with Cherie/[facility].  She has not done the revocation yet, but will do today.  ? ? ?Expected Discharge Plan: Skilled Nursing Facility ?Barriers to Discharge: Continued Medical Work up, SNF Pending bed offer ? ?Expected Discharge Plan and Services ?Expected Discharge Plan: Skilled Nursing Facility ?In-house Referral: Clinical Social Work ?  ?Post Acute Care Choice: Skilled Nursing Facility ?Living arrangements for the past 2 months: Assisted Living Facility [facility]) ?                ?  ?  ?  ?  ?  ?  ?  ?  ?  ?  ? ? ?Social Determinants of Health (SDOH) Interventions ?  ? ?Readmission Risk Interventions ? ?  12/03/2021  ?  1:28 PM  ?Readmission Risk Prevention Plan  ?Transportation Screening Complete  ?PCP or Specialist Appt within 3-5 Days Complete  ?HRI or Home Care Consult Complete  ?Social Work Consult for Recovery Care Planning/Counseling Complete  ?Palliative Care Screening Complete  ?Medication Review [facility]) Complete  ? ? ?

## 2022-01-30 NOTE — Progress Notes (Signed)
Occupational Therapy Treatment ?Patient Details ?Name: Sheila Mccoy ?MRN: [id number] ?DOB: 08-02-1931 ?Today's Date: 01/30/2022 ? ? ?History of present illness Pt is an 88 y.o. female admitted from ALF on 01/27/22 after fall sustaining L hip fx. Ofnote, pt is hospice patient with advanced COPD; per ortho, plan for non-operative management of L hip fx. PMH includes advanced COPD (on home O2), severe anxiety on narcotics, hypothyroidism, CKD stage IIIa, chronic diastolic CHF, chronic ambulation dysfunction. ?  ?OT comments ? Pt was seen for OT ADL retraining session today with focus on bed mobility, grooming, UB/LB bathing, transfer training and transfers from EOB to chair. Pt reports constipation, RN staff is aware. Pt was incontinent of bladder and bowel several times during this session but unable to have a complete bowel movement. Pt was on bedpan upon OT arrival and was unaware that purewick was removed or how long she was on bedpan. Pt was assisted with hygiene/care given +2 assist. She was up in chair at conclusion of this session. Discharge plan to SNF is appropriate.  ? ?Recommendations for follow up therapy are one component of a multi-disciplinary discharge planning process, led by the attending physician.  Recommendations may be updated based on patient status, additional functional criteria and insurance authorization. ?   ?Follow Up Recommendations ? Skilled nursing-short term rehab (<3 hours/day)  ?  ?Assistance Recommended at Discharge Frequent or constant Supervision/Assistance  ?Patient can return home with the following ? A lot of help with walking and/or transfers;A lot of help with bathing/dressing/bathroom;Assistance with cooking/housework;Direct supervision/assist for medications management;Assist for transportation ?  ?Equipment Recommendations ? Other (comment) (Defer to next venue)  ?  ?Recommendations for Other Services   ? ?  ?Precautions / Restrictions Precautions ?Precautions: Fall;Other  (comment) ?Precaution Comments: wears 2-4L O2 baseline ?Restrictions ?Weight Bearing Restrictions: Yes ?LLE Weight Bearing: Weight bearing as tolerated  ? ? ?  ? ?Mobility Bed Mobility ?Overal bed mobility: Needs Assistance ?Bed Mobility: Supine to Sit ?  ?  ?Supine to sit: Mod assist, HOB elevated ?  ?  ?General bed mobility comments: increased time and effort with cues for sequencing bed mobility, including BLE movement and use of bed rail, modA for scooting hips to EOB, minA for trunk elevation for elevated HOB. ?Patient Response: Flat affect, Cooperative ? ?Transfers ?Overall transfer level: Needs assistance ?Equipment used: Rolling walker (2 wheels) ?Transfers: Sit to/from Stand, Bed to chair/wheelchair/BSC ?Sit to Stand: Mod assist, From elevated surface ?Stand pivot transfers: Mod assist ?  ?  ?General transfer comment: performed 3x sit<>stands from elevated HOB, during hygiene/care after being incontinent of bowel/bladder several times. Pt then transfered to chair given mod A and maximal cuing verbal/tactile. ?  ?  ?Balance Overall balance assessment: Needs assistance ?Sitting-balance support: Feet supported, Bilateral upper extremity supported, Single extremity supported ?Sitting balance-Leahy Scale: Fair ?  ?  ?Standing balance support: Reliant on assistive device for balance, During functional activity, Bilateral upper extremity supported ?Standing balance-Leahy Scale: Poor ?Standing balance comment: reliant on BUE support and external assist for static standing and transfers ?   ? ?ADL either performed or assessed with clinical judgement  ? ?ADL Overall ADL's : Needs assistance/impaired ?  ?  ?Grooming: Set up;Sitting ?Grooming Details (indicate cue type and reason): Sitting up at EOB ?Upper Body Bathing: Set up;Sitting ?Upper Body Bathing Details (indicate cue type and reason): Sitting EOB ?Lower Body Bathing: Moderate assistance;+2 for safety/equipment;+2 for physical assistance ?Lower Body Bathing  Details (indicate cue type and reason): Min A bathing  after having been on bedpan and w/o purewick - pt had soiled bed. Mod A sit to stand and second person to assist in hygiene/cleaning up. Pt is constipated and uncomfortable, incontinent of urine/bowels ?Upper Body Dressing : Minimal assistance;Sitting ?  ?Lower Body Dressing: Sitting/lateral leans;Sit to/from stand;Maximal assistance;Moderate assistance ?  ?Toilet Transfer: Moderate assistance;+2 for physical assistance;+2 for safety/equipment;BSC/3in1;Rolling walker (2 wheels) ?Toilet Transfer Details (indicate cue type and reason): Simulated: Increased time for transfer from EOB to chair using RW, verbal and tactile cues in preparation for toileting. Pt was incontinent of bowel/bladder several times during this session ?Toileting- Clothing Manipulation and Hygiene: Moderate assistance;+2 for physical assistance;+2 for safety/equipment;Sit to/from stand;Sitting/lateral lean ?Toileting - Clothing Manipulation Details (indicate cue type and reason): See above ?  ?General ADL Comments: Pt was seen for OT ADL retraining session today with focus on bed mobility, grooming, UB/LB bathing, transfer training and transfers from EOB to chair. Pt reports constipation, RN staff is aware. Pt was incontinent of bladder and bowel several times during this session but unable to have a complete bowel movement. Pt was on bedpan upon OT arrival and was unaware that purewick was removed or how long she was on bedpan. Pt was assisted with hygiene/care given +2 assist. She was up in chair at conclusion of this session. ?  ? ?Extremity/Trunk Assessment Upper Extremity Assessment ?Upper Extremity Assessment: Overall WFL for tasks assessed;RUE deficits/detail ?RUE Deficits / Details: Significantly Limited active shoulder flexion to ~40*.  Pt reports old injury "A long time ago" when training for a 1/2 marathon and slipped on an acorn. Elbow, wrist and hand A/ROM is WFL's. ?RUE Sensation:  WNL ?RUE Coordination: WNL ?  ?Lower Extremity Assessment ?Lower Extremity Assessment: Defer to PT evaluation ?  ?Cervical / Trunk Assessment ?Cervical / Trunk Assessment: Kyphotic ?  ? ?Vision Baseline Vision/History: 1 Wears glasses (reading) ?Patient Visual Report: No change from baseline ?Vision Assessment?: No apparent visual deficits ?  ?   ?   ? ?Cognition Arousal/Alertness: Awake/alert ?Behavior During Therapy: [facility] for tasks assessed/performed, Flat affect ?Overall Cognitive Status: No family/caregiver present to determine baseline cognitive functioning ?  ?General Comments: following majority of simple commands and seemingly answering questions appropriately; decreased problem solving, increased time for processing ?  ?  ?   ?   ?   ?General Comments SpO2 94% on 4L O2 this session  ? ? ?Pertinent Vitals/ Pain       Pain Assessment ?Pain Assessment: Faces ?Faces Pain Scale: Hurts little more ?Pain Location: L hip with movement/weightbearing ?Pain Descriptors / Indicators: Aching, Guarding, Sore, Moaning ?Pain Intervention(s): Limited activity within patient's tolerance, Monitored during session, Repositioned, Other (comment) (Pt reporting need to have BM, RN staff is aware) ? ?Home Living Family/patient expects to be discharged to:: Assisted living ?  ?  ?Home Equipment: Rollator (4 wheels);Wheelchair - manual ?  ?Additional Comments: [facility]; Pt reports using a lift chair at ALF ?  ? ?  ?Prior Functioning/Environment   Pt lives at [facility]. Set-up assist for ADL's (UB, gets assistance for baths), uses RW and w/c in room. Mod I toileting/transfers. Please refer to OT Eval for details of PLOF ?   ? ?Frequency ? Min 2X/week  ? ? ? ? ?  ?Progress Toward Goals ? ?OT Goals(current goals can now be found in the care plan section) ? Progress towards OT goals: Progressing toward goals ? ?Acute Rehab OT Goals ?Patient Stated Goal: Go to Rehab. Have a complete bowel movement ?OT  Goal Formulation: With patient ?Time  For Goal Achievement: 02/12/22 ?Potential to Achieve Goals: Fair  ?Plan Discharge plan remains appropriate   ? ?   ?AM-PAC OT "6 Clicks" Daily Activity     ?Outcome Measure ? ? Help from another person eatin

## 2022-01-30 NOTE — Progress Notes (Signed)
? ?  Notified by Michail Sermon that the family has chosen a facility for pt to d/c to and pursue aggressive rehabilitation. I have spoke to the daughter [name] and she has paperwork to sign electing to revoke the hospice medicare benefit for her mother so that they can pursue this.  ? ?Norm Parcel RN [phone]  ?

## 2022-01-30 NOTE — Progress Notes (Signed)
?  Mobility Specialist Criteria Algorithm Info. ? ? ? 01/30/22 1525  ?Mobility  ?Activity Transferred to/from [facility]  ?Range of Motion/Exercises Passive;Left leg  ?Level of Assistance +2 (takes two people)  ?Assistive Device Front wheel walker;BSC  ?LLE Weight Bearing WBAT  ?Activity Response Tolerated fair  ? ?Patient received in chair requesting assistance to [facility] then to bed. Required 2+ mod assist to stand and pivot to and from [facility] and then bed. Also needed cues for hand placement on RW. Tolerated without incident and was left with all needs met. RN and NT present.  ? ?01/30/2022 ?3:25 PM ? ?Martinique Cottrell Gentles, CMS, BS EXP ?Acute Rehabilitation Services  ?QWBEQ:[phone] ?Office: [phone] ? ?

## 2022-01-30 NOTE — Care Management Important Message (Signed)
Important Message ? ?Patient Details  ?Name: Sheila Mccoy ?MRN: [id number] ?Date of Birth: 21-Jan-1931 ? ? ?Medicare Important Message Given:  Yes ? ? ? ? ?Sherylann Vangorden ?01/30/2022, 2:00 PM ?

## 2022-01-30 NOTE — Progress Notes (Signed)
?PROGRESS NOTE ? ? ? ?Sheila Mccoy  [id number] DOB: 12/09/1930 DOA: 01/27/2022 ?PCP: Pcp, No  ? ? ?Brief Narrative:  ?88 year old with history of advanced COPD and chronic hypoxemic respiratory failure on 4 L oxygen at home, severe anxiety on narcotics, hypothyroidism, CKD stage IIIa, chronic diastolic dysfunction, chronic ambulatory dysfunction from assisted living facility presented with mechanical fall and left hip fracture.  She is also with hospice follow-up at assisted living facility.  Brought to the emergency room and she was found to have minimally displaced left trochanteric fracture.  She was evaluated by orthopedics, recommended pain management and therapies. ? ? ?Assessment & Plan: ?  ?Close traumatic left greater trochanteric fracture: ?Seen by orthopedics, nonoperative management advised because of patient's poor mobility and advanced COPD. ?Pain medications, Tylenol and oxycodone.  Avoid morphine or Dilaudid.  Continue laxatives. ?Mobilize with PT OT, weightbearing as tolerated as per orthopedics. ?She needs to go to short-term rehab before transitioning back to assisted living facility. ?DVT prophylaxis with Lovenox. ? ?Chronic hypoxemic respiratory failure due to COPD: ?Patient on a scheduled treatment, optimized on COPD treatment along with 4 L of oxygen at rest.  Currently stable.  Continue bronchodilator therapy, steroid inhalers.  Keep on oxygen to keep saturations more than 89%.  Patient is on Lasix and euvolemic. ? ?Hypothyroidism: On Synthroid. ? ?Chronic medical management with hospice care: Patient needing a skilled level of care.  Referred to short-term rehab and ultimate reengaged with hospice once she is able to go back to assisted living facility or long-term care. ? ?Constipation: Aggressive bowel regimen.  Additional dose of lactulose today.  Patient concerned about UTI, will check UA. ? ? ?DVT prophylaxis: enoxaparin (LOVENOX) injection 30 mg Start: 01/27/22 1700 ? ? ?Code Status:  DNR ?Family Communication: None today. ?Disposition Plan: Status is: Inpatient ?Remains inpatient appropriate because: Unsafe discharge plan, patient is medically stable to transfer to skilled level of care when bed is available. ?  ? ? ?Consultants:  ?Orthopedics ? ?Procedures:  ?None ? ?Antimicrobials:  ?None ? ? ?Subjective: ? ?Seen and examined.  She complains of constipation.  She had 1 bowel movement overnight, however she feels like she is still has a lot of stool to come out. ?Pain is controlled except when she is moving. ? ?Objective: ?Vitals:  ? 01/29/22 1122 01/29/22 1945 01/30/22 [id number] 01/30/22 0941  ?BP: 108/63 (!) 130/96  (!) 123/59  ?Pulse: 68 72  61  ?Resp: 16 18  18   ?Temp: 97.6 ?F (36.4 ?C) 98.3 ?F (36.8 ?C)  98 ?F (36.7 ?C)  ?TempSrc: Oral Oral  Oral  ?SpO2:  92% 94% 97%  ? ? ?Intake/Output Summary (Last 24 hours) at 01/30/2022 1101 ?Last data filed at 01/29/2022 2020 ?Gross per 24 hour  ?Intake 240 ml  ?Output --  ?Net 240 ml  ? ?There were no vitals filed for this visit. ? ?Examination: ? ?General: Looks fairly comfortable.  Frail and debilitated. ?Today sitting in the couch.  Looks comfortable however chronically sick looking. ?Cardiovascular: S1-S2 normal.  Regular rate rhythm. ?Respiratory: Bilateral clear.  Currently on 4 L oxygen. ?Gastrointestinal: Soft.  Nontender.  Bowel sound present. ?Ext: No edema or swelling.  Tenderness left hip.  No deformity. ?Neuro: Alert oriented x4.  Intact.  Flat affect.  Anxious. ? ? ? ? ? ?Data Reviewed: I have personally reviewed following labs and imaging studies ? ?CBC: ?Recent Labs  ?Lab 01/27/22 ?1455  ?WBC 15.4*  ?HGB 13.6  ?HCT 42.8  ?MCV 90.7  ?PLT  389  ? ?Basic Metabolic Panel: ?Recent Labs  ?Lab 01/27/22 ?1455  ?NA 135  ?K 4.2  ?CL 97*  ?CO2 26  ?GLUCOSE 120*  ?BUN 21  ?CREATININE 0.89  ?CALCIUM 9.6  ? ?GFR: ?CrCl cannot be calculated (Unknown ideal weight.). ?Liver Function Tests: ?No results for input(s): AST, ALT, ALKPHOS, BILITOT, PROT, ALBUMIN in  the last 168 hours. ?No results for input(s): LIPASE, AMYLASE in the last 168 hours. ?No results for input(s): AMMONIA in the last 168 hours. ?Coagulation Profile: ?No results for input(s): INR, PROTIME in the last 168 hours. ?Cardiac Enzymes: ?No results for input(s): CKTOTAL, CKMB, CKMBINDEX, TROPONINI in the last 168 hours. ?BNP (last 3 results) ?No results for input(s): PROBNP in the last 8760 hours. ?HbA1C: ?No results for input(s): HGBA1C in the last 72 hours. ?CBG: ?No results for input(s): GLUCAP in the last 168 hours. ?Lipid Profile: ?No results for input(s): CHOL, HDL, LDLCALC, TRIG, CHOLHDL, LDLDIRECT in the last 72 hours. ?Thyroid Function Tests: ?No results for input(s): TSH, T4TOTAL, FREET4, T3FREE, THYROIDAB in the last 72 hours. ?Anemia Panel: ?No results for input(s): VITAMINB12, FOLATE, FERRITIN, TIBC, IRON, RETICCTPCT in the last 72 hours. ?Sepsis Labs: ?No results for input(s): PROCALCITON, LATICACIDVEN in the last 168 hours. ? ?No results found for this or any previous visit (from the past 240 hour(s)).  ? ? ? ? ? ?Radiology Studies: ?No results found. ? ? ? ? ? ?Scheduled Meds: ? AeroChamber Plus Flo-Vu Large  1 each Other Once  ? bisacodyl  10 mg Rectal Once  ? buPROPion  75 mg Oral Daily  ? cholecalciferol  1,000 Units Oral BID  ? clonazePAM  0.25 mg Oral BID  ? clopidogrel  75 mg Oral Daily  ? dorzolamide-timolol  1 drop Both Eyes BID  ? enoxaparin (LOVENOX) injection  30 mg Subcutaneous Q24H  ? feeding supplement  237 mL Oral BID BM  ? fluticasone furoate-vilanterol  1 puff Inhalation Daily  ? furosemide  20 mg Oral QODAY  ? ipratropium-albuterol  3 mL Inhalation BID  ? levothyroxine  68.5 mcg Oral QAC breakfast  ? loratadine  10 mg Oral Daily  ? mouth rinse  15 mL Mouth Rinse BID  ? mirtazapine  15 mg Oral QHS  ? multivitamin with minerals  1 tablet Oral Daily  ? pantoprazole  40 mg Oral Daily  ? QUEtiapine  25 mg Oral QHS  ? cyanocobalamin  1,000 mcg Oral Daily  ? ?Continuous  Infusions: ? ? LOS: 3 days  ? ? ?Time spent: 35 minutes ? ? ? ?Sheila Merino, MD ?[facility] ?Pager [phone] ? ?

## 2022-01-31 DIAGNOSIS — F419 Anxiety disorder, unspecified: Secondary | ICD-10-CM | POA: Diagnosis not present

## 2022-01-31 DIAGNOSIS — F32A Depression, unspecified: Secondary | ICD-10-CM | POA: Diagnosis not present

## 2022-01-31 DIAGNOSIS — S72002A Fracture of unspecified part of neck of left femur, initial encounter for closed fracture: Secondary | ICD-10-CM | POA: Diagnosis not present

## 2022-01-31 MED ORDER — ENOXAPARIN SODIUM 30 MG/0.3ML IJ SOSY: 30.0000 mg | PREFILLED_SYRINGE | INTRAMUSCULAR | Status: AC

## 2022-01-31 NOTE — Progress Notes (Signed)
Pt's IV got infiltrated and was removed. Per MD not to place another one since pt is going to SNF today. ?

## 2022-01-31 NOTE — Plan of Care (Signed)

## 2022-01-31 NOTE — Discharge Summary (Signed)
Physician Discharge Summary  ?Sheila Mccoy [id number] DOB: 01/14/1931 DOA: 01/27/2022 ? ?PCP: Pcp, No ? ?Admit date: 01/27/2022 ?Discharge date: 01/31/2022 ? ?Admitted From: Assisted living facility ?Disposition: Skilled nursing facility ? ?Recommendations for Outpatient Follow-up:  ?May need long-term placement. ?Consult palliative care at a skilled nursing facility to continue care. ? ?Home Health: N/A ?Equipment/Devices: N/A ? ?Discharge Condition: Stable ?CODE STATUS: DNR ?Diet recommendation: Regular diet, nutritional supplements ? ?Discharge summary: ?88 year old with history of advanced COPD and chronic hypoxemic respiratory failure on 4 L oxygen at home, severe anxiety on narcotics, hypothyroidism, CKD stage IIIa, chronic diastolic dysfunction, chronic ambulatory dysfunction from assisted living facility presented with mechanical fall and left hip fracture.  She is also with hospice follow-up at assisted living facility.  Brought to the emergency room and she was found to have minimally displaced left trochanteric fracture.  She was evaluated by orthopedics, recommended pain management and therapies. ?  ?  ?Assessment & plan of care: ? ?Close traumatic left greater trochanteric fracture: ?Seen by orthopedics, nonoperative management advised because of patient's poor mobility and advanced COPD. ?Pain medications, Tylenol and oxycodone.  Avoid morphine or Dilaudid.  Continue laxatives. ?Mobilize with PT OT, weightbearing as tolerated as per orthopedics. ?She needs to go to short-term rehab before transitioning back to assisted living facility. ?DVT prophylaxis with Lovenox for 3 weeks.  SCDs. ?  ?Chronic hypoxemic respiratory failure due to COPD: ?Patient on a scheduled treatment, optimized on COPD treatment along with 4 L of oxygen at rest.  Currently stable.  Continue bronchodilator therapy, steroid inhalers.  Keep on oxygen to keep saturations more than 89%.  Patient is on Lasix and euvolemic. ?   ?Hypothyroidism: On Synthroid. ?  ?Chronic medical management with hospice care: Patient needing a skilled level of care.  Referred to short-term rehab and ultimate reengage with hospice once she is able to go back to assisted living facility or long-term care. ?Consult palliative care to continue follow-up at the skilled nursing rehab. ?  ?Medically stable to transfer to skilled level of care today. ? ?Discharge Diagnoses:  ?Principal Problem: ?  Hip fracture ([place]) ?Active Problems: ?  Chronic diastolic CHF (congestive heart failure) ([place]) ?  Anxiety and depression ?  COPD with acute exacerbation ([place]) ?  Acute and chronic respiratory failure with hypoxia ([facility]) ?  Closed left hip fracture ([place]) ? ? ? ?Discharge Instructions ? ? ?Allergies as of 01/31/2022   ? ?   Reactions  ? Atorvastatin Other (See Comments)  ? Muscle paralysis and unable to use the legs- "Allergic," per [facility]  ? Nitrofurantoin Other (See Comments)  ? PT reports possible allergy -- weakness, tiredness when taking- "Allergic," per [facility]  ? Rosuvastatin Other (See Comments)  ? Lower extremity weakness, gait instability and confusion- "Allergic," per [facility]  ? Hydromorphone Other (See Comments)  ? Very Disoriented & Confused- "Allergic," per [facility]  ? Morphine And Related Other (See Comments)  ? Catatonic- "Allergic," per [facility]  ? Morphine Other (See Comments)  ? Confusion- "Allergic," per [facility]  ? Tramadol Other (See Comments)  ? Causes confusion- "Allergic," per [facility]  ? ?  ? ?  ?Medication List  ?  ? ?STOP taking these medications   ? ?albuterol (2.5 MG/3ML) 0.083% nebulizer solution ?Commonly known as: PROVENTIL ?  ?albuterol 108 (90 Base) MCG/ACT inhaler ?Commonly known as: VENTOLIN HFA ?  ?cyclobenzaprine 10 MG tablet ?Commonly known as: FLEXERIL ?  ?LORazepam 0.5 MG tablet ?Commonly known as: ATIVAN ?  ?predniSONE 10  MG tablet ?Commonly known as: DELTASONE ?  ? ?  ? ?TAKE these medications   ? ?acetaminophen 500 MG tablet ?Commonly known as: TYLENOL ?Take  1,000 mg by mouth every 6 (six) hours as needed for mild pain. ?  ?bisacodyl 5 MG EC tablet ?Commonly known as: DULCOLAX ?Take 1 tablet (5 mg total) by mouth daily as needed for moderate constipation. ?  ?Breo Ellipta 100-25 MCG/ACT Aepb ?Generic drug: fluticasone furoate-vilanterol ?Inhale 1 puff into the lungs daily. ?  ?buPROPion 75 MG tablet ?Commonly known as: WELLBUTRIN ?Take 75 mg by mouth daily. ?  ?clonazePAM 0.25 MG disintegrating tablet ?Commonly known as: KLONOPIN ?Take 1 tablet (0.25 mg total) by mouth 2 (two) times daily for 5 days. ?What changed: when to take this ?  ?clopidogrel 75 MG tablet ?Commonly known as: PLAVIX ?Take 1 tablet (75 mg total) by mouth daily. ?  ?Combivent Respimat 20-100 MCG/ACT Aers respimat ?Generic drug: Ipratropium-Albuterol ?Inhale 2 puffs into the lungs See admin instructions. Inhale 2 puffs into the lungs four times a day and every 4 hours as needed for shortness of breath ?  ?Cosopt 22.3-6.8 MG/ML ophthalmic solution ?Generic drug: dorzolamide-timolol ?Place 1 drop into the left eye 2 (two) times daily. ?  ?cyanocobalamin 1000 MCG tablet ?Take 1,000 mcg by mouth daily. ?  ?diclofenac Sodium 1 % Gel ?Commonly known as: VOLTAREN ?Apply 2 g topically every 6 (six) hours as needed (for pain). ?  ?enoxaparin 30 MG/0.3ML injection ?Commonly known as: LOVENOX ?Inject 0.3 mLs (30 mg total) into the skin daily for 21 days. ?  ?famotidine 10 MG tablet ?Commonly known as: PEPCID ?Take 10 mg by mouth every 12 (twelve) hours as needed for heartburn. ?  ?fluticasone 50 MCG/ACT nasal spray ?Commonly known as: FLONASE ?Place 1 spray into both nostrils daily. ?What changed:  ?how much to take ?when to take this ?reasons to take this ?  ?furosemide 20 MG tablet ?Commonly known as: LASIX ?Take 20 mg by mouth every other day. ?  ?guaiFENesin 600 MG 12 hr tablet ?Commonly known as: Mucinex ?Take 1 tablet (600 mg total) by mouth 2 (two) times daily. ?What changed: when to take this ?   ?latanoprost 0.005 % ophthalmic solution ?Commonly known as: XALATAN ?Place 1 drop into both eyes at bedtime. ?  ?LIDOPURE PATCH EX ?Apply 1 patch topically See admin instructions. Apply 1 patch to the left shoulder every day as needed for pain- on 12 hours/off 12 hours ?  ?loperamide 2 MG tablet ?Commonly known as: IMODIUM A-D ?Take 2 mg by mouth every 8 (eight) hours as needed (for diarrhea). ?  ?loratadine 10 MG tablet ?Commonly known as: CLARITIN ?Take 1 tablet (10 mg total) by mouth daily. ?  ?magnesium oxide 400 MG tablet ?Commonly known as: MAG-OX ?Take 400 mg by mouth daily. ?  ?mirtazapine 15 MG tablet ?Commonly known as: REMERON ?Take 1 tablet (15 mg total) by mouth at bedtime. ?  ?ondansetron 4 MG tablet ?Commonly known as: ZOFRAN ?Take 4 mg by mouth every 6 (six) hours as needed for nausea or vomiting. ?  ?oxyCODONE 5 MG immediate release tablet ?Commonly known as: Oxy IR/ROXICODONE ?Take 1 tablet (5 mg total) by mouth 4 (four) times daily for 5 days. ?What changed: Another medication with the same name was removed. Continue taking this medication, and follow the directions you see here. ?  ?OXYGEN ?Inhale 4 L/min into the lungs See admin instructions. Inhale 4 L/min of oxygen into the lungs "every shift" ?  ?  pantoprazole 40 MG tablet ?Commonly known as: PROTONIX ?Take 40 mg by mouth in the morning. ?  ?polyethylene glycol 17 g packet ?Commonly known as: MIRALAX / GLYCOLAX ?Take 17 g by mouth 2 (two) times daily as needed for mild constipation. ?What changed:  ?when to take this ?additional instructions ?  ?prochlorperazine 10 MG tablet ?Commonly known as: COMPAZINE ?Take 10 mg by mouth every 4 (four) hours as needed for nausea or vomiting. ?  ?QUEtiapine 25 MG tablet ?Commonly known as: SEROquel ?Take 1 tablet (25 mg total) by mouth at bedtime. ?  ?Rhopressa 0.02 % Soln ?Generic drug: Netarsudil Dimesylate ?Place 1 drop into both eyes at bedtime. ?  ?sennosides-docusate sodium 8.6-50 MG  tablet ?Commonly known as: SENOKOT-S ?Take 2 tablets by mouth daily as needed for constipation. ?  ?Synthroid 137 MCG tablet ?Generic drug: levothyroxine ?Take 68.5 mcg by mouth in the morning. ?  ?Systane 0.4-0.3 % Soln ?Generic drug: Pol

## 2022-01-31 NOTE — TOC Transition Note (Signed)
Transition of Care (TOC) - CM/SW Discharge Note ? ? ?Patient Details  ?Name: Sheila Mccoy ?MRN: [id number] ?Date of Birth: 06-06-1931 ? ?Transition of Care [facility]) CM/SW Contact:  ?Lorri Frederick, LCSW ?Phone Number: ?01/31/2022, 11:50 AM ? ? ?Clinical Narrative:   Pt discharging to [facility], room 303. RN call report to [phone]. ? ? ? ?  ?Barriers to Discharge: Barriers Resolved ? ? ?Patient Goals and CMS Choice ?Patient states their goals for this hospitalization and ongoing recovery are:: walk again ?CMS Medicare.gov Compare Post Acute Care list provided to:: Patient ?Choice offered to / list presented to : Patient, Adult Children ? ?Discharge Placement ?  ?           ?Patient chooses bed at: [facility] ?Patient to be transferred to facility by: [facility] ?Name of family member notified: daughter [name] ?Patient and family notified of of transfer: 01/31/22 ? ?Discharge Plan and Services ?In-house Referral: Clinical Social Work ?  ?Post Acute Care Choice: Skilled Nursing Facility          ?  ?  ?  ?  ?  ?  ?  ?  ?  ?  ? ?Social Determinants of Health (SDOH) Interventions ?  ? ? ?Readmission Risk Interventions ? ?  12/03/2021  ?  1:28 PM  ?Readmission Risk Prevention Plan  ?Transportation Screening Complete  ?PCP or Specialist Appt within 3-5 Days Complete  ?HRI or Home Care Consult Complete  ?Social Work Consult for Recovery Care Planning/Counseling Complete  ?Palliative Care Screening Complete  ?Medication Review [facility]) Complete  ? ? ? ? ? ?

## 2022-01-31 NOTE — Plan of Care (Signed)

## 2022-01-31 NOTE — TOC Progression Note (Signed)
Transition of Care (TOC) - Progression Note  ? ? ?Patient Details  ?Name: Mekhi Lascola ?MRN: [id number] ?Date of Birth: Jun 23, 1931 ? ?Transition of Care (TOC) CM/SW Contact  ?Lorri Frederick, LCSW ?Phone Number: ?01/31/2022, 8:21 AM ? ?Clinical Narrative:   Auth approved in [place]: [id number], [id number], 3 days: 4/26-4/28. ? ? ? ?Expected Discharge Plan: Skilled Nursing Facility ?Barriers to Discharge: Continued Medical Work up, SNF Pending bed offer ? ?Expected Discharge Plan and Services ?Expected Discharge Plan: Skilled Nursing Facility ?In-house Referral: Clinical Social Work ?  ?Post Acute Care Choice: Skilled Nursing Facility ?Living arrangements for the past 2 months: Assisted Living Facility [facility]) ?                ?  ?  ?  ?  ?  ?  ?  ?  ?  ?  ? ? ?Social Determinants of Health (SDOH) Interventions ?  ? ?Readmission Risk Interventions ? ?  12/03/2021  ?  1:28 PM  ?Readmission Risk Prevention Plan  ?Transportation Screening Complete  ?PCP or Specialist Appt within 3-5 Days Complete  ?HRI or Home Care Consult Complete  ?Social Work Consult for Recovery Care Planning/Counseling Complete  ?Palliative Care Screening Complete  ?Medication Review Oceanographer) Complete  ? ? ?

## 2022-01-31 NOTE — Progress Notes (Signed)
[facility] rehab was called to give a report. RN is not available to take report at this time. The phone number is given for a call back.   ?

## 2022-02-01 ENCOUNTER — Non-Acute Institutional Stay (SKILLED_NURSING_FACILITY): Payer: Medicare HMO | Admitting: Adult Health

## 2022-02-01 ENCOUNTER — Encounter: Payer: Self-pay | Admitting: Adult Health

## 2022-02-01 DIAGNOSIS — K219 Gastro-esophageal reflux disease without esophagitis: Secondary | ICD-10-CM

## 2022-02-01 DIAGNOSIS — G459 Transient cerebral ischemic attack, unspecified: Secondary | ICD-10-CM

## 2022-02-01 DIAGNOSIS — S72002S Fracture of unspecified part of neck of left femur, sequela: Secondary | ICD-10-CM

## 2022-02-01 DIAGNOSIS — J449 Chronic obstructive pulmonary disease, unspecified: Secondary | ICD-10-CM

## 2022-02-01 DIAGNOSIS — J9611 Chronic respiratory failure with hypoxia: Secondary | ICD-10-CM | POA: Diagnosis not present

## 2022-02-01 DIAGNOSIS — F319 Bipolar disorder, unspecified: Secondary | ICD-10-CM | POA: Diagnosis not present

## 2022-02-01 MED ORDER — OXYCODONE HCL 5 MG PO TABS: 5.0000 mg | ORAL_TABLET | Freq: Four times a day (QID) | ORAL | 0 refills | Status: AC | PRN

## 2022-02-01 NOTE — Progress Notes (Signed)
? ?Location:  [facility] ?Nursing Home Room Number: 303-A ?Place of Service:  SNF (31) ?Provider:  Durenda Age, DNP, FNP-BC ? ?Patient Care Team: ?Pcp, No as PCP - General ?Frann Rider, NP as Nurse Practitioner (Neurology) ?Orvan July, MD as Referring Physician (Pulmonary Disease) ?Milton Ferguson, DO as Referring Physician (Family Medicine) ? ?Extended Emergency Contact Information ?Primary Emergency Contact: Zettie Pho, [name] ?Mobile Phone: [phone] ?Relation: Daughter ?Secondary Emergency Contact: SOURDIFFE,FERRILYN ?Home Phone: [phone] ?Relation: Daughter ? ?Code Status:  DNR ? ?Goals of care: Advanced Directive information ? ?  02/01/2022  ?  9:56 AM  ?Advanced Directives  ?Does Patient Have a Medical Advance Directive? Yes  ?Type of Advance Directive Out of facility DNR (pink MOST or yellow form)  ?Does patient want to make changes to medical advance directive? No - Patient declined  ?Pre-existing out of facility DNR order (yellow form or pink MOST form) Yellow form placed in chart (order not valid for inpatient use)  ? ? ? ?Chief Complaint  ?Patient presents with  ? Hospitalization Follow-up  ? ? ?HPI:  ?Pt is a 88 y.o. female who was admitted to [facility] on 01/31/2022 post hospital admission 01/27/2022 to 01/31/2022.  She has a PMH of advanced COPD and chronic hypoxemic respiratory failure on 4 L oxygen at home, severe anxiety on narcotics, hypothyroidism, chronic kidney disease stage IIIA, chronic diastolic dysfunction and chronic ambulatory dysfunction.  She lives in an assisted facility where she had a mechanical fall for which she sustained a left hip fracture.  She is also followed by hospice at her assisted living facility.  She was found to have minimally displaced left trochanteric fracture.  Orthopedics was consulted and recommended pain management and therapies. ? ?She was admitted to [facility] for short-term rehabilitation. ? ? ?Past  Medical History:  ?Diagnosis Date  ? Acute metabolic encephalopathy [id number]  ? Adnexal cyst 07/09/2020  ? Anxiety   ? Artificial pacemaker   ? Bipolar disorder ([place])   ? COPD (chronic obstructive pulmonary disease) ([place])   ? Depression   ? History of CT scan 2021  ? Legs.  ? Hypothyroidism 09/30/2020  ? Memory difficulty 01/02/2021  ? Sacral fracture ([place]) 07/09/2020  ? TIA (transient ischemic attack) 09/29/2020  ? UTI (urinary tract infection) 07/09/2020  ? ?Past Surgical History:  ?Procedure Laterality Date  ? arm fracture surgery    ? MRI  2021  ? Lungs.  ? right shoulder     ? pins in right shoulder.  ? ? ?Allergies  ?Allergen Reactions  ? Atorvastatin Other (See Comments)  ?  Muscle paralysis and unable to use the legs- "Allergic," per [facility]  ? Nitrofurantoin Other (See Comments)  ?  PT reports possible allergy -- weakness, tiredness when taking- "Allergic," per [facility] ?  ? Rosuvastatin Other (See Comments)  ?  Lower extremity weakness, gait instability and confusion- "Allergic," per [facility]  ? Hydromorphone Other (See Comments)  ?  Very Disoriented & Confused- "Allergic," per [facility]  ? Morphine And Related Other (See Comments)  ?  Catatonic- "Allergic," per [facility]  ? Morphine Other (See Comments)  ?  Confusion- "Allergic," per [facility] ? ?  ? Tramadol Other (See Comments)  ?  Causes confusion- "Allergic," per [facility]  ? ? ?Outpatient Encounter Medications as of 02/01/2022  ?Medication Sig  ? acetaminophen (TYLENOL) 500 MG tablet Take 1,000 mg by mouth every 6 (six) hours as needed for mild pain.  ? bisacodyl (DULCOLAX) 10 MG suppository If not  relieved by MOM, give 10 mg Bisacodyl suppositiory rectally X 1 dose in 24 hours as needed  ? bisacodyl (DULCOLAX) 5 MG EC tablet Take 1 tablet (5 mg total) by mouth daily as needed for moderate constipation.  ? BREO ELLIPTA 100-25 MCG/ACT AEPB Inhale 1 puff into the lungs daily.  ? buPROPion (WELLBUTRIN) 75 MG tablet Take 75 mg by mouth daily.   ? Cholecalciferol (VITAMIN D3) 25 MCG (1000  UT) CAPS Take 1 capsule (1,000 Units total) by mouth 2 (two) times daily.  ? clonazePAM (KLONOPIN) 0.25 MG disintegrating tablet Take 1 tablet (0.25 mg total) by mouth 2 (two) times daily for 5 days.  ? clopidogrel (PLAVIX) 75 MG tablet Take 1 tablet (75 mg total) by mouth daily.  ? COSOPT 22.3-6.8 MG/ML ophthalmic solution Place 1 drop into the left eye 2 (two) times daily.  ? cyanocobalamin 1000 MCG tablet Take 1,000 mcg by mouth daily.  ? diclofenac Sodium (VOLTAREN) 1 % GEL Apply 2 g topically every 6 (six) hours as needed (for pain).  ? enoxaparin (LOVENOX) 30 MG/0.3ML injection Inject 0.3 mLs (30 mg total) into the skin daily for 21 days.  ? famotidine (PEPCID) 10 MG tablet Take 10 mg by mouth every 12 (twelve) hours as needed for heartburn.  ? fluticasone (FLONASE) 50 MCG/ACT nasal spray Place 1 spray into both nostrils daily.  ? furosemide (LASIX) 20 MG tablet Take 20 mg by mouth every other day.  ? guaiFENesin (MUCINEX) 600 MG 12 hr tablet Take 1 tablet (600 mg total) by mouth 2 (two) times daily.  ? Ipratropium-Albuterol (COMBIVENT RESPIMAT) 20-100 MCG/ACT AERS respimat Inhale 2 puffs into the lungs See admin instructions. Inhale 2 puffs into the lungs four times a day and every 4 hours as needed for shortness of breath  ? latanoprost (XALATAN) 0.005 % ophthalmic solution Place 1 drop into both eyes at bedtime.  ? levothyroxine (SYNTHROID) 137 MCG tablet Take 137 mcg by mouth in the morning.  ? Lidocaine-Adhesive Sheets (LIDOPURE PATCH EX) Apply 1 patch topically See admin instructions. Apply 1 patch to the left shoulder every day as needed for pain- on 12 hours/off 12 hours  ? Lifitegrast (XIIDRA) 5 % SOLN Place 1 drop into both eyes in the morning and at bedtime.  ? loperamide (IMODIUM A-D) 2 MG tablet Take 2 mg by mouth every 8 (eight) hours as needed (for diarrhea).  ? loratadine (CLARITIN) 10 MG tablet Take 1 tablet (10 mg total) by mouth daily.  ? Magnesium Hydroxide (MILK OF MAGNESIA PO) If no BM in  3 days, give 30 cc Milk of Magnesium p.o. x 1 dose in 24 hours as needed  ? magnesium oxide (MAG-OX) 400 MG tablet Take 400 mg by mouth daily.  ? mirtazapine (REMERON) 15 MG tablet Take 1 tablet (15 mg total) by mouth at bedtime.  ? NON FORMULARY DIET: Regular diet /Thin Liquids  ? ondansetron (ZOFRAN) 4 MG tablet Take 4 mg by mouth every 6 (six) hours as needed for nausea or vomiting.  ? oxyCODONE (OXY IR/ROXICODONE) 5 MG immediate release tablet Take 1 tablet (5 mg total) by mouth 4 (four) times daily for 5 days.  ? OXYGEN Inhale 4 L/min into the lungs See admin instructions. Inhale 4 L/min of oxygen into the lungs "every shift"  ? pantoprazole (PROTONIX) 40 MG tablet Take 40 mg by mouth in the morning.  ? polyethylene glycol (MIRALAX / GLYCOLAX) 17 g packet Take 17 g by mouth 2 (two) times daily  as needed for mild constipation.  ? prochlorperazine (COMPAZINE) 10 MG tablet Take 10 mg by mouth every 4 (four) hours as needed for nausea or vomiting.  ? QUEtiapine (SEROQUEL) 25 MG tablet Take 1 tablet (25 mg total) by mouth at bedtime.  ? RHOPRESSA 0.02 % SOLN Place 1 drop into both eyes at bedtime.  ? sennosides-docusate sodium (SENOKOT-S) 8.6-50 MG tablet Take 2 tablets by mouth daily as needed for constipation.  ? Sodium Phosphates (RA SALINE ENEMA RE) If not relieved by Biscodyl suppository, give disposable Saline Enema rectally X 1 dose/24 hrs as needed  ? SYSTANE 0.4-0.3 % SOLN Place 1 drop into both eyes 4 (four) times daily.  ? [DISCONTINUED] SYNTHROID 137 MCG tablet Take 68.5 mcg by mouth in the morning.  ? [DISCONTINUED] SYSTANE 0.4-0.3 % GEL ophthalmic gel Place 1 application into both eyes 3 (three) times daily.  ? ?No facility-administered encounter medications on file as of 02/01/2022.  ? ? ?Review of Systems  ?Constitutional:  Negative for appetite change, chills, fatigue and fever.  ?HENT:  Negative for congestion, hearing loss, rhinorrhea and sore throat.   ?Eyes: Negative.   ?Respiratory:  Negative  for cough, shortness of breath and wheezing.   ?Cardiovascular:  Negative for chest pain, palpitations and leg swelling.  ?Gastrointestinal:  Negative for abdominal pain, constipation, diarrhea, nausea and v

## 2022-02-02 ENCOUNTER — Non-Acute Institutional Stay (SKILLED_NURSING_FACILITY): Payer: Medicare HMO | Admitting: Internal Medicine

## 2022-02-02 ENCOUNTER — Encounter: Payer: Self-pay | Admitting: Internal Medicine

## 2022-02-02 DIAGNOSIS — S72002D Fracture of unspecified part of neck of left femur, subsequent encounter for closed fracture with routine healing: Secondary | ICD-10-CM

## 2022-02-02 DIAGNOSIS — J9611 Chronic respiratory failure with hypoxia: Secondary | ICD-10-CM | POA: Diagnosis not present

## 2022-02-02 DIAGNOSIS — R413 Other amnesia: Secondary | ICD-10-CM

## 2022-02-02 DIAGNOSIS — I700 Atherosclerosis of aorta: Secondary | ICD-10-CM

## 2022-02-02 NOTE — Assessment & Plan Note (Signed)
PT/OT and weightbearing at SNF as tolerated. ?

## 2022-02-02 NOTE — Assessment & Plan Note (Signed)
Exam reveals coarse rales over the lower two thirds of the chest which clinically would suggest ILD; but imaging did not mention such. ?

## 2022-02-02 NOTE — Patient Instructions (Signed)
See assessment and plan under each diagnosis in the problem list and acutely for this visit 

## 2022-02-02 NOTE — Assessment & Plan Note (Addendum)
She denies any anginal symptoms at this time but she is minimally mobile. ?

## 2022-02-02 NOTE — Assessment & Plan Note (Signed)
See 02/02/22 responses are unfocused and somewhat concrete. ?

## 2022-02-02 NOTE — Progress Notes (Signed)
? ?NURSING HOME LOCATION:  [facility] ?ROOM NUMBER:  303 A ? ?CODE STATUS:  DNR ? ?PCP:  Richarda Blade NP ? ?This is a comprehensive admission note to this SNFperformed on this date less than 30 days from date of admission. ?Included are preadmission medical/surgical history; reconciled medication list; family history; social history and comprehensive review of systems.  ?Corrections and additions to the records were documented. Comprehensive physical exam was also performed. Additionally a clinical summary was entered for each active diagnosis pertinent to this admission in the Problem List to enhance continuity of care. ? ?HPI: She was hospitalized 4/23 - 01/31/2022, admitted from an ALF for left hip fracture sustained in a mechanical fall.  She has oxygen dependent chronic hypoxic respiratory failure and is followed by Hospice.   ?She had actually had two prior admissions this year related to her chronic respiratory failure. ?Imaging in the ED revealed minimally displaced left trochanteric fracture.  Orthopedic consultant, Dr. Magnus Ivan recommended pain management but no surgery because of the patient's poor mobility and advanced comorbidities.  She was to be mobilized with PT/OT as tolerated.  DVT prophylaxis with Lovenox was recommended x3 weeks.  Placement for rehab and short-term facility was recommended prior to returning to ALF. ? ?Past medical and surgical history: Includes history of COPD with O2 dependent chronic respiratory failure, anxiety/depression, bipolar disorder, hypothyroidism, neurocognitive deficit, CKD stage III, chronic diastolic dysfunction, and history of TIA. ?Surgeries and procedures include arm fracture surgery, pacemaker placement, and right shoulder surgery. ? ?Social history: Despite a diagnosis of COPD; she has never smoked.  Nondrinker. ? ?Family history: Strong family history of cancer. ?  ?Review of systems: Clinical neurocognitive deficits made validity  of responses questionable . ?When I asked if she were having any active issues she stated that she was "doing good surprisingly".  When asked why she had been hospitalized her response was "I fell doing something stupid".  Apparently she usually sleeps in a "big chair"; but she attempted to get in the bed which she described as "high".  Interestingly, when she completed that statement she stated "I like your shoes". ?She told me she was originally from [place] and when I asked how she came to [place]; her response was "we drove".  She denied any pain despite the fracture.  When asked about pain, she apparently did not hear my query initially and responded "whoever painted this room did a good job". ? ? ?Physical exam:  ?Pertinent or positive findings: She appears her age and chronically ill.  Responses are markedly slow and delayed and apparently relate to pausing to breathe between short phrases.  Hair is thin.  Affect is markedly flat.  Facies are masked.  As noted she is apparently hard of hearing.  She has complete dentures.  Heart sounds are markedly distant and only barely heard in the epigastrium.  She has coarse , dry rales over the lower two thirds of the posterior chest.  There is some bronchovesicular quality to the breath sounds over the upper chest.  Pedal pulses are not palpable.  She has trace edema at the sock line.  Varus deformity of the lower extremities is present.  There is interosseous wasting of the hands. ?   ?Lymphatic: No lymphadenopathy about the head, neck, axilla. ?Eyes: No conjunctival inflammation or lid edema is present. There is no scleral icterus. ?Ears:  External ear exam shows no significant lesions or deformities.   ?Nose:  External nasal examination  shows no deformity or inflammation. Nasal mucosa are pink and moist without lesions, exudates ?Oral exam: Lips and gums are healthy appearing.There is no oropharyngeal erythema or exudate. ?Neck:  No thyromegaly, masses,  tenderness noted.    ?Heart:  No gallop, murmur, click, rub.  ?Lungs: without wheezes, rhonchi, rubs. ?Abdomen: Bowel sounds are normal.  Abdomen is soft and nontender with no organomegaly, hernias, masses. ?GU: Deferred  ?Extremities:  No cyanosis, clubbing. ?Balance, Rhomberg, finger to nose testing could not be completed due to clinical state ?Skin: Warm & dry w/o tenting. ?No significant lesions or rash. ? ?See clinical summary under each active problem in the Problem List with associated updated therapeutic plan ? ?

## 2022-02-05 ENCOUNTER — Encounter: Payer: Self-pay | Admitting: Internal Medicine

## 2022-02-05 ENCOUNTER — Non-Acute Institutional Stay (SKILLED_NURSING_FACILITY): Payer: Medicare HMO | Admitting: Internal Medicine

## 2022-02-05 DIAGNOSIS — Z9189 Other specified personal risk factors, not elsewhere classified: Secondary | ICD-10-CM

## 2022-02-05 DIAGNOSIS — J849 Interstitial pulmonary disease, unspecified: Secondary | ICD-10-CM | POA: Diagnosis not present

## 2022-02-05 DIAGNOSIS — S72002D Fracture of unspecified part of neck of left femur, subsequent encounter for closed fracture with routine healing: Secondary | ICD-10-CM | POA: Diagnosis not present

## 2022-02-05 NOTE — Assessment & Plan Note (Signed)
Tylenol and meloxicam will be employed for pain control in lieu of opioids because of the high risk of respiratory suppression and death with opioids. ?

## 2022-02-05 NOTE — Patient Instructions (Addendum)
See assessment and plan under each diagnosis in the problem list and acutely for this visit ?Total time 62 minutes; greater than 50% of the visit spent counseling patient and daughters and coordinating care for problems addressed at this encounter ? ?

## 2022-02-05 NOTE — Assessment & Plan Note (Addendum)
Daughters have requested scheduled oxycodone to help her dyspnea due to "COPD".  I reviewed the CT scan with one of the daughters 5/1 and recommended a short trial of steroids. Risk of opioid suppression of respiratory function discussed. ?If they wish to pursue this; rehab should be deferred and lieu of reestablishing with Hospice. ?

## 2022-02-05 NOTE — Progress Notes (Signed)
? ?NURSING HOME LOCATION:  [facility] ?ROOM NUMBER:  303 ? ?CODE STATUS:  DNR ? ?PCP:  none listed ? ?This is a nursing facility follow up visit for specific acute issue of family request for scheduled oxycodone. ? ?Interim medical record and care since last SNF visit was updated with review of diagnostic studies and change in clinical status since last visit were documented. ? ?HPI: The patient was admitted to this SNF 01/31/2022 for rehab of left hip fracture sustained in a mechanical fall. ?Significantly she has had 2 admissions in February of this year for chronic hypoxic respiratory failure.  Hospice has been following the patient; but her daughter Mrs. [name] Manufacturing systems engineerKevorkian stated she been taken off Hospice to allow completion of the rehab. ?The patient has a diagnosis of chronic hypoxic oxygen dependent respiratory failure due to COPD.  On my exam 4/29 I did note rales which suggested possible interstitial lung disease. ?The initial request to schedule oxycodone was addressed with Mrs. Somersworth LionsKevorkian 5/1.  We consulted for approximately 20-25 minutes and reviewed her most recent CT scans of the chest.  Localized emphysematous changes were noted; but the most striking changes were the coarse interstitial marking increase diffusely . The daughter validated that her mother never smoked as the patient had also told me; but she states that she was exposed to heavy smoking by both parents from birth until young adulthood. ?The patient had denied residual orthopedic pain.  Apparently the daughter does feel the oxycodone helps her dyspnea related to "COPD".  I expressed my concerns about respiratory suppression due to to the opioids.  The daughter indicated that the family was ready for her to go if it were her time. ?Although there was no diagnosis in the chart of ILD or pulmonary fibrosis; the daughter did state that she had been seeing a Pulmonologist, "Dr Bradd Canaryudda" in [place].  She stated they  were not satisfied with his care. Dr. Avie EchevariaSimanta Dutta is a Pulmonologist /Immunologist with [facility]. ?Today I discussed her case with the other daughter Carloyn JaegerFerrilyn  Sourdiffe by phone.Their main concern is that she not exhibit any respiratory distress as she did last month necessitating hospitalization.  She stated that they are now seeing Dr. Ether GriffinsFowler, [place]. ? ?Review of systems: She has had intermittent hip pain up to a level 4 on a 10 scale.  At the time of exam she states she is not in pain.  She describes her breathing as "pretty good".  She denies any cough.  She also denies any paroxysmal nocturnal dyspnea. ? ?Constitutional: No fever, significant weight change  ?Eyes: No redness, discharge, pain, vision change ?ENT/mouth: No nasal congestion,  purulent discharge, earache, change in hearing, sore throat  ?Cardiovascular: No chest pain, palpitations, paroxysmal nocturnal dyspnea, claudication, edema  ?Respiratory: No sputum production, hemoptysis, DOE, significant snoring, apnea   ?Hematologic/lymphatic: No significant bruising, lymphadenopathy, abnormal bleeding ?Allergy/immunology: No itchy/watery eyes, significant sneezing, urticaria, angioedema ? ?Physical exam:  ?Pertinent or positive findings: When I entered the room she was asleep on nasal oxygen.  She exhibited no snoring, hypopnea, or apnea.  She was easily arousable.  Hair is disheveled.  There was slight exotropia of the left eye.  Eyebrows are absent essentially.  She has complete dentures.  Heart sounds are profoundly distant, essentially cannot be auscultated.  Today she did not have to pause after every few words due to apparent dyspnea.  She has coarse rales at the bases,  greater on the right than the left.  There are some bronchovesicular character to the breath sounds superiorly.  Pedal pulses are decreased.  She has trace edema at the ankle.  Slight clubbing of the nailbeds is noted.  There is  interosseous wasting of the hands.  There is irregular bruising faintly over the forearms. ? ?General appearance: no acute distress, increased work of breathing is present.   ?Lymphatic: No lymphadenopathy about the head, neck, axilla. ?Eyes: No conjunctival inflammation or lid edema is present. There is no scleral icterus. ?Ears:  External ear exam shows no significant lesions or deformities.   ?Nose:  External nasal examination shows no deformity or inflammation. Nasal mucosa are pink and moist without lesions, exudates ?Oral exam:There is no oropharyngeal erythema or exudate. ?Neck:  No thyromegaly, masses, tenderness noted.    ?Lungs:  without wheezes, rhonchi, rubs. ?Abdomen: Bowel sounds are normal. Abdomen is soft and nontender with no organomegaly, hernias, masses. ?GU: Deferred  ?Extremities:  No cyanosis ?Skin: Warm & dry w/o tenting. ?No significant lesions or rash. ? ?See summary under each active problem in the Problem List with associated updated therapeutic plan ? ? ?

## 2022-02-05 NOTE — Assessment & Plan Note (Signed)
This risk was discussed with her daughter Jossie Ng by phone 5/2.  I recommend that the daughters decide whether they want to proceed with rehab or place their mother back under the care of Hospice. ?She was also given the option of an additional opinion about any possible inclusion in the clinical trials for ILD/pulmonary fibrosis being conducted through [facility]. ?

## 2022-02-09 ENCOUNTER — Other Ambulatory Visit: Payer: Self-pay

## 2022-02-09 ENCOUNTER — Emergency Department (HOSPITAL_COMMUNITY): Payer: Medicare HMO

## 2022-02-09 ENCOUNTER — Encounter (HOSPITAL_COMMUNITY): Payer: Self-pay | Admitting: Emergency Medicine

## 2022-02-09 ENCOUNTER — Emergency Department (HOSPITAL_COMMUNITY)
Admission: EM | Admit: 2022-02-09 | Discharge: 2022-02-09 | Disposition: A | Discharge status: Skilled Nursing Facility | Payer: Medicare HMO | Attending: Emergency Medicine | Admitting: Emergency Medicine

## 2022-02-09 DIAGNOSIS — R053 Chronic cough: Secondary | ICD-10-CM | POA: Diagnosis not present

## 2022-02-09 DIAGNOSIS — E039 Hypothyroidism, unspecified: Secondary | ICD-10-CM | POA: Diagnosis not present

## 2022-02-09 DIAGNOSIS — R0602 Shortness of breath: Secondary | ICD-10-CM | POA: Insufficient documentation

## 2022-02-09 DIAGNOSIS — R0981 Nasal congestion: Secondary | ICD-10-CM | POA: Insufficient documentation

## 2022-02-09 DIAGNOSIS — Z79899 Other long term (current) drug therapy: Secondary | ICD-10-CM | POA: Diagnosis not present

## 2022-02-09 DIAGNOSIS — N189 Chronic kidney disease, unspecified: Secondary | ICD-10-CM | POA: Insufficient documentation

## 2022-02-09 DIAGNOSIS — J449 Chronic obstructive pulmonary disease, unspecified: Secondary | ICD-10-CM | POA: Diagnosis not present

## 2022-02-09 DIAGNOSIS — Z7951 Long term (current) use of inhaled steroids: Secondary | ICD-10-CM | POA: Diagnosis not present

## 2022-02-09 LAB — BASIC METABOLIC PANEL
Anion gap: 10 (ref 5–15)
BUN: 25 mg/dL — ABNORMAL HIGH (ref 8–23)
CO2: 22 mmol/L (ref 22–32)
Calcium: 9.4 mg/dL (ref 8.9–10.3)
Chloride: 101 mmol/L (ref 98–111)
Creatinine, Ser: 1.01 mg/dL — ABNORMAL HIGH (ref 0.44–1.00)
GFR, Estimated: 53 mL/min — ABNORMAL LOW (ref >60)
Glucose, Bld: 105 mg/dL — ABNORMAL HIGH (ref 70–99)
Potassium: 4.4 mmol/L (ref 3.5–5.1)
Sodium: 133 mmol/L — ABNORMAL LOW (ref 135–145)

## 2022-02-09 LAB — CBC WITH DIFFERENTIAL/PLATELET
Abs Immature Granulocytes: 0.18 10*3/uL — ABNORMAL HIGH (ref 0.00–0.07)
Basophils Absolute: 0 10*3/uL (ref 0.0–0.1)
Basophils Relative: 0 %
Eosinophils Absolute: 0 10*3/uL (ref 0.0–0.5)
Eosinophils Relative: 0 %
HCT: 39.9 % (ref 36.0–46.0)
Hemoglobin: 12.5 g/dL (ref 12.0–15.0)
Immature Granulocytes: 1 %
Lymphocytes Relative: 8 %
Lymphs Abs: 1.2 10*3/uL (ref 0.7–4.0)
MCH: 29.1 pg (ref 26.0–34.0)
MCHC: 31.3 g/dL (ref 30.0–36.0)
MCV: 93 fL (ref 80.0–100.0)
Monocytes Absolute: 1.3 10*3/uL — ABNORMAL HIGH (ref 0.1–1.0)
Monocytes Relative: 8 %
Neutro Abs: 12.4 10*3/uL — ABNORMAL HIGH (ref 1.7–7.7)
Neutrophils Relative %: 83 %
Platelets: 552 10*3/uL — ABNORMAL HIGH (ref 150–400)
RBC: 4.29 MIL/uL (ref 3.87–5.11)
RDW: 14.6 % (ref 11.5–15.5)
WBC: 15.1 10*3/uL — ABNORMAL HIGH (ref 4.0–10.5)
nRBC: 0 % (ref 0.0–0.2)

## 2022-02-09 MED ORDER — CLONAZEPAM 0.25 MG PO TBDP
0.2500 mg | ORAL_TABLET | Freq: Two times a day (BID) | ORAL | 0 refills | Status: AC
Start: 2022-02-09 — End: 2022-02-12

## 2022-02-09 MED ORDER — CLONAZEPAM 0.5 MG PO TABS: 0.2500 mg | ORAL_TABLET | Freq: Once | ORAL | Status: DC

## 2022-02-09 MED ORDER — CLONAZEPAM 0.125 MG PO TBDP
0.2500 mg | ORAL_TABLET | Freq: Once | ORAL | Status: AC
  Administered 2022-02-09: 0.25 mg via ORAL
  Filled 2022-02-09: qty 2

## 2022-02-09 NOTE — ED Notes (Signed)
E-signature pad unavailable at time of pt discharge. This RN discussed discharge materials with pt and family member at bedside, and answered all pt and family member questions. Pt stated understanding of discharge material. ? ?

## 2022-02-09 NOTE — Discharge Instructions (Signed)
You have been evaluated for shortness of breath.  Your work-up did not show anything acute. ?

## 2022-02-09 NOTE — ED Triage Notes (Signed)
Patient BIB [facility] from [facility]. Per EMS called out for shortness of breath, upon assessment appears anxiety related. Per EMS communication between paliative care dr and primary dr on medications has caused stress on pt. Pt A&Ox4, 3LPM [place] @ baseline. NAD. VSS.  ?

## 2022-02-09 NOTE — ED Provider Notes (Signed)
?[facility] ?Provider Note ? ? ?CSN: [id number] ?Arrival date & time: 02/09/22  1556 ? ?  ? ?History ? ?Chief Complaint  ?Patient presents with  ? Shortness of Breath  ?  Congestion x1 month  ? ? ?Sheila Mccoy is a 88 y.o. female. ? ? ?Shortness of Breath ? ?Patient is a 88 year old female with a history of COPD on 4 L baseline, hypothyroidism, CKD who presents to the emergency department from [facility] SNF due to concern for shortness of breath.  Per family member, they report the [facility] medical doctor has not been giving her her Klonopin which make her air hungry.  She has called him and informed him that she feels more shortness of breath when she does not receive her Klonopin.  Per family "they have talked to her SNF Doctor multiple times however given that patient is currently undergoing rehab he refused to give the medication. "  They are planning to go back to hospice care on Monday 5/8 however they are hoping for her to continue her chronic medications.  Patient currently reports significant improvement of her shortness of breath.  She denies chest pain.  She does have chronic cough and congestion for the past month.  She denies any fever.  Otherwise no other complaints. ? ?Home Medications ?Prior to Admission medications   ?Medication Sig Start Date End Date Taking? Authorizing Provider  ?acetaminophen (TYLENOL) 500 MG tablet Take 1,000 mg by mouth every 6 (six) hours as needed for mild pain.    [provider]  ?bisacodyl (DULCOLAX) 10 MG suppository If not relieved by MOM, give 10 mg Bisacodyl suppositiory rectally X 1 dose in 24 hours as needed    [provider]  ?bisacodyl (DULCOLAX) 5 MG EC tablet Take 1 tablet (5 mg total) by mouth daily as needed for moderate constipation. 01/28/22   Barb Merino, MD  ?Memory Dance ELLIPTA 100-25 MCG/ACT AEPB Inhale 1 puff into the lungs daily. 11/20/21   [provider]  ?buPROPion (WELLBUTRIN) 75 MG  tablet Take 75 mg by mouth daily.  06/13/20   [provider]  ?Cholecalciferol (VITAMIN D3) 25 MCG (1000 UT) CAPS Take 1 capsule (1,000 Units total) by mouth 2 (two) times daily. 11/27/21   Bonnielee Haff, MD  ?clonazePAM (KLONOPIN) 0.25 MG disintegrating tablet Take 1 tablet (0.25 mg total) by mouth 2 (two) times daily for 3 days. 02/09/22 02/12/22  Donnamarie Poag, MD  ?clopidogrel (PLAVIX) 75 MG tablet Take 1 tablet (75 mg total) by mouth daily. 12/28/20   Ngetich, Dinah C, NP  ?COSOPT 22.3-6.8 MG/ML ophthalmic solution Place 1 drop into the left eye 2 (two) times daily.    [provider]  ?cyanocobalamin 1000 MCG tablet Take 1,000 mcg by mouth daily.    [provider]  ?diclofenac Sodium (VOLTAREN) 1 % GEL Apply 2 g topically every 6 (six) hours as needed (for pain).    [provider]  ?enoxaparin (LOVENOX) 30 MG/0.3ML injection Inject 0.3 mLs (30 mg total) into the skin daily for 21 days. 01/31/22 02/21/22  Barb Merino, MD  ?famotidine (PEPCID) 10 MG tablet Take 10 mg by mouth every 12 (twelve) hours as needed for heartburn.    [provider]  ?fluticasone (FLONASE) 50 MCG/ACT nasal spray Place 1 spray into both nostrils daily. 01/18/21   Mercy Riding, MD  ?furosemide (LASIX) 20 MG tablet Take 20 mg by mouth every other day.    [provider]  ?guaiFENesin (  MUCINEX) 600 MG 12 hr tablet Take 1 tablet (600 mg total) by mouth 2 (two) times daily. 02/09/21   Ngetich, Donalee Citrin, NP  ?Ipratropium-Albuterol (COMBIVENT RESPIMAT) 20-100 MCG/ACT AERS respimat Inhale 2 puffs into the lungs See admin instructions. Inhale 2 puffs into the lungs four times a day and every 4 hours as needed for shortness of breath    [provider]  ?latanoprost (XALATAN) 0.005 % ophthalmic solution Place 1 drop into both eyes at bedtime.    [provider]  ?levothyroxine (SYNTHROID) 137 MCG tablet Take 137 mcg by mouth in the morning.    [provider]   ?Lidocaine-Adhesive Sheets [facility] PATCH EX) Apply 1 patch topically See admin instructions. Apply 1 patch to the left shoulder every day as needed for pain- on 12 hours/off 12 hours    [provider]  ?Lifitegrast (XIIDRA) 5 % SOLN Place 1 drop into both eyes in the morning and at bedtime.    [provider]  ?loperamide (IMODIUM A-D) 2 MG tablet Take 2 mg by mouth every 8 (eight) hours as needed (for diarrhea). 07/23/21   [provider]  ?loratadine (CLARITIN) 10 MG tablet Take 1 tablet (10 mg total) by mouth daily. 01/19/21   Almon Hercules, MD  ?Magnesium Hydroxide (MILK OF MAGNESIA PO) If no BM in 3 days, give 30 cc Milk of Magnesium p.o. x 1 dose in 24 hours as needed    [provider]  ?magnesium oxide (MAG-OX) 400 MG tablet Take 400 mg by mouth daily.    [provider]  ?mirtazapine (REMERON) 15 MG tablet Take 1 tablet (15 mg total) by mouth at bedtime. 02/09/21   Ngetich, Donalee Citrin, NP  ?NON FORMULARY DIET: Regular diet /Thin Liquids    [provider]  ?ondansetron (ZOFRAN) 4 MG tablet Take 4 mg by mouth every 6 (six) hours as needed for nausea or vomiting.    [provider]  ?oxyCODONE (OXY IR/ROXICODONE) 5 MG immediate release tablet Take 1 tablet (5 mg total) by mouth every 6 (six) hours as needed for severe pain. 02/01/22   Medina-Vargas, Monina C, NP  ?OXYGEN Inhale 4 L/min into the lungs See admin instructions. Inhale 4 L/min of oxygen into the lungs "every shift"    [provider]  ?pantoprazole (PROTONIX) 40 MG tablet Take 40 mg by mouth in the morning. 11/01/20   [provider]  ?polyethylene glycol (MIRALAX / GLYCOLAX) 17 g packet Take 17 g by mouth 2 (two) times daily as needed for mild constipation. 01/18/21   Almon Hercules, MD  ?prochlorperazine (COMPAZINE) 10 MG tablet Take 10 mg by mouth every 4 (four) hours as needed for nausea or vomiting.    [provider]  ?QUEtiapine (SEROQUEL) 25 MG tablet Take  1 tablet (25 mg total) by mouth at bedtime. 03/28/21   York Spaniel, MD  ?RHOPRESSA 0.02 % SOLN Place 1 drop into both eyes at bedtime. 11/18/21   [provider]  ?sennosides-docusate sodium (SENOKOT-S) 8.6-50 MG tablet Take 2 tablets by mouth daily as needed for constipation.    [provider]  ?Sodium Phosphates (RA SALINE ENEMA RE) If not relieved by Biscodyl suppository, give disposable Saline Enema rectally X 1 dose/24 hrs as needed    [provider]  ?SYSTANE 0.4-0.3 % SOLN Place 1 drop into both eyes 4 (four) times daily.    [provider]  ?   ? ?Allergies    ?Atorvastatin,  Nitrofurantoin, Rosuvastatin, Hydromorphone, Morphine and related, Morphine, and Tramadol   ? ?Review of Systems   ?Review of Systems  ?Respiratory:  Positive for shortness of breath.   ? ?Physical Exam ?Updated Vital Signs ?BP (!) 149/68   Pulse 60   Temp 98.1 ?F (36.7 ?C) (Oral)   Resp 15   SpO2 96%  ?Physical Exam ?Constitutional:   ?   Comments: Chronically ill-appearing  ?HENT:  ?   Head: Normocephalic.  ?Eyes:  ?   Extraocular Movements: Extraocular movements intact.  ?   Pupils: Pupils are equal, round, and reactive to light.  ?Cardiovascular:  ?   Rate and Rhythm: Normal rate.  ?Pulmonary:  ?   Effort: No tachypnea or respiratory distress.  ?   Comments: On 4 L nasal cannula/baseline ?Chest:  ?   Chest wall: No mass.  ?Abdominal:  ?   Tenderness: There is no guarding or rebound.  ?Musculoskeletal:  ?   Right lower leg: No tenderness.  ?   Left lower leg: No tenderness.  ?Skin: ?   General: Skin is warm.  ?Neurological:  ?   General: No focal deficit present.  ?   Mental Status: She is alert and oriented to person, place, and time.  ?Psychiatric:     ?   Mood and Affect: Mood normal.  ? ? ?ED Results / Procedures / Treatments   ?Labs ?(all labs ordered are listed, but only abnormal results are displayed) ?Labs Reviewed  ?BASIC METABOLIC PANEL - Abnormal; Notable for the following  components:  ?    Result Value  ? Sodium 133 (*)   ? Glucose, Bld 105 (*)   ? BUN 25 (*)   ? Creatinine, Ser 1.01 (*)   ? GFR, Estimated 53 (*)   ? All other components within normal limits  ?CBC WITH DIFFERENTIAL/PLATELE

## 2022-02-11 ENCOUNTER — Encounter: Payer: Self-pay | Admitting: Adult Health

## 2022-02-11 ENCOUNTER — Non-Acute Institutional Stay (SKILLED_NURSING_FACILITY): Payer: Medicare HMO | Admitting: Adult Health

## 2022-02-11 DIAGNOSIS — K5901 Slow transit constipation: Secondary | ICD-10-CM | POA: Diagnosis not present

## 2022-02-11 DIAGNOSIS — J9611 Chronic respiratory failure with hypoxia: Secondary | ICD-10-CM | POA: Diagnosis not present

## 2022-02-11 DIAGNOSIS — J449 Chronic obstructive pulmonary disease, unspecified: Secondary | ICD-10-CM

## 2022-02-11 DIAGNOSIS — S72002D Fracture of unspecified part of neck of left femur, subsequent encounter for closed fracture with routine healing: Secondary | ICD-10-CM | POA: Diagnosis not present

## 2022-02-11 NOTE — Progress Notes (Addendum)
Location:  [facility] Room Number: 303-A Place of Service:  SNF (31) Provider:  Kenard Gower, DNP, FNP-BC  Patient Care Team: Pcp, No as PCP - General Ihor Austin, NP as Nurse Practitioner (Neurology) Avie Echevaria, MD as Referring Physician (Pulmonary Disease) Elder Love, DO as Referring Physician (Family Medicine)  Extended Emergency Contact Information Primary Emergency Contact: Mertha Finders Mobile Phone: [phone] Relation: Daughter Secondary Emergency Contact: SOURDIFFE,FERRILYN Home Phone: [phone] Relation: Daughter  Code Status:  DNR  Goals of care: Advanced Directive information    02/11/2022    9:51 AM  Advanced Directives  Does Patient Have a Medical Advance Directive? Yes  Type of Advance Directive Out of facility DNR (pink MOST or yellow form)  Does patient want to make changes to medical advance directive? No - Patient declined  Pre-existing out of facility DNR order (yellow form or pink MOST form) Yellow form placed in chart (order not valid for inpatient use)     Chief Complaint  Patient presents with   Acute Visit    Follow-up ED visit    HPI:  Pt is a 88 y.o. female seen today to follow up ED visit on 02/09/22 for shortness of breath. Family requested for patient to be transferred to ED for concerns of not getting her chronic medication including her Klonopin.  Of note, she was having short-term rehabilitation at [facility] post hospital  4/23 to 4/27 for left fracture.  In the ED, patient reported shortness of breath improved. She was discharged back to [facility] with few dose of Klonopin p.o. she can reestablish hospice care.  Family agreed.  She was seen in the room today and complained of having constipation.  Past Medical History:  Diagnosis Date   Acute metabolic encephalopathy 07/09/2020   Adnexal cyst 07/09/2020   Anxiety    Artificial pacemaker    Bipolar disorder (HCC)    COPD (chronic  obstructive pulmonary disease) (HCC)    Depression    History of CT scan 2021   Legs.   Hypothyroidism 09/30/2020   Memory difficulty 01/02/2021   Sacral fracture (HCC) 07/09/2020   TIA (transient ischemic attack) 09/29/2020   UTI (urinary tract infection) 07/09/2020   Past Surgical History:  Procedure Laterality Date   arm fracture surgery     MRI  2021   Lungs.   right shoulder      pins in right shoulder.    Allergies  Allergen Reactions   Atorvastatin Other (See Comments)    Muscle paralysis and unable to use the legs- "Allergic," per [facility]   Nitrofurantoin Other (See Comments)    PT reports possible allergy -- weakness, tiredness when taking- "Allergic," per [facility]    Rosuvastatin Other (See Comments)    Lower extremity weakness, gait instability and confusion- "Allergic," per [facility]   Hydromorphone Other (See Comments)    Very Disoriented & Confused- "Allergic," per MAR   Morphine And Related Other (See Comments)    Catatonic- "Allergic," per MAR   Morphine Other (See Comments)    Confusion- "Allergic," per [facility]     Tramadol Other (See Comments)    Causes confusion- "Allergic," per [facility]    Outpatient Encounter Medications as of 02/11/2022  Medication Sig   acetaminophen (TYLENOL) 325 MG tablet Take 650 mg by mouth every 8 (eight) hours as needed.   albuterol (ACCUNEB) 0.63 MG/3ML nebulizer solution Take 1 ampule by nebulization every 6 (six) hours as needed for shortness of breath.   bisacodyl (DULCOLAX) 10  MG suppository If not relieved by MOM, give 10 mg Bisacodyl suppositiory rectally X 1 dose in 24 hours as needed   bisacodyl (DULCOLAX) 5 MG EC tablet Take 1 tablet (5 mg total) by mouth daily as needed for moderate constipation.   BREO ELLIPTA 100-25 MCG/ACT AEPB Inhale 1 puff into the lungs daily.   buPROPion (WELLBUTRIN) 75 MG tablet Take 75 mg by mouth daily.    Cholecalciferol (VITAMIN D3) 25 MCG (1000 UT) CAPS Take 1 capsule (1,000 Units total) by mouth 2 (two)  times daily.   clonazePAM (KLONOPIN) 0.25 MG disintegrating tablet Take 1 tablet (0.25 mg total) by mouth 2 (two) times daily for 3 days.   [START ON 02/13/2022] clonazePAM (KLONOPIN) 0.25 MG disintegrating tablet Take 0.25 mg by mouth 2 (two) times daily as needed.   clopidogrel (PLAVIX) 75 MG tablet Take 1 tablet (75 mg total) by mouth daily.   COSOPT 22.3-6.8 MG/ML ophthalmic solution Place 1 drop into the left eye 2 (two) times daily.   cyanocobalamin 1000 MCG tablet Take 1,000 mcg by mouth daily.   diclofenac Sodium (VOLTAREN) 1 % GEL Apply 2 g topically every 6 (six) hours as needed (for pain).   enoxaparin (LOVENOX) 30 MG/0.3ML injection Inject 0.3 mLs (30 mg total) into the skin daily for 21 days.   famotidine (PEPCID) 10 MG tablet Take 10 mg by mouth every 12 (twelve) hours as needed for heartburn.   fluticasone (FLONASE) 50 MCG/ACT nasal spray Place 1 spray into both nostrils daily.   furosemide (LASIX) 20 MG tablet Take 20 mg by mouth every other day.   guaiFENesin (MUCINEX) 600 MG 12 hr tablet Take 1 tablet (600 mg total) by mouth 2 (two) times daily.   Ipratropium-Albuterol (COMBIVENT RESPIMAT) 20-100 MCG/ACT AERS respimat Inhale 2 puffs into the lungs See admin instructions. Inhale 2 puffs into the lungs four times a day and every 4 hours as needed for shortness of breath   latanoprost (XALATAN) 0.005 % ophthalmic solution Place 1 drop into both eyes at bedtime.   levothyroxine (SYNTHROID) 137 MCG tablet Take 137 mcg by mouth in the morning.   Lidocaine-Adhesive Sheets (LIDOPURE PATCH EX) Apply 1 patch topically See admin instructions. Apply 1 patch to the left shoulder every day as needed for pain- on 12 hours/off 12 hours   Lifitegrast (XIIDRA) 5 % SOLN Place 1 drop into both eyes in the morning and at bedtime.   loperamide (IMODIUM A-D) 2 MG tablet Take 2 mg by mouth every 8 (eight) hours as needed (for diarrhea).   loratadine (CLARITIN) 10 MG tablet Take 1 tablet (10 mg total) by  mouth daily.   Magnesium Hydroxide (MILK OF MAGNESIA PO) If no BM in 3 days, give 30 cc Milk of Magnesium p.o. x 1 dose in 24 hours as needed   magnesium oxide (MAG-OX) 400 MG tablet Take 400 mg by mouth daily.   meloxicam (MOBIC) 7.5 MG tablet Take 7.5 mg by mouth in the morning and at bedtime.   mirtazapine (REMERON) 15 MG tablet Take 1 tablet (15 mg total) by mouth at bedtime.   NON FORMULARY DIET: Regular diet /Thin Liquids   ondansetron (ZOFRAN) 4 MG tablet Take 4 mg by mouth every 6 (six) hours as needed for nausea or vomiting.   oxyCODONE (OXY IR/ROXICODONE) 5 MG immediate release tablet Take 1 tablet (5 mg total) by mouth every 6 (six) hours as needed for severe pain.   OXYGEN Inhale 4 L/min into the lungs See  admin instructions. Inhale 4 L/min of oxygen into the lungs "every shift"   pantoprazole (PROTONIX) 40 MG tablet Take 40 mg by mouth in the morning.   polyethylene glycol (MIRALAX / GLYCOLAX) 17 g packet Take 17 g by mouth 2 (two) times daily as needed for mild constipation.   predniSONE (DELTASONE) 10 MG tablet Take 10 mg by mouth 3 (three) times daily.   prochlorperazine (COMPAZINE) 10 MG tablet Take 10 mg by mouth every 4 (four) hours as needed for nausea or vomiting.   QUEtiapine (SEROQUEL) 25 MG tablet Take 1 tablet (25 mg total) by mouth at bedtime.   RHOPRESSA 0.02 % SOLN Place 1 drop into both eyes at bedtime.   sennosides-docusate sodium (SENOKOT-S) 8.6-50 MG tablet Take 2 tablets by mouth daily as needed for constipation.   Sodium Phosphates (RA SALINE ENEMA RE) If not relieved by Biscodyl suppository, give disposable Saline Enema rectally X 1 dose/24 hrs as needed   SYSTANE 0.4-0.3 % SOLN Place 1 drop into both eyes 4 (four) times daily.   [DISCONTINUED] acetaminophen (TYLENOL) 500 MG tablet Take 1,000 mg by mouth every 6 (six) hours as needed for mild pain.   No facility-administered encounter medications on file as of 02/11/2022.    Review of Systems  Constitutional:   Negative for appetite change, chills, fatigue and fever.  HENT:  Negative for congestion, hearing loss, rhinorrhea and sore throat.   Eyes: Negative.   Respiratory:  Negative for cough, shortness of breath and wheezing.   Cardiovascular:  Negative for chest pain, palpitations and leg swelling.  Gastrointestinal:  Positive for constipation. Negative for abdominal pain, diarrhea, nausea and vomiting.  Genitourinary:  Negative for dysuria.  Musculoskeletal:  Negative for arthralgias, back pain and myalgias.  Skin:  Negative for color change, rash and wound.  Neurological:  Negative for dizziness, weakness and headaches.  Psychiatric/Behavioral:  Negative for behavioral problems. The patient is not nervous/anxious.       Immunization History  Administered Date(s) Administered   Influenza, High Dose Seasonal PF 08/14/2020   Pertinent  Health Maintenance Due  Topic Date Due   DEXA SCAN  Never done   INFLUENZA VACCINE  05/07/2022      01/29/2022    8:20 PM 01/30/2022    8:31 AM 01/30/2022    8:30 PM 01/31/2022    8:00 AM 02/09/2022    4:25 PM  Fall Risk  Patient Fall Risk Level High fall risk High fall risk High fall risk High fall risk High fall risk     Vitals:   02/11/22 0949  BP: 134/66  Pulse: 70  Resp: 20  Temp: 98.4 F (36.9 C)  SpO2: 97%  Weight: 141 lb 9.6 oz (64.2 kg)  Height: 5\' 3"  (1.6 m)   Body mass index is 25.08 kg/m.  Physical Exam Constitutional:      General: She is not in acute distress.    Appearance: Normal appearance.  HENT:     Head: Normocephalic and atraumatic.     Nose: Nose normal.     Mouth/Throat:     Mouth: Mucous membranes are moist.  Eyes:     Conjunctiva/sclera: Conjunctivae normal.  Cardiovascular:     Rate and Rhythm: Normal rate and regular rhythm.  Pulmonary:     Effort: Pulmonary effort is normal.     Breath sounds: Normal breath sounds.     Comments: On 4L/min O2  via  Abdominal:     General: Bowel sounds are normal.  Palpations: Abdomen is soft.  Musculoskeletal:        General: Normal range of motion.     Cervical back: Normal range of motion.  Skin:    General: Skin is warm and dry.  Neurological:     General: No focal deficit present.     Mental Status: She is alert and oriented to person, place, and time.  Psychiatric:        Mood and Affect: Mood normal.        Behavior: Behavior normal.       Labs reviewed: Recent Labs    12/01/21 0640 01/27/22 1455 02/09/22 1721  NA 131* 135 133*  K 4.5 4.2 4.4  CL 104 97* 101  CO2 19* 26 22  GLUCOSE 122* 120* 105*  BUN 23 21 25*  CREATININE 0.70 0.89 1.01*  CALCIUM 9.1 9.6 9.4   Recent Labs    11/29/21 2140  AST 19  ALT 28  ALKPHOS 99  BILITOT 0.6  PROT 7.2  ALBUMIN 3.7   Recent Labs    11/22/21 2301 11/23/21 0107 11/29/21 2140 11/30/21 0600 12/02/21 0401 01/27/22 1455 02/09/22 1721  WBC 9.5   < > 17.4*   < > 21.7* 15.4* 15.1*  NEUTROABS 5.6  --  13.5*  --   --   --  12.4*  HGB 14.3   < > 14.7   < > 13.2 13.6 12.5  HCT 44.1   < > 45.3   < > 40.1 42.8 39.9  MCV 88.2   < > 89.7   < > 89.1 90.7 93.0  PLT 371   < > 432*   < > 385 389 552*   < > = values in this interval not displayed.   Lab Results  Component Value Date   TSH 1.184 10/01/2020   Lab Results  Component Value Date   HGBA1C 5.2 09/30/2020   Lab Results  Component Value Date   CHOL 208 (H) 09/30/2020   HDL 58 09/30/2020   LDLCALC 134 (H) 09/30/2020   TRIG 81 09/30/2020   CHOLHDL 3.6 09/30/2020    Significant Diagnostic Results in last 30 days:  CT Lumbar Spine Wo Contrast  Result Date: 01/27/2022 CLINICAL DATA:  Back trauma, no prior imaging (Age >= 16y). Hip trauma, fracture suspected, xray done EXAM: CT LUMBAR SPINE WITHOUT CONTRAST TECHNIQUE: Multidetector CT imaging of the lumbar spine was performed without intravenous contrast administration. Multiplanar CT image reconstructions were also generated. RADIATION DOSE REDUCTION: This exam was performed  according to the departmental dose-optimization program which includes automated exposure control, adjustment of the mA and/or kV according to patient size and/or use of iterative reconstruction technique. COMPARISON:  CT lumbar spine 07/09/2020 FINDINGS: Segmentation: 5 lumbar type vertebrae. Alignment: Normal. Vertebrae: Interval worsening of a chronic L1 compression fracture with now up to 60% height loss anteriorly. Associated old nonunionized right L1 transverse process fracture and 4 mm retropulsion into the central canal. Multilevel severe degenerative changes of the spine with no evidence of associated severe osseous neural foraminal or osseous central canal stenosis. No focal pathologic process. Old healed S4-S5 sacral fracture. Paraspinal and other soft tissues: Negative. Disc levels: Intervertebral disc space vacuum phenomenon at the L1-L2, L2-L3, L3-L4, L4-L5 levels. Other: Atherosclerotic plaque.  Colonic diverticulosis. IMPRESSION: 1. Interval worsening of a chronic L1 compression fracture with now up to 60% height loss anteriorly. Associated 4 mm retropulsion into the central canal. Correlate with point tenderness to palpation to evaluate for an acute  component. 2.  Aortic Atherosclerosis (ICD10-I70.0). 3. Please see separately dictated CT left hip 01/27/2022. Electronically Signed   By: Tish Frederickson M.D.   On: 01/27/2022 15:13   CT Hip Left Wo Contrast  Result Date: 01/27/2022 CLINICAL DATA:  Hip trauma, fracture suspected, xray done trochanteric deformity EXAM: CT OF THE LEFT HIP WITHOUT CONTRAST TECHNIQUE: Multidetector CT imaging of the left hip was performed according to the standard protocol. Multiplanar CT image reconstructions were also generated. RADIATION DOSE REDUCTION: This exam was performed according to the departmental dose-optimization program which includes automated exposure control, adjustment of the mA and/or kV according to patient size and/or use of iterative  reconstruction technique. COMPARISON:  CT pelvis 07/09/2020 FINDINGS: Bones/Joint/Cartilage Minimally displaced greater trochanter fracture with extension to the lateral left femoral neck (9:54). No definite extension to the lesser trochanter. No acute displaced fracture of the left pelvis. No hip dislocation. Old healed S4-S5 sacral fracture. No aggressive appearing osseous lesion. No severe degenerative changes of the left hip. Degenerative changes of the pubic symphysis. No joint effusion. Ligaments Suboptimally assessed by CT. Muscles and Tendons Grossly unremarkable. Soft tissues No large hematoma formation. Other: Atherosclerotic plaque.  Scattered colonic diverticulosis. IMPRESSION: Minimally displaced greater trochanter fracture with extension to the lateral left femoral neck. Electronically Signed   By: Tish Frederickson M.D.   On: 01/27/2022 15:08   DG Chest Port 1 View  Result Date: 02/09/2022 CLINICAL DATA:  Shortness of breath EXAM: PORTABLE CHEST 1 VIEW COMPARISON:  11/29/2021 and prior studies FINDINGS: The cardiomediastinal silhouette is unremarkable. A LEFT pacemaker is again identified. Equivocal faint opacity overlying RIGHT LOWER lung is noted. No other significant changes noted. No pneumothorax or large pleural effusion. Surgical hardware within the proximal RIGHT humerus again noted. IMPRESSION: Equivocal faint opacity overlying the RIGHT LOWER lung which may represent atelectasis or airspace disease. Electronically Signed   By: Harmon Pier M.D.   On: 02/09/2022 18:28   DG HIP UNILAT WITH PELVIS 2-3 VIEWS LEFT  Result Date: 01/27/2022 CLINICAL DATA:  Fall, pain EXAM: DG HIP (WITH OR WITHOUT PELVIS) 2-3V LEFT COMPARISON:  None. FINDINGS: Osteopenia. Fragmentation of the left greater trochanter, of uncertain acuity. There is no other evidence of displaced hip fracture or dislocation. There is no evidence of arthropathy or other focal bone abnormality. Nonobstructive pattern of overlying  bowel gas. IMPRESSION: 1. Fragmentation of the left greater trochanter, of uncertain acuity. Correlate for acute pain and point tenderness. CT or MRI may used to further evaluate. 2. No other evidence of displaced hip fracture or dislocation. 3. Osteopenia. Electronically Signed   By: Jearld Lesch M.D.   On: 01/27/2022 14:04    Assessment/Plan  1. Chronic respiratory failure with hypoxia (HCC) -   will continue O2 at 4 L/min via [place] -    Clonazepam 0.25 mg 1 tab twice a day x3 days then as needed x14 days  2. Chronic obstructive pulmonary disease, unspecified COPD type (HCC) -   Continue mucus relief ER, fluticasone-vilanterol, Combivent Respimat inhaler and prednisone -   continue Palliative care  3. Closed fracture of left hip with routine healing, subsequent encounter -Continue PT and OT, for therapeutic strengthening exercises -   Continue oxycodone IR 5 mg 1 tab every 6 hours PRN for pain and meloxicam 7.5 mg 1 tab twice a day x7 days  4.  Slow transit constipation -   Will start on MiraLAX 17 g daily x5 days then PRN , increase senna -S 8.6-50 mg 2  tabs BIDand Dulcolax 10 mg suppository 1 rectally x1   Family/ staff Communication: Discussed plan of care with resident and charge nurse.  Labs/tests ordered:  None    Kenard Gower, DNP, MSN, FNP-BC [facility] [phone] (Monday-Friday 8:00 a.m. - 5:00 p.m.) [phone] (after hours)

## 2022-03-01 ENCOUNTER — Encounter (HOSPITAL_BASED_OUTPATIENT_CLINIC_OR_DEPARTMENT_OTHER): Payer: Medicare HMO | Admitting: Orthopaedic Surgery

## 2022-03-07 ENCOUNTER — Encounter (HOSPITAL_BASED_OUTPATIENT_CLINIC_OR_DEPARTMENT_OTHER): Payer: Medicare HMO | Admitting: Orthopaedic Surgery

## 2022-04-18 IMAGING — CR DG CHEST 2V
2 series · 2 of 2 positions shown · non-contrast
Comparison: None.

CLINICAL DATA: Dyspnea for 6 months

EXAM:
CHEST - 2 VIEW

[w chest pa]
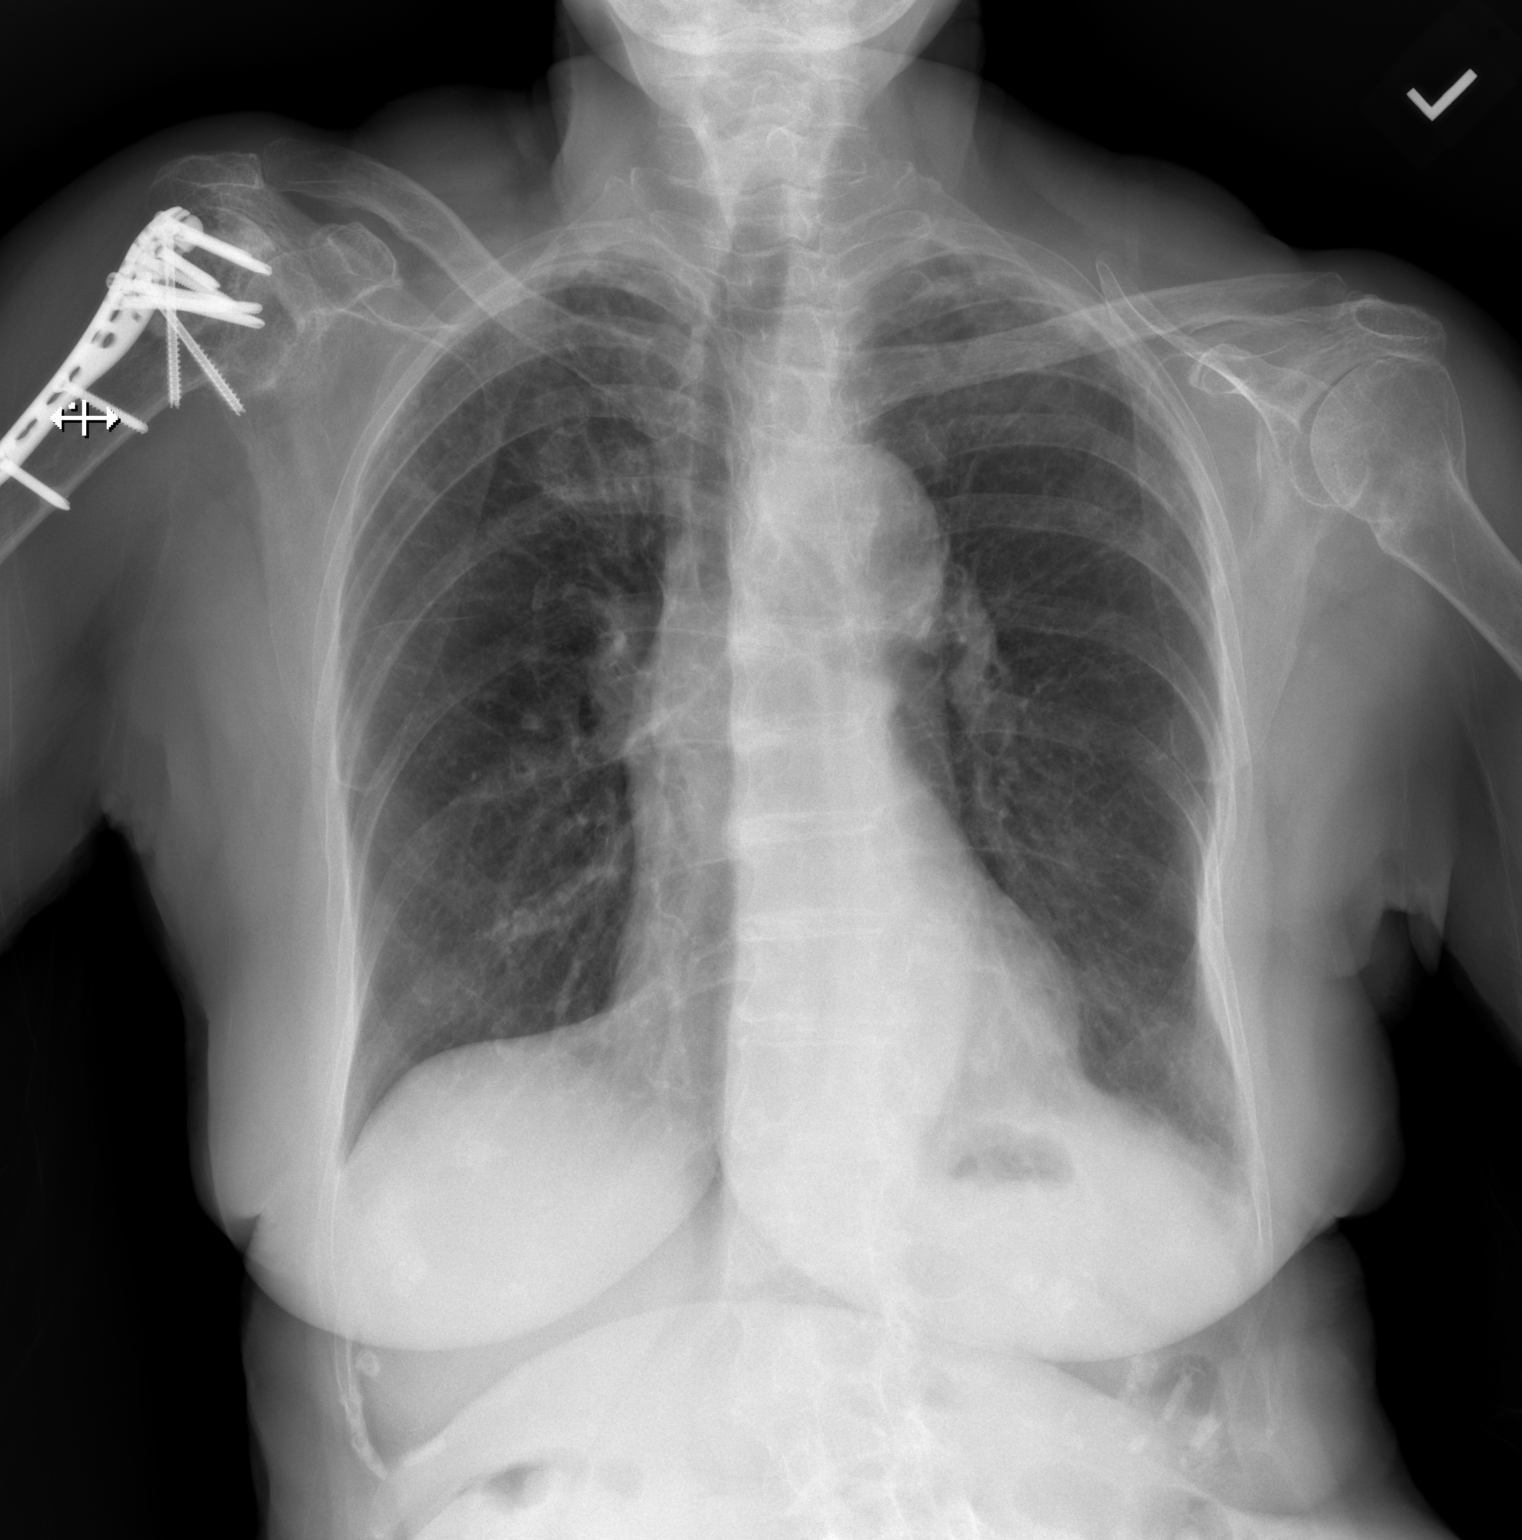

[w chest lat]
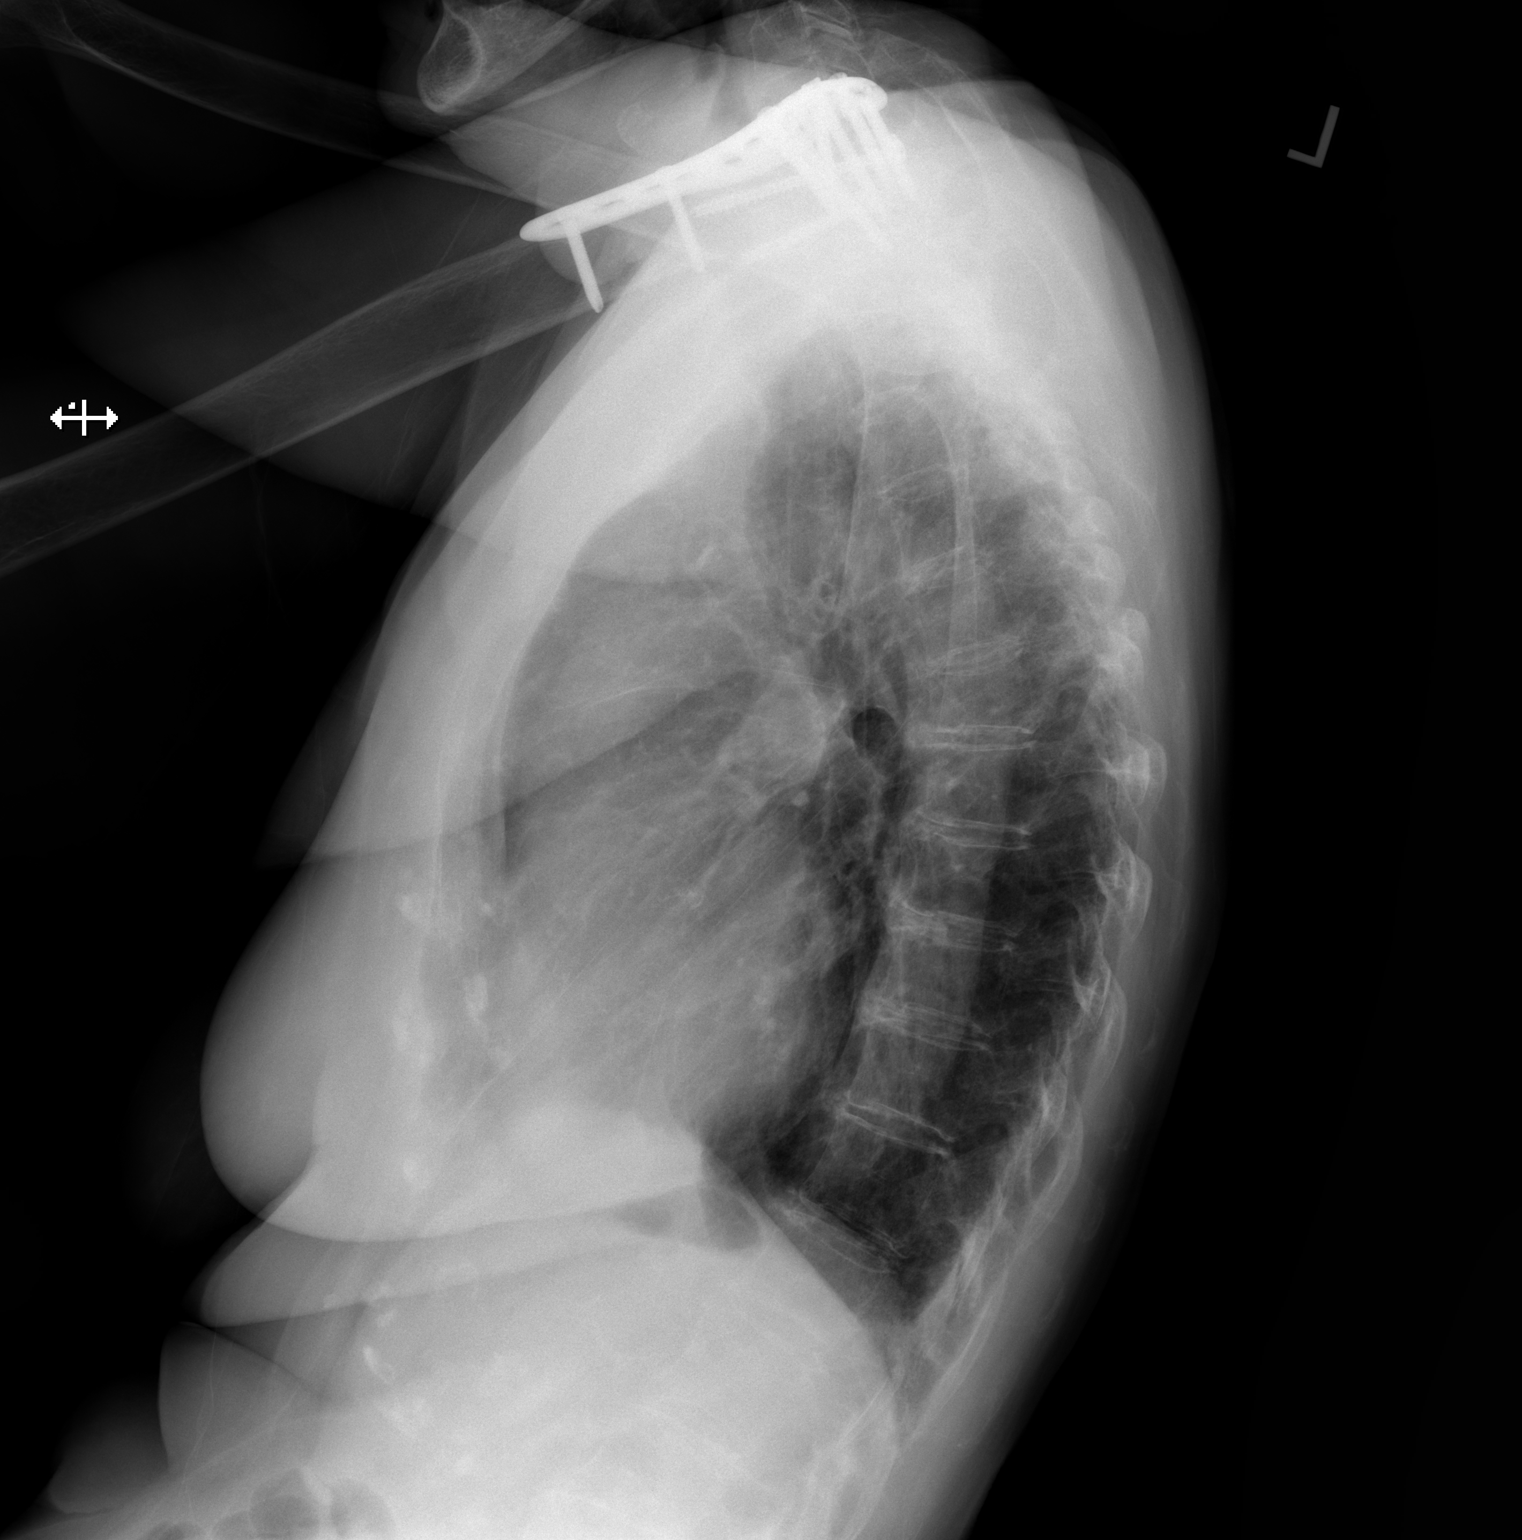

[2 of 2 positions shown; findings below may reference images not displayed]

FINDINGS: Frontal and lateral views of the chest demonstrate an unremarkable
cardiac silhouette. No acute airspace disease, effusion, or
pneumothorax. Background interstitial prominence likely reflects
chronic interstitial lung disease. Postsurgical changes right
shoulder.
IMPRESSION: 1. Interstitial prominence most consistent with chronic interstitial
lung disease.
2. No acute process.

## 2022-10-02 IMAGING — CT CT ANGIO HEAD
1 of 11 series · 5 of 33 positions shown · IV contrast (omnipaque)
Comparison: MRI September 30, 2020. CT head September 29, 2020.

CLINICAL DATA: Stroke/TIA.

EXAM:
CT ANGIOGRAPHY HEAD AND NECK
TECHNIQUE: Multidetector CT imaging of the head and neck was performed using
the standard protocol during bolus administration of intravenous
contrast. Multiplanar CT image reconstructions and MIPs were
obtained to evaluate the vascular anatomy. Carotid stenosis
measurements (when applicable) are obtained utilizing NASCET
criteria, using the distal internal carotid diameter as the
denominator.
CONTRAST:  50mL OMNIPAQUE IOHEXOL 350 MG/ML SOLN

[Series 11: cta neck axial · axial · 0.46mm/px · z∈[-281,-51]mm · 5 of 350 slices shown]
[im 59/350  soft-tissue]
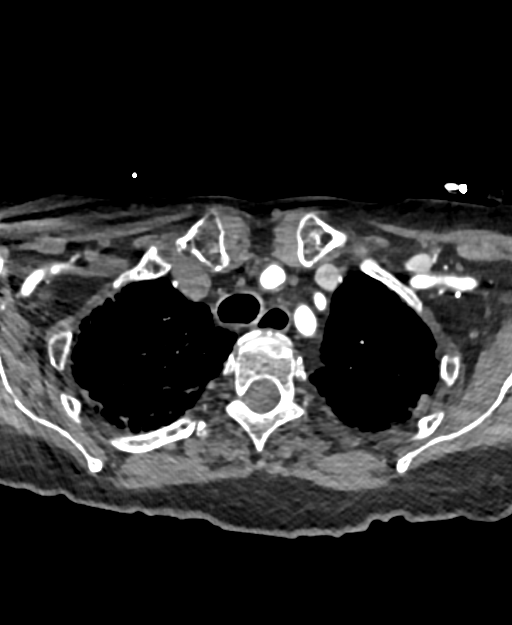
[im 117/350  bone]
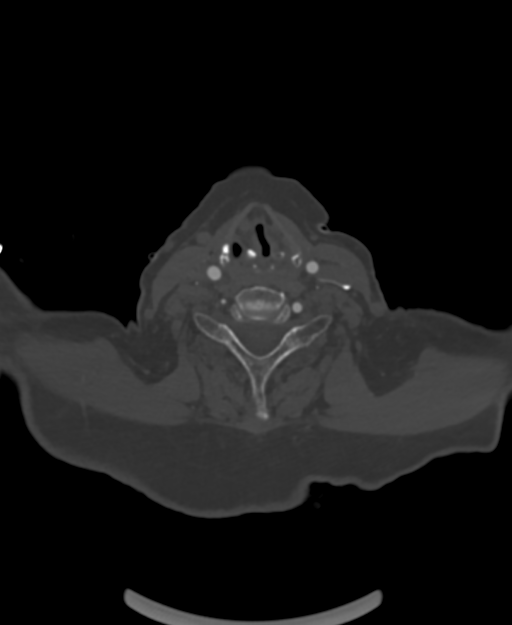
[im 175/350  soft-tissue]
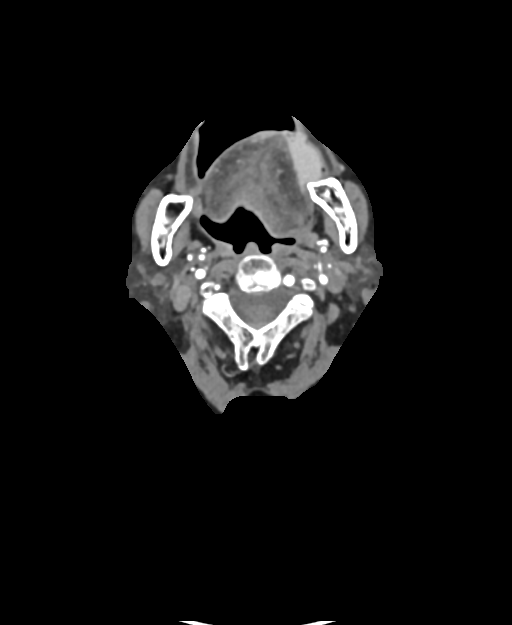
[im 233/350  bone]
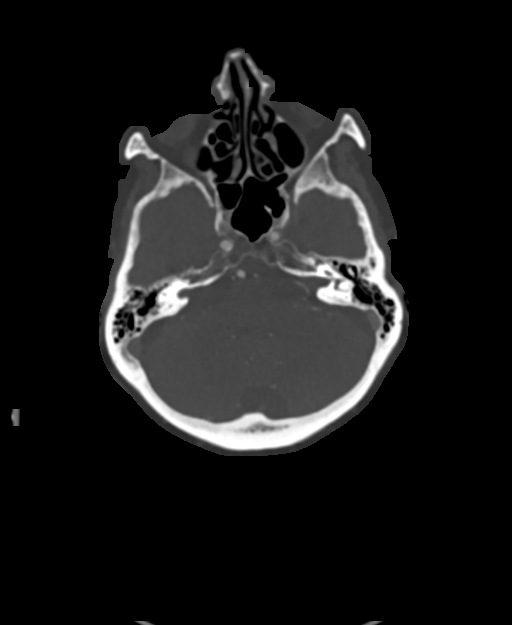
[im 291/350  soft-tissue]
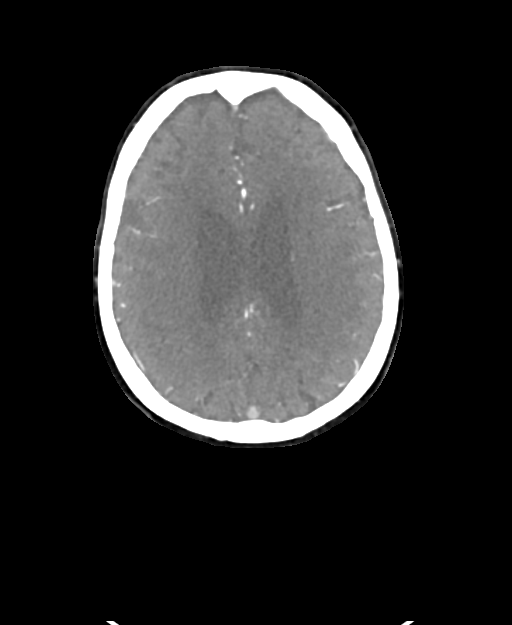

[5 of 33 positions shown; findings below may reference images not displayed]

FINDINGS: CT HEAD FINDINGS

Brain: No evidence of interval acute large vascular territory
infarction, hemorrhage, hydrocephalus, extra-axial collection or
mass lesion/mass effect. Known acute/subacute infarcts in the right
parieto-occipital lobe were better characterized on recent MRI.
Patchy white matter hypoattenuation, compatible with chronic
microvascular ischemic disease.

Vascular: Calcific atherosclerosis.

Skull: No acute fracture.

Sinuses/orbits: Visualized sinuses are clear.  Unremarkable orbits.

Other: No mastoid effusions.

Review of the MIP images confirms the above findings

CTA NECK FINDINGS

Aortic arch: Great vessel origins are patent. Mild atherosclerotic
narrowing of the left subclavian artery origin

Right carotid system: No evidence of dissection, stenosis (50% or
greater) or occlusion. Mixed calcific and noncalcific
atherosclerosis at the carotid bifurcation without greater than 50%
narrowing.

Left carotid system: Mild atherosclerotic narrowing of the common
carotid artery origin. Predominantly noncalcific atherosclerosis at
the carotid bifurcation with approximately 40% stenosis of the
internal carotid artery origin.

Vertebral arteries: Left dominant. No evidence of significant
(greater than 50%) stenosis.

Skeleton: No acute findings. Mild multilevel so for coal
degenerative change.

Other neck: No mass or suspicious adenopathy

Upper chest: Fibrotic changes in bilateral upper lungs and bronchial
wall thickening.

Review of the MIP images confirms the above findings

CTA HEAD FINDINGS

Anterior circulation: Predominately calcific atherosclerosis of
bilateral cavernous and paraclinoid internal carotid arteries
without specific evidence of greater than 50% stenosis. Bilateral M1
and proximal M2 MCAs are patent without evidence of hemodynamically
significant stenosis. Mild to moderate multifocal narrowing of more
distal MCA branches, likely related to atherosclerosis. Bilateral A1
and A2 ACAs are patent without evidence of hemodynamically
significant stenosis. No aneurysm identified.

Posterior circulation: Diminutive right vertebral artery. Bilateral
intradural vertebral arteries and basilar artery are patent without
evidence of hemodynamically significant stenosis. Bilateral PCAs are
patent. Mild right P1 PCA stenosis. Moderate to severe left proximal
P2 PCA stenosis.

Venous sinuses: As permitted by contrast timing, patent.

Review of the MIP images confirms the above findings
IMPRESSION: 1. No evidence of acute intracranial abnormality.Known
acute/subacute infarcts in the right parieto-occipital lobe were
better characterized on recent MRI.
2. No large vessel occlusion.
3. Moderate to severe proximal left P2 PCA stenosis. Mild right P1
PCA stenosis.
4. Bilateral carotid bifurcation atherosclerosis with approximately
40% narrowing of the proximal left internal carotid artery.
